# Patient Record
Sex: Female | Born: 1940 | Race: Black or African American | Hispanic: No | Smoking: Never smoker
Health system: Southern US, Community
[De-identification: ages and names within clinical notes are randomized; demographics above are authoritative.]

## PROBLEM LIST (undated history)

## (undated) DIAGNOSIS — I509 Heart failure, unspecified: Secondary | ICD-10-CM

## (undated) DIAGNOSIS — E785 Hyperlipidemia, unspecified: Secondary | ICD-10-CM

## (undated) DIAGNOSIS — C801 Malignant (primary) neoplasm, unspecified: Secondary | ICD-10-CM

## (undated) DIAGNOSIS — E1351 Other specified diabetes mellitus with diabetic peripheral angiopathy without gangrene: Secondary | ICD-10-CM

## (undated) DIAGNOSIS — F259 Schizoaffective disorder, unspecified: Secondary | ICD-10-CM

## (undated) DIAGNOSIS — I639 Cerebral infarction, unspecified: Secondary | ICD-10-CM

## (undated) DIAGNOSIS — I10 Essential (primary) hypertension: Secondary | ICD-10-CM

## (undated) DIAGNOSIS — E119 Type 2 diabetes mellitus without complications: Secondary | ICD-10-CM

## (undated) HISTORY — DX: Cerebral infarction, unspecified: I63.9

## (undated) HISTORY — DX: Schizoaffective disorder, unspecified: F25.9

## (undated) HISTORY — PX: CHOLECYSTECTOMY: SHX55

## (undated) HISTORY — DX: Other specified diabetes mellitus with diabetic peripheral angiopathy without gangrene: E13.51

## (undated) HISTORY — DX: Essential (primary) hypertension: I10

## (undated) HISTORY — DX: Hyperlipidemia, unspecified: E78.5

---
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
## 2017-08-20 ENCOUNTER — Other Ambulatory Visit: Payer: Self-pay | Admitting: Internal Medicine

## 2017-08-20 DIAGNOSIS — N6320 Unspecified lump in the left breast, unspecified quadrant: Secondary | ICD-10-CM

## 2017-08-30 ENCOUNTER — Other Ambulatory Visit: Payer: Self-pay | Admitting: Internal Medicine

## 2017-08-30 ENCOUNTER — Other Ambulatory Visit: Payer: Self-pay | Admitting: Nurse Practitioner

## 2017-08-30 DIAGNOSIS — N6320 Unspecified lump in the left breast, unspecified quadrant: Secondary | ICD-10-CM

## 2017-08-31 ENCOUNTER — Other Ambulatory Visit: Payer: Self-pay | Admitting: Internal Medicine

## 2017-08-31 ENCOUNTER — Ambulatory Visit
Admission: RE | Admit: 2017-08-31 | Discharge: 2017-08-31 | Disposition: A | Discharge status: Home / Self Care | Payer: Medicare Other | Source: Ambulatory Visit | Attending: Internal Medicine | Admitting: Internal Medicine

## 2017-08-31 DIAGNOSIS — N6320 Unspecified lump in the left breast, unspecified quadrant: Secondary | ICD-10-CM

## 2017-09-05 ENCOUNTER — Ambulatory Visit
Admission: RE | Admit: 2017-09-05 | Discharge: 2017-09-05 | Disposition: A | Discharge status: Home / Self Care | Payer: Medicare Other | Source: Ambulatory Visit | Attending: Internal Medicine | Admitting: Internal Medicine

## 2017-09-05 DIAGNOSIS — N6320 Unspecified lump in the left breast, unspecified quadrant: Secondary | ICD-10-CM

## 2017-09-07 ENCOUNTER — Encounter: Payer: Self-pay | Admitting: *Deleted

## 2017-09-07 ENCOUNTER — Telehealth: Payer: Self-pay | Admitting: Oncology

## 2017-09-07 DIAGNOSIS — Z170 Estrogen receptor positive status [ER+]: Principal | ICD-10-CM

## 2017-09-07 DIAGNOSIS — C50212 Malignant neoplasm of upper-inner quadrant of left female breast: Secondary | ICD-10-CM | POA: Insufficient documentation

## 2017-09-07 NOTE — Telephone Encounter (Signed)
Spoke with patient's daughter [name] to confirm afternoon [facility] appointment for 8/7, packet will be mailed and emailed

## 2017-09-11 NOTE — Progress Notes (Signed)
[place]  Telephone:([phone]) [phone] Fax:(336) [phone]     ID: Carolyn Parrish DOB: 02/26/1940  MR#: [id number]  LYY#:[id number]  Patient Care Team: Garwin Brothers, MD as PCP - General (Internal Medicine) Erroll Luna, MD as Consulting Physician (General Surgery) Kaytlin Burklow, Virgie Dad, MD as Consulting Physician (Oncology) Kyung Rudd, MD as Consulting Physician (Radiation Oncology) OTHER MD:  CHIEF COMPLAINT: Estrogen receptor positive breast cancer  CURRENT TREATMENT: Awaiting definitive surgery  HISTORY OF CURRENT ILLNESS: Carolyn Parrish was found to have a palpable mass in the left breast. She underwent bilateral diagnostic mammography with tomography and left breast ultrasonography at [facility] on 08/31/2017 showing: breast density category C. There was a suspicious mass in the left breast at the 9 o'clock retroareolar location involving the medial aspect of the nipple, measuring 2.8 x 1.6 x 1.0 cm. Sonographic evaluation of the left axilla demonstrates no suspicious lymphadenopathy. Limited mammographic evaluation of the right breast is unremarkable.  Accordingly on 09/05/2017 she proceeded to biopsy of the left breast area in question. The pathology from this procedure showed ([id number]): Invasive ductal carcinoma grade 2. Perineural invasion is identified. Prognostic indicators significant for: estrogen receptor, 100% positive and progesterone receptor, 80% positive, both with strong staining intensity. Proliferation marker Ki67 at 15%. HER2 not amplified with ratios HER2/CEP17 signals 1.70 and average HER2 copies per cell 4.75.   The patient's subsequent history is as detailed below.  INTERVAL HISTORY: Carolyn Parrish was evaluated in the multidisciplinary breast cancer clinic on 09/11/2017 accompanied by her daughter Carolyn Parrish. Her case was also presented at the multidisciplinary breast cancer conference on the same day. At that time a preliminary plan was  proposed: Mastectomy, with no sentinel lymph node sampling, and antiestrogens.  Adjuvant radiation was felt likely not feasible given the patient's physical constraints.   REVIEW OF SYSTEMS: The patient's daughter notes that Carolyn Parrish has diabetes and takes metformin.  The patient is wheelchair-bound.  The patient denies unusual headaches, visual changes, nausea, vomiting, stiff neck, dizziness, or gait imbalance. There has been no cough, phlegm production, or pleurisy, no chest pain or pressure, and no change in bowel or bladder habits. The patient denies fever, rash, bleeding, unexplained fatigue or unexplained weight loss. A detailed review of systems was otherwise entirely negative.   PAST MEDICAL HISTORY: Past Medical History:  Diagnosis Date  . Heart failure ([place])   . Hyperlipidemia   . Hypertension   . Schizoaffective disorder ([place])   . Stroke [facility])   Polyosteoarthrtis and generalized muscle weakness.  Diabetes- takes metformin  PAST SURGICAL HISTORY: No past surgical history on file.  Cholecystectomy 2016  FAMILY HISTORY History reviewed. No pertinent family history. The patient's father died of old age at 79. The patient's mother also died of old age at 24. The patient has 5 brothers and 6 sisters. The patient denies a family history of breast or ovarian cancer.  GYNECOLOGIC HISTORY:  No LMP recorded. Age at first live birth: 77 years old She is [place] P4 (the last was still born).  Her LMP was age [age]. She never took HRT or oral contraception.    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 The patient is widowed. The patient's daughter, Carolyn Parrish lives in [place], [place] and works as a Freight forwarder. The patient's son, Carolyn Parrish lives in [place], [place] and works as a Administrator. The patient's daughter, Carolyn Parrish lives in [place] and works as a Customer service manager. The  patient as 3 grandchildren. She is a Psychologist, forensic.      ADVANCED  DIRECTIVES: The patient's daughter, Carolyn Parrish is her HCPOA, and she can be reached at [phone]   HEALTH MAINTENANCE: Social History   Tobacco Use  . Smoking status: Never Smoker  Substance Use Topics  . Alcohol use: Not Currently  . Drug use: Never     Colonoscopy:   PAP:   Bone density:    Allergies  Allergen Reactions  . Atorvastatin     Current Outpatient Medications  Medication Sig Dispense Refill  . acetaminophen (TYLENOL) 325 MG tablet Take 650 mg by mouth every 4 (four) hours as needed (Arthritis pain). Give 2 tablets by mouth every 4 hours as needed for arthritis pain.    Marland Kitchen alum & mag hydroxide-simeth (MYLANTA) 200-200-20 MG/5ML suspension Take 30 mLs by mouth every 4 (four) hours as needed for indigestion or heartburn.    Marland Kitchen ascorbic acid (VITAMIN C) 500 MG tablet Take 500 mg by mouth 2 (two) times daily.    Marland Kitchen aspirin 81 MG chewable tablet Chew 81 mg by mouth daily.    . divalproex (DEPAKOTE ER) 250 MG 24 hr tablet Take 250 mg by mouth daily.    Marland Kitchen docusate sodium (COLACE) 100 MG capsule Take 100 mg by mouth 2 (two) times daily.    . furosemide (LASIX) 20 MG tablet Take 20 mg by mouth. Give 0.5 tablet by mouth one time a day.    . lisinopril (PRINIVIL,ZESTRIL) 2.5 MG tablet Take 2.5 mg by mouth daily.    . magnesium hydroxide (MILK OF MAGNESIA) 400 MG/5ML suspension Take 30 mLs by mouth daily as needed for mild constipation.    . metFORMIN (GLUCOPHAGE) 500 MG tablet Take 500 mg by mouth daily.    . polyethylene glycol (MIRALAX / GLYCOLAX) packet Take 17 g by mouth daily.    . vitamin B-12 (CYANOCOBALAMIN) 500 MCG tablet Take 500 mcg by mouth daily.    . Vitamin D, Ergocalciferol, (DRISDOL) 50000 units CAPS capsule Take 50,000 Units by mouth every 7 (seven) days.     No current facility-administered medications for this visit.     OBJECTIVE: Elderly African-American woman examined in a wheelchair  Vitals:   09/12/17 1244  BP: 102/71  Pulse: (!) 108  Resp: 18   Temp: 98.9 F (37.2 C)     There is no height or weight on file to calculate BMI.   Wt Readings from Last 3 Encounters:  No data found for Wt      ECOG FS:2 - Symptomatic, <50% confined to bed  Ocular: Sclerae unicteric, arcus senilis Lymphatic: No cervical or supraclavicular adenopathy Lungs no rales or rhonchi Heart regular rate and rhythm Abd soft, nontender, positive bowel sounds MSK no focal spinal tenderness, no joint edema Neuro: non-focal, well-oriented, appropriate affect Breasts: The right breast is unremarkable.  The left breast is status post recent biopsy.  I do not palpate a well-defined mass.  There are no skin or nipple changes of concern.  Both axillae are benign.   LAB RESULTS:  CMP     Component Value Date/Time   NA 142 09/12/2017 1132   K 4.3 09/12/2017 1132   CL 102 09/12/2017 1132   CO2 27 09/12/2017 1132   GLUCOSE 138 (H) 09/12/2017 1132   BUN 14 09/12/2017 1132   CREATININE 0.90 09/12/2017 1132   CALCIUM 10.1 09/12/2017 1132   PROT 8.4 (H) 09/12/2017 1132   ALBUMIN 3.7 09/12/2017 1132  AST 17 09/12/2017 1132   ALT 26 09/12/2017 1132   ALKPHOS 83 09/12/2017 1132   BILITOT 0.5 09/12/2017 1132   GFRNONAA >60 09/12/2017 1132   GFRAA >60 09/12/2017 1132    No results found for: TOTALPROTELP, ALBUMINELP, A1GS, A2GS, BETS, BETA2SER, GAMS, MSPIKE, SPEI  No results found for: KPAFRELGTCHN, LAMBDASER, KAPLAMBRATIO  Lab Results  Component Value Date   WBC 14.4 (H) 09/12/2017   NEUTROABS 9.2 (H) 09/12/2017   HGB 15.9 09/12/2017   HCT 50.1 (H) 09/12/2017   MCV 97.3 09/12/2017   PLT 326 09/12/2017    _0 @  No results found for: LABCA2  No components found for: YFVCBS496  No results for input(s): INR in the last 168 hours.  No results found for: LABCA2  No results found for: PRF163  No results found for: WGY659  No results found for: DJT701  No results found for: CA2729  No components found for: HGQUANT  No results  found for: CEA1 / No results found for: CEA1   No results found for: AFPTUMOR  No results found for: CHROMOGRNA  No results found for: PSA1  Appointment on 09/12/2017  Component Date Value Ref Range Status  . WBC Count 09/12/2017 14.4* 3.9 - 10.3 K/uL Final  . RBC 09/12/2017 5.15  3.70 - 5.45 MIL/uL Final  . Hemoglobin 09/12/2017 15.9  11.6 - 15.9 g/dL Final  . HCT 09/12/2017 50.1* 34.8 - 46.6 % Final  . MCV 09/12/2017 97.3  79.5 - 101.0 fL Final  . MCH 09/12/2017 30.9  25.1 - 34.0 pg Final  . MCHC 09/12/2017 31.7  31.5 - 36.0 g/dL Final  . RDW 09/12/2017 16.2* 11.2 - 14.5 % Final  . Platelet Count 09/12/2017 326  145 - 400 K/uL Final  . Neutrophils Relative % 09/12/2017 64  % Final  . Neutro Abs 09/12/2017 9.2* 1.5 - 6.5 K/uL Final  . Lymphocytes Relative 09/12/2017 26  % Final  . Lymphs Abs 09/12/2017 3.8* 0.9 - 3.3 K/uL Final  . Monocytes Relative 09/12/2017 9  % Final  . Monocytes Absolute 09/12/2017 1.3* 0.1 - 0.9 K/uL Final  . Eosinophils Relative 09/12/2017 1  % Final  . Eosinophils Absolute 09/12/2017 0.1  0.0 - 0.5 K/uL Final  . Basophils Relative 09/12/2017 0  % Final  . Basophils Absolute 09/12/2017 0.0  0.0 - 0.1 K/uL Final   Performed at [facility], [place] [address], [place], [place] [address]  . Sodium 09/12/2017 142  135 - 145 mmol/L Final  . Potassium 09/12/2017 4.3  3.5 - 5.1 mmol/L Final  . Chloride 09/12/2017 102  98 - 111 mmol/L Final  . CO2 09/12/2017 27  22 - 32 mmol/L Final  . Glucose, Bld 09/12/2017 138* 70 - 99 mg/dL Final  . BUN 09/12/2017 14  8 - 23 mg/dL Final  . Creatinine 09/12/2017 0.90  0.44 - 1.00 mg/dL Final  . Calcium 09/12/2017 10.1  8.9 - 10.3 mg/dL Final  . Total Protein 09/12/2017 8.4* 6.5 - 8.1 g/dL Final  . Albumin 09/12/2017 3.7  3.5 - 5.0 g/dL Final  . AST 09/12/2017 17  15 - 41 U/L Final  . ALT 09/12/2017 26  0 - 44 U/L Final  . Alkaline Phosphatase 09/12/2017 83  38 - 126 U/L Final  . Total Bilirubin  09/12/2017 0.5  0.3 - 1.2 mg/dL Final  . GFR, Est Non Af Am 09/12/2017 >60  >60 mL/min Final  . GFR, Est AFR Am 09/12/2017 >60  >  60 mL/min Final   Comment: (NOTE) The eGFR has been calculated using the CKD EPI equation. This calculation has not been validated in all clinical situations. eGFR's persistently <60 mL/min signify possible Chronic Kidney Disease.   Georgiann Hahn gap 09/12/2017 13  5 - 15 Final   Performed at [facility], [place] [address], [place], [place] [address]    (this displays the last labs from the last 3 days)  No results found for: TOTALPROTELP, ALBUMINELP, A1GS, A2GS, BETS, BETA2SER, GAMS, MSPIKE, SPEI (this displays SPEP labs)  No results found for: KPAFRELGTCHN, LAMBDASER, KAPLAMBRATIO (kappa/lambda light chains)  No results found for: HGBA, HGBA2QUANT, HGBFQUANT, HGBSQUAN (Hemoglobinopathy evaluation)   No results found for: LDH  No results found for: IRON, TIBC, IRONPCTSAT (Iron and TIBC)  No results found for: FERRITIN  Urinalysis No results found for: COLORURINE, APPEARANCEUR, LABSPEC, PHURINE, GLUCOSEU, HGBUR, BILIRUBINUR, KETONESUR, PROTEINUR, UROBILINOGEN, NITRITE, LEUKOCYTESUR   STUDIES: US Breast Ltd Uni Left Inc Axilla  Result Date: 08/31/2017 CLINICAL DATA:  77 year old female with a palpable left breast abnormality. Of note the patient has markedly limited mobility status post stroke and is wheelchair bound. The best possible images were obtained. EXAM: DIGITAL DIAGNOSTIC BILATERAL MAMMOGRAM WITH CAD AND TOMO ULTRASOUND LEFT BREAST COMPARISON:  Previous exam(s). ACR Breast Density Category c: The breast tissue is heterogeneously dense, which may obscure small masses. FINDINGS: Limited bilateral MLO images were obtained. CC images could not be performed due to the patient's physical limitations and body habitus. A spiculated hyperdense mass is identified in the superficial subareolar left breast. It localizes medially on  tomosynthesis and demonstrates nipple in skin involvement. Limited evaluation of the right breast is unremarkable. Mammographic images were processed with CAD. On physical exam, I palpate a hard, immobile 2-3 cm mass in the medial periareolar region. The lump is visible within the skin and appears to involve the nipple. Targeted ultrasound is performed, showing an irregular hypoechoic mass with associated vascularity at the 9 o'clock retroareolar region. It measures approximately 2.8 x 1.6 x 1.0 cm. This mass abuts the overlying skin surface and involves the medial aspect of the nipple. Evaluation of the left axilla demonstrates no suspicious lymphadenopathy. IMPRESSION: 1. Suspicious left breast mass involving the superficial periareolar tissues and medial aspect of the left nipple. Recommendation is for ultrasound-guided biopsy. 2. No suspicious left axillary lymphadenopathy. 3. Limited mammographic evaluation of the right breast is grossly unremarkable. RECOMMENDATION: Ultrasound-guided biopsy for a suspicious periareolar left breast mass. I have discussed the findings and recommendations with the patient. Results were also provided in writing at the conclusion of the visit. If applicable, a reminder letter will be sent to the patient regarding the next appointment. BI-RADS CATEGORY  4: Suspicious. Electronically Signed   By: Kristopher Oppenheim M.D.   On: 08/31/2017 11:49   Mm Diag Breast Tomo Bilateral  Result Date: 08/31/2017 CLINICAL DATA:  77 year old female with a palpable left breast abnormality. Of note the patient has markedly limited mobility status post stroke and is wheelchair bound. The best possible images were obtained. EXAM: DIGITAL DIAGNOSTIC BILATERAL MAMMOGRAM WITH CAD AND TOMO ULTRASOUND LEFT BREAST COMPARISON:  Previous exam(s). ACR Breast Density Category c: The breast tissue is heterogeneously dense, which may obscure small masses. FINDINGS: Limited bilateral MLO images were obtained. CC  images could not be performed due to the patient's physical limitations and body habitus. A spiculated hyperdense mass is identified in the superficial subareolar left breast. It localizes medially on tomosynthesis and demonstrates  nipple in skin involvement. Limited evaluation of the right breast is unremarkable. Mammographic images were processed with CAD. On physical exam, I palpate a hard, immobile 2-3 cm mass in the medial periareolar region. The lump is visible within the skin and appears to involve the nipple. Targeted ultrasound is performed, showing an irregular hypoechoic mass with associated vascularity at the 9 o'clock retroareolar region. It measures approximately 2.8 x 1.6 x 1.0 cm. This mass abuts the overlying skin surface and involves the medial aspect of the nipple. Evaluation of the left axilla demonstrates no suspicious lymphadenopathy. IMPRESSION: 1. Suspicious left breast mass involving the superficial periareolar tissues and medial aspect of the left nipple. Recommendation is for ultrasound-guided biopsy. 2. No suspicious left axillary lymphadenopathy. 3. Limited mammographic evaluation of the right breast is grossly unremarkable. RECOMMENDATION: Ultrasound-guided biopsy for a suspicious periareolar left breast mass. I have discussed the findings and recommendations with the patient. Results were also provided in writing at the conclusion of the visit. If applicable, a reminder letter will be sent to the patient regarding the next appointment. BI-RADS CATEGORY  4: Suspicious. Electronically Signed   By: Kristopher Oppenheim M.D.   On: 08/31/2017 11:49   [place] Lt Breast Bx W Loc Dev 1st Lesion Img Bx Spec US Guide  Addendum Date: 09/06/2017   ADDENDUM REPORT: 09/06/2017 12:19 ADDENDUM: Pathology revealed GRADE II INVASIVE DUCTAL CARCINOMA, PERINEURAL INVASION IS IDENTIFIED of LEFT breast, retroareolar 9 o'clock axis. This was found to be concordant by Dr. Franki Cabot. Pathology results were  discussed with the patient's daughter and Power of Doyce Para, by telephone. Kennith Gain reported the patient did well after the biopsy with tenderness at the site. Post biopsy instructions and care were reviewed and questions were answered. Kennith Gain was encouraged to call [facility] for any additional concerns. The patient was referred to [facility] on September 12, 2017. Pathology results reported by Roselind Messier, RN on 09/06/2017. Electronically Signed   By: Franki Cabot M.D.   On: 09/06/2017 12:19   Result Date: 09/06/2017 CLINICAL DATA:  Patient with a retroareolar LEFT breast mass presents today for ultrasound-guided core biopsy. EXAM: ULTRASOUND GUIDED LEFT BREAST CORE NEEDLE BIOPSY COMPARISON:  Previous exam(s). PROCEDURE: I met with the patient and we discussed the procedure of ultrasound-guided biopsy, including benefits and alternatives. We discussed the high likelihood of a successful procedure. We discussed the risks of the procedure including infection, bleeding, tissue injury, clip migration, and inadequate sampling. Informed written consent was given. The usual time-out protocol was performed immediately prior to the procedure. Lesion quadrant: Retroareolar 9 o'clock axis. Using sterile technique and 1% Lidocaine as local anesthetic, under direct ultrasound visualization, a 14 gauge spring-loaded device was used to perform biopsy of the retroareolar LEFT breast mass, 9 o'clock axis,using a inferior approach. At the conclusion of the procedure, a ribbon shaped tissue marker clip was deployed into the biopsy cavity. Postprocedure ultrasound shows the clip well-positioned within the retroareolar mass. IMPRESSION: Ultrasound-guided biopsy of the retroareolar LEFT breast mass. No apparent complications. Electronically Signed: By: Franki Cabot M.D. On: 09/05/2017 13:15    ELIGIBLE FOR  AVAILABLE RESEARCH PROTOCOL: no  ASSESSMENT: 77 y.o. [place] skilled nursing facility resident status post left breast upper outer quadrant biopsy 09/05/2017 for a clinical T2N0, stage Ib invasive ductal carcinoma, grade 2, estrogen and progesterone receptor positive, HER-2 not amplified, with an MIB-1 of 15%.  PLAN: We spent  more than 50% of today's hour-long appointment in counseling and coordination of care regarding the biology of the patient's diagnosis and the specifics of her situation. We first reviewed the fact that cancer is not one disease but more than 100 different diseases and that it is important to keep them separate-- otherwise when friends and relatives discuss their own cancer experiences with Rayme confusion can result. Similarly we explained that if breast cancer spreads to the bone or liver, the patient would not have bone cancer or liver cancer, but breast cancer in the bone and breast cancer in the liver: one cancer in three places-- not 3 different cancers which otherwise would have to be treated in 3 different ways.  We discussed the difference between local and systemic therapy. In terms of loco-regional treatment, lumpectomy plus radiation is equivalent to mastectomy as far as survival is concerned.  However, in the lowest his case, given particularly the difficulty with screening mammography, simple mastectomy is recommended.  Next we went over the options for systemic therapy which are anti-estrogens, anti-HER-2 immunotherapy, and chemotherapy. Nuha does not meet criteria for anti-HER-2 immunotherapy. She is a good candidate for anti-estrogens.  The question of chemotherapy is more complicated. Chemotherapy is most effective in rapidly growing, aggressive tumors. It is much less effective in not high-grade, slow growing cancers, like Taesha 's. For that reason and in view of the patient's age and comorbidities we are going to forego Oncotype testing and proceed from  surgery to adjuvant antiestrogens.  The patient will return to see me once she recovers from surgery and we will plan to start anastrozole at that time.  Marlina Cataldi, Virgie Dad, MD  09/12/17 4:09 PM Medical Oncology and Hematology [facility] [address] [place], [place] [address] Tel. [phone]    Fax. [phone]  Alice Rieger, am acting as scribe for Chauncey Cruel MD.  I, Lurline Del MD, have reviewed the above documentation for accuracy and completeness, and I agree with the above.

## 2017-09-12 ENCOUNTER — Encounter: Payer: Self-pay | Admitting: Radiation Oncology

## 2017-09-12 ENCOUNTER — Ambulatory Visit: Payer: Medicare Other | Admitting: Physical Therapy

## 2017-09-12 ENCOUNTER — Ambulatory Visit
Admission: RE | Admit: 2017-09-12 | Discharge: 2017-09-12 | Disposition: A | Discharge status: Home / Self Care | Payer: Medicare Other | Source: Ambulatory Visit | Attending: Radiation Oncology | Admitting: Radiation Oncology

## 2017-09-12 ENCOUNTER — Inpatient Hospital Stay: Payer: Medicare Other | Attending: Oncology | Admitting: Oncology

## 2017-09-12 ENCOUNTER — Inpatient Hospital Stay: Payer: Medicare Other

## 2017-09-12 ENCOUNTER — Ambulatory Visit: Payer: Self-pay | Admitting: Surgery

## 2017-09-12 ENCOUNTER — Encounter: Payer: Self-pay | Admitting: Oncology

## 2017-09-12 VITALS — BP 102/71 | HR 108 | Temp 98.9°F | Resp 18 | Ht 70.0 in

## 2017-09-12 DIAGNOSIS — Z8673 Personal history of transient ischemic attack (TIA), and cerebral infarction without residual deficits: Secondary | ICD-10-CM | POA: Diagnosis not present

## 2017-09-12 DIAGNOSIS — C50212 Malignant neoplasm of upper-inner quadrant of left female breast: Secondary | ICD-10-CM

## 2017-09-12 DIAGNOSIS — Z452 Encounter for adjustment and management of vascular access device: Secondary | ICD-10-CM | POA: Insufficient documentation

## 2017-09-12 DIAGNOSIS — C50112 Malignant neoplasm of central portion of left female breast: Secondary | ICD-10-CM | POA: Insufficient documentation

## 2017-09-12 DIAGNOSIS — F259 Schizoaffective disorder, unspecified: Secondary | ICD-10-CM | POA: Diagnosis not present

## 2017-09-12 DIAGNOSIS — I10 Essential (primary) hypertension: Secondary | ICD-10-CM

## 2017-09-12 DIAGNOSIS — I6359 Cerebral infarction due to unspecified occlusion or stenosis of other cerebral artery: Secondary | ICD-10-CM | POA: Diagnosis not present

## 2017-09-12 DIAGNOSIS — E1151 Type 2 diabetes mellitus with diabetic peripheral angiopathy without gangrene: Secondary | ICD-10-CM | POA: Insufficient documentation

## 2017-09-12 DIAGNOSIS — I110 Hypertensive heart disease with heart failure: Secondary | ICD-10-CM | POA: Diagnosis not present

## 2017-09-12 DIAGNOSIS — Z170 Estrogen receptor positive status [ER+]: Secondary | ICD-10-CM | POA: Diagnosis not present

## 2017-09-12 DIAGNOSIS — Z79899 Other long term (current) drug therapy: Secondary | ICD-10-CM | POA: Insufficient documentation

## 2017-09-12 DIAGNOSIS — E119 Type 2 diabetes mellitus without complications: Secondary | ICD-10-CM | POA: Diagnosis not present

## 2017-09-12 DIAGNOSIS — Z993 Dependence on wheelchair: Secondary | ICD-10-CM | POA: Diagnosis not present

## 2017-09-12 DIAGNOSIS — F258 Other schizoaffective disorders: Secondary | ICD-10-CM | POA: Diagnosis not present

## 2017-09-12 DIAGNOSIS — Z7982 Long term (current) use of aspirin: Secondary | ICD-10-CM | POA: Insufficient documentation

## 2017-09-12 DIAGNOSIS — Z7189 Other specified counseling: Secondary | ICD-10-CM

## 2017-09-12 DIAGNOSIS — I639 Cerebral infarction, unspecified: Secondary | ICD-10-CM | POA: Insufficient documentation

## 2017-09-12 LAB — CBC WITH DIFFERENTIAL (CANCER CENTER ONLY)
BASOS PCT: 0 %
Basophils Absolute: 0 10*3/uL (ref 0.0–0.1)
EOS PCT: 1 %
Eosinophils Absolute: 0.1 10*3/uL (ref 0.0–0.5)
HCT: 50.1 % — ABNORMAL HIGH (ref 34.8–46.6)
HEMOGLOBIN: 15.9 g/dL (ref 11.6–15.9)
Lymphocytes Relative: 26 %
Lymphs Abs: 3.8 10*3/uL — ABNORMAL HIGH (ref 0.9–3.3)
MCH: 30.9 pg (ref 25.1–34.0)
MCHC: 31.7 g/dL (ref 31.5–36.0)
MCV: 97.3 fL (ref 79.5–101.0)
MONOS PCT: 9 %
Monocytes Absolute: 1.3 10*3/uL — ABNORMAL HIGH (ref 0.1–0.9)
NEUTROS PCT: 64 %
Neutro Abs: 9.2 10*3/uL — ABNORMAL HIGH (ref 1.5–6.5)
PLATELETS: 326 10*3/uL (ref 145–400)
RBC: 5.15 MIL/uL (ref 3.70–5.45)
RDW: 16.2 % — ABNORMAL HIGH (ref 11.2–14.5)
WBC: 14.4 10*3/uL — AB (ref 3.9–10.3)

## 2017-09-12 LAB — CMP (CANCER CENTER ONLY)
ALBUMIN: 3.7 g/dL (ref 3.5–5.0)
ALT: 26 U/L (ref 0–44)
AST: 17 U/L (ref 15–41)
Alkaline Phosphatase: 83 U/L (ref 38–126)
Anion gap: 13 (ref 5–15)
BILIRUBIN TOTAL: 0.5 mg/dL (ref 0.3–1.2)
BUN: 14 mg/dL (ref 8–23)
CALCIUM: 10.1 mg/dL (ref 8.9–10.3)
CO2: 27 mmol/L (ref 22–32)
CREATININE: 0.9 mg/dL (ref 0.44–1.00)
Chloride: 102 mmol/L (ref 98–111)
GFR, Est AFR Am: >60 mL/min (ref >60)
Glucose, Bld: 138 mg/dL — ABNORMAL HIGH (ref 70–99)
Potassium: 4.3 mmol/L (ref 3.5–5.1)
SODIUM: 142 mmol/L (ref 135–145)
TOTAL PROTEIN: 8.4 g/dL — AB (ref 6.5–8.1)

## 2017-09-12 NOTE — H&P (Signed)
elois Parrish Carolyn Documented: 09/12/2017 7:37 AM Location: [facility] Patient #: [id number] DOB: [birth year] Undefined / Language: [name] / Race: Black or African American Female  History of Present Illness Carolyn Moores A. Shaden Higley MD; 09/12/2017 4:44 PM) Patient words: Pt here at the request ofthe [facility] for left breast mass core bx to be IDC ER / PR = her 2 neu negative    CLINICAL DATA: 77 year old female with a palpable left breast abnormality. Of note the patient has markedly limited mobility status post stroke and is wheelchair bound. The best possible images were obtained.  EXAM: DIGITAL DIAGNOSTIC BILATERAL MAMMOGRAM WITH CAD AND TOMO  ULTRASOUND LEFT BREAST  COMPARISON: Previous exam(s).  ACR Breast Density Category c: The breast tissue is heterogeneously dense, which may obscure small masses.  FINDINGS: Limited bilateral MLO images were obtained. CC images could not be performed due to the patient's physical limitations and body habitus. A spiculated hyperdense mass is identified in the superficial subareolar left breast. It localizes medially on tomosynthesis and demonstrates nipple in skin involvement.  Limited evaluation of the right breast is unremarkable.  Mammographic images were processed with CAD.  On physical exam, I palpate a hard, immobile 2-3 cm mass in the medial periareolar region. The lump is visible within the skin and appears to involve the nipple.  Targeted ultrasound is performed, showing an irregular hypoechoic mass with associated vascularity at the 9 o'clock retroareolar region. It measures approximately 2.8 x 1.6 x 1.0 cm. This mass abuts the overlying skin surface and involves the medial aspect of the nipple.  Evaluation of the left axilla demonstrates no suspicious lymphadenopathy.  IMPRESSION: 1. Suspicious left breast mass involving the superficial periareolar tissues and medial aspect of the left nipple. Recommendation is  for ultrasound-guided biopsy. 2. No suspicious left axillary lymphadenopathy. 3. Limited mammographic evaluation of the right breast is grossly unremarkable.  RECOMMENDATION: Ultrasound-guided biopsy for a suspicious periareolar left breast mass.  I have discussed the findings and recommendations with the patient. Results were also provided in writing at the conclusion of the visit. If applicable, a reminder letter will be sent to the patient regarding the next appointment.  BI-RADS CATEGORY 4: Suspicious.   Electronically Signed By: Kristopher Oppenheim M.Parrish. 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 HER2 FISH was performed by a technologist and cell imaging and analysis on the [employer]. First Analysis: RATIO OF HER2/CEP17 SIGNALS 1.70 AVERAGE HER2 COPY NUMBER PER CELL 4.75 An additional 20 cells was analyzed by FISH in a separate region with invasive cancer. Additional Analysis: RATIO OF HER2/CEP17 SIGNALS 1.89 AVERAGE HER2 COPY NUMBER PER CELL 4.93 The ratio of HER-2 CEP 17 is within the range < 2.0 of HER-2: CEP17 in normal breast tissue and a copy number of HER2 signals per cell is >=4.0 and < 6.0. Arch Pathol Lab Med 1:1,2018 COMMENT: It is uncertain whether patients with >=4.0 and < 6.0 average HER2 signals/cell and HER2/CEP17 ratio < 2.0 benefit from HER2 targeted therapy in the absence of protein overexpression (IHC 3+). If the specimen test result is close to the ISH ratio threshold for positive, there is a high likelihood that repeat testing will result in different results by chance alone. Therefore, when [facility] results are not 3+ positive, it is recommended  that the sample be considered HER2 negative without additional testing on the same  specimen. Thressa Sheller MD Pathologist, Electronic Signature ( Signed 09/11/2017) By immunohistochemistry, the tumor cells are equivocal for Her2 (2+). Her2 FISH has been ordered. 1 of 3 FINAL for Carolyn Parrish, Carolyn Parrish ([id number]) ADDITIONAL INFORMATION:(continued) Vicente Males MD Pathologist, Electronic Signature ( Signed 09/11/2017) PROGNOSTIC INDICATORS Results: IMMUNOHISTOCHEMICAL AND MORPHOMETRIC ANALYSIS PERFORMED MANUALLY Estrogen Receptor: 100%, POSITIVE, STRONG STAINING INTENSITY Progesterone Receptor: 80%, POSITIVE, STRONG STAINING INTENSITY Proliferation Marker Ki67: 15% REFERENCE RANGE ESTROGEN RECEPTOR NEGATIVE 0% POSITIVE =>1% REFERENCE RANGE PROGESTERONE RECEPTOR NEGATIVE 0% POSITIVE =>1% All controls stained appropriately Thressa Sheller MD Pathologist, Electronic Signature ( Signed 09/10/2017) FINAL DIAGNOSIS Diagnosis Breast, left, needle core biopsy, 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 - SEE COMMENT. Microscopic Comment The carcinoma appears grade II. A breast prognostic profile will be performed and the results reported separately. The results were called to the [facility] on 09/06/17. (JBK:kh 09/06/17) Enid Cutter MD Pathologist, Electronic Signature (Case signed 09/06/2017) Specimen Gross and Clinical Information Specimen Comment In formalin 1:10, extracted less than 1 minute, left breast mass, likely involving overlying skin/nipple 2 of 3 FINAL for Carolyn Parrish, Carolyn Parrish ([id number].  The patient is a 77 year old female.   Past Surgical History Tawni Pummel, RN; 09/12/2017 7:37 AM) Breast Biopsy Left.  Diagnostic Studies History Tawni Pummel, RN; 09/12/2017 7:37 AM) Mammogram within last year Pap Smear >5 years ago  Medication History Tawni Pummel, RN; 09/12/2017 7:38 AM) Medications Reconciled  Social History Tawni Pummel, RN; 09/12/2017 7:37 AM) Caffeine use Tea. No drug  use  Family History Tawni Pummel, RN; 09/12/2017 7:37 AM) Family history unknown First Degree Relatives  Pregnancy / Birth History Tawni Pummel, RN; 09/12/2017 7:37 AM) [name]  Other Problems Tawni Pummel, RN; 09/12/2017 7:37 AM) Cholelithiasis High blood pressure     Review of Systems (Carolyn Parrish A. Mayce Noyes MD; 09/12/2017 4:40 PM) General Present- Appetite Loss. Not Present- Chills, Fatigue, Fever, Night Sweats, Weight Gain and Weight Loss. Skin Not Present- Change in Wart/Mole, Dryness, Hives, Jaundice, New Lesions, Non-Healing Wounds, Rash and Ulcer. HEENT Not Present- Earache, Hearing Loss, Hoarseness, Nose Bleed, Oral Ulcers, Ringing in the Ears, Seasonal Allergies, Sinus Pain, Sore Throat, Visual Disturbances, Wears glasses/contact lenses and Yellow Eyes. Respiratory Present- Chronic Cough. Not Present- Bloody sputum, Difficulty Breathing, Snoring and Wheezing. Breast Not Present- Breast Mass, Breast Pain, Nipple Discharge and Skin Changes. Gastrointestinal Present- Chronic diarrhea. Not Present- Abdominal Pain, Bloating, Bloody Stool, Change in Bowel Habits, Constipation, Difficulty Swallowing, Excessive gas, Gets full quickly at meals, Hemorrhoids, Indigestion, Nausea, Rectal Pain and Vomiting. Musculoskeletal Present- Muscle Weakness. Not Present- Back Pain, Joint Pain, Joint Stiffness, Muscle Pain and Swelling of Extremities. Neurological Present- Decreased Memory and Trouble walking. Not Present- Fainting, Headaches, Numbness, Seizures, Tingling, Tremor and Weakness. Psychiatric Present- Bipolar. Not Present- Anxiety, Change in Sleep Pattern, Depression, Fearful and Frequent crying. Endocrine Present- Hot flashes. Not Present- Cold Intolerance, Excessive Hunger, Hair Changes, Heat Intolerance and New Diabetes. Hematology Not Present- Blood Thinners, Easy Bruising, Excessive bleeding, Gland problems, HIV and Persistent Infections. All other systems negative   Physical  Exam (Charmon Thorson A. Coalton Arch MD; 09/12/2017 4:42 PM)  General Mental Status-Alert. General Appearance-Consistent with stated age. Hydration-Well hydrated. Voice-Normal. Note: wheel chair bound  Head and Neck Head-normocephalic, atraumatic with no lesions or palpable masses. Trachea-midline. Thyroid Gland Characteristics - normal size and consistency.  Eye Eyeball - Bilateral-Extraocular movements intact. Sclera/Conjunctiva - Bilateral-No scleral  icterus.  Chest and Lung Exam Chest and lung exam reveals -quiet, even and easy respiratory effort with no use of accessory muscles and on auscultation, normal breath sounds, no adventitious sounds and normal vocal resonance. Inspection Chest Wall - Normal. Back - normal.  Breast Note: large left central breast mass 3 cm no fixed right breast normal  Cardiovascular Cardiovascular examination reveals -normal heart sounds, regular rate and rhythm with no murmurs and normal pedal pulses bilaterally.  Musculoskeletal Normal Exam - Left-Upper Extremity Strength Normal and Lower Extremity Strength Normal. Normal Exam - Right-Upper Extremity Strength Normal and Lower Extremity Strength Normal.  Lymphatic Head & Neck  General Head & Neck Lymphatics: Bilateral - Description - Normal. Axillary  General Axillary Region: Bilateral - Description - Normal. Tenderness - Non Tender.    Assessment & Plan (Sheyanne Munley A. Krist Rosenboom MD; 09/12/2017 4:43 PM)  BREAST CANCER, RIGHT (C50.911) Impression: pt not a good radiation candidate recommed left mastectomy simple   Discussed treatment options for breast cancer to include breast conservation vs mastectomy with reconstruction. Pt has decided on mastectomy. Risk include bleeding, infection, flap necrosis, pain, numbness, recurrence, hematoma, other surgery needs. Pt understands and agrees to proceed.  Current Plans You are being scheduled for surgery- Our schedulers will call  you.  You should hear from our office's scheduling department within 5 working days about the location, date, and time of surgery. We try to make accommodations for patient's preferences in scheduling surgery, but sometimes the OR schedule or the surgeon's schedule prevents [place] from making those accommodations.  If you have not heard from our office [phone]) in 5 working days, call the office and ask for your surgeon's nurse.  If you have other questions about your diagnosis, plan, or surgery, call the office and ask for your surgeon's nurse.  Pt Education - [facility] Breast Cancer Information Given - [facility] "Breast Journey" Package Pt Education - [facility] Mastectomy HCI

## 2017-09-12 NOTE — Progress Notes (Signed)
Radiation Oncology         [phone] ________________________________  Name: Carolyn Parrish        MRN: [id number]  Date of Service: 09/12/2017 DOB: Apr 18, 1940  XJ:[id number], Marlene Lard, MD  Erroll Luna, MD     REFERRING PHYSICIAN: Erroll Luna, MD   DIAGNOSIS: The encounter diagnosis was Malignant neoplasm of upper-inner quadrant of left breast in female, estrogen receptor positive ([place]).   HISTORY OF PRESENT ILLNESS: Carolyn Parrish is a 77 y.o. female seen in the multidisciplinary breast clinic for a new diagnosis of left breast cancer. The patient was noted to have a palpable mass in the left breast and has a history of prior CVA and has been in an SNF since 1996 as a result of CVA history. She had a mass on diagnostic ultrasound that measured 2.8 x 1. 6 x 1 cm at the 9:00 position along the upper inner quadrant. She did not have any axillary adenopathy. 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 She comes today to discuss treatment options for her cancer.    PREVIOUS RADIATION THERAPY: No   PAST MEDICAL HISTORY:  Past Medical History:  Diagnosis Date  . Heart failure ([place])   . Hyperlipidemia   . Hypertension   . Schizoaffective disorder ([place])   . Stroke [facility])        PAST SURGICAL HISTORY:History reviewed. No pertinent surgical history.   FAMILY HISTORY: History reviewed. No pertinent family history.   SOCIAL HISTORY:  reports that she has never smoked. She does not have any smokeless tobacco history on file. She reports that she drank alcohol. She reports that she does not use drugs. The patient is widowed and her daughter is her HCPOA and accompanies her today.    ALLERGIES: Atorvastatin   MEDICATIONS:  Current Outpatient Medications  Medication Sig Dispense Refill  . acetaminophen (TYLENOL) 325 MG tablet Take 650 mg by mouth every 4 (four)  hours as needed (Arthritis pain). Give 2 tablets by mouth every 4 hours as needed for arthritis pain.    Marland Kitchen alum & mag hydroxide-simeth (MYLANTA) 200-200-20 MG/5ML suspension Take 30 mLs by mouth every 4 (four) hours as needed for indigestion or heartburn.    Marland Kitchen ascorbic acid (VITAMIN C) 500 MG tablet Take 500 mg by mouth 2 (two) times daily.    Marland Kitchen aspirin 81 MG chewable tablet Chew 81 mg by mouth daily.    . divalproex (DEPAKOTE ER) 250 MG 24 hr tablet Take 250 mg by mouth daily.    Marland Kitchen docusate sodium (COLACE) 100 MG capsule Take 100 mg by mouth 2 (two) times daily.    . furosemide (LASIX) 20 MG tablet Take 20 mg by mouth. Give 0.5 tablet by mouth one time a day.    . lisinopril (PRINIVIL,ZESTRIL) 2.5 MG tablet Take 2.5 mg by mouth daily.    . magnesium hydroxide (MILK OF MAGNESIA) 400 MG/5ML suspension Take 30 mLs by mouth daily as needed for mild constipation.    . metFORMIN (GLUCOPHAGE) 500 MG tablet Take 500 mg by mouth daily.    . polyethylene glycol (MIRALAX / GLYCOLAX) packet Take 17 g by mouth daily.    . vitamin B-12 (CYANOCOBALAMIN) 500 MCG tablet Take 500 mcg by mouth daily.    . Vitamin D, Ergocalciferol, (DRISDOL) 50000 units CAPS capsule Take 50,000 Units by mouth every 7 (seven) days.  No current facility-administered medications for this encounter.      REVIEW OF SYSTEMS: On review of systems, the patient reports that she is doing well overall. No complaints are verbalized.     PHYSICAL EXAM:  Wt Readings from Last 3 Encounters:  No data found for Wt   Temp Readings from Last 3 Encounters:  09/12/17 98.9 F (37.2 C) (Oral)   BP Readings from Last 3 Encounters:  09/12/17 102/71   Pulse Readings from Last 3 Encounters:  09/12/17 (!) 108     In general this is a well appearing African American female in no acute distress. She is alert and oriented x4 and appropriate throughout the examination. HEENT reveals that the patient is normocephalic, atraumatic. EOMs are  intact. Skin is intact without any evidence of gross lesions. Cardiopulmonary assessment is negative for acute distress and she exhibits normal effort.    ECOG = 1  0 - Asymptomatic (Fully active, able to carry on all predisease activities without restriction)  1 - Symptomatic but completely ambulatory (Restricted in physically strenuous activity but ambulatory and able to carry out work of a light or sedentary nature. For example, light housework, office work)  2 - Symptomatic, <50% in bed during the day (Ambulatory and capable of all self care but unable to carry out any work activities. Up and about more than 50% of waking hours)  3 - Symptomatic, >50% in bed, but not bedbound (Capable of only limited self-care, confined to bed or chair 50% or more of waking hours)  4 - Bedbound (Completely disabled. Cannot carry on any self-care. Totally confined to bed or chair)  5 - Death   Eustace Pen MM, Creech RH, Tormey DC, et al. [phone]). "Toxicity and response criteria of the [facility]". [place] Oncol. 5 (6): 649-55    LABORATORY DATA:  Lab Results  Component Value Date   WBC 14.4 (H) 09/12/2017   HGB 15.9 09/12/2017   HCT 50.1 (H) 09/12/2017   MCV 97.3 09/12/2017   PLT 326 09/12/2017   Lab Results  Component Value Date   NA 142 09/12/2017   K 4.3 09/12/2017   CL 102 09/12/2017   CO2 27 09/12/2017   Lab Results  Component Value Date   ALT 26 09/12/2017   AST 17 09/12/2017   ALKPHOS 83 09/12/2017   BILITOT 0.5 09/12/2017      RADIOGRAPHY: US Breast Ltd Uni Left Inc Axilla  Result Date: 08/31/2017 CLINICAL DATA:  77 year old female with a palpable left breast abnormality. Of note the patient has markedly limited mobility status post stroke and is wheelchair bound. The best possible images were obtained. EXAM: DIGITAL DIAGNOSTIC BILATERAL MAMMOGRAM WITH CAD AND TOMO ULTRASOUND LEFT BREAST COMPARISON:  Previous exam(s). ACR Breast Density Category c: The  breast tissue is heterogeneously dense, which may obscure small masses. FINDINGS: Limited bilateral MLO images were obtained. CC images could not be performed due to the patient's physical limitations and body habitus. A spiculated hyperdense mass is identified in the superficial subareolar left breast. It localizes medially on tomosynthesis and demonstrates nipple in skin involvement. Limited evaluation of the right breast is unremarkable. Mammographic images were processed with CAD. On physical exam, I palpate a hard, immobile 2-3 cm mass in the medial periareolar region. The lump is visible within the skin and appears to involve the nipple. Targeted ultrasound is performed, showing an irregular hypoechoic mass with associated vascularity at the 9 o'clock retroareolar region. It measures approximately 2.8  x 1.6 x 1.0 cm. This mass abuts the overlying skin surface and involves the medial aspect of the nipple. Evaluation of the left axilla demonstrates no suspicious lymphadenopathy. IMPRESSION: 1. Suspicious left breast mass involving the superficial periareolar tissues and medial aspect of the left nipple. Recommendation is for ultrasound-guided biopsy. 2. No suspicious left axillary lymphadenopathy. 3. Limited mammographic evaluation of the right breast is grossly unremarkable. RECOMMENDATION: Ultrasound-guided biopsy for a suspicious periareolar left breast mass. I have discussed the findings and recommendations with the patient. Results were also provided in writing at the conclusion of the visit. If applicable, a reminder letter will be sent to the patient regarding the next appointment. BI-RADS CATEGORY  4: Suspicious. Electronically Signed   By: Kristopher Oppenheim M.D.   On: 08/31/2017 11:49   Mm Diag Breast Tomo Bilateral  Result Date: 08/31/2017 CLINICAL DATA:  77 year old female with a palpable left breast abnormality. Of note the patient has markedly limited mobility status post stroke and is wheelchair  bound. The best possible images were obtained. EXAM: DIGITAL DIAGNOSTIC BILATERAL MAMMOGRAM WITH CAD AND TOMO ULTRASOUND LEFT BREAST COMPARISON:  Previous exam(s). ACR Breast Density Category c: The breast tissue is heterogeneously dense, which may obscure small masses. FINDINGS: Limited bilateral MLO images were obtained. CC images could not be performed due to the patient's physical limitations and body habitus. A spiculated hyperdense mass is identified in the superficial subareolar left breast. It localizes medially on tomosynthesis and demonstrates nipple in skin involvement. Limited evaluation of the right breast is unremarkable. Mammographic images were processed with CAD. On physical exam, I palpate a hard, immobile 2-3 cm mass in the medial periareolar region. The lump is visible within the skin and appears to involve the nipple. Targeted ultrasound is performed, showing an irregular hypoechoic mass with associated vascularity at the 9 o'clock retroareolar region. It measures approximately 2.8 x 1.6 x 1.0 cm. This mass abuts the overlying skin surface and involves the medial aspect of the nipple. Evaluation of the left axilla demonstrates no suspicious lymphadenopathy. IMPRESSION: 1. Suspicious left breast mass involving the superficial periareolar tissues and medial aspect of the left nipple. Recommendation is for ultrasound-guided biopsy. 2. No suspicious left axillary lymphadenopathy. 3. Limited mammographic evaluation of the right breast is grossly unremarkable. RECOMMENDATION: Ultrasound-guided biopsy for a suspicious periareolar left breast mass. I have discussed the findings and recommendations with the patient. Results were also provided in writing at the conclusion of the visit. If applicable, a reminder letter will be sent to the patient regarding the next appointment. BI-RADS CATEGORY  4: Suspicious. Electronically Signed   By: Kristopher Oppenheim M.D.   On: 08/31/2017 11:49   [place] Lt Breast Bx W Loc  Dev 1st Lesion Img Bx Spec US Guide  Addendum Date: 09/06/2017   ADDENDUM REPORT: 09/06/2017 12:19 ADDENDUM: Pathology revealed GRADE II INVASIVE DUCTAL CARCINOMA, PERINEURAL INVASION IS IDENTIFIED of LEFT breast, retroareolar 9 o'clock axis. This was found to be concordant by Dr. Franki Cabot. Pathology results were discussed with the patient's daughter and Power of Doyce Para, by telephone. Kennith Gain reported the patient did well after the biopsy with tenderness at the site. Post biopsy instructions and care were reviewed and questions were answered. Kennith Gain was encouraged to call [facility] for any additional concerns. The patient was referred to [facility] on September 12, 2017. Pathology results reported by Roselind Messier, RN on 09/06/2017. Electronically Signed  By: Franki Cabot M.D.   On: 09/06/2017 12:19   Result Date: 09/06/2017 CLINICAL DATA:  Patient with a retroareolar LEFT breast mass presents today for ultrasound-guided core biopsy. EXAM: ULTRASOUND GUIDED LEFT BREAST CORE NEEDLE BIOPSY COMPARISON:  Previous exam(s). PROCEDURE: I met with the patient and we discussed the procedure of ultrasound-guided biopsy, including benefits and alternatives. We discussed the high likelihood of a successful procedure. We discussed the risks of the procedure including infection, bleeding, tissue injury, clip migration, and inadequate sampling. Informed written consent was given. The usual time-out protocol was performed immediately prior to the procedure. Lesion quadrant: Retroareolar 9 o'clock axis. Using sterile technique and 1% Lidocaine as local anesthetic, under direct ultrasound visualization, a 14 gauge spring-loaded device was used to perform biopsy of the retroareolar LEFT breast mass, 9 o'clock axis,using a inferior approach. At the conclusion of the procedure, a ribbon shaped tissue marker  clip was deployed into the biopsy cavity. Postprocedure ultrasound shows the clip well-positioned within the retroareolar mass. IMPRESSION: Ultrasound-guided biopsy of the retroareolar LEFT breast mass. No apparent complications. Electronically Signed: By: Franki Cabot M.D. On: 09/05/2017 13:15       IMPRESSION/PLAN: 1. 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 Dr. Lisbeth Renshaw discusses the pathology findings and reviews the nature of invasive left breast disease. The consensus from the breast conference includes mastectomy followed by antiestrogen therapy. We would not anticipate a role for radiotherapy knowing what we understand about her cancer today. However Dr. Lisbeth Renshaw discussed the risks, benefits, short, and long term effects of radiotherapy for scenarios when postmastectomy radiotherapy is needed. We will see her back as needed following review of her final pathology.  The above documentation reflects my direct findings during this shared patient visit. Please see the separate note by Dr. Lisbeth Renshaw on this date for the remainder of the patient's plan of care.    Carola Rhine, PAC

## 2017-09-12 NOTE — Progress Notes (Signed)
Nutrition Assessment  Reason for Assessment:  Pt seen in Breast Clinic  ASSESSMENT:   77 year old female with new diagnosis of breast cancer.  Past medical history reviewed.    Patient a resident of [facility]. Daughter present and reports patient appetite is good and eats well.  Center sends patient chopped meats  Medications:  reviewed  Labs: reviewed  Anthropometrics:   Height: 70 inches Weight: unable to measure, in wheelchair. Daughter thinks weight has been stable BMI:    NUTRITION DIAGNOSIS: Food and nutrition related knowledge deficit related to new diagnosis of breast cancer as evidenced by no prior need for nutrition related information.  INTERVENTION:   Discussed and provided packet of information regarding nutritional tips for breast cancer patients.   Questions answered.  Teachback method used.  Contact information provided and patient knows to contact me with questions/concerns.    MONITORING, EVALUATION, and GOAL: Pt will consume a healthy plant based diet to maintain lean body mass throughout treatment.   Harry Shuck B. Zenia Resides, [place], [place] Registered Dietitian [phone] (pager)

## 2017-09-13 ENCOUNTER — Encounter: Payer: Self-pay | Admitting: General Practice

## 2017-09-13 NOTE — Progress Notes (Signed)
[place] Psychosocial Distress Screening Spiritual Care  Met with Carolyn Parrish and daughter Carolyn Parrish, her primary caregiver outside of pt's residential care, in [facility] to introduce [place] team/resources, reviewing distress screen per protocol.  The patient scored a 8 on the Psychosocial Distress Thermometer which indicates severe distress. Also assessed for distress and other psychosocial needs.   ONCBCN DISTRESS SCREENING 09/13/2017  Screening Type Initial Screening  Distress experienced in past week (1-10) 8  Emotional problem type Depression;Nervousness/Anxiety;Adjusting to illness  Information Concerns Type Lack of info about treatment  Referral to support programs Yes    Carolyn Parrish responded to direct questions, but engaged in little conversation.   Per dtr, Carolyn Parrish has three children, but Carolyn Parrish is the only local one. Carolyn Parrish visits her mom regularly for support, has a 43yo son, works (full time?), and is taking a real estate class. Normalized caregiver stress and the challenging need for self-care, noting that many [place] support programs are open to caregivers, too.  Both pt and dtr appear less distressed after meeting [facility] team and resolving some of the unknown with a plan.  Follow up needed: No. Carolyn Parrish plans to contact Support Team for f/u support as needed/desired, but please also page if immediate needs arise or circumstances change. Thank you.   [place], [place], [facility] Pager [phone] Voicemail [phone]

## 2017-09-18 ENCOUNTER — Telehealth: Payer: Self-pay | Admitting: *Deleted

## 2017-09-18 NOTE — Telephone Encounter (Signed)
  Oncology Nurse Navigator Documentation  Navigator Location: [facility] (09/18/17 1500)   )Navigator Encounter Type: Telephone;[facility]C Follow-up (09/18/17 1500) Telephone: Outgoing Call;Clinic/[facility]C Follow-up (09/18/17 1500)                                                  Time Spent with Patient: 15 (09/18/17 1500)

## 2017-09-25 ENCOUNTER — Non-Acute Institutional Stay: Payer: Medicare Other | Admitting: Hospice and Palliative Medicine

## 2017-09-26 ENCOUNTER — Non-Acute Institutional Stay: Payer: Medicare Other | Admitting: Hospice and Palliative Medicine

## 2017-09-26 DIAGNOSIS — Z515 Encounter for palliative care: Secondary | ICD-10-CM

## 2017-09-27 NOTE — Progress Notes (Signed)
Visit attempted. Case discussed with facility NP. Patient recently diagnosed with breast cancer and is pending surgery. NP would like PC to follow in anticipation of future needs. I tried speaking with patient today and she asked me to leave. Will try again another day.

## 2017-10-02 ENCOUNTER — Non-Acute Institutional Stay: Payer: Medicare Other | Admitting: Hospice and Palliative Medicine

## 2017-10-03 NOTE — Progress Notes (Signed)
I again tried speaking with patient but she asked that I leave. Facility NP says that patient is involved in decision making but it is unclear to me from my limited interaction with patient how willing she is to participate in establishing medical goals. Will try again.

## 2017-10-23 NOTE — Pre-Procedure Instructions (Addendum)
Carolyn Parrish  10/23/2017      [facility], [address]. [place] [address] Phone: [phone] Fax: [phone]    Your procedure is scheduled on October 30, 2017.  Report to [facility] Admitting at 700 AM.  Call this number if you have problems the morning of surgery:  [phone]   Remember:  Do not eat or drink after midnight.  You may drink clear liquids until 0600 AM the morning of surgery-3 hours prior to procedure start time.  Clear liquids allowed are: Water, Juice (non-citric and without pulp), Carbonated beverages, Clear Tea, Black Coffee only, Plain Jell-O only, Gatorade and Plain Popsicles only- NO MILK PRODUCTS    Take these medicines the morning of surgery with A SIP OF WATER  Tylenol-if needed depakote (divalproex)  Follow your surgeon's instructions on when to hold/resume aspirin.  If no instructions were given call the office to determine how they would like to you take aspirin   7 days prior to surgery STOP taking any Aleve, Naproxen, Ibuprofen, Motrin, Advil, Goody's, BC's, all herbal medications, fish oil, and all vitamins  WHAT DO I DO ABOUT MY DIABETES MEDICATION?  Marland Kitchen Do not take oral diabetes medicines (pills) the morning of surgery-metformin (glucophage)  Reviewed and Endorsed by [facility] Patient Education Committee, August 2015   How to Manage Your Diabetes Before and After Surgery  Why is it important to control my blood sugar before and after surgery? . Improving blood sugar levels before and after surgery helps healing and can limit problems. . A way of improving blood sugar control is eating a healthy diet by: o  Eating less sugar and carbohydrates o  Increasing activity/exercise o  Talking with your doctor about reaching your blood sugar goals . High blood sugars (greater than 180 mg/dL) can raise your risk of infections and slow your recovery, so you will  need to focus on controlling your diabetes during the weeks before surgery. . Make sure that the doctor who takes care of your diabetes knows about your planned surgery including the date and location.  How do I manage my blood sugar before surgery? . Check your blood sugar at least 4 times a day, starting 2 days before surgery, to make sure that the level is not too high or low. o Check your blood sugar the morning of your surgery when you wake up and every 2 hours until you get to the Short Stay unit. . If your blood sugar is less than 70 mg/dL, you will need to treat for low blood sugar: o Do not take insulin. o Treat a low blood sugar (less than 70 mg/dL) with  cup of clear juice (cranberry or apple), 4 glucose tablets, OR glucose gel. Recheck blood sugar in 15 minutes after treatment (to make sure it is greater than 70 mg/dL). If your blood sugar is not greater than 70 mg/dL on recheck, call [phone] o  for further instructions. . Report your blood sugar to the short stay nurse when you get to Short Stay.  . If you are admitted to the hospital after surgery: o Your blood sugar will be checked by the staff and you will probably be given insulin after surgery (instead of oral diabetes medicines) to make sure you have good blood sugar levels. o The goal for blood sugar control after surgery is 80-180 mg/dL.  [place]- Preparing For Surgery  Before  surgery, you can play an important role. Because skin is not sterile, your skin needs to be as free of germs as possible. You can reduce the number of germs on your skin by washing with CHG (chlorahexidine gluconate) Soap before surgery.  CHG is an antiseptic cleaner which kills germs and bonds with the skin to continue killing germs even after washing.    Oral Hygiene is also important to reduce your risk of infection.  Remember - BRUSH YOUR TEETH THE MORNING OF SURGERY WITH YOUR REGULAR TOOTHPASTE  Please do not use if you have an allergy  to CHG or antibacterial soaps. If your skin becomes reddened/irritated stop using the CHG.  Do not shave (including legs and underarms) for at least 48 hours prior to first CHG shower. It is OK to shave your face.  Please follow these instructions carefully.   1. Shower the NIGHT BEFORE SURGERY and the MORNING OF SURGERY with CHG.   2. If you chose to wash your hair, wash your hair first as usual with your normal shampoo.  3. After you shampoo, rinse your hair and body thoroughly to remove the shampoo.  4. Use CHG as you would any other liquid soap. You can apply CHG directly to the skin and wash gently with a scrungie or a clean washcloth.   5. Apply the CHG Soap to your body ONLY FROM THE NECK DOWN.  Do not use on open wounds or open sores. Avoid contact with your eyes, ears, mouth and genitals (private parts). Wash Face and genitals (private parts)  with your normal soap.  6. Wash thoroughly, paying special attention to the area where your surgery will be performed.  7. Thoroughly rinse your body with warm water from the neck down.  8. DO NOT shower/wash with your normal soap after using and rinsing off the CHG Soap.  9. Pat yourself dry with a CLEAN TOWEL.  10. Wear CLEAN PAJAMAS to bed the night before surgery, wear comfortable clothes the morning of surgery  11. Place CLEAN SHEETS on your bed the night of your first shower and DO NOT SLEEP WITH PETS.  Day of Surgery:  Do not apply any deodorants/lotions.  Please wear clean clothes to the hospital/surgery center.   Remember to brush your teeth WITH YOUR REGULAR TOOTHPASTE.              Do not wear jewelry, make-up or nail polish.  Do not wear lotions, powders, or perfumes, or deodorant.  Do not shave 48 hours prior to surgery.    Do not bring valuables to the hospital.  [facility] is not responsible for any belongings or valuables.  Contacts, dentures or bridgework may not be worn into surgery.  Leave your suitcase in the  car.  After surgery it may be brought to your room.  For patients admitted to the hospital, discharge time will be determined by your treatment team.  Patients discharged the day of surgery will not be allowed to drive home.   Please read over the following fact sheets that you were given.

## 2017-10-23 NOTE — Progress Notes (Addendum)
PCP: Garwin Brothers, MD  Cardiologist: Dr. Lilly Cove  EKG: requested from Dr. Thurman Coyer office 10/2017  Stress test: daughter is unsure -requested from Dr. Thurman Coyer office  [place] is unsure -requested from Dr. Thurman Coyer office  Cardiac Cath: denies  Chest x-ray: pt denies past year, no recent respiratory infections  Patient is on aspirin 81 mg and has history of a stroke, the patient's daughter is calling Dr. Josetta Huddle office this afternoon to determine if the patient needs to hold medication prior to surgery and will inform the nursing facility.  Chart given to anesthesia to review pending requested cardiac studies and aspirin orders.  Daughter is power of attorney-Advance directive in system

## 2017-10-24 ENCOUNTER — Encounter (HOSPITAL_COMMUNITY)
Admission: RE | Admit: 2017-10-24 | Discharge: 2017-10-24 | Disposition: A | Discharge status: Home / Self Care | Payer: Medicare Other | Source: Ambulatory Visit | Attending: Surgery | Admitting: Surgery

## 2017-10-24 ENCOUNTER — Encounter (HOSPITAL_COMMUNITY): Payer: Self-pay

## 2017-10-24 DIAGNOSIS — Z79899 Other long term (current) drug therapy: Secondary | ICD-10-CM | POA: Insufficient documentation

## 2017-10-24 DIAGNOSIS — I10 Essential (primary) hypertension: Secondary | ICD-10-CM | POA: Insufficient documentation

## 2017-10-24 DIAGNOSIS — Z8673 Personal history of transient ischemic attack (TIA), and cerebral infarction without residual deficits: Secondary | ICD-10-CM | POA: Diagnosis not present

## 2017-10-24 DIAGNOSIS — I509 Heart failure, unspecified: Secondary | ICD-10-CM | POA: Diagnosis not present

## 2017-10-24 DIAGNOSIS — E119 Type 2 diabetes mellitus without complications: Secondary | ICD-10-CM | POA: Diagnosis not present

## 2017-10-24 DIAGNOSIS — Z01812 Encounter for preprocedural laboratory examination: Secondary | ICD-10-CM | POA: Diagnosis present

## 2017-10-24 DIAGNOSIS — Z7984 Long term (current) use of oral hypoglycemic drugs: Secondary | ICD-10-CM | POA: Diagnosis not present

## 2017-10-24 HISTORY — DX: Type 2 diabetes mellitus without complications: E11.9

## 2017-10-24 HISTORY — DX: Heart failure, unspecified: I50.9

## 2017-10-24 LAB — COMPREHENSIVE METABOLIC PANEL
ALK PHOS: 78 U/L (ref 38–126)
ALT: 28 U/L (ref 0–44)
AST: 25 U/L (ref 15–41)
Albumin: 3.5 g/dL (ref 3.5–5.0)
Anion gap: 10 (ref 5–15)
BILIRUBIN TOTAL: 0.5 mg/dL (ref 0.3–1.2)
BUN: 10 mg/dL (ref 8–23)
CHLORIDE: 104 mmol/L (ref 98–111)
CO2: 27 mmol/L (ref 22–32)
CREATININE: 0.94 mg/dL (ref 0.44–1.00)
Calcium: 9.7 mg/dL (ref 8.9–10.3)
GFR calc Af Amer: >60 mL/min (ref >60)
GFR, EST NON AFRICAN AMERICAN: 58 mL/min — AB (ref >60)
Glucose, Bld: 88 mg/dL (ref 70–99)
Potassium: 5 mmol/L (ref 3.5–5.1)
Sodium: 141 mmol/L (ref 135–145)
Total Protein: 7.5 g/dL (ref 6.5–8.1)

## 2017-10-24 LAB — CBC WITH DIFFERENTIAL/PLATELET
ABS IMMATURE GRANULOCYTES: 0.1 10*3/uL (ref 0.0–0.1)
Basophils Absolute: 0.1 10*3/uL (ref 0.0–0.1)
Basophils Relative: 1 %
Eosinophils Absolute: 0.2 10*3/uL (ref 0.0–0.7)
Eosinophils Relative: 2 %
HEMATOCRIT: 47 % — AB (ref 36.0–46.0)
HEMOGLOBIN: 14.4 g/dL (ref 12.0–15.0)
IMMATURE GRANULOCYTES: 1 %
LYMPHS ABS: 3.5 10*3/uL (ref 0.7–4.0)
LYMPHS PCT: 34 %
MCH: 30.3 pg (ref 26.0–34.0)
MCHC: 30.6 g/dL (ref 30.0–36.0)
MCV: 98.9 fL (ref 78.0–100.0)
Monocytes Absolute: 1.1 10*3/uL — ABNORMAL HIGH (ref 0.1–1.0)
Monocytes Relative: 11 %
NEUTROS ABS: 5.6 10*3/uL (ref 1.7–7.7)
NEUTROS PCT: 53 %
PLATELETS: 283 10*3/uL (ref 150–400)
RBC: 4.75 MIL/uL (ref 3.87–5.11)
RDW: 15.4 % (ref 11.5–15.5)
WBC: 10.5 10*3/uL (ref 4.0–10.5)

## 2017-10-24 LAB — GLUCOSE, CAPILLARY: Glucose-Capillary: 78 mg/dL (ref 70–99)

## 2017-10-25 LAB — HEMOGLOBIN A1C
Hgb A1c MFr Bld: 6.3 % — ABNORMAL HIGH (ref 4.8–5.6)
MEAN PLASMA GLUCOSE: 134 mg/dL

## 2017-10-25 NOTE — Progress Notes (Signed)
Anesthesia Chart Review:  Case:  [id number] Date/Time:  10/30/17 0845   Procedure:  LEFT SIMPLE MASTECTOMY (Left Breast)   Anesthesia type:  General   Pre-op diagnosis:  LEFT BREAST CANCER   Location:  [facility] OR ROOM 02 / [facility] OR   Surgeon:  Erroll Luna, MD      DISCUSSION: Patient is a 77 year old female scheduled for the above procedure.   History includes never smoker, left breast cancer, Schizoaffective disorder, CHF, HTN, HLD, CVA '96, DM2.   She was seen by cardiologist Dr. Wynonia Lawman on 09/25/17 for a preoperative evaluation. He notes that patient was not able to provided details into her CHF diagnosis--and did not recall ever seeing a cardiologist. Following EKG and echo, he wrote on 09/28/17, "She has an echocardiogram today that shows an ejection fraction [sic] this is technically difficult. Apical views are not obtainable however it appears that her ejection fraction is between 50 and 55% with some mild LVH. The valves appeared grossly normal. My opinion is that she may proceed with the breast surgery from a cardiac people [sic] without further cardiovascular workup. She does not have decompensated systolic congestive heart failure and just need to pay attention to volume status during surgery."   Patient [facility]. According to SNF records, Ashlye Oviedo (daughter) is POA [phone]) and will be at hospital on the day of surgery.   Based on currently available information, I would anticipate that she can proceed as planned if no acute changes.   VS: BP 134/71   Pulse 81   Temp 37.2 C (Oral)   Resp 16   Ht 5\' 10"  (1.778 m)   Wt 93 kg   SpO2 100%   BMI 29.41 kg/m    PROVIDERS: Garwin Brothers, MD is PCP Ezzard Standing, MD is cardiologist Magrinat, Sarajane Jews, MD is HEM-ONC   LABS: Labs reviewed: Acceptable for surgery. (all labs ordered are listed, but only abnormal results are displayed)  Labs Reviewed  CBC WITH DIFFERENTIAL/PLATELET -  Abnormal; Notable for the following components:      Result Value   HCT 47.0 (*)    Monocytes Absolute 1.1 (*)    All other components within normal limits  COMPREHENSIVE METABOLIC PANEL - Abnormal; Notable for the following components:   GFR calc non Af Amer 58 (*)    All other components within normal limits  HEMOGLOBIN A1C - Abnormal; Notable for the following components:   Hgb A1c MFr Bld 6.3 (*)    All other components within normal limits  GLUCOSE, CAPILLARY    EKG: 09/25/17 (Dr. Wynonia Lawman): SR. RSR pattern in V1. Poor r wave progression (V4-6). Isolated Q wave in III.    CV: Echo 09/28/17 (Dr. Wynonia Lawman): Conclusions: 1. Technically difficult with no apical views. 2.  Left ventricle cavity is normal in size.  Mild to moderate concentric hypertrophy of the left ventricle.  Normal global wall motion segment seen, but study inadequate for evaluation of all segments.  Estimated EF 55%. 3.  Trace tricuspid regurgitation. 4.  Trace pulmonic regurgitation.   Past Medical History:  Diagnosis Date  . CHF (congestive heart failure) ([place])   . Diabetes mellitus without complication ([place])   . Heart failure ([place])   . Hyperlipidemia   . Hypertension   . Schizoaffective disorder ([place])   . Stroke [facility])     Past Surgical History:  Procedure Laterality Date  . CHOLECYSTECTOMY     2016  MEDICATIONS: . acetaminophen (TYLENOL) 325 MG tablet  . alum & mag hydroxide-simeth (MYLANTA) 200-200-20 MG/5ML suspension  . ascorbic acid (VITAMIN C) 500 MG tablet  . aspirin 81 MG chewable tablet  . camphor-menthol (SARNA) lotion  . divalproex (DEPAKOTE ER) 250 MG 24 hr tablet  . docusate sodium (COLACE) 100 MG capsule  . lisinopril (PRINIVIL,ZESTRIL) 2.5 MG tablet  . magnesium hydroxide (MILK OF MAGNESIA) 400 MG/5ML suspension  . metFORMIN (GLUCOPHAGE) 500 MG tablet  . polyethylene glycol (MIRALAX / GLYCOLAX) packet  . Probiotic Product (PROBIOTIC DAILY PO)  . vitamin B-12 (CYANOCOBALAMIN) 500  MCG tablet  . Vitamin D, Ergocalciferol, (DRISDOL) 50000 units CAPS capsule   No current facility-administered medications for this encounter.    Patient's daughter to contact Dr. Josetta Huddle office regarding perioperative ASA instructions. I also notified [facility] triage nurse to follow-up with daughter or SNF if patient will need to hold.   George Hugh [facility] Short Stay Center/Anesthesiology Phone [phone] 10/25/2017 10:57 AM

## 2017-10-29 MED ORDER — CEFAZOLIN SODIUM-DEXTROSE 2-4 GM/100ML-% IV SOLN
2.0000 g | INTRAVENOUS | Status: AC
  Administered 2017-10-30: 2 g via INTRAVENOUS
  Filled 2017-10-29: qty 100

## 2017-10-29 NOTE — Anesthesia Preprocedure Evaluation (Addendum)
Anesthesia Evaluation  Patient identified by MRN, date of birth, ID band Patient awake    Reviewed: NPO status , Patient's Chart, lab work & pertinent test results  Airway Mallampati: III  TM Distance: >3 FB Neck ROM: Full    Dental  (+) Dental Advisory Given   Pulmonary neg pulmonary ROS,    breath sounds clear to auscultation       Cardiovascular hypertension, Pt. on medications +CHF   Rhythm:Regular Rate:Normal  EF 50-55% Valves okay   Neuro/Psych CVA, Residual Symptoms    GI/Hepatic negative GI ROS, Neg liver ROS,   Endo/Other  diabetes, Type 2, Oral Hypoglycemic Agents  Renal/GU negative Renal ROS     Musculoskeletal   Abdominal   Peds  Hematology negative hematology ROS (+)   Anesthesia Other Findings   Reproductive/Obstetrics                            Lab Results  Component Value Date   WBC 10.5 10/24/2017   HGB 14.4 10/24/2017   HCT 47.0 (H) 10/24/2017   MCV 98.9 10/24/2017   PLT 283 10/24/2017   Lab Results  Component Value Date   CREATININE 0.94 10/24/2017   BUN 10 10/24/2017   NA 141 10/24/2017   K 5.0 10/24/2017   CL 104 10/24/2017   CO2 27 10/24/2017    Anesthesia Physical Anesthesia Plan  ASA: III  Anesthesia Plan: General   Post-op Pain Management: GA combined w/ Regional for post-op pain   Induction: Intravenous  PONV Risk Score and Plan: 3 and Dexamethasone, Ondansetron and Treatment may vary due to age or medical condition  Airway Management Planned: LMA  Additional Equipment:   Intra-op Plan:   Post-operative Plan: Extubation in OR  Informed Consent: I have reviewed the patients History and Physical, chart, labs and discussed the procedure including the risks, benefits and alternatives for the proposed anesthesia with the patient or authorized representative who has indicated his/her understanding and acceptance.   Dental advisory  given  Plan Discussed with: CRNA  Anesthesia Plan Comments:        Anesthesia Quick Evaluation

## 2017-10-30 ENCOUNTER — Encounter (HOSPITAL_COMMUNITY)
Admission: RE | Disposition: A | Discharge status: Home / Self Care | Payer: Self-pay | Source: Ambulatory Visit | Attending: Surgery

## 2017-10-30 ENCOUNTER — Ambulatory Visit (HOSPITAL_COMMUNITY): Payer: Medicare Other | Admitting: Anesthesiology

## 2017-10-30 ENCOUNTER — Other Ambulatory Visit: Payer: Self-pay

## 2017-10-30 ENCOUNTER — Ambulatory Visit (HOSPITAL_COMMUNITY): Payer: Medicare Other | Admitting: Vascular Surgery

## 2017-10-30 ENCOUNTER — Encounter (HOSPITAL_COMMUNITY): Payer: Self-pay | Admitting: Orthopedic Surgery

## 2017-10-30 ENCOUNTER — Observation Stay (HOSPITAL_COMMUNITY)
Admission: RE | Admit: 2017-10-30 | Discharge: 2017-10-31 | Disposition: A | Discharge status: Home / Self Care | Payer: Medicare Other | Source: Ambulatory Visit | Attending: Surgery | Admitting: Surgery

## 2017-10-30 DIAGNOSIS — I509 Heart failure, unspecified: Secondary | ICD-10-CM | POA: Insufficient documentation

## 2017-10-30 DIAGNOSIS — Z7984 Long term (current) use of oral hypoglycemic drugs: Secondary | ICD-10-CM | POA: Diagnosis not present

## 2017-10-30 DIAGNOSIS — Z888 Allergy status to other drugs, medicaments and biological substances status: Secondary | ICD-10-CM | POA: Insufficient documentation

## 2017-10-30 DIAGNOSIS — I110 Hypertensive heart disease with heart failure: Secondary | ICD-10-CM | POA: Insufficient documentation

## 2017-10-30 DIAGNOSIS — C50911 Malignant neoplasm of unspecified site of right female breast: Secondary | ICD-10-CM | POA: Diagnosis present

## 2017-10-30 DIAGNOSIS — C773 Secondary and unspecified malignant neoplasm of axilla and upper limb lymph nodes: Secondary | ICD-10-CM | POA: Diagnosis not present

## 2017-10-30 DIAGNOSIS — E119 Type 2 diabetes mellitus without complications: Secondary | ICD-10-CM | POA: Diagnosis not present

## 2017-10-30 DIAGNOSIS — Z170 Estrogen receptor positive status [ER+]: Secondary | ICD-10-CM | POA: Insufficient documentation

## 2017-10-30 DIAGNOSIS — Z79899 Other long term (current) drug therapy: Secondary | ICD-10-CM | POA: Insufficient documentation

## 2017-10-30 DIAGNOSIS — Z7982 Long term (current) use of aspirin: Secondary | ICD-10-CM | POA: Insufficient documentation

## 2017-10-30 DIAGNOSIS — I6930 Unspecified sequelae of cerebral infarction: Secondary | ICD-10-CM | POA: Insufficient documentation

## 2017-10-30 DIAGNOSIS — C50912 Malignant neoplasm of unspecified site of left female breast: Secondary | ICD-10-CM | POA: Diagnosis present

## 2017-10-30 DIAGNOSIS — Z993 Dependence on wheelchair: Secondary | ICD-10-CM | POA: Insufficient documentation

## 2017-10-30 HISTORY — DX: Malignant (primary) neoplasm, unspecified: C80.1

## 2017-10-30 HISTORY — PX: SIMPLE MASTECTOMY WITH AXILLARY SENTINEL NODE BIOPSY: SHX6098

## 2017-10-30 HISTORY — PX: MASTECTOMY COMPLETE / SIMPLE: SUR845

## 2017-10-30 LAB — GLUCOSE, CAPILLARY
GLUCOSE-CAPILLARY: 115 mg/dL — AB (ref 70–99)
Glucose-Capillary: 105 mg/dL — ABNORMAL HIGH (ref 70–99)

## 2017-10-30 SURGERY — SIMPLE MASTECTOMY
Anesthesia: General | Site: Breast | Laterality: Left

## 2017-10-30 MED ORDER — LIDOCAINE 2% (20 MG/ML) 5 ML SYRINGE
INTRAMUSCULAR | Status: AC
  Filled 2017-10-30: qty 5

## 2017-10-30 MED ORDER — FENTANYL CITRATE (PF) 250 MCG/5ML IJ SOLN
INTRAMUSCULAR | Status: AC
  Filled 2017-10-30: qty 5

## 2017-10-30 MED ORDER — GABAPENTIN 300 MG PO CAPS
300.0000 mg | ORAL_CAPSULE | Freq: Two times a day (BID) | ORAL | Status: DC
  Administered 2017-10-30 – 2017-10-31 (×3): 300 mg via ORAL
  Filled 2017-10-30 (×3): qty 1

## 2017-10-30 MED ORDER — CYANOCOBALAMIN 500 MCG PO TABS
500.0000 ug | ORAL_TABLET | Freq: Every day | ORAL | Status: DC
  Administered 2017-10-30 – 2017-10-31 (×2): 500 ug via ORAL
  Filled 2017-10-30 (×2): qty 1

## 2017-10-30 MED ORDER — DEXAMETHASONE SODIUM PHOSPHATE 4 MG/ML IJ SOLN
INTRAMUSCULAR | Status: DC | PRN
  Administered 2017-10-30: 10 mg via INTRAVENOUS

## 2017-10-30 MED ORDER — LACTATED RINGERS IV SOLN
INTRAVENOUS | Status: DC
  Administered 2017-10-30 (×2): via INTRAVENOUS

## 2017-10-30 MED ORDER — HYDRALAZINE HCL 20 MG/ML IJ SOLN: 10.0000 mg | INTRAMUSCULAR | Status: DC | PRN

## 2017-10-30 MED ORDER — DEXTROSE-NACL 5-0.9 % IV SOLN
INTRAVENOUS | Status: DC
  Administered 2017-10-30 – 2017-10-31 (×2): via INTRAVENOUS

## 2017-10-30 MED ORDER — LACTATED RINGERS IV SOLN
INTRAVENOUS | Status: DC | PRN
  Administered 2017-10-30: 09:00:00 via INTRAVENOUS

## 2017-10-30 MED ORDER — HEMOSTATIC AGENTS (NO CHARGE) OPTIME
TOPICAL | Status: DC | PRN
  Administered 2017-10-30: 5

## 2017-10-30 MED ORDER — EPHEDRINE SULFATE 50 MG/ML IJ SOLN
INTRAMUSCULAR | Status: DC | PRN
  Administered 2017-10-30: 10 mg via INTRAVENOUS

## 2017-10-30 MED ORDER — POLYETHYLENE GLYCOL 3350 17 G PO PACK
17.0000 g | PACK | Freq: Every day | ORAL | Status: DC
  Administered 2017-10-30 – 2017-10-31 (×2): 17 g via ORAL
  Filled 2017-10-30 (×2): qty 1

## 2017-10-30 MED ORDER — SUCCINYLCHOLINE CHLORIDE 200 MG/10ML IV SOSY
PREFILLED_SYRINGE | INTRAVENOUS | Status: AC
  Filled 2017-10-30: qty 10

## 2017-10-30 MED ORDER — ONDANSETRON HCL 4 MG/2ML IJ SOLN
INTRAMUSCULAR | Status: DC | PRN
  Administered 2017-10-30: 4 mg via INTRAVENOUS

## 2017-10-30 MED ORDER — CEFAZOLIN SODIUM-DEXTROSE 2-4 GM/100ML-% IV SOLN
2.0000 g | Freq: Three times a day (TID) | INTRAVENOUS | Status: AC
  Administered 2017-10-30: 2 g via INTRAVENOUS
  Filled 2017-10-30: qty 100

## 2017-10-30 MED ORDER — GABAPENTIN 300 MG PO CAPS
300.0000 mg | ORAL_CAPSULE | ORAL | Status: AC
  Administered 2017-10-30: 300 mg via ORAL
  Filled 2017-10-30: qty 1

## 2017-10-30 MED ORDER — HYDROMORPHONE HCL 1 MG/ML IJ SOLN: 1.0000 mg | INTRAMUSCULAR | Status: DC | PRN

## 2017-10-30 MED ORDER — ONDANSETRON 4 MG PO TBDP: 4.0000 mg | ORAL_TABLET | Freq: Four times a day (QID) | ORAL | Status: DC | PRN

## 2017-10-30 MED ORDER — ACETAMINOPHEN 500 MG PO TABS
1000.0000 mg | ORAL_TABLET | ORAL | Status: AC
  Administered 2017-10-30: 1000 mg via ORAL
  Filled 2017-10-30: qty 2

## 2017-10-30 MED ORDER — CHLORHEXIDINE GLUCONATE CLOTH 2 % EX PADS: 6.0000 | MEDICATED_PAD | Freq: Once | CUTANEOUS | Status: DC

## 2017-10-30 MED ORDER — PROBIOTIC DAILY PO CAPS: ORAL_CAPSULE | Freq: Every day | ORAL | Status: DC

## 2017-10-30 MED ORDER — DIVALPROEX SODIUM ER 250 MG PO TB24
250.0000 mg | ORAL_TABLET | Freq: Every day | ORAL | Status: DC
  Administered 2017-10-30 – 2017-10-31 (×2): 250 mg via ORAL
  Filled 2017-10-30 (×2): qty 1

## 2017-10-30 MED ORDER — FENTANYL CITRATE (PF) 100 MCG/2ML IJ SOLN: 25.0000 ug | INTRAMUSCULAR | Status: DC | PRN

## 2017-10-30 MED ORDER — FENTANYL CITRATE (PF) 100 MCG/2ML IJ SOLN
50.0000 ug | Freq: Once | INTRAMUSCULAR | Status: AC
  Administered 2017-10-30: 50 ug via INTRAVENOUS

## 2017-10-30 MED ORDER — SODIUM CHLORIDE 0.9 % IV SOLN
INTRAVENOUS | Status: DC | PRN
  Administered 2017-10-30: 25 ug/min via INTRAVENOUS

## 2017-10-30 MED ORDER — ONDANSETRON HCL 4 MG/2ML IJ SOLN: 4.0000 mg | Freq: Four times a day (QID) | INTRAMUSCULAR | Status: DC | PRN

## 2017-10-30 MED ORDER — CELECOXIB 200 MG PO CAPS
200.0000 mg | ORAL_CAPSULE | ORAL | Status: AC
  Administered 2017-10-30: 200 mg via ORAL
  Filled 2017-10-30: qty 1

## 2017-10-30 MED ORDER — MIDAZOLAM HCL 2 MG/2ML IJ SOLN
INTRAMUSCULAR | Status: AC
  Filled 2017-10-30: qty 2

## 2017-10-30 MED ORDER — METHOCARBAMOL 500 MG PO TABS
500.0000 mg | ORAL_TABLET | Freq: Four times a day (QID) | ORAL | Status: DC | PRN
  Filled 2017-10-30: qty 1

## 2017-10-30 MED ORDER — ROCURONIUM BROMIDE 50 MG/5ML IV SOSY
PREFILLED_SYRINGE | INTRAVENOUS | Status: AC
  Filled 2017-10-30: qty 5

## 2017-10-30 MED ORDER — LIDOCAINE HCL (CARDIAC) PF 100 MG/5ML IV SOSY
PREFILLED_SYRINGE | INTRAVENOUS | Status: DC | PRN
  Administered 2017-10-30: 30 mg via INTRAVENOUS

## 2017-10-30 MED ORDER — DOCUSATE SODIUM 100 MG PO CAPS
100.0000 mg | ORAL_CAPSULE | Freq: Two times a day (BID) | ORAL | Status: DC
  Administered 2017-10-30 – 2017-10-31 (×3): 100 mg via ORAL
  Filled 2017-10-30 (×3): qty 1

## 2017-10-30 MED ORDER — VITAMIN D (ERGOCALCIFEROL) 1.25 MG (50000 UNIT) PO CAPS: 50000.0000 [IU] | ORAL_CAPSULE | ORAL | Status: DC

## 2017-10-30 MED ORDER — ENOXAPARIN SODIUM 40 MG/0.4ML ~~LOC~~ SOLN
40.0000 mg | SUBCUTANEOUS | Status: DC
  Administered 2017-10-31: 40 mg via SUBCUTANEOUS
  Filled 2017-10-30: qty 0.4

## 2017-10-30 MED ORDER — DEXAMETHASONE SODIUM PHOSPHATE 10 MG/ML IJ SOLN
INTRAMUSCULAR | Status: AC
  Filled 2017-10-30: qty 1

## 2017-10-30 MED ORDER — CLONIDINE HCL (ANALGESIA) 100 MCG/ML EP SOLN
EPIDURAL | Status: DC | PRN
  Administered 2017-10-30: 50 ug

## 2017-10-30 MED ORDER — PHENYLEPHRINE 40 MCG/ML (10ML) SYRINGE FOR IV PUSH (FOR BLOOD PRESSURE SUPPORT)
PREFILLED_SYRINGE | INTRAVENOUS | Status: AC
  Filled 2017-10-30: qty 10

## 2017-10-30 MED ORDER — PROPOFOL 10 MG/ML IV BOLUS
INTRAVENOUS | Status: DC | PRN
  Administered 2017-10-30: 150 mg via INTRAVENOUS

## 2017-10-30 MED ORDER — METFORMIN HCL 500 MG PO TABS
500.0000 mg | ORAL_TABLET | Freq: Every day | ORAL | Status: DC
  Administered 2017-10-30: 500 mg via ORAL
  Filled 2017-10-30: qty 1

## 2017-10-30 MED ORDER — OXYCODONE HCL 5 MG PO TABS: 5.0000 mg | ORAL_TABLET | ORAL | Status: DC | PRN

## 2017-10-30 MED ORDER — MAGNESIUM HYDROXIDE 400 MG/5ML PO SUSP: 30.0000 mL | Freq: Every day | ORAL | Status: DC | PRN

## 2017-10-30 MED ORDER — RISAQUAD PO CAPS
1.0000 | ORAL_CAPSULE | Freq: Every day | ORAL | Status: DC
  Administered 2017-10-30 – 2017-10-31 (×2): 1 via ORAL
  Filled 2017-10-30 (×2): qty 1

## 2017-10-30 MED ORDER — ONDANSETRON HCL 4 MG/2ML IJ SOLN
INTRAMUSCULAR | Status: AC
  Filled 2017-10-30: qty 2

## 2017-10-30 MED ORDER — LISINOPRIL 5 MG PO TABS
2.5000 mg | ORAL_TABLET | Freq: Every day | ORAL | Status: DC
  Administered 2017-10-30 – 2017-10-31 (×2): 2.5 mg via ORAL
  Filled 2017-10-30 (×2): qty 1

## 2017-10-30 MED ORDER — 0.9 % SODIUM CHLORIDE (POUR BTL) OPTIME
TOPICAL | Status: DC | PRN
  Administered 2017-10-30 (×2): 1000 mL

## 2017-10-30 MED ORDER — CELECOXIB 200 MG PO CAPS
200.0000 mg | ORAL_CAPSULE | Freq: Two times a day (BID) | ORAL | Status: DC
  Administered 2017-10-30 – 2017-10-31 (×3): 200 mg via ORAL
  Filled 2017-10-30 (×3): qty 1

## 2017-10-30 MED ORDER — ONDANSETRON HCL 4 MG/2ML IJ SOLN: 4.0000 mg | Freq: Once | INTRAMUSCULAR | Status: DC | PRN

## 2017-10-30 MED ORDER — FENTANYL CITRATE (PF) 100 MCG/2ML IJ SOLN
INTRAMUSCULAR | Status: DC | PRN
  Administered 2017-10-30: 50 ug via INTRAVENOUS

## 2017-10-30 MED ORDER — PROPOFOL 10 MG/ML IV BOLUS
INTRAVENOUS | Status: AC
  Filled 2017-10-30: qty 20

## 2017-10-30 MED ORDER — FENTANYL CITRATE (PF) 100 MCG/2ML IJ SOLN
INTRAMUSCULAR | Status: AC
  Administered 2017-10-30: 50 ug via INTRAVENOUS
  Filled 2017-10-30: qty 2

## 2017-10-30 MED ORDER — BUPIVACAINE-EPINEPHRINE (PF) 0.5% -1:200000 IJ SOLN
INTRAMUSCULAR | Status: DC | PRN
  Administered 2017-10-30: 30 mL

## 2017-10-30 MED ORDER — PHENYLEPHRINE HCL 10 MG/ML IJ SOLN
INTRAMUSCULAR | Status: DC | PRN
  Administered 2017-10-30: 100 ug via INTRAVENOUS

## 2017-10-30 SURGICAL SUPPLY — 49 items
APPLIER CLIP 9.375 MED OPEN (MISCELLANEOUS) ×3
BINDER BREAST 3XL (GAUZE/BANDAGES/DRESSINGS) ×3 IMPLANT
BINDER BREAST LRG (GAUZE/BANDAGES/DRESSINGS) IMPLANT
BINDER BREAST XLRG (GAUZE/BANDAGES/DRESSINGS) IMPLANT
BIOPATCH RED 1 DISK 7.0 (GAUZE/BANDAGES/DRESSINGS) ×2 IMPLANT
BIOPATCH RED 1IN DISK 7.0MM (GAUZE/BANDAGES/DRESSINGS) ×1
CANISTER SUCT 3000ML PPV (MISCELLANEOUS) ×3 IMPLANT
CHLORAPREP W/TINT 26ML (MISCELLANEOUS) ×3 IMPLANT
CLIP APPLIE 9.375 MED OPEN (MISCELLANEOUS) ×1 IMPLANT
COVER SURGICAL LIGHT HANDLE (MISCELLANEOUS) ×3 IMPLANT
DERMABOND ADHESIVE PROPEN (GAUZE/BANDAGES/DRESSINGS) ×2
DERMABOND ADVANCED (GAUZE/BANDAGES/DRESSINGS) ×2
DERMABOND ADVANCED .7 DNX12 (GAUZE/BANDAGES/DRESSINGS) ×1 IMPLANT
DERMABOND ADVANCED .7 DNX6 (GAUZE/BANDAGES/DRESSINGS) ×1 IMPLANT
DRAIN CHANNEL 19F RND (DRAIN) ×6 IMPLANT
DRAPE LAPAROSCOPIC ABDOMINAL (DRAPES) ×3 IMPLANT
DRAPE UTILITY XL STRL (DRAPES) ×3 IMPLANT
DRSG TEGADERM 4X4.75 (GAUZE/BANDAGES/DRESSINGS) ×3 IMPLANT
ELECT BLADE 4.0 EZ CLEAN MEGAD (MISCELLANEOUS) ×3
ELECT CAUTERY BLADE 6.4 (BLADE) IMPLANT
ELECT REM PT RETURN 9FT ADLT (ELECTROSURGICAL) ×3
ELECTRODE BLDE 4.0 EZ CLN MEGD (MISCELLANEOUS) ×1 IMPLANT
ELECTRODE REM PT RTRN 9FT ADLT (ELECTROSURGICAL) ×1 IMPLANT
EVACUATOR SILICONE 100CC (DRAIN) ×6 IMPLANT
GAUZE SPONGE 4X4 12PLY STRL (GAUZE/BANDAGES/DRESSINGS) ×3 IMPLANT
GLOVE BIO SURGEON STRL SZ8 (GLOVE) ×3 IMPLANT
GLOVE BIOGEL PI IND STRL 8 (GLOVE) ×1 IMPLANT
GLOVE BIOGEL PI INDICATOR 8 (GLOVE) ×2
GOWN STRL REUS W/ TWL LRG LVL3 (GOWN DISPOSABLE) ×2 IMPLANT
GOWN STRL REUS W/ TWL XL LVL3 (GOWN DISPOSABLE) ×1 IMPLANT
GOWN STRL REUS W/TWL LRG LVL3 (GOWN DISPOSABLE) ×4
GOWN STRL REUS W/TWL XL LVL3 (GOWN DISPOSABLE) ×2
HEMOSTAT ARISTA ABSORB 3G PWDR (MISCELLANEOUS) ×15 IMPLANT
KIT BASIN OR (CUSTOM PROCEDURE TRAY) ×3 IMPLANT
KIT TURNOVER KIT B (KITS) ×3 IMPLANT
NS IRRIG 1000ML POUR BTL (IV SOLUTION) ×3 IMPLANT
PACK GENERAL/GYN (CUSTOM PROCEDURE TRAY) ×3 IMPLANT
PAD ABD 8X10 STRL (GAUZE/BANDAGES/DRESSINGS) ×3 IMPLANT
PAD ARMBOARD 7.5X6 YLW CONV (MISCELLANEOUS) ×3 IMPLANT
PENCIL SMOKE EVACUATOR (MISCELLANEOUS) ×3 IMPLANT
SLEEVE SUCTION 125 (MISCELLANEOUS) ×3 IMPLANT
SLEEVE SURGEON STRL (DRAPES) ×3 IMPLANT
SPECIMEN JAR X LARGE (MISCELLANEOUS) ×3 IMPLANT
SUT ETHILON 3 0 FSL (SUTURE) ×6 IMPLANT
SUT MNCRL AB 4-0 PS2 18 (SUTURE) ×6 IMPLANT
SUT VIC AB 3-0 SH 18 (SUTURE) ×6 IMPLANT
TOWEL OR 17X24 6PK STRL BLUE (TOWEL DISPOSABLE) ×3 IMPLANT
TOWEL OR 17X26 10 PK STRL BLUE (TOWEL DISPOSABLE) ×3 IMPLANT
YANKAUER SUCT BULB TIP NO VENT (SUCTIONS) ×3 IMPLANT

## 2017-10-30 NOTE — Anesthesia Procedure Notes (Signed)
Anesthesia Regional Block: Pectoralis block   Pre-Anesthetic Checklist: ,, timeout performed, Correct Patient, Correct Site, Correct Laterality, Correct Procedure, Correct Position, site marked, Risks and benefits discussed,  Surgical consent,  Pre-op evaluation,  At surgeon's request and post-op pain management  Laterality: Left  Prep: chloraprep       Needles:  Injection technique: Single-shot  Needle Type: Echogenic Needle     Needle Length: 9cm  Needle Gauge: 21     Additional Needles:   Procedures:,,,, ultrasound used (permanent image in chart),,,,  Narrative:  Start time: 10/30/2017 8:07 AM End time: 10/30/2017 8:14 AM Injection made incrementally with aspirations every 5 mL.  Performed by: Personally  Anesthesiologist: Suzette Battiest, MD

## 2017-10-30 NOTE — Anesthesia Procedure Notes (Signed)
Procedure Name: LMA Insertion Date/Time: 10/30/2017 9:22 AM Performed by: Eligha Bridegroom, CRNA Pre-anesthesia Checklist: Patient identified, Emergency Drugs available, Suction available, Patient being monitored and Timeout performed Oxygen Delivery Method: Circle system utilized Preoxygenation: Pre-oxygenation with 100% oxygen Induction Type: IV induction LMA: LMA with gastric port inserted LMA Size: 4.0 Number of attempts: 1 Placement Confirmation: positive ETCO2 and breath sounds checked- equal and bilateral Tube secured with: Tape

## 2017-10-30 NOTE — Anesthesia Postprocedure Evaluation (Signed)
Anesthesia Post Note  Patient: Carolyn Parrish  Procedure(s) Performed: LEFT SIMPLE MASTECTOMY (Left Breast)     Patient location during evaluation: PACU Anesthesia Type: General Level of consciousness: awake and alert Pain management: pain level controlled Vital Signs Assessment: post-procedure vital signs reviewed and stable Respiratory status: spontaneous breathing, nonlabored ventilation, respiratory function stable and patient connected to nasal cannula oxygen Cardiovascular status: blood pressure returned to baseline and stable Postop Assessment: no apparent nausea or vomiting Anesthetic complications: no    Last Vitals:  Vitals:   10/30/17 1145 10/30/17 1208  BP: 110/62 113/60  Pulse: 76 76  Resp: 13 18  Temp: (!) 36.3 C 36.6 C  SpO2: 100% 91%    Last Pain:  Vitals:   10/30/17 1412  TempSrc:   PainSc: 0-No pain                 Tiajuana Amass

## 2017-10-30 NOTE — Op Note (Signed)
Preoperative diagnosis: Stage II left breast cancer  Postoperative diagnosis: Same  Procedure: Left simple mastectomy  Surgeon: Erroll Luna, MD  Anesthesia: General with pectoral block  EBL: 100 cc  Specimen: Left breast tissue with level 1 left axillary contents  Drains: two 19 round drains  IV fluids: Per anesthesia record  Indications for procedure: The patient 77 year old female with significant debilitation secondary to stroke who presents with left breast cancer.  This was a locally advanced tumor involving her nipple and we did not feel she would be a good radiation of breast conservation candidate.  She is wheelchair-bound.  Recommend a left simple mastectomy for local control and potentially antiestrogen therapy postoperatively depending on final pathology.  Risk, benefits and other options discussed with the family and patient.  She agreed to proceed.The surgical and non surgical options have been discussed with the patient.  Risks of surgery include bleeding,  Infection,  Flap necrosis,  Tissue loss,  Chronic pain, death, Numbness,  And the need for additional procedures.  Reconstruction options also have been discussed with the patient as well.  The patient agrees to proceed.     Description of procedure: The patient was met in the holding area.  The left side was marked as the correct side.  She underwent a pectoral block by anesthesia.  She was taken back to the operative room.  She was placed supine upon the operative table.  After induction of general anesthesia, left breast was prepped and draped in sterile fashion and timeout was done.  She received preoperative antibiotics.  Curvilinear incisions were made above and below the nipple areolar complex.  The superior skin flap was taken to the clavicle and the inferior inferior skin flap was taken to the inferior mammary fold.  The breast was then dissected off the chest wall in a medial to lateral fashion until we were  lateral to the pectoralis minor where the lateral attachments were amputated.  A small amount of left x-ray contents were removed with the majority of the level 2 contents were left in situ.  Irrigation was used.  She was having a significant amount of oozing secondary to aspirin use.  Arista was placed for hemostasis as well as cautery.  We had good hemostasis.  Through 2 separate inferior flap stab incisions 19 round drains were placed and secured with 2-0 nylon.  The wound was closed with 3-0 Vicryl and 4-0 Monocryl.  All final counts were found to be correct.  The skin flaps are viable and there is no evidence of ongoing hematoma or bleeding.  Breast binder placed.  The patient was then awoke extubated taken to recovery in satisfactory condition.

## 2017-10-30 NOTE — H&P (Signed)
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 Of note the patient has markedly limited mobility status post stroke and is wheelchair bound. The best possible images were obtained.  EXAM: DIGITAL DIAGNOSTIC BILATERAL MAMMOGRAM WITH CAD AND TOMO  ULTRASOUND LEFT BREAST  COMPARISON: Previous exam(s).  ACR Breast Density Category c: The breast tissue is heterogeneously dense, which may obscure small masses.  FINDINGS: Limited bilateral MLO images were obtained. CC images could not be performed due to the patient's physical limitations and body habitus. A spiculated hyperdense mass is identified in the superficial subareolar left breast. It localizes medially on tomosynthesis and demonstrates nipple in skin involvement.  Limited evaluation of the right breast is unremarkable.  Mammographic images were processed with CAD.  On physical exam, I palpate a hard, immobile 2-3 cm mass in the medial periareolar region. The lump is visible within the skin and appears to involve the nipple.  Targeted ultrasound is performed, showing an irregular hypoechoic mass with associated vascularity at the 9 o'clock retroareolar region. It measures approximately 2.8 x 1.6 x 1.0 cm. This mass abuts the overlying skin surface and involves the medial aspect of the nipple.  Evaluation of the left axilla demonstrates no suspicious lymphadenopathy.  IMPRESSION: 1. Suspicious left breast mass involving the superficial periareolar tissues and medial aspect of the left nipple. Recommendation is for ultrasound-guided biopsy. 2. No suspicious left  axillary lymphadenopathy. 3. Limited mammographic evaluation of the right breast is grossly unremarkable.  RECOMMENDATION: Ultrasound-guided biopsy for a suspicious periareolar left breast mass.  I have discussed the findings and recommendations with the patient. Results were also provided in writing at the conclusion of the visit. If applicable, a reminder letter will be sent to the patient regarding the next appointment.  BI-RADS CATEGORY 4: Suspicious.   Electronically Signed By: Kristopher Oppenheim M.Parrish. 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 HER2 FISH was performed by a technologist and cell imaging and analysis on the [employer]. First Analysis: RATIO OF HER2/CEP17 SIGNALS 1.70 AVERAGE HER2 COPY NUMBER PER CELL 4.75 An additional 20 cells was analyzed by FISH in a separate region with invasive cancer. Additional Analysis: RATIO OF HER2/CEP17 SIGNALS 1.89 AVERAGE HER2 COPY NUMBER PER CELL 4.93 The ratio of HER-2 CEP 17 is within the range < 2.0 of HER-2: CEP17 in normal breast tissue and a copy number of HER2 signals per cell is >=4.0 and < 6.0. Arch Pathol Lab Med 1:1,2018 COMMENT: It is uncertain whether patients with >=4.0 and < 6.0 average HER2 signals/cell and HER2/CEP17 ratio < 2.0 benefit from HER2 targeted therapy in the absence of protein overexpression (IHC 3+). If the specimen test result is close to the ISH ratio threshold for positive, there is a high likelihood that repeat testing will result in different results by chance alone. Therefore, when [facility] results are not 3+ positive, it is recommended that the sample be considered HER2 negative without additional testing on  the same specimen. Thressa Sheller MD Pathologist,  Electronic Signature ( Signed 09/11/2017) By immunohistochemistry, the tumor cells are equivocal for Her2 (2+). Her2 FISH has been ordered. 1 of 3 FINAL for Carolyn Parrish, Carolyn Parrish ([id number]) ADDITIONAL INFORMATION:(continued) Vicente Males MD Pathologist, Electronic Signature ( Signed 09/11/2017) PROGNOSTIC INDICATORS Results: IMMUNOHISTOCHEMICAL AND MORPHOMETRIC ANALYSIS PERFORMED MANUALLY Estrogen Receptor: 100%, POSITIVE, STRONG STAINING INTENSITY Progesterone Receptor: 80%, POSITIVE, STRONG STAINING INTENSITY Proliferation Marker Ki67: 15% REFERENCE RANGE ESTROGEN RECEPTOR NEGATIVE 0% POSITIVE =>1% REFERENCE RANGE PROGESTERONE RECEPTOR NEGATIVE 0% POSITIVE =>1% All controls stained appropriately Thressa Sheller MD Pathologist, Electronic Signature ( Signed 09/10/2017) FINAL DIAGNOSIS Diagnosis Breast, left, needle core biopsy, 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 - SEE COMMENT. Microscopic Comment The carcinoma appears grade II. A breast prognostic profile will be performed and the results reported separately. The results were called to the [facility] on 09/06/17. (JBK:kh 09/06/17) Enid Cutter MD Pathologist, Electronic Signature (Case signed 09/06/2017) Specimen Gross and Clinical Information Specimen Comment In formalin 1:10, extracted less than 1 minute, left breast mass, likely involving overlying skin/nipple 2 of 3 FINAL for Carolyn Parrish, Carolyn Parrish ([id number].  The patient is a 77 year old female.   Past Surgical History Tawni Pummel, RN; Breast Biopsy Left.  Diagnostic Studies History Tawni Pummel,  Mammogram within last year Pap Smear >5 years ago  Medication History Tawni Pummel, RN; Medications Reconciled  Social History [employer] [place], R Caffeine use Tea. No drug use  Family History Tawni Pummel, RN; Family history unknown First Degree Relatives  Pregnancy /  Birth History Tawni Pummel, RN;  Gravida 5  Other Problems (Tawni Pummel, RN Cholelithiasi High blood pressure     Review of Systems ([name] MD; ) General Present- Appetite Loss. Not Present- Chills, Fatigue, Fever, Night Sweats, Weight Gain and Weight Loss. Skin Not Present- Change in Wart/Mole, Dryness, Hives, Jaundice, New Lesions, Non-Healing Wounds, Rash and Ulcer. HEENT Not Present- Earache, Hearing Loss, Hoarseness, Nose Bleed, Oral Ulcers, Ringing in the Ears, Seasonal Allergies, Sinus Pain, Sore Throat, Visual Disturbances, Wears glasses/contact lenses and Yellow Eyes. Respiratory Present- Chronic Cough. Not Present- Bloody sputum, Difficulty Breathing, Snoring and Wheezing. Breast Not Present- Breast Mass, Breast Pain, Nipple Discharge and Skin Changes. Gastrointestinal Present- Chronic diarrhea. Not Present- Abdominal Pain, Bloating, Bloody Stool, Change in Bowel Habits, Constipation, Difficulty Swallowing, Excessive gas, Gets full quickly at meals, Hemorrhoids, Indigestion, Nausea, Rectal Pain and Vomiting. Musculoskeletal Present- Muscle Weakness. Not Present- Back Pain, Joint Pain, Joint Stiffness, Muscle Pain and Swelling of Extremities. Neurological Present- Decreased Memory and Trouble walking. Not Present- Fainting, Headaches, Numbness, Seizures, Tingling, Tremor and Weakness. Psychiatric Present- Bipolar. Not Present- Anxiety, Change in Sleep Pattern, Depression, Fearful and Frequent crying. Endocrine Present- Hot flashes. Not Present- Cold Intolerance, Excessive Hunger, Hair Changes, Heat Intolerance and New Diabetes. Hematology Not Present- Blood Thinners, Easy Bruising, Excessive bleeding, Gland problems, HIV and Persistent Infections. All other systems negative   Physical Exam (Taylor Spilde A. Layken Beg MD;   General Mental Status-Alert. General Appearance-Consistent with stated age. Hydration-Well hydrated. Voice-Normal. Note: wheel  chair bound  Head and Neck Head-normocephalic, atraumatic with no lesions or palpable masses. Trachea-midline. Thyroid Gland Characteristics - normal size and consistency.  Eye Eyeball - Bilateral-Extraocular movements intact. Sclera/Conjunctiva - Bilateral-No scleral icterus.  Chest and Lung Exam Chest and lung exam reveals -quiet, even and easy respiratory effort with no use of accessory muscles and on auscultation, normal breath sounds, no adventitious sounds and normal vocal  resonance. Inspection Chest Wall - Normal. Back - normal.  Breast Note: large left central breast mass 3 cm no fixed right breast normal  Cardiovascular Cardiovascular examination reveals -normal heart sounds, regular rate and rhythm with no murmurs and normal pedal pulses bilaterally.  Musculoskeletal Normal Exam - Left-Upper Extremity Strength Normal and Lower Extremity Strength Normal. Normal Exam - Right-Upper Extremity Strength Normal and Lower Extremity Strength Normal.  Lymphatic Head & Neck  General Head & Neck Lymphatics: Bilateral - Description - Normal. Axillary  General Axillary Region: Bilateral - Description - Normal. Tenderness - Non Tender.    Assessment & Plan (Kelcie Currie A. Co  BREAST CANCER, left  (C50.911) Impression: pt not a good radiation candidate recommed left mastectomy simple   Discussed treatment options for breast cancer to include breast conservation vs mastectomy with reconstruction. Pt has decided on mastectomy. Risk include bleeding, infection, flap necrosis, pain, numbness, recurrence, hematoma, other surgery needs. Pt understands and agrees to proceed.  Current Plans You are being scheduled for surgery- Our schedulers will call you.  You should hear from our office's scheduling department within 5 working days about the location, date, and time of surgery. We try to make accommodations for patient's preferences in scheduling  surgery, but sometimes the OR schedule or the surgeon's schedule prevents [place] from making those accommodations.  If you have not heard from our office [phone]) in 5 working days, call the office and ask for your surgeon's nurse.  If you have other questions about your diagnosis, plan, or surgery, call the office and ask for your surgeon's nurse.  Pt Education - [facility] Breast Cancer Information Given - [employer] "Breast Journey" Package Pt Education - [facility] Mastectomy HCI

## 2017-10-30 NOTE — Progress Notes (Signed)
CRNA at bedside. Pt BP 72/60. 137mcg Neo given by CRNA.

## 2017-10-30 NOTE — Transfer of Care (Signed)
Immediate Anesthesia Transfer of Care Note  Patient: Carolyn Parrish  Procedure(s) Performed: LEFT SIMPLE MASTECTOMY (Left Breast)  Patient Location: PACU  Anesthesia Type:General  Level of Consciousness: awake and alert   Airway & Oxygen Therapy: Patient Spontanous Breathing and Patient connected to nasal cannula oxygen  Post-op Assessment: Report given to RN and Post -op Vital signs reviewed and stable  Post vital signs: Reviewed and stable  Last Vitals:  Vitals Value Taken Time  BP 72/60 10/30/2017 10:53 AM  Temp    Pulse 72 10/30/2017 11:00 AM  Resp 11 10/30/2017 11:00 AM  SpO2 100 % 10/30/2017 11:00 AM  Vitals shown include unvalidated device data.  Last Pain:  Vitals:   10/30/17 0739  PainSc: 0-No pain         Complications: No apparent anesthesia complications

## 2017-10-30 NOTE — Interval H&P Note (Signed)
History and Physical Interval Note:  10/30/2017 9:03 AM  Carolyn Parrish  has presented today for surgery, with the diagnosis of LEFT BREAST CANCER  The various methods of treatment have been discussed with the patient and family. After consideration of risks, benefits and other options for treatment, the patient has consented to  Procedure(s): LEFT SIMPLE MASTECTOMY (Left) as a surgical intervention .  The patient's history has been reviewed, patient examined, no change in status, stable for surgery.  I have reviewed the patient's chart and labs.  Questions were answered to the patient's satisfaction.     [place]

## 2017-10-31 ENCOUNTER — Ambulatory Visit: Payer: Medicare Other | Admitting: Cardiology

## 2017-10-31 ENCOUNTER — Encounter (HOSPITAL_COMMUNITY): Payer: Self-pay | Admitting: Surgery

## 2017-10-31 ENCOUNTER — Other Ambulatory Visit: Payer: Self-pay

## 2017-10-31 DIAGNOSIS — C50911 Malignant neoplasm of unspecified site of right female breast: Secondary | ICD-10-CM | POA: Diagnosis not present

## 2017-10-31 LAB — BASIC METABOLIC PANEL
Anion gap: 6 (ref 5–15)
BUN: 10 mg/dL (ref 8–23)
CALCIUM: 6.6 mg/dL — AB (ref 8.9–10.3)
CO2: 22 mmol/L (ref 22–32)
CREATININE: 1.09 mg/dL — AB (ref 0.44–1.00)
Chloride: 111 mmol/L (ref 98–111)
GFR calc non Af Amer: 48 mL/min — ABNORMAL LOW (ref >60)
GFR, EST AFRICAN AMERICAN: 56 mL/min — AB (ref >60)
Glucose, Bld: 810 mg/dL (ref 70–99)
Potassium: 4.2 mmol/L (ref 3.5–5.1)
Sodium: 139 mmol/L (ref 135–145)

## 2017-10-31 LAB — CBC
HEMATOCRIT: 33.7 % — AB (ref 36.0–46.0)
Hemoglobin: 10.1 g/dL — ABNORMAL LOW (ref 12.0–15.0)
MCH: 31.2 pg (ref 26.0–34.0)
MCHC: 30 g/dL (ref 30.0–36.0)
MCV: 104 fL — ABNORMAL HIGH (ref 78.0–100.0)
Platelets: 224 10*3/uL (ref 150–400)
RBC: 3.24 MIL/uL — ABNORMAL LOW (ref 3.87–5.11)
RDW: 15.6 % — ABNORMAL HIGH (ref 11.5–15.5)
WBC: 19.1 10*3/uL — ABNORMAL HIGH (ref 4.0–10.5)

## 2017-10-31 LAB — GLUCOSE, CAPILLARY: Glucose-Capillary: 177 mg/dL — ABNORMAL HIGH (ref 70–99)

## 2017-10-31 MED ORDER — OXYCODONE HCL 5 MG PO TABS: 5.0000 mg | ORAL_TABLET | ORAL | 0 refills | Status: DC | PRN

## 2017-10-31 NOTE — Discharge Instructions (Signed)
[facility] [place] °[phone] ° °MASTECTOMY: POST OP INSTRUCTIONS ° °Always review your discharge instruction sheet given to you by the facility where your surgery was performed. °IF YOU HAVE DISABILITY OR FAMILY LEAVE FORMS, YOU MUST BRING THEM TO THE OFFICE FOR PROCESSING.   °DO NOT GIVE THEM TO YOUR DOCTOR. °A prescription for pain medication may be given to you upon discharge.  Take your pain medication as prescribed, if needed.  If narcotic pain medicine is not needed, then you may take acetaminophen (Tylenol) or ibuprofen (Advil) as needed. °1. Take your usually prescribed medications unless otherwise directed. °2. If you need a refill on your pain medication, please contact your pharmacy.  They will contact our office to request authorization.  Prescriptions will not be filled after 5pm or on week-ends. °3. You should follow a light diet the first few days after arrival home, such as soup and crackers, etc.  Resume your normal diet the day after surgery. °4. Most patients will experience some swelling and bruising on the chest and underarm.  Ice packs will help.  Swelling and bruising can take several days to resolve.  °5. It is common to experience some constipation if taking pain medication after surgery.  Increasing fluid intake and taking a stool softener (such as Colace) will usually help or prevent this problem from occurring.  A mild laxative (Milk of Magnesia or Miralax) should be taken according to package instructions if there are no bowel movements after 48 hours. °6. Unless discharge instructions indicate otherwise, leave your bandage dry and in place until your next appointment in 3-5 days.  You may take a limited sponge bath.  No tube baths or showers until the drains are removed.  You may have steri-strips (small skin tapes) in place directly over the incision.  These strips should be left on the skin for 7-10 days.  If your surgeon used skin glue on the incision, you may  shower in 24 hours.  The glue will flake off over the next 2-3 weeks.  Any sutures or staples will be removed at the office during your follow-up visit. °7. DRAINS:  If you have drains in place, it is important to keep a list of the amount of drainage produced each day in your drains.  Before leaving the hospital, you should be instructed on drain care.  Call our office if you have any questions about your drains. °8. ACTIVITIES:  You may resume regular (light) daily activities beginning the next day--such as daily self-care, walking, climbing stairs--gradually increasing activities as tolerated.  You may have sexual intercourse when it is comfortable.  Refrain from any heavy lifting or straining until approved by your doctor. °a. You may drive when you are no longer taking prescription pain medication, you can comfortably wear a seatbelt, and you can safely maneuver your car and apply brakes. °b. RETURN TO WORK:  __________________________________________________________ °9. You should see your doctor in the office for a follow-up appointment approximately 3-5 days after your surgery.  Your doctor’s nurse will typically make your follow-up appointment when she calls you with your pathology report.  Expect your pathology report 2-3 business days after your surgery.  You may call to check if you do not hear from us after three days.   °10. OTHER INSTRUCTIONS: ______________________________________________________________________________________________ ____________________________________________________________________________________________ °WHEN TO CALL YOUR DOCTOR: °1. Fever over 101.0 °2. Nausea and/or vomiting °3. Extreme swelling or bruising °4. Continued bleeding from incision. °5. Increased pain, redness, or drainage from the incision. °  The clinic staff is available to answer your questions during regular business hours.  Please dont hesitate to call and ask to speak to one of the nurses for clinical  concerns.  If you have a medical emergency, go to the nearest emergency room or call 911.  A surgeon from [facility] is always on call at the hospital. [address] [place], [place], [place]  [address] ? [address], [place], [place]   [address] [phone] ? [phone] ? FAX [phone] Web site: [place] Surgical drains are used to remove extra fluid that normally builds up in a surgical wound after surgery. A surgical drain helps to heal a surgical wound. Different kinds of surgical drains include:  Active drains. These drains use suction to pull drainage away from the surgical wound. Drainage flows through a tube to a container outside of the body. It is important to keep the bulb or the drainage container flat (compressed) at all times, except while you empty it. Flattening the bulb or container creates suction. The two most common types of active drains are bulb drains and Hemovac drains.  Passive drains. These drains allow fluid to drain naturally, by gravity. Drainage flows through a tube to a bandage (dressing) or a container outside of the body. Passive drains do not need to be emptied. The most common type of passive drain is the Penrose drain.  A drain is placed during surgery. Immediately after surgery, drainage is usually bright red and a little thicker than water. The drainage may gradually turn yellow or pink and become thinner. It is likely that your health care provider will remove the drain when the drainage stops or when the amount decreases to 1-2 Tbsp (15-30 mL) during a 24-hour period. How to care for your surgical drain  Keep the skin around the drain dry and covered with a dressing at all times.  Check your drain area every day for signs of infection. Check for: ? More redness, swelling, or pain. ? Pus or a bad smell. ? Cloudy drainage. Follow instructions from your health care provider about how to take care of your  drain and how to change your dressing. Change your dressing at least one time every day. Change it more often if needed to keep the dressing dry. Make sure you: 1. Gather your supplies, including: ? Tape. ? Germ-free cleaning solution (sterile saline). ? Split gauze drain sponge: 4 x 4 inches (10 x 10 cm). ? Gauze square: 4 x 4 inches (10 x 10 cm). 2. Wash your hands with soap and water before you change your dressing. If soap and water are not available, use hand sanitizer. 3. Remove the old dressing. Avoid using scissors to do that. 4. Use sterile saline to clean your skin around the drain. 5. Place the tube through the slit in a drain sponge. Place the drain sponge so that it covers your wound. 6. Place the gauze square or another drain sponge on top of the drain sponge that is on the wound. Make sure the tube is between those layers. 7. Tape the dressing to your skin. 8. If you have an active bulb or Hemovac drain, tape the drainage tube to your skin 1-2 inches (2.5-5 cm) below the place where the tube enters your body. Taping keeps the tube from pulling on any stitches (sutures) that you have. 9. Wash your hands with soap and water. 10. Write down the color of your drainage  and how often you change your dressing. ° °How to empty your active bulb or Hemovac drain °1. Make sure that you have a measuring cup that you can empty your drainage into. °2. Wash your hands with soap and water. If soap and water are not available, use hand sanitizer. °3. Gently move your fingers down the tube while squeezing very lightly. This is called stripping the tube. This clears any drainage, clots, or tissue from the tube. °? Do not pull on the tube. °? You may need to strip the tube several times every day to keep the tube clear. °4. Open the bulb cap or the drain plug. Do not touch the inside of the cap or the bottom of the plug. °5. Empty all of the drainage into the measuring cup. °6. Compress the bulb or the  container and replace the cap or the plug. To compress the bulb or the container, squeeze it firmly in the middle while you close the cap or plug the container. °7. Write down the amount of drainage that you have in each 24-hour period. If you have less than 2 Tbsp (30 mL) of drainage during 24 hours, contact your health care provider. °8. Flush the drainage down the toilet. °9. Wash your hands with soap and water. °Contact a health care provider if: °· You have more redness, swelling, or pain around your drain area. °· The amount of drainage that you have is increasing instead of decreasing. °· You have pus or a bad smell coming from your drain area. °· You have a fever. °· You have drainage that is cloudy. °· There is a sudden stop or a sudden decrease in the amount of drainage that you have. °· Your tube falls out. °· Your active drain does not stay compressed after you empty it. °This information is not intended to replace advice given to you by your health care provider. Make sure you discuss any questions you have with your health care provider. °Document Released: 01/21/2000 Document Revised: 07/01/2015 Document Reviewed: 08/12/2014 °Elsevier Interactive Patient Education © 2018 [employer] ° °

## 2017-10-31 NOTE — Discharge Summary (Signed)
Physician Discharge Summary  Patient ID: Carolyn Parrish MRN: [id number] DOB/AGE: [birth year] 77 y.o.  Admit date: 10/30/2017 Discharge date: 10/31/2017  Admission Diagnoses:LEFT BREAST CANCER  Discharge Diagnoses:  Active Problems:   Breast cancer, stage 2, left [facility])   Discharged Condition: good  Hospital Course: Pt did well post op.  She had 1 erroneous glucose level that was rechecked and was 177.  She tolerated her diet and had good pain control.     Treatments: surgery: left simple mastectomy   Discharge Exam: Blood pressure (!) 77/59, pulse 79, temperature 98.6 F (37 C), temperature source Oral, resp. rate 18, height 5\' 10"  (1.778 m), weight 93 kg, SpO2 95 %. General appearance: alert and cooperative Resp: clear to auscultation bilaterally Cardio: regular rate and rhythm, S1, S2 normal, no murmur, click, rub or gallop Neurologic: Gait: wheel chair bound due to previous CVA Incision/Wound:FLAPS VIABLE  DRAINS SEROUS NO SIGNS OF HEMATOMA   Disposition:       Signed: Nykira Reddix A Samanvitha [place] 10/31/2017, 7:35 AM

## 2017-10-31 NOTE — Discharge Summary (Signed)
Physician Discharge Summary  Patient ID: Carolyn Parrish MRN: [id number] DOB/AGE: Oct 13, 1940 77 y.o.  Admit date: 10/30/2017 Discharge date: 10/31/2017  Admission Diagnoses:see below Discharge Diagnoses:  Active Problems:   Breast cancer, stage 2, left [facility])   Discharged Condition: good  Hospital Course: Pt did well after left mastectomy  She tolerated her diet and had good pain control.  Wounds were clean dry and inatct  Consults: None    Treatments: surgery: left mastectomy   Discharge Exam: Blood pressure 121/63, pulse 83, temperature 99.5 F (37.5 C), temperature source Oral, resp. rate 16, height 5\' 10"  (1.778 m), weight 93 kg, SpO2 100 %. Incision/Wound:CDI flaps viable   Disposition: Discharge disposition: 01-Home or Self Care       Discharge Instructions    Diet - low sodium heart healthy   Complete by:  As directed    Increase activity slowly   Complete by:  As directed      Allergies as of 10/31/2017      Reactions   Atorvastatin    UNSPECIFIED REACTION       Medication List    TAKE these medications   acetaminophen 325 MG tablet Commonly known as:  TYLENOL Take 650 mg by mouth every 4 (four) hours as needed (Arthritis pain). Give 2 tablets by mouth every 4 hours as needed for arthritis pain.   ascorbic acid 500 MG tablet Commonly known as:  VITAMIN C Take 500 mg by mouth 2 (two) times daily.   aspirin 81 MG chewable tablet Chew 81 mg by mouth daily.   camphor-menthol lotion Commonly known as:  SARNA Apply 1 application topically 2 (two) times daily as needed for itching.   divalproex 250 MG 24 hr tablet Commonly known as:  DEPAKOTE ER Take 250 mg by mouth daily.   docusate sodium 100 MG capsule Commonly known as:  COLACE Take 100 mg by mouth 2 (two) times daily.   lisinopril 2.5 MG tablet Commonly known as:  PRINIVIL,ZESTRIL Take 2.5 mg by mouth daily.   magnesium hydroxide 400 MG/5ML suspension Commonly known as:  MILK OF  MAGNESIA Take 30 mLs by mouth daily as needed for mild constipation.   metFORMIN 500 MG tablet Commonly known as:  GLUCOPHAGE Take 500 mg by mouth daily.   MYLANTA 200-200-20 MG/5ML suspension Generic drug:  alum & mag hydroxide-simeth Take 30 mLs by mouth every 4 (four) hours as needed for indigestion or heartburn.   oxyCODONE 5 MG immediate release tablet Commonly known as:  Oxy IR/ROXICODONE Take 1 tablet (5 mg total) by mouth every 4 (four) hours as needed for moderate pain.   polyethylene glycol packet Commonly known as:  MIRALAX / GLYCOLAX Take 17 g by mouth daily.   PROBIOTIC DAILY PO Take 1 capsule by mouth daily.   vitamin B-12 500 MCG tablet Commonly known as:  CYANOCOBALAMIN Take 500 mcg by mouth daily.   Vitamin D (Ergocalciferol) 50000 units Caps capsule Commonly known as:  DRISDOL Take 50,000 Units by mouth every 7 (seven) days. Fridays      Follow-up Information    Tamu Golz, Marcello Moores, MD. Schedule an appointment as soon as possible for a visit in 10 day(s).   Specialty:  General Surgery Contact information: [address] [place] [address] [phone]           Signed: Turner Daniels 10/31/2017, 10:23 AM

## 2017-10-31 NOTE — Clinical Social Work Placement (Signed)
   CLINICAL SOCIAL WORK PLACEMENT  NOTE [facility] RN to call report to [phone]  Date:  10/31/2017  Patient Details  Name: Carolyn Parrish MRN: [id number] Date of Birth: 20-Nov-1940  Clinical Social Work is seeking post-discharge placement for this patient at the [place] level of care (*CSW will initial, date and re-position this form in  chart as items are completed):  Yes   Patient/family provided with [facility] Work Department's list of facilities offering this level of care within the geographic area requested by the patient (or if unable, by the patient's family).  Yes   Patient/family informed of their freedom to choose among providers that offer the needed level of care, that participate in Medicare, Medicaid or managed care program needed by the patient, have an available bed and are willing to accept the patient.  Yes   Patient/family informed of 's ownership interest in [facility] and [facility], as well as of the fact that they are under no obligation to receive care at these facilities.  PASRR submitted to EDS on       PASRR number received on       Existing PASRR number confirmed on       FL2 transmitted to all facilities in geographic area requested by pt/family on 10/31/17     FL2 transmitted to all facilities within larger geographic area on       Patient informed that his/her managed care company has contracts with or will negotiate with certain facilities, including the following:        Yes   Patient/family informed of bed offers received.  Patient chooses bed at [facility]     Physician recommends and patient chooses bed at      Patient to be transferred to [facility] on 10/31/17.  Patient to be transferred to facility by [facility]     Patient family notified on 10/31/17 of transfer.  Name of family member notified:  pt daughter [place]     PHYSICIAN        Additional Comment:    _______________________________________________ Alexander Mt, [place] 10/31/2017, 1:33 PM

## 2017-10-31 NOTE — Social Work (Signed)
Clinical Social Worker facilitated patient discharge including contacting patient family and facility to confirm patient discharge plans.  Clinical information faxed to facility and family agreeable with plan.  CSW arranged ambulance transport via [facility] to [facility]. Pick up scheduled for 3pm  RN to call [phone] with report  prior to discharge.  Clinical Social Worker will sign off for now as social work intervention is no longer needed. Please consult [place] again if new need arises.  Alexander Mt, [place] Social Worker

## 2017-10-31 NOTE — Progress Notes (Signed)
Call from lab stating patient blood glucose 810. MD Cornett made aware. Ordered to recheck blood glucose fingerstick. Could be error. Reading was 177.

## 2017-10-31 NOTE — Social Work (Signed)
CSW confirmed pt is LTC resident at [facility], spoke with pt daughter [name] and the pt and informed them of discharge orders. Pt daughter states she has spoken with [facility] regarding transportation back to facility today.   CSW aware that discharge summary does not contain discharge medications, these are needed prior to pt returning to SNF. CSW has called [facility] and left message for attending MD with triage RN.   Alexander Mt, [place] [facility] [phone]

## 2017-11-13 ENCOUNTER — Non-Acute Institutional Stay: Payer: Medicare Other | Admitting: Primary Care

## 2017-11-13 DIAGNOSIS — Z515 Encounter for palliative care: Secondary | ICD-10-CM

## 2017-11-13 NOTE — Progress Notes (Signed)
Patient was asleep most of morning following mastectomy 10 days ago. Attempted a visit with her. She awoke and began crying and asked me to return later. Will follow up with visit  to assess for palliative goals. Cyndia Skeeters AGPCNP-BC

## 2017-11-14 ENCOUNTER — Encounter (HOSPITAL_COMMUNITY): Payer: Self-pay | Admitting: Emergency Medicine

## 2017-11-14 ENCOUNTER — Emergency Department (HOSPITAL_COMMUNITY): Payer: Medicare Other

## 2017-11-14 ENCOUNTER — Inpatient Hospital Stay (HOSPITAL_COMMUNITY): Payer: Medicare Other

## 2017-11-14 ENCOUNTER — Inpatient Hospital Stay (HOSPITAL_COMMUNITY)
Admission: EM | Admit: 2017-11-14 | Discharge: 2017-11-23 | DRG: 871 | Disposition: A | Discharge status: Skilled Nursing Facility | Payer: Medicare Other | Attending: Internal Medicine | Admitting: Internal Medicine

## 2017-11-14 DIAGNOSIS — R6521 Severe sepsis with septic shock: Secondary | ICD-10-CM | POA: Diagnosis present

## 2017-11-14 DIAGNOSIS — N179 Acute kidney failure, unspecified: Secondary | ICD-10-CM | POA: Diagnosis present

## 2017-11-14 DIAGNOSIS — E785 Hyperlipidemia, unspecified: Secondary | ICD-10-CM | POA: Diagnosis present

## 2017-11-14 DIAGNOSIS — G92 Toxic encephalopathy: Secondary | ICD-10-CM | POA: Diagnosis present

## 2017-11-14 DIAGNOSIS — I69398 Other sequelae of cerebral infarction: Secondary | ICD-10-CM | POA: Diagnosis not present

## 2017-11-14 DIAGNOSIS — R4586 Emotional lability: Secondary | ICD-10-CM | POA: Diagnosis present

## 2017-11-14 DIAGNOSIS — Z8673 Personal history of transient ischemic attack (TIA), and cerebral infarction without residual deficits: Secondary | ICD-10-CM | POA: Diagnosis not present

## 2017-11-14 DIAGNOSIS — Z853 Personal history of malignant neoplasm of breast: Secondary | ICD-10-CM

## 2017-11-14 DIAGNOSIS — I110 Hypertensive heart disease with heart failure: Secondary | ICD-10-CM | POA: Diagnosis present

## 2017-11-14 DIAGNOSIS — Z978 Presence of other specified devices: Secondary | ICD-10-CM

## 2017-11-14 DIAGNOSIS — C50912 Malignant neoplasm of unspecified site of left female breast: Secondary | ICD-10-CM | POA: Diagnosis present

## 2017-11-14 DIAGNOSIS — E872 Acidosis, unspecified: Secondary | ICD-10-CM

## 2017-11-14 DIAGNOSIS — E860 Dehydration: Secondary | ICD-10-CM

## 2017-11-14 DIAGNOSIS — Z79899 Other long term (current) drug therapy: Secondary | ICD-10-CM

## 2017-11-14 DIAGNOSIS — Z87440 Personal history of urinary (tract) infections: Secondary | ICD-10-CM | POA: Diagnosis not present

## 2017-11-14 DIAGNOSIS — E2740 Unspecified adrenocortical insufficiency: Secondary | ICD-10-CM | POA: Diagnosis present

## 2017-11-14 DIAGNOSIS — A419 Sepsis, unspecified organism: Principal | ICD-10-CM | POA: Diagnosis present

## 2017-11-14 DIAGNOSIS — F259 Schizoaffective disorder, unspecified: Secondary | ICD-10-CM | POA: Diagnosis present

## 2017-11-14 DIAGNOSIS — Z7984 Long term (current) use of oral hypoglycemic drugs: Secondary | ICD-10-CM

## 2017-11-14 DIAGNOSIS — Z9012 Acquired absence of left breast and nipple: Secondary | ICD-10-CM | POA: Diagnosis not present

## 2017-11-14 DIAGNOSIS — E119 Type 2 diabetes mellitus without complications: Secondary | ICD-10-CM | POA: Diagnosis present

## 2017-11-14 DIAGNOSIS — I10 Essential (primary) hypertension: Secondary | ICD-10-CM

## 2017-11-14 DIAGNOSIS — R4189 Other symptoms and signs involving cognitive functions and awareness: Secondary | ICD-10-CM

## 2017-11-14 DIAGNOSIS — Z888 Allergy status to other drugs, medicaments and biological substances status: Secondary | ICD-10-CM

## 2017-11-14 DIAGNOSIS — Z7982 Long term (current) use of aspirin: Secondary | ICD-10-CM | POA: Diagnosis not present

## 2017-11-14 DIAGNOSIS — F319 Bipolar disorder, unspecified: Secondary | ICD-10-CM | POA: Diagnosis present

## 2017-11-14 DIAGNOSIS — Z452 Encounter for adjustment and management of vascular access device: Secondary | ICD-10-CM | POA: Diagnosis not present

## 2017-11-14 DIAGNOSIS — F250 Schizoaffective disorder, bipolar type: Secondary | ICD-10-CM | POA: Diagnosis not present

## 2017-11-14 DIAGNOSIS — Z170 Estrogen receptor positive status [ER+]: Secondary | ICD-10-CM

## 2017-11-14 DIAGNOSIS — I959 Hypotension, unspecified: Secondary | ICD-10-CM

## 2017-11-14 DIAGNOSIS — R6520 Severe sepsis without septic shock: Secondary | ICD-10-CM

## 2017-11-14 DIAGNOSIS — I509 Heart failure, unspecified: Secondary | ICD-10-CM | POA: Diagnosis present

## 2017-11-14 DIAGNOSIS — I69319 Unspecified symptoms and signs involving cognitive functions following cerebral infarction: Secondary | ICD-10-CM | POA: Diagnosis not present

## 2017-11-14 DIAGNOSIS — N390 Urinary tract infection, site not specified: Secondary | ICD-10-CM | POA: Diagnosis present

## 2017-11-14 LAB — COMPREHENSIVE METABOLIC PANEL
ALBUMIN: 2.8 g/dL — AB (ref 3.5–5.0)
ALK PHOS: 75 U/L (ref 38–126)
ALT: 29 U/L (ref 0–44)
ANION GAP: 17 — AB (ref 5–15)
AST: 31 U/L (ref 15–41)
BUN: 29 mg/dL — AB (ref 8–23)
CALCIUM: 8.3 mg/dL — AB (ref 8.9–10.3)
CO2: 18 mmol/L — AB (ref 22–32)
CREATININE: 3.28 mg/dL — AB (ref 0.44–1.00)
Chloride: 101 mmol/L (ref 98–111)
GFR calc Af Amer: 15 mL/min — ABNORMAL LOW (ref >60)
GFR calc non Af Amer: 13 mL/min — ABNORMAL LOW (ref >60)
GLUCOSE: 127 mg/dL — AB (ref 70–99)
Potassium: 4.7 mmol/L (ref 3.5–5.1)
SODIUM: 136 mmol/L (ref 135–145)
Total Bilirubin: 0.8 mg/dL (ref 0.3–1.2)
Total Protein: 6.6 g/dL (ref 6.5–8.1)

## 2017-11-14 LAB — CBC WITH DIFFERENTIAL/PLATELET
ABS IMMATURE GRANULOCYTES: 0.58 10*3/uL — AB (ref 0.00–0.07)
Basophils Absolute: 0.1 10*3/uL (ref 0.0–0.1)
Basophils Relative: 0 %
Eosinophils Absolute: 0 10*3/uL (ref 0.0–0.5)
Eosinophils Relative: 0 %
HCT: 44.6 % (ref 36.0–46.0)
HEMOGLOBIN: 13.1 g/dL (ref 12.0–15.0)
IMMATURE GRANULOCYTES: 2 %
LYMPHS ABS: 0.6 10*3/uL — AB (ref 0.7–4.0)
Lymphocytes Relative: 2 %
MCH: 29.2 pg (ref 26.0–34.0)
MCHC: 29.4 g/dL — ABNORMAL LOW (ref 30.0–36.0)
MCV: 99.6 fL (ref 80.0–100.0)
MONOS PCT: 3 %
Monocytes Absolute: 1.1 10*3/uL — ABNORMAL HIGH (ref 0.1–1.0)
NEUTROS PCT: 93 %
Neutro Abs: 32.5 10*3/uL — ABNORMAL HIGH (ref 1.7–7.7)
Platelets: 372 10*3/uL (ref 150–400)
RBC: 4.48 MIL/uL (ref 3.87–5.11)
RDW: 16.9 % — ABNORMAL HIGH (ref 11.5–15.5)
WBC: 35 10*3/uL — ABNORMAL HIGH (ref 4.0–10.5)
nRBC: 0 % (ref 0.0–0.2)

## 2017-11-14 LAB — URINALYSIS, ROUTINE W REFLEX MICROSCOPIC
BILIRUBIN URINE: NEGATIVE
GLUCOSE, UA: NEGATIVE mg/dL
KETONES UR: NEGATIVE mg/dL
NITRITE: NEGATIVE
Protein, ur: 100 mg/dL — AB
Specific Gravity, Urine: 1.023 (ref 1.005–1.030)
pH: 5 (ref 5.0–8.0)

## 2017-11-14 LAB — I-STAT CG4 LACTIC ACID, ED
Lactic Acid, Venous: 2.94 mmol/L (ref 0.5–1.9)
Lactic Acid, Venous: 9.03 mmol/L (ref 0.5–1.9)
Lactic Acid, Venous: 1.68 mmol/L (ref 0.5–1.9)

## 2017-11-14 LAB — PROCALCITONIN: PROCALCITONIN: 1.76 ng/mL

## 2017-11-14 LAB — MRSA PCR SCREENING: MRSA BY PCR: NEGATIVE

## 2017-11-14 LAB — GLUCOSE, CAPILLARY: Glucose-Capillary: 101 mg/dL — ABNORMAL HIGH (ref 70–99)

## 2017-11-14 LAB — CORTISOL: Cortisol, Plasma: 16.4 ug/dL

## 2017-11-14 MED ORDER — DOCUSATE SODIUM 100 MG PO CAPS
100.0000 mg | ORAL_CAPSULE | Freq: Two times a day (BID) | ORAL | Status: DC
  Administered 2017-11-14 – 2017-11-23 (×13): 100 mg via ORAL
  Filled 2017-11-14 (×13): qty 1

## 2017-11-14 MED ORDER — FAMOTIDINE IN NACL 20-0.9 MG/50ML-% IV SOLN
20.0000 mg | INTRAVENOUS | Status: DC
  Administered 2017-11-14: 20 mg via INTRAVENOUS
  Filled 2017-11-14: qty 50

## 2017-11-14 MED ORDER — ACETAMINOPHEN 325 MG PO TABS
650.0000 mg | ORAL_TABLET | Freq: Once | ORAL | Status: AC
  Administered 2017-11-14: 650 mg via ORAL
  Filled 2017-11-14: qty 2

## 2017-11-14 MED ORDER — NOREPINEPHRINE 4 MG/250ML-% IV SOLN
0.0000 ug/min | INTRAVENOUS | Status: DC
  Administered 2017-11-14: 18 ug/min via INTRAVENOUS
  Administered 2017-11-14: 2 ug/min via INTRAVENOUS
  Administered 2017-11-15 (×2): 8 ug/min via INTRAVENOUS
  Administered 2017-11-16: 3 ug/min via INTRAVENOUS
  Filled 2017-11-14 (×6): qty 250

## 2017-11-14 MED ORDER — PIPERACILLIN-TAZOBACTAM IN DEX 2-0.25 GM/50ML IV SOLN
2.2500 g | Freq: Four times a day (QID) | INTRAVENOUS | Status: DC
  Administered 2017-11-14 – 2017-11-15 (×3): 2.25 g via INTRAVENOUS
  Filled 2017-11-14 (×5): qty 50

## 2017-11-14 MED ORDER — ONDANSETRON HCL 4 MG PO TABS: 4.0000 mg | ORAL_TABLET | Freq: Four times a day (QID) | ORAL | Status: DC | PRN

## 2017-11-14 MED ORDER — SODIUM CHLORIDE 0.9 % IV BOLUS (SEPSIS)
1000.0000 mL | Freq: Once | INTRAVENOUS | Status: AC
  Administered 2017-11-14: 1000 mL via INTRAVENOUS

## 2017-11-14 MED ORDER — VANCOMYCIN HCL IN DEXTROSE 1-5 GM/200ML-% IV SOLN: 1000.0000 mg | Freq: Once | INTRAVENOUS | Status: DC

## 2017-11-14 MED ORDER — ACETAMINOPHEN 325 MG PO TABS: 650.0000 mg | ORAL_TABLET | Freq: Four times a day (QID) | ORAL | Status: DC | PRN

## 2017-11-14 MED ORDER — SODIUM CHLORIDE 0.9 % IV SOLN
2.0000 g | Freq: Once | INTRAVENOUS | Status: AC
  Administered 2017-11-14: 2 g via INTRAVENOUS
  Filled 2017-11-14: qty 2

## 2017-11-14 MED ORDER — SODIUM CHLORIDE 0.9% FLUSH
10.0000 mL | INTRAVENOUS | Status: DC | PRN
  Administered 2017-11-17: 10 mL
  Filled 2017-11-14: qty 40

## 2017-11-14 MED ORDER — ACETAMINOPHEN 650 MG RE SUPP: 650.0000 mg | Freq: Four times a day (QID) | RECTAL | Status: DC | PRN

## 2017-11-14 MED ORDER — ONDANSETRON HCL 4 MG/2ML IJ SOLN
4.0000 mg | Freq: Once | INTRAMUSCULAR | Status: AC
  Administered 2017-11-14: 4 mg via INTRAVENOUS
  Filled 2017-11-14: qty 2

## 2017-11-14 MED ORDER — SODIUM CHLORIDE 0.9 % IV SOLN: 1.0000 g | INTRAVENOUS | Status: DC

## 2017-11-14 MED ORDER — SODIUM CHLORIDE 0.9 % IV SOLN
INTRAVENOUS | Status: DC
  Administered 2017-11-14: 150 mL/h via INTRAVENOUS
  Administered 2017-11-14 – 2017-11-15 (×7): via INTRAVENOUS
  Administered 2017-11-16: 1000 mL via INTRAVENOUS
  Administered 2017-11-16 (×2): via INTRAVENOUS

## 2017-11-14 MED ORDER — POLYETHYLENE GLYCOL 3350 17 G PO PACK: 17.0000 g | PACK | Freq: Every day | ORAL | Status: DC | PRN

## 2017-11-14 MED ORDER — METRONIDAZOLE IN NACL 5-0.79 MG/ML-% IV SOLN
500.0000 mg | Freq: Three times a day (TID) | INTRAVENOUS | Status: DC
  Administered 2017-11-14: 500 mg via INTRAVENOUS
  Filled 2017-11-14: qty 100

## 2017-11-14 MED ORDER — ONDANSETRON HCL 4 MG/2ML IJ SOLN: 4.0000 mg | Freq: Four times a day (QID) | INTRAMUSCULAR | Status: DC | PRN

## 2017-11-14 MED ORDER — ENOXAPARIN SODIUM 30 MG/0.3ML ~~LOC~~ SOLN
30.0000 mg | SUBCUTANEOUS | Status: DC
  Administered 2017-11-14: 30 mg via SUBCUTANEOUS
  Filled 2017-11-14: qty 0.3

## 2017-11-14 MED ORDER — DIVALPROEX SODIUM ER 250 MG PO TB24
250.0000 mg | ORAL_TABLET | Freq: Every day | ORAL | Status: DC
  Administered 2017-11-14 – 2017-11-23 (×10): 250 mg via ORAL
  Filled 2017-11-14 (×11): qty 1

## 2017-11-14 MED ORDER — ASPIRIN 81 MG PO CHEW
81.0000 mg | CHEWABLE_TABLET | Freq: Every day | ORAL | Status: DC
  Administered 2017-11-15 – 2017-11-23 (×9): 81 mg via ORAL
  Filled 2017-11-14 (×9): qty 1

## 2017-11-14 MED ORDER — SODIUM CHLORIDE 0.9% FLUSH
10.0000 mL | Freq: Two times a day (BID) | INTRAVENOUS | Status: DC
  Administered 2017-11-14 – 2017-11-20 (×9): 10 mL
  Administered 2017-11-21: 3 mL

## 2017-11-14 MED ORDER — CHLORHEXIDINE GLUCONATE CLOTH 2 % EX PADS
6.0000 | MEDICATED_PAD | Freq: Every day | CUTANEOUS | Status: DC
  Administered 2017-11-15 – 2017-11-23 (×9): 6 via TOPICAL

## 2017-11-14 MED ORDER — SODIUM CHLORIDE 0.9 % IV BOLUS
1000.0000 mL | Freq: Once | INTRAVENOUS | Status: AC
  Administered 2017-11-14: 1000 mL via INTRAVENOUS

## 2017-11-14 MED ORDER — VANCOMYCIN HCL 10 G IV SOLR
2000.0000 mg | Freq: Once | INTRAVENOUS | Status: AC
  Administered 2017-11-14: 2000 mg via INTRAVENOUS
  Filled 2017-11-14: qty 2000

## 2017-11-14 MED ORDER — SODIUM CHLORIDE 0.9% FLUSH
3.0000 mL | Freq: Two times a day (BID) | INTRAVENOUS | Status: DC
  Administered 2017-11-14: 10 mL via INTRAVENOUS
  Administered 2017-11-15 – 2017-11-21 (×13): 3 mL via INTRAVENOUS

## 2017-11-14 MED ORDER — VANCOMYCIN HCL IN DEXTROSE 1-5 GM/200ML-% IV SOLN: 1000.0000 mg | INTRAVENOUS | Status: DC

## 2017-11-14 NOTE — ED Triage Notes (Signed)
Pt here from [facility] , pt had a left mastectomy 2 days ago , presents today with fever tachy and hypotension

## 2017-11-14 NOTE — ED Notes (Signed)
Macario Carls, POA and daughter at bedside and updated, [phone] contact info for updates

## 2017-11-14 NOTE — Procedures (Signed)
Central Venous Catheter Insertion Procedure Note Carolyn Parrish [id number] [birth year]  Procedure: Insertion of Central Venous Catheter Indications: Assessment of intravascular volume, Drug and/or fluid administration and Frequent blood sampling  Procedure Details Consent: Risks of procedure as well as the alternatives and risks of each were explained to the (patient/caregiver).  Consent for procedure obtained. Time Out: Verified patient identification, verified procedure, site/side was marked, verified correct patient position, special equipment/implants available, medications/allergies/relevent history reviewed, required imaging and test results available.  Performed Real time [place] was used to ID and cannulate vessel  Maximum sterile technique was used including antiseptics, cap, gloves, gown, hand hygiene, mask and sheet. Skin prep: Chlorhexidine; local anesthetic administered A antimicrobial bonded/coated triple lumen catheter was placed in the right internal jugular vein using the Seldinger technique.  Evaluation Blood flow good Complications: No apparent complications Patient did tolerate procedure well. Chest X-ray ordered to verify placement.  CXR: pending.  Note: by [place] evaluation the left IJ vessel was very small in caliber   Clementeen Graham 11/14/2017, 2:11 PM  Erick Colace ACNP-BC [place] Pager # [phone] OR # [phone] if no answer

## 2017-11-14 NOTE — Consult Note (Signed)
[facility] Surgery Consult/Admission Note  Carolyn Parrish 1940-03-05  [id number].    Requesting MD: Dr. 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 Brantley Stage on 09/24 with 2 drains placed who presented from SNF with report of fever, tachycardia and hypotension. Pt does not have teeth and is difficult to understand. History provided by daughter. She states her mother gets UTI's roughly every 6-8 weeks. She can tell cause the pt gets agitated. Daughter states pt was a little agitated on Monday. The pt denies any pain but gets tearful with most questions. Daughter states that is not her norm.   ED Course: Labs: WBC 35 left shift, Lactic acid 9.03, creatinine 3.28 (1.09 on 10/31/17), BUN 29, GFR 15. Tmax 102.7  ROS:  Review of Systems  Unable to perform ROS: Other  Difficult to obtain complete ROS due to dementia and lack of speech clarity.    History reviewed. No pertinent family history.  Past Medical History:  Diagnosis Date  . Cancer (HCC)    BREAST CANCER  . CHF (congestive heart failure) ([place])   . Diabetes mellitus without complication ([place])   . Heart failure ([place])   . Hyperlipidemia   . Hypertension   . Schizoaffective disorder ([place])   . Stroke [facility])     Past Surgical History:  Procedure Laterality Date  . CHOLECYSTECTOMY     2016  . MASTECTOMY COMPLETE / SIMPLE Left 10/30/2017  . SIMPLE MASTECTOMY WITH AXILLARY SENTINEL NODE BIOPSY Left 10/30/2017   Procedure: LEFT SIMPLE MASTECTOMY;  Surgeon: Erroll Luna, MD;  Location: [facility];  Service: General;  Laterality: Left;    Social History:  reports that she has never smoked. She has never used smokeless tobacco. She reports that she drank alcohol. She reports that she does not use drugs.  Allergies:  Allergies  Allergen  Reactions  . Atorvastatin     UNSPECIFIED REACTION      (Not in a hospital admission)  Blood pressure (!) 77/41, pulse (!) 105, temperature (!) 102.7 F (39.3 C), temperature source Rectal, resp. rate (!) 21, height 5' 10" (1.778 m), weight 93 kg, SpO2 100 %.  Physical Exam  Constitutional: She appears well-developed and well-nourished. She appears ill. No distress.  HENT:  Head: Normocephalic and atraumatic.  Nose: Nose normal.  Mouth/Throat: Oropharynx is clear and moist and mucous membranes are normal.  No upper or lower teeth  Eyes: Conjunctivae are normal. Right eye exhibits no discharge. Left eye exhibits no discharge. No scleral icterus.  Pupils are equal and round  Neck: Normal range of motion. Neck supple.  Cardiovascular: Regular rhythm, normal heart sounds and intact distal pulses. Tachycardia present.  No murmur heard. Pulses:      Radial pulses are 2+ on the right side, and 2+ on the left side.  Pulmonary/Chest: Effort normal and breath sounds normal. No respiratory distress. She has no wheezes. She has no rhonchi. She has no rales.  L breast: mastectomy incision appears well healing and is without drainage, surrounding erythema or signs of infection. L anterior axilla has a small mobile, firm mass noted without surrounding erythema, fluctance or induration. Drains x 2 with clear serous drainage noted in bulbs.   Abdominal: Soft. Normal appearance and bowel sounds are normal. She exhibits no distension. There is no hepatosplenomegaly.  There is no tenderness. There is no rigidity and no guarding.  Pt did not wince with palpation of abdomen but she did become emotional when I palpated  Musculoskeletal: Normal range of motion. She exhibits no edema, tenderness or deformity.  Lymphadenopathy:    She has no cervical adenopathy.  Neurological: She is alert.  Skin: Skin is warm and dry. No rash noted. She is not diaphoretic.  Psychiatric: She is agitated.  Tearful at times    Nursing note and vitals reviewed.   Results for orders placed or performed during the hospital encounter of 11/14/17 (from the past 48 hour(s))  Comprehensive metabolic panel     Status: Abnormal   Collection Time: 11/14/17  8:04 AM  Result Value Ref Range   Sodium 136 135 - 145 mmol/L   Potassium 4.7 3.5 - 5.1 mmol/L   Chloride 101 98 - 111 mmol/L   CO2 18 (L) 22 - 32 mmol/L   Glucose, Bld 127 (H) 70 - 99 mg/dL   BUN 29 (H) 8 - 23 mg/dL   Creatinine, Ser 3.28 (H) 0.44 - 1.00 mg/dL   Calcium 8.3 (L) 8.9 - 10.3 mg/dL   Total Protein 6.6 6.5 - 8.1 g/dL   Albumin 2.8 (L) 3.5 - 5.0 g/dL   AST 31 15 - 41 U/L   ALT 29 0 - 44 U/L   Alkaline Phosphatase 75 38 - 126 U/L   Total Bilirubin 0.8 0.3 - 1.2 mg/dL   GFR calc non Af Amer 13 (L) >60 mL/min   GFR calc Af Amer 15 (L) >60 mL/min    Comment: (NOTE) The eGFR has been calculated using the CKD EPI equation. This calculation has not been validated in all clinical situations. eGFR's persistently <60 mL/min signify possible Chronic Kidney Disease.    Anion gap 17 (H) 5 - 15    Comment: Performed at [facility], [place] [address], [place], [place] [address]  CBC WITH DIFFERENTIAL     Status: Abnormal   Collection Time: 11/14/17  8:04 AM  Result Value Ref Range   WBC 35.0 (H) 4.0 - 10.5 K/uL   RBC 4.48 3.87 - 5.11 MIL/uL   Hemoglobin 13.1 12.0 - 15.0 g/dL   HCT 44.6 36.0 - 46.0 %   MCV 99.6 80.0 - 100.0 fL   MCH 29.2 26.0 - 34.0 pg   MCHC 29.4 (L) 30.0 - 36.0 g/dL   RDW 16.9 (H) 11.5 - 15.5 %   Platelets 372 150 - 400 K/uL   nRBC 0.0 0.0 - 0.2 %   Neutrophils Relative % 93 %   Neutro Abs 32.5 (H) 1.7 - 7.7 K/uL   Lymphocytes Relative 2 %   Lymphs Abs 0.6 (L) 0.7 - 4.0 K/uL   Monocytes Relative 3 %   Monocytes Absolute 1.1 (H) 0.1 - 1.0 K/uL   Eosinophils Relative 0 %   Eosinophils Absolute 0.0 0.0 - 0.5 K/uL   Basophils Relative 0 %   Basophils Absolute 0.1 0.0 - 0.1 K/uL   Immature Granulocytes 2 %   Abs Immature  Granulocytes 0.58 (H) 0.00 - 0.07 K/uL   Burr Cells PRESENT    Polychromasia PRESENT     Comment: Performed at [facility], [place] [address], [place], [place] [address]  I-Stat CG4 Lactic Acid, ED  (not at  [facility])     Status: Abnormal   Collection Time: 11/14/17  8:39 AM  Result Value Ref Range   Lactic Acid, Venous  2.94 (HH) 0.5 - 1.9 mmol/L   Comment NOTIFIED PHYSICIAN   I-Stat CG4 Lactic Acid, ED  (not at  [facility])     Status: Abnormal   Collection Time: 11/14/17 10:12 AM  Result Value Ref Range   Lactic Acid, Venous 9.03 (HH) 0.5 - 1.9 mmol/L   Comment NOTIFIED PHYSICIAN   I-Stat CG4 Lactic Acid, ED     Status: None   Collection Time: 11/14/17 10:43 AM  Result Value Ref Range   Lactic Acid, Venous 1.68 0.5 - 1.9 mmol/L   Dg Chest Port 1 View  Result Date: 11/14/2017 CLINICAL DATA:  Sepsis, fever. EXAM: PORTABLE CHEST 1 VIEW COMPARISON:  None. FINDINGS: The heart size and mediastinal contours are within normal limits. Both lungs are clear. The visualized skeletal structures are unremarkable. IMPRESSION: No active disease. Electronically Signed   By: Marijo Conception, M.D.   On: 11/14/2017 08:47      Assessment/Plan Active Problems:   * No active hospital problems. *  Sepsis S/P L mastectomy, 10/30/2017, Dr. Brantley Stage - do not suspect her sepsis is related to her recent mastectomy - recommend urinalysis and culture - follow up with Dr. Brantley Stage for removal of drains at appropriate time   Kalman Drape, [facility] Surgery 11/14/2017, 11:10 AM Pager: [phone] Consults: [phone] Mon-Fri 7:00 am-4:30 pm Sat-Sun 7:00 am-11:30 am

## 2017-11-14 NOTE — ED Notes (Signed)
Attending at bedside.

## 2017-11-14 NOTE — ED Provider Notes (Signed)
[facility] EMERGENCY DEPARTMENT Provider Note   CSN: [id number] Arrival date & time: 11/14/17  0731     History   Chief Complaint Chief Complaint  Patient presents with  . Blood Infection    HPI Carolyn Parrish is a 77 y.o. female.  HPI Is from nursing home with report of fever, tachycardia and hypotension.  Reportedly, fever started yesterday.  Today was 102.  Patient was given a subcutaneous fluid bolus at the nursing home and transferred to the emergency department.  Patient is status post left mastectomy done [phone] by Dr. Brantley Stage.  On physical exam, patient has 2 drains in place.  Dr. 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  It is very difficult to understand verbally.  At times her speech is intelligible and other times she is speaking rapidly with verbal speech that appears to be baseline.  She is very alert and has good eye contact.  She does not appear confused.  She is not endorsing any focal pain.  She does tell me that "they are not taking care of her at the nursing home". Past Medical History:  Diagnosis Date  . Cancer (HCC)    BREAST CANCER  . CHF (congestive heart failure) ([place])   . Diabetes mellitus without complication ([place])   . Heart failure ([place])   . Hyperlipidemia   . Hypertension   . Schizoaffective disorder ([place])   . Stroke [facility])     Patient Active Problem List   Diagnosis Date Noted  . Sepsis ([place]) 11/14/2017  . Breast cancer, stage 2, left ([place]) 10/30/2017  . Goals of care, counseling/discussion 09/12/2017  . CVA (cerebrovascular accident) ([place]) 09/12/2017  . Diabetes mellitus type 2, controlled, without complications ([place]) 87/86/7672  . Schizoaffective disorder ([place]) 09/12/2017  . Malignant neoplasm of upper-inner quadrant of left breast in female, estrogen receptor positive  ([place]) 09/07/2017    Past Surgical History:  Procedure Laterality Date  . CHOLECYSTECTOMY     2016  . MASTECTOMY COMPLETE / SIMPLE Left 10/30/2017  . SIMPLE MASTECTOMY WITH AXILLARY SENTINEL NODE BIOPSY Left 10/30/2017   Procedure: LEFT SIMPLE MASTECTOMY;  Surgeon: Erroll Luna, MD;  Location: [facility];  Service: General;  Laterality: Left;     OB History   None      Home Medications    Prior to Admission medications   Medication Sig Start Date End Date Taking? Authorizing Provider  acetaminophen (TYLENOL) 325 MG tablet Take 325-650 mg by mouth every 4 (four) hours as needed (628m for arthritis pain, 3245mfor fever >101).    Yes [provider]  alum & mag hydroxide-simeth (MYLANTA) 200-200-20 MG/5ML suspension Take 30 mLs by mouth every 4 (four) hours as needed for indigestion or heartburn.   Yes [provider]  aspirin 81 MG chewable tablet Chew 81 mg by mouth daily.   Yes [provider]  camphor-menthol (STimoteo Acelotion Apply 1 application topically 2 (two) times daily as needed for itching.   Yes [provider]  cefTRIAXone (ROCEPHIN) 1 g injection Inject 1 g into the muscle once.   Yes [provider]  divalproex (DEPAKOTE ER) 250 MG 24 hr tablet Take 250 mg by mouth daily.   Yes [provider]  docusate sodium (COLACE) 100 MG capsule Take 100 mg by mouth 2 (two) times daily.   Yes [provider]  doxycycline (VIBRAMYCIN) 100 MG capsule Take 100 mg by mouth 2 (two) times daily. x8 days.   Yes [provider]  lisinopril (PRINIVIL,ZESTRIL) 2.5 MG tablet Take 2.5 mg by mouth daily.   Yes [provider]  magnesium hydroxide (MILK OF MAGNESIA) 400 MG/5ML suspension Take 30 mLs by mouth daily as needed for mild constipation.   Yes [provider]  metFORMIN (GLUCOPHAGE) 500 MG tablet Take 500 mg by mouth daily.   Yes [provider]  oxyCODONE (OXY IR/ROXICODONE) 5 MG immediate  release tablet Take 1 tablet (5 mg total) by mouth every 4 (four) hours as needed for moderate pain. 10/31/17  Yes Rayburn, Claiborne Billings A, PA-C  polyethylene glycol (MIRALAX / GLYCOLAX) packet Take 17 g by mouth daily.   Yes [provider]  vitamin B-12 (CYANOCOBALAMIN) 500 MCG tablet Take 500 mcg by mouth daily.   Yes [provider]  Probiotic Product (PROBIOTIC DAILY PO) Take 1 capsule by mouth daily. x7 days    [provider]  sodium chloride 0.9 % infusion Inject 60 mLs into the vein 3 (three) times daily.    [provider]    Family History History reviewed. No pertinent family history.  Social History Social History   Tobacco Use  . Smoking status: Never Smoker  . Smokeless tobacco: Never Used  Substance Use Topics  . Alcohol use: Not Currently  . Drug use: Never     Allergies   Atorvastatin   Review of Systems Review of Systems Level 5 caveat, cannot obtain review of systems due to patient combination of dementia/psychiatric condition and lack of speech clarity.  Physical Exam Updated Vital Signs BP (!) 84/40   Pulse 90   Temp (!) 102.7 F (39.3 C) (Rectal)   Resp 18   Ht 5' 10" (1.778 m)   Wt 93 kg   SpO2 100%   BMI 29.42 kg/m   Physical Exam  Constitutional:  Patient is alert and interactive.  She does not show any evidence of somnolence or apparent confusion.  She is very difficult to understand due to a combination of her speech patterns and possibly old stroke.  At times however she is very easily understood.  No respiratory distress.  Color good.  Skin warm.  HENT:  Cephalic atraumatic.  Mucous membranes are pink and moist.  Airway is widely patent.  Eyes: EOM are normal.  Neck: Neck supple.  Cardiovascular:  Tachycardia.  No gross rub murmur gallop.  Pulmonary/Chest:  No respiratory distress.  Lungs are grossly clear bilaterally.  Airflow to the bases.  No obvious rales.  Patient has healing mastectomy site on the  left.  2 drains still tunneling subcutaneously.  Is some diffuse erythema around the healing wound.  It is not dehisced.  Some erythema extends into the axilla.  The arm is not edematous.  The hand is warm and dry.  Abdominal:  Abdomen is soft and nondistended.  No pain with palpation.  Musculoskeletal:  No significant peripheral edema.  The calves are soft and nontender.  Skin condition of lower extremities is good.  Neurological:  Patient is alert and very talkative.  At times her speech is very clear and content.  At other times she has a very brisk and tangential dialogue.  (This reportedly is baseline).  She follows commands appropriately.  Patient will lift each leg independently off of the bed and hold.  He is using both upper extremities symmetrically.  Skin: Skin is warm and  dry.  Psychiatric:  Patient's mood and affect is very interactive, at times slightly withdrawn, expression of mood is very labile.     ED Treatments / Results  Labs (all labs ordered are listed, but only abnormal results are displayed) Labs Reviewed  COMPREHENSIVE METABOLIC PANEL - Abnormal; Notable for the following components:      Result Value   CO2 18 (*)    Glucose, Bld 127 (*)    BUN 29 (*)    Creatinine, Ser 3.28 (*)    Calcium 8.3 (*)    Albumin 2.8 (*)    GFR calc non Af Amer 13 (*)    GFR calc Af Amer 15 (*)    Anion gap 17 (*)    All other components within normal limits  CBC WITH DIFFERENTIAL/PLATELET - Abnormal; Notable for the following components:   WBC 35.0 (*)    MCHC 29.4 (*)    RDW 16.9 (*)    Neutro Abs 32.5 (*)    Lymphs Abs 0.6 (*)    Monocytes Absolute 1.1 (*)    Abs Immature Granulocytes 0.58 (*)    All other components within normal limits  I-STAT CG4 LACTIC ACID, ED - Abnormal; Notable for the following components:   Lactic Acid, Venous 2.94 (*)    All other components within normal limits  I-STAT CG4 LACTIC ACID, ED - Abnormal; Notable for the following components:    Lactic Acid, Venous 9.03 (*)    All other components within normal limits  CULTURE, BLOOD (ROUTINE X 2)  CULTURE, BLOOD (ROUTINE X 2)  URINE CULTURE  URINALYSIS, ROUTINE W REFLEX MICROSCOPIC  CBC  CREATININE, SERUM  I-STAT CG4 LACTIC ACID, ED    EKG EKG Interpretation  Date/Time:  Wednesday November 14 2017 07:45:45 EDT Ventricular Rate:  117 PR Interval:    QRS Duration: 104 QT Interval:  323 QTC Calculation: 451 R Axis:   19 Text Interpretation:  Sinus tachycardia RSR' in V1 or V2, right VCD or RVH Borderline T abnormalities, diffuse leads agree. no STEMI no old comparison Confirmed by Charlesetta Shanks [phone]) on 11/14/2017 8:43:42 AM   Radiology Dg Chest Port 1 View  Result Date: 11/14/2017 CLINICAL DATA:  Sepsis, fever. EXAM: PORTABLE CHEST 1 VIEW COMPARISON:  None. FINDINGS: The heart size and mediastinal contours are within normal limits. Both lungs are clear. The visualized skeletal structures are unremarkable. IMPRESSION: No active disease. Electronically Signed   By: Marijo Conception, M.D.   On: 11/14/2017 08:47    Procedures Procedures (including critical care time) CRITICAL CARE Performed by: Charlesetta Shanks   Total critical care time: 45 minutes  Critical care time was exclusive of separately billable procedures and treating other patients.  Critical care was necessary to treat or prevent imminent or life-threatening deterioration.  Critical care was time spent personally by me on the following activities: development of treatment plan with patient and/or surrogate as well as nursing, discussions with consultants, evaluation of patient's response to treatment, examination of patient, obtaining history from patient or surrogate, ordering and performing treatments and interventions, ordering and review of laboratory studies, ordering and review of radiographic studies, pulse oximetry and re-evaluation of patient's condition. Medications Ordered in ED Medications    metroNIDAZOLE (FLAGYL) IVPB 500 mg (0 mg Intravenous Stopped 11/14/17 1026)  ceFEPIme (MAXIPIME) 1 g in sodium chloride 0.9 % 100 mL IVPB (has no administration in time range)  vancomycin (VANCOCIN) IVPB 1000 mg/200 mL premix (has no administration in time range)  sodium chloride 0.9 % bolus 1,000 mL (1,000 mLs Intravenous New Bag/Given 11/14/17 1056)  enoxaparin (LOVENOX) injection 30 mg (has no administration in time range)  sodium chloride flush (NS) 0.9 % injection 3 mL (has no administration in time range)  acetaminophen (TYLENOL) tablet 650 mg (has no administration in time range)    Or  acetaminophen (TYLENOL) suppository 650 mg (has no administration in time range)  polyethylene glycol (MIRALAX / GLYCOLAX) packet 17 g (has no administration in time range)  ondansetron (ZOFRAN) tablet 4 mg (has no administration in time range)    Or  ondansetron (ZOFRAN) injection 4 mg (has no administration in time range)  aspirin chewable tablet 81 mg (has no administration in time range)  divalproex (DEPAKOTE ER) 24 hr tablet 250 mg (has no administration in time range)  docusate sodium (COLACE) capsule 100 mg (has no administration in time range)  sodium chloride 0.9 % bolus 1,000 mL (0 mLs Intravenous Stopped 11/14/17 0859)    And  sodium chloride 0.9 % bolus 1,000 mL (0 mLs Intravenous Stopped 11/14/17 1015)    And  sodium chloride 0.9 % bolus 1,000 mL (1,000 mLs Intravenous New Bag/Given 11/14/17 0950)  ceFEPIme (MAXIPIME) 2 g in sodium chloride 0.9 % 100 mL IVPB (0 g Intravenous Stopped 11/14/17 0916)  vancomycin (VANCOCIN) 2,000 mg in sodium chloride 0.9 % 500 mL IVPB (0 mg Intravenous Stopped 11/14/17 1054)  acetaminophen (TYLENOL) tablet 650 mg (650 mg Oral Given 11/14/17 0942)  ondansetron (ZOFRAN) injection 4 mg (4 mg Intravenous Given 11/14/17 0924)     Initial Impression / Assessment and Plan / ED Course  I have reviewed the triage vital signs and the nursing notes.  Pertinent labs &  imaging results that were available during my care of the patient were reviewed by me and considered in my medical decision making (see chart for details).  Clinical Course as of Nov 14 1132  Wed Nov 14, 2017  0924 Consult for unassigned medical admission ordered.  Patient's blood pressures have improved with fluid resuscitation.   [MP]  [phone] Consult: Medicine has returned page for admission.  Will be seen in the emergency department for admission.  I will also add consult for general surgery.  Consult has been ordered.   [MP]  1004 Consult: General surgery PA-C Janett Billow has returned call and patient will be seen in consultation.   [MP]  5701 Repeat lactic is normal.  The lactic of 9 appears to be lab error/spurious.  Clinically, patient is not exhibiting signs of hypoperfusion.  Her mental status remains alert, no respiratory distress.  She appears to have dehydration/AKI with significantly elevated creatinine from baseline.  We will continue hydration.   [MP]    Clinical Course User Index [MP] Charlesetta Shanks, MD   Patient presents with tachycardia, fever and hypotension.  She does have drains in the left chest wall for prior mastectomy.  Presumed source of infection is postoperative.  His clinical appearance is actually good.  She is very alert and interactive.  No respiratory distress.  He has been responding to fluid resuscitation.  Pressures remain soft.  I suspect this is also AKI with dehydration.  Additional liter fluids given.  Lactic acid has normalized.  Plan for admission to medical service and consultation to surgery has been called. Final Clinical Impressions(s) / ED Diagnoses   Final diagnoses:  Sepsis, due to unspecified organism, unspecified whether acute organ dysfunction present ([place])  AKI (acute kidney injury) ([place])  ED Discharge Orders    None       Charlesetta Shanks, MD 11/14/17 1140

## 2017-11-14 NOTE — Consult Note (Signed)
PULMONARY / CRITICAL CARE MEDICINE   NAME:  TALEEYAH BORA, MRN:  [id number], DOB:  [birth year], LOS: 0 ADMISSION DATE:  11/14/2017, CONSULTATION DATE:  10/9 REFERRING MD:  , CHIEF COMPLAINT: Fever and hypotension  BRIEF HISTORY:    77 year old with difficulties in communicating at baseline who was found to be febrile at her nursing home last night.  She was given a dose of IM Rocephin and transferred to our emergency room with fever tachycardia and hypotension this morning.  Hypotension has persisted despite 4 L of crystalloid HISTORY OF PRESENT ILLNESS       History of obtained from the daughter who tells me that she is not sure that her mother would report abdominal pain cough dyspnea or dysuria even if it was present.  She is not aware that her mother has any skin breakdown.  All she knows of the current history is that she had a temperature of 104 degrees last night and was given 1 dose of intramuscular antibiotics.  The patient has had multiple episodes of UTIs in the past.  She is status post mastectomy on 9/24 and still has drains in place.  She denies any discomfort at the surgical site and in fact is not aware that she had any surgery. SIGNIFICANT PAST MEDICAL HISTORY   Bipolar disorder, CVA, hypertension, type 2 diabetes Past surgical history remarkable for cholecystectomy and mastectomy SIGNIFICANT EVENTS:   STUDIES:   Chest x-ray to my eye shows no infiltrate CULTURES:    ANTIBIOTICS:  She was dosed with cefepime Flagyl and vancomycin in the emergency room  LINES/TUBES:   At this time she has peripheral IVs only CONSULTANTS:  General surgery SUBJECTIVE:  As above  CONSTITUTIONAL: BP (!) 95/35   Pulse 91   Temp 99.3 F (37.4 C) (Rectal)   Resp 19   Ht 5\' 10"  (1.778 m)   Wt 93 kg   SpO2 100%   BMI 29.42 kg/m   No intake/output data recorded.        PHYSICAL EXAM: General: Elderly female in no obvious distress. Neuro: Very emotionally labile with garrulous  but not entirely appropriate responses to questions.  Pupils appear to eat be equal face is symmetric, she does move all fours and has a gross tremor with all movement. HEENT: No JVD Cardiovascular: S1 and S2 are regular without murmur rub or gallop there is no dependent edema Lungs: 2 drains in the left chest with slightly turbid appearing drainage in the JPs.  I do not appreciate any local tenderness. Abdomen: The abdomen is soft without any organomegaly masses or tenderness Musculoskeletal: No edema Skin:    RESOLVED PROBLEM LIST   ASSESSMENT AND PLAN       Type II diabetic status post mastectomy on 9/24 presenting with fever extreme leukocytosis and hypotension.  Unfortunately she is already received a dose of IM Rocephin complicating our work-up for potential sepsis.  UA is not obviously positive and the chest x-ray does not suggest an infiltrate.  I am going to continue to support her with antibiotics to empirically cover her urinary sources as well as surgical wound infection.  Surgery has been consulted.  She is already received 4 units of crystalloid and is still hypotensive is potentially going to require pressors therefore I will admit her to the intensive care unit.  I have discussed with daughter whether not this is appropriate considering her underlying mental status and for the moment she will least wishes [place] to proceed with aggressive  treatment.    SUMMARY OF TODAY'S PLAN:    Best Practice / Goals of Care / Disposition.     LABS  Glucose No results for input(s): GLUCAP in the last 168 hours.  BMET Recent Labs  Lab 11/14/17 0804  NA 136  K 4.7  CL 101  CO2 18*  BUN 29*  CREATININE 3.28*  GLUCOSE 127*    Liver Enzymes Recent Labs  Lab 11/14/17 0804  AST 31  ALT 29  ALKPHOS 75  BILITOT 0.8  ALBUMIN 2.8*    Electrolytes Recent Labs  Lab 11/14/17 0804  CALCIUM 8.3*    CBC Recent Labs  Lab 11/14/17 0804  WBC 35.0*  HGB 13.1  HCT 44.6  PLT 372     ABG No results for input(s): PHART, PCO2ART, PO2ART in the last 168 hours.  Coag's No results for input(s): APTT, INR in the last 168 hours.  Sepsis Markers Recent Labs  Lab 11/14/17 0839 11/14/17 1012 11/14/17 1043  LATICACIDVEN 2.94* 9.03* 1.68    Cardiac Enzymes No results for input(s): TROPONINI, PROBNP in the last 168 hours.  PAST MEDICAL HISTORY :   She  has a past medical history of Cancer ([place]), CHF (congestive heart failure) ([place]), Diabetes mellitus without complication ([place]), Heart failure ([place]), Hyperlipidemia, Hypertension, Schizoaffective disorder ([place]), and Stroke ([place]).  PAST SURGICAL HISTORY:  She  has a past surgical history that includes Cholecystectomy; Mastectomy complete / simple (Left, 10/30/2017); and Simple mastectomy with axillary sentinel node biopsy (Left, 10/30/2017).  Allergies  Allergen Reactions  . Atorvastatin     UNSPECIFIED REACTION     No current facility-administered medications on file prior to encounter.    Current Outpatient Medications on File Prior to Encounter  Medication Sig  . acetaminophen (TYLENOL) 325 MG tablet Take 325-650 mg by mouth every 4 (four) hours as needed (650mg  for arthritis pain, 325mg  for fever >101).   Marland Kitchen alum & mag hydroxide-simeth (MYLANTA) 200-200-20 MG/5ML suspension Take 30 mLs by mouth every 4 (four) hours as needed for indigestion or heartburn.  Marland Kitchen aspirin 81 MG chewable tablet Chew 81 mg by mouth daily.  . camphor-menthol (SARNA) lotion Apply 1 application topically 2 (two) times daily as needed for itching.  . cefTRIAXone (ROCEPHIN) 1 g injection Inject 1 g into the muscle once.  . divalproex (DEPAKOTE ER) 250 MG 24 hr tablet Take 250 mg by mouth daily.  Marland Kitchen docusate sodium (COLACE) 100 MG capsule Take 100 mg by mouth 2 (two) times daily.  Marland Kitchen doxycycline (VIBRAMYCIN) 100 MG capsule Take 100 mg by mouth 2 (two) times daily. x8 days.  Marland Kitchen lisinopril (PRINIVIL,ZESTRIL) 2.5 MG tablet Take 2.5 mg by mouth daily.   . magnesium hydroxide (MILK OF MAGNESIA) 400 MG/5ML suspension Take 30 mLs by mouth daily as needed for mild constipation.  . metFORMIN (GLUCOPHAGE) 500 MG tablet Take 500 mg by mouth daily.  Marland Kitchen oxyCODONE (OXY IR/ROXICODONE) 5 MG immediate release tablet Take 1 tablet (5 mg total) by mouth every 4 (four) hours as needed for moderate pain.  . polyethylene glycol (MIRALAX / GLYCOLAX) packet Take 17 g by mouth daily.  . vitamin B-12 (CYANOCOBALAMIN) 500 MCG tablet Take 500 mcg by mouth daily.  . Probiotic Product (PROBIOTIC DAILY PO) Take 1 capsule by mouth daily. x7 days  . sodium chloride 0.9 % infusion Inject 60 mLs into the vein 3 (three) times daily.    FAMILY HISTORY:   Her family history is not on file.  SOCIAL HISTORY:  She  reports that she has never smoked. She has never used smokeless tobacco. She reports that she drank alcohol. She reports that she does not use drugs.  REVIEW OF SYSTEMS:    A 10 system review of systems was performed.  Significant positives include the fact that the patient requires assistance to transfer from bed to wheelchair and once in a wheelchair is able to move about propelling herself with her feet.  Fine motor movement is reportedly extremely poor.  Baseline mental status is such that the daughter is not sure that she would report dyspnea abdominal pain or dysuria if you know she had it.  She has had a prior CVA in 1996.  She also suffers from bipolar disorder for which she has been on Depakote for the past 40 years.  She is not known to have any history of known lung disease despite her neurological deficits she reportedly is never suffered from aspiration pneumonia in the past.  No known history of cardiovascular illness, she was told that she had CHF in the past however recent echocardiogram apparently was only remarkable for LVH.  She has had multiple urinary tract infections in the past but her daughter is not aware that she has any history of kidney stones or  structural anomaly of the urinary tract.  She has had diabetes controlled with metformin.  Greater than 32 minutes has been spent in the care of this hypotensive patient today  Lars Masson, MD

## 2017-11-14 NOTE — Progress Notes (Addendum)
Pharmacy Antibiotic Note  Carolyn Parrish is a 77 y.o. female admitted on 11/14/2017 with sepsis.  Pharmacy has been consulted for vancomycin and cefepime dosing. Pt with fever and tmax of 102.7. WBC is significantly elevated and Scr is also well above baseline. Lactic acid is >2. Pt has a mastectomy recently on 9/24.   Plan: Vancomycin 2gm IV x 1 then 1gm IV Q48H Cefepime 2gm IV x 1 then 1gm IV Q24H F/u renal fxn, C&S, clinical status and trough at SS  Addendum: Changing cefepime + flagyl to zosyn   Height: 5\' 10"  (177.8 cm) Weight: 205 lb 0.4 oz (93 kg) IBW/kg (Calculated) : 68.5  Temp (24hrs), Avg:102.7 F (39.3 C), Min:102.7 F (39.3 C), Max:102.7 F (39.3 C)  No results for input(s): WBC, CREATININE, LATICACIDVEN, VANCOTROUGH, VANCOPEAK, VANCORANDOM, GENTTROUGH, GENTPEAK, GENTRANDOM, TOBRATROUGH, TOBRAPEAK, TOBRARND, AMIKACINPEAK, AMIKACINTROU, AMIKACIN in the last 168 hours.  Estimated Creatinine Clearance: 55.1 mL/min (A) (by C-G formula based on SCr of 1.09 mg/dL (H)).    Allergies  Allergen Reactions  . Atorvastatin     UNSPECIFIED REACTION     Antimicrobials this admission: Vanc 10/9>> Cefepime 10/9>> Flagyl 10/9>>  Dose adjustments this admission: N/A  Microbiology results: Pending  Thank you for allowing pharmacy to be a part of this patient's care.  Brea Coleson, Rande Lawman 11/14/2017 8:10 AM

## 2017-11-14 NOTE — H&P (Signed)
Date: 11/14/2017               Patient Name:  Carolyn Parrish MRN: [id number]  DOB: Jul 07, 1940 Age / Sex: 77 y.o., female   PCP: Garwin Brothers, MD         Medical Service: Internal Medicine Teaching Service         Attending Physician: Dr. Annia Belt, MD    First Contact: Dr. Annie Paras Pager: [phone]  Second Contact: Dr. Heber [place] Pager: [phone]       After Hours (After 5p/  First Contact Pager: [phone]  weekends / holidays): Second Contact Pager: [phone]   Chief Complaint: Fever, tachycardia and hypotension.  History of Present Illness: Carolyn Parrish is a 77 y.o. female with PMHx significant for diabetes, she is affective disorder, hypertension, CVA and recent breast cancer s/p left mastectomy on 10/30/17 due to invasive ER PR positive ductal carcinoma, 2 drains are still in place with pink color clear serosanguineous discharge, was sent to ED from her [place] care with fever, tachycardia and hypotension.  Patient was found to have fever yesterday, they started her on Rocephin, this morning she remained febrile with a fever of 102, became tachycardic and hypotensive so they transferred her to [facility] emergency department.  Patient has  advanced cognitive decline, cannot communicate well, history was obtained from her daughter who is also her health power of attorney. She denies pain anywhere.  And could not even recall about recent surgery. But do cry when touched on her surgical site.  According to daughter they went to see surgeon on Monday, according to them her surgical wound was healing well but there like the drain in because of significant amount of drainage, plan was to reevaluate this coming Friday. Yesterday facility called her that her mother is having a fever of 104 and they gave her an IM antibiotic, per facility chart review it was Rocephin.  They called her again this morning that she continued to have high-grade fever and developed new nausea  and vomiting and hypotension requiring them to transfer her to ED for further management. Patient lived at [facility] since 1996 after having a stroke.  She had an history of labile mood whith shizoaffective disorder.  According to daughter her mother is prone to get UTIs, almost every 6 to 8 weeks, she never complained of any urinary symptoms, her only symptom is that she become more agitated.  She noticed some increased in her agitation since Monday. Per chart review her facility did BMP and CBC which shows leukocytosis and mild AKI,  UA was not done.  ED course.  In ED she was febrile to 102.7, tachycardic, tachypneic and hypotensive, with labs positive for peripheral predominant leukocytosis at 35, AKI with creatinine of 3.28 and UA showing few leukocytes, few bacteria, urine dipstick positive for hemoglobin and protein.  UA was obtained after she got 1 dose of Rocephin yesterday and 1 dose of each cefepime, vancomycin and Flagyl in the ED. Her blood pressure remained in 70s after 4L of NS boluses. PCCM was consulted for pressor support.   Meds:  Current Meds  Medication Sig  . acetaminophen (TYLENOL) 325 MG tablet Take 325-650 mg by mouth every 4 (four) hours as needed (650mg  for arthritis pain, 325mg  for fever >101).   Marland Kitchen alum & mag hydroxide-simeth (MYLANTA) 200-200-20 MG/5ML suspension Take 30 mLs by mouth every 4 (four) hours as needed for indigestion or heartburn.  Marland Kitchen aspirin 81 MG  chewable tablet Chew 81 mg by mouth daily.  . camphor-menthol (SARNA) lotion Apply 1 application topically 2 (two) times daily as needed for itching.  . cefTRIAXone (ROCEPHIN) 1 g injection Inject 1 g into the muscle once.  . divalproex (DEPAKOTE ER) 250 MG 24 hr tablet Take 250 mg by mouth daily.  Marland Kitchen docusate sodium (COLACE) 100 MG capsule Take 100 mg by mouth 2 (two) times daily.  Marland Kitchen doxycycline (VIBRAMYCIN) 100 MG capsule Take 100 mg by mouth 2 (two) times daily. x8 days.  Marland Kitchen lisinopril (PRINIVIL,ZESTRIL) 2.5 MG tablet  Take 2.5 mg by mouth daily.  . magnesium hydroxide (MILK OF MAGNESIA) 400 MG/5ML suspension Take 30 mLs by mouth daily as needed for mild constipation.  . metFORMIN (GLUCOPHAGE) 500 MG tablet Take 500 mg by mouth daily.  Marland Kitchen oxyCODONE (OXY IR/ROXICODONE) 5 MG immediate release tablet Take 1 tablet (5 mg total) by mouth every 4 (four) hours as needed for moderate pain.  . polyethylene glycol (MIRALAX / GLYCOLAX) packet Take 17 g by mouth daily.  . vitamin B-12 (CYANOCOBALAMIN) 500 MCG tablet Take 500 mcg by mouth daily.     Allergies: Allergies as of 11/14/2017 - Review Complete 11/14/2017  Allergen Reaction Noted  . Atorvastatin  09/12/2017   Past Medical History:  Diagnosis Date  . Cancer (HCC)    BREAST CANCER  . CHF (congestive heart failure) ([place])   . Diabetes mellitus without complication ([place])   . Heart failure ([place])   . Hyperlipidemia   . Hypertension   . Schizoaffective disorder ([place])   . Stroke [facility])     Family History: No significant family history.  Social History: Denies any smoking, alcohol or illicit drug use.  Review of Systems: A complete ROS was negative except as per HPI.   Physical Exam: Blood pressure (!) 71/38, pulse 96, temperature 99.3 F (37.4 C), temperature source Rectal, resp. rate (!) 21, height 5\' 10"  (1.778 m), weight 93 kg, SpO2 100 %. Vitals:   11/14/17 1245 11/14/17 1300 11/14/17 1315 11/14/17 1330  BP: (!) 95/35 (!) 76/36 (!) 71/34 (!) 82/39  Pulse:      Resp: 19 (!) 22 17 19   Temp:      TempSrc:      SpO2:      Weight:      Height:       General: Vital signs reviewed.  Patient is well-developed, alert, minimally verbal elderly lady, in no acute distress and cooperative with exam.  Head: Normocephalic and atraumatic. Eyes: EOMI, conjunctivae normal, no scleral icterus.  Neck: Supple, trachea midline, normal ROM, no JVD, masses, thyromegaly, or carotid bruit present.  Cardiovascular: RRR, S1 normal, S2 normal, no murmurs, gallops, or  rubs. Pulmonary/Chest: Clear to auscultation bilaterally, no wheezes, rales, or rhonchi.  Healing left-sided mastectomy scar with to drain in place, there was mild erythema surrounding the drain site and left axillary site of her scar with a mobile lymph node.  No wound dehiscence. Abdominal: Soft, non-tender, non-distended, BS +, no masses, organomegaly, or guarding present.  Extremities: Cold extremities, no lower extremity edema bilaterally,  pulses symmetric and intact bilaterally. No cyanosis or clubbing. Neurological: A&O x1, Strength is normal and symmetric bilaterally, cranial nerve II-XII are grossly intact, no focal motor deficit, sensory intact to light touch bilaterally.  Skin: Warm, dry and intact. No rashes or erythema.  EKG: personally reviewed my interpretation is sinus tachycardia with diffuse nonspecific T wave inversion.  No prior EKG to compare.  CXR: personally  reviewed my interpretation is without any infiltrate or acute abnormality.  Assessment & Plan by Problem:  Shikha D Servello is a 77 y.o. female with PMHx significant for diabetes, she is affective disorder, hypertension, CVA and recent breast cancer s/p left mastectomy on 10/30/17 due to invasive ER PR positive ductal carcinoma, 2 drains are still in place with pink color clear serosanguineous discharge, was sent to ED from her [place] care with fever, tachycardia and hypotension.  Sepsis.  With her persistent hypotension despite good fluid resuscitation she meets criteria for septic shock requiring pressor support. PCCM was consulted and they agreed to take her to ICU. She was started on broad-spectrum antibiotics due to unknown source. Most likely she is having another UTI.  UA does show few bacteria and leukocyte but no nitrites, it was obtained after antibiotics.  No previous UA in system. Recent surgical site does not appear grossly infected, surgery was consulted in ED. There was no other obvious skin  lesions. No cough or upper respiratory symptoms and chest x-ray was clear. -Patient will remain in ICU until she needs pressors. -We will resume care once stable.  AKI.  Certainly net 0.53 with BUN of 29, baseline 0.9.  Most likely secondary to sepsis and decreased perfusion. -Rehydrate and monitor.  Diabetes.  She has well-controlled diabetes with A1c of 6.3 done in September 2019 prior to her mastectomy. She is on Metformin at home. -Diabetes management per ICU protocol at this time.  Hypertension.  She was on a very low dose of lisinopril at 2.5 mg daily at home.  Currently hypotensive. We will hold her antihypertensive.  Shizoaffective disorder.  On Depakote for many years. -Continue home dose of Depakote.  CODE STATUS.  Full DVT prophylaxis.  Lovenox  Dispo: Admit patient to Inpatient with expected length of stay greater than 2 midnights.  SignedLorella Nimrod, MD 11/14/2017, 12:10 PM  Pager: [phone]

## 2017-11-14 NOTE — ED Notes (Signed)
Pfeiffer, MD aware of repeat lactic level and requests to have a redraw, informed of BP in the 80s sys, verbal order to admin a fourth liter of NS, will continue to monitor

## 2017-11-14 NOTE — ED Notes (Signed)
Informed consent completed for central line placement

## 2017-11-15 ENCOUNTER — Telehealth: Payer: Self-pay | Admitting: General Practice

## 2017-11-15 DIAGNOSIS — A419 Sepsis, unspecified organism: Principal | ICD-10-CM

## 2017-11-15 DIAGNOSIS — Z7984 Long term (current) use of oral hypoglycemic drugs: Secondary | ICD-10-CM

## 2017-11-15 DIAGNOSIS — N179 Acute kidney failure, unspecified: Secondary | ICD-10-CM

## 2017-11-15 DIAGNOSIS — E119 Type 2 diabetes mellitus without complications: Secondary | ICD-10-CM

## 2017-11-15 DIAGNOSIS — R6521 Severe sepsis with septic shock: Secondary | ICD-10-CM

## 2017-11-15 LAB — CBC WITH DIFFERENTIAL/PLATELET
ABS IMMATURE GRANULOCYTES: 0.15 10*3/uL — AB (ref 0.00–0.07)
BASOS PCT: 0 %
Basophils Absolute: 0 10*3/uL (ref 0.0–0.1)
Eosinophils Absolute: 0.2 10*3/uL (ref 0.0–0.5)
Eosinophils Relative: 1 %
HCT: 34.2 % — ABNORMAL LOW (ref 36.0–46.0)
Hemoglobin: 10.6 g/dL — ABNORMAL LOW (ref 12.0–15.0)
Immature Granulocytes: 1 %
Lymphocytes Relative: 5 %
Lymphs Abs: 1 10*3/uL (ref 0.7–4.0)
MCH: 30.5 pg (ref 26.0–34.0)
MCHC: 31 g/dL (ref 30.0–36.0)
MCV: 98.6 fL (ref 80.0–100.0)
MONO ABS: 1 10*3/uL (ref 0.1–1.0)
Monocytes Relative: 5 %
NEUTROS ABS: 18.7 10*3/uL — AB (ref 1.7–7.7)
Neutrophils Relative %: 88 %
PLATELETS: 237 10*3/uL (ref 150–400)
RBC: 3.47 MIL/uL — AB (ref 3.87–5.11)
RDW: 17 % — ABNORMAL HIGH (ref 11.5–15.5)
WBC: 21.1 10*3/uL — AB (ref 4.0–10.5)
nRBC: 0 % (ref 0.0–0.2)

## 2017-11-15 LAB — BASIC METABOLIC PANEL
ANION GAP: 5 (ref 5–15)
BUN: 17 mg/dL (ref 8–23)
CALCIUM: 6.8 mg/dL — AB (ref 8.9–10.3)
CO2: 21 mmol/L — ABNORMAL LOW (ref 22–32)
Chloride: 113 mmol/L — ABNORMAL HIGH (ref 98–111)
Creatinine, Ser: 1.14 mg/dL — ABNORMAL HIGH (ref 0.44–1.00)
GFR, EST AFRICAN AMERICAN: 53 mL/min — AB (ref >60)
GFR, EST NON AFRICAN AMERICAN: 46 mL/min — AB (ref >60)
Glucose, Bld: 156 mg/dL — ABNORMAL HIGH (ref 70–99)
POTASSIUM: 4.1 mmol/L (ref 3.5–5.1)
SODIUM: 139 mmol/L (ref 135–145)

## 2017-11-15 LAB — GLUCOSE, CAPILLARY
GLUCOSE-CAPILLARY: 87 mg/dL (ref 70–99)
Glucose-Capillary: 140 mg/dL — ABNORMAL HIGH (ref 70–99)
Glucose-Capillary: 144 mg/dL — ABNORMAL HIGH (ref 70–99)

## 2017-11-15 LAB — URINE CULTURE: CULTURE: NO GROWTH

## 2017-11-15 LAB — PROCALCITONIN: PROCALCITONIN: 0.78 ng/mL

## 2017-11-15 MED ORDER — ENOXAPARIN SODIUM 40 MG/0.4ML ~~LOC~~ SOLN
40.0000 mg | SUBCUTANEOUS | Status: DC
  Administered 2017-11-15 – 2017-11-22 (×8): 40 mg via SUBCUTANEOUS
  Filled 2017-11-15 (×8): qty 0.4

## 2017-11-15 MED ORDER — INSULIN ASPART 100 UNIT/ML ~~LOC~~ SOLN
0.0000 [IU] | Freq: Three times a day (TID) | SUBCUTANEOUS | Status: DC
  Administered 2017-11-15 – 2017-11-16 (×2): 1 [IU] via SUBCUTANEOUS
  Administered 2017-11-18 – 2017-11-20 (×2): 2 [IU] via SUBCUTANEOUS
  Administered 2017-11-21 – 2017-11-22 (×3): 1 [IU] via SUBCUTANEOUS

## 2017-11-15 MED ORDER — VANCOMYCIN HCL IN DEXTROSE 750-5 MG/150ML-% IV SOLN
750.0000 mg | Freq: Two times a day (BID) | INTRAVENOUS | Status: DC
  Administered 2017-11-15 – 2017-11-16 (×3): 750 mg via INTRAVENOUS
  Filled 2017-11-15 (×4): qty 150

## 2017-11-15 MED ORDER — SODIUM CHLORIDE 0.9 % IV SOLN
2.0000 g | Freq: Once | INTRAVENOUS | Status: AC
  Administered 2017-11-15: 2 g via INTRAVENOUS
  Filled 2017-11-15: qty 20

## 2017-11-15 MED ORDER — PIPERACILLIN-TAZOBACTAM 3.375 G IVPB
3.3750 g | Freq: Three times a day (TID) | INTRAVENOUS | Status: DC
  Administered 2017-11-15 – 2017-11-16 (×4): 3.375 g via INTRAVENOUS
  Filled 2017-11-15 (×5): qty 50

## 2017-11-15 MED ORDER — FAMOTIDINE 20 MG PO TABS
20.0000 mg | ORAL_TABLET | Freq: Two times a day (BID) | ORAL | Status: DC
  Administered 2017-11-15 – 2017-11-23 (×17): 20 mg via ORAL
  Filled 2017-11-15 (×17): qty 1

## 2017-11-15 MED ORDER — HYDROCORTISONE NA SUCCINATE PF 100 MG IJ SOLR
50.0000 mg | Freq: Four times a day (QID) | INTRAMUSCULAR | Status: DC
  Administered 2017-11-15 – 2017-11-16 (×6): 50 mg via INTRAVENOUS
  Filled 2017-11-15 (×5): qty 2

## 2017-11-15 NOTE — Progress Notes (Signed)
Pharmacy Antibiotic Note  Carolyn Parrish is a 77 y.o. female admitted on 11/14/2017 with sepsis.  Pharmacy has been consulted for vancomycin and cefepime dosing. Of note patient had a mastectomy recently on 9/24. On admission her tmax was 102.7, but she is afebrile today. WBC is still elevated, but is trending down. Lactic acid has also trended down today from is >2 to 1.68. Patient's Scr has improved from 3.28 on admission to 1.14 today (nCrCl ~48 mL/min).  Plan: Increase vancomycin to 750 mg IV Q12H Increase zosyn to 3.375 g IV Q8H F/u renal fxn, C&S, LOT,  clinical status and vancomycin trough at SS  Height: 5\' 10"  (177.8 cm) Weight: 205 lb 0.4 oz (93 kg) IBW/kg (Calculated) : 68.5  Temp (24hrs), Avg:98.3 F (36.8 C), Min:97.5 F (36.4 C), Max:99.3 F (37.4 C)  Recent Labs  Lab 11/14/17 0804 11/14/17 0839 11/14/17 1012 11/14/17 1043 11/15/17 0351  WBC 35.0*  --   --   --  21.1*  CREATININE 3.28*  --   --   --  1.14*  LATICACIDVEN  --  2.94* 9.03* 1.68  --     Estimated Creatinine Clearance: 52.7 mL/min (A) (by C-G formula based on SCr of 1.14 mg/dL (H)).    Allergies  Allergen Reactions  . Atorvastatin     UNSPECIFIED REACTION     Antimicrobials this admission: Vanc 10/9>> Zosyn 10/9 >> Cefepime 10/9 x 1 Flagyl 10/9 x 1  Dose adjustments this admission: 10/10: Vancomycin 1000 mg IV Q48H > 750 mg IV Q12H  10/10: Zosyn 2.25 g IV Q6H > 3.375 g IV Q8H  Microbiology results: 10/9 MRSA PCR negative 10/9 Bcx: no growth to date 10/9: Ucx: no growth (final)  Thank you for allowing pharmacy to be a part of this patient's care.  Leron Croak, PharmD PGY1 Pharmacy Resident Phone: [phone]  Please check AMION for all [place] phone numbers 11/15/2017 11:05 AM

## 2017-11-15 NOTE — Telephone Encounter (Signed)
[place] CSW Progress Note  Call from daughter, Kennith Gain.  Wants help transitioning patient from current SNF placement to another SNF.  CSW Wynetta Emery advised and asked to call daughter at [phone].  Edwyna Shell, LCSW Clinical Social Worker Phone:  [phone]

## 2017-11-15 NOTE — Progress Notes (Signed)
PULMONARY / CRITICAL CARE MEDICINE   NAME:  Carolyn Parrish, MRN:  [id number], DOB:  12-Jun-1940, LOS: 1 ADMISSION DATE:  11/14/2017, CONSULTATION DATE:  10/9 REFERRING MD:  , CHIEF COMPLAINT: Fever and hypotension  BRIEF HISTORY:    77 year old with difficulties in communicating at baseline who was found to be febrile at her nursing home last night.  She was given a dose of IM Rocephin and transferred to our emergency room with fever tachycardia and hypotension this morning.  Hypotension has persisted despite 4 L of crystalloid  HISTORY OF PRESENT ILLNESS       History of obtained from the daughter who tells me that she is not sure that her mother would report abdominal pain cough dyspnea or dysuria even if it was present.  She is not aware that her mother has any skin breakdown.  All she knows of the current history is that she had a temperature of 104 degrees last night and was given 1 dose of intramuscular antibiotics.  The patient has had multiple episodes of UTIs in the past.  She is status post mastectomy on 9/24 and still has drains in place.  She denies any discomfort at the surgical site and in fact is not aware that she had any surgery.  SIGNIFICANT PAST MEDICAL HISTORY   Bipolar disorder, CVA, hypertension, type 2 diabetes Past surgical history remarkable for cholecystectomy and mastectomy  SIGNIFICANT EVENTS:   STUDIES:   Chest x-ray to my eye shows no infiltrate  CULTURES:  Blood 10/9 >> Urine 10/9 >>   ANTIBIOTICS:  Ceftriaxone IM x1 on 10/8 Cefepime 10/9 >> 10/10 Flagyl 10/9 >> 10/10 Vancomycin 10/9 >>  Zosyn 10/10 >>  LINES/TUBES:  Left IJ CVC 10/9 >>  Left chest JP drains since surgery 9/24 >>   CONSULTANTS:  General surgery  SUBJECTIVE / interval events:  Norepinephrine has been weaned from 20 to 7 since admission More awake with a more appropriate affect today  CONSTITUTIONAL: BP (!) 106/56   Pulse 74   Temp (!) 97.5 F (36.4 C) (Oral)   Resp 13    Ht '5\' 10"'$  (1.778 m)   Wt 93 kg   SpO2 99%   BMI 29.42 kg/m   I/O last 3 completed shifts: In: 7368.1 [P.O.:240; I.V.:6831.3; IV Piggyback:296.7] Out: 2285 [Urine:1650; Drains:635]        PHYSICAL EXAM: General: Elderly woman, eating breakfast, no distress Neuro: Awake, interacts, answers questions appropriately tended to self and place moves her extremities with good strength HEENT: No stridor, no elevated CVP, oropharynx clear Cardiovascular: Regular, no murmur, no significant lower extremity edema Lungs: Clear bilaterally.   Chest: Her JP drains have serous fluid, no significant sediment or evidence of purulence Abdomen: Soft, nontender, positive bowel sounds Musculoskeletal: No deformity Skin: No apparent rash  RESOLVED PROBLEM LIST    ASSESSMENT AND PLAN    Shock, presumed septic shock.  Potential sources include urinary tract infection, consider also wound post left simple mastectomy although no local evidence for infection. -Appreciate general surgery evaluation, there is suspicion for wound infection is low.  Drains in place -Volume resuscitation -Wean norepinephrine as able, goal MAP > 65 -Lactic acid clearing, 9.03 >> 1.68 -Continue current broad antibiotics with vancomycin, Zosyn -Follow culture data -Consider culturing JP drain fluid although does not have the appearance of infection  Leukocytosis -Follow CBC  Acute renal failure, improved -Follow BMP, urine output with volume resuscitation  Lactic acidosis, improved -Follow BMP, lactate for clearance  Acute toxic  metabolic encephalopathy  Relative adrenal insufficiency based on random cortisol 16.4 during shock state -Add empiric hydrocortisone, follow mental status as this would potentially put her at risk for delirium  Type 2 diabetes -Home metformin on hold -Initiate sliding scale insulin per protocol  History hypertension -Home lisinopril on hold  Bipolar disorder -On home Depakote  Stage  II left breast cancer, Grade 2 ductal cell (ER, PR positive, HER-2 negative) Left mastectomy 10/30/2017  -Drain management as per surgery recommendations   SUMMARY OF TODAY'S PLAN:    Best Practice / Goals of Care / Disposition.     LABS  Glucose Recent Labs  Lab 11/14/17 1536 11/15/17 0803  GLUCAP 101* 87    BMET Recent Labs  Lab 11/14/17 0804 11/15/17 0351  NA 136 139  K 4.7 4.1  CL 101 113*  CO2 18* 21*  BUN 29* 17  CREATININE 3.28* 1.14*  GLUCOSE 127* 156*    Liver Enzymes Recent Labs  Lab 11/14/17 0804  AST 31  ALT 29  ALKPHOS 75  BILITOT 0.8  ALBUMIN 2.8*    Electrolytes Recent Labs  Lab 11/14/17 0804 11/15/17 0351  CALCIUM 8.3* 6.8*    CBC Recent Labs  Lab 11/14/17 0804 11/15/17 0351  WBC 35.0* 21.1*  HGB 13.1 10.6*  HCT 44.6 34.2*  PLT 372 237    ABG No results for input(s): PHART, PCO2ART, PO2ART in the last 168 hours.  Coag's No results for input(s): APTT, INR in the last 168 hours.  Sepsis Markers Recent Labs  Lab 11/14/17 0839 11/14/17 1012 11/14/17 1043 11/14/17 1459 11/15/17 0351  LATICACIDVEN 2.94* 9.03* 1.68  --   --   PROCALCITON  --   --   --  1.76 0.78    Cardiac Enzymes No results for input(s): TROPONINI, PROBNP in the last 168 hours.   Independent CC time 33 minutes  Baltazar Apo, MD, PhD 11/15/2017, 10:38 AM [facility] [phone] or if no answer [phone]

## 2017-11-16 LAB — CBC
HEMATOCRIT: 35.2 % — AB (ref 36.0–46.0)
HEMOGLOBIN: 10.4 g/dL — AB (ref 12.0–15.0)
MCH: 29.2 pg (ref 26.0–34.0)
MCHC: 29.5 g/dL — ABNORMAL LOW (ref 30.0–36.0)
MCV: 98.9 fL (ref 80.0–100.0)
NRBC: 0 % (ref 0.0–0.2)
Platelets: 277 10*3/uL (ref 150–400)
RBC: 3.56 MIL/uL — AB (ref 3.87–5.11)
RDW: 16.9 % — ABNORMAL HIGH (ref 11.5–15.5)
WBC: 11.1 10*3/uL — ABNORMAL HIGH (ref 4.0–10.5)

## 2017-11-16 LAB — BASIC METABOLIC PANEL
ANION GAP: 5 (ref 5–15)
BUN: 7 mg/dL — ABNORMAL LOW (ref 8–23)
CHLORIDE: 113 mmol/L — AB (ref 98–111)
CO2: 24 mmol/L (ref 22–32)
Calcium: 7.5 mg/dL — ABNORMAL LOW (ref 8.9–10.3)
Creatinine, Ser: 0.7 mg/dL (ref 0.44–1.00)
GFR calc Af Amer: >60 mL/min (ref >60)
GFR calc non Af Amer: >60 mL/min (ref >60)
Glucose, Bld: 147 mg/dL — ABNORMAL HIGH (ref 70–99)
POTASSIUM: 3.6 mmol/L (ref 3.5–5.1)
Sodium: 142 mmol/L (ref 135–145)

## 2017-11-16 LAB — PHOSPHORUS: Phosphorus: 1.1 mg/dL — ABNORMAL LOW (ref 2.5–4.6)

## 2017-11-16 LAB — GLUCOSE, CAPILLARY
GLUCOSE-CAPILLARY: 156 mg/dL — AB (ref 70–99)
GLUCOSE-CAPILLARY: 111 mg/dL — AB (ref 70–99)
GLUCOSE-CAPILLARY: 158 mg/dL — AB (ref 70–99)
Glucose-Capillary: 137 mg/dL — ABNORMAL HIGH (ref 70–99)
Glucose-Capillary: 186 mg/dL — ABNORMAL HIGH (ref 70–99)

## 2017-11-16 LAB — PROTIME-INR
INR: 1.17
PROTHROMBIN TIME: 14.8 s (ref 11.4–15.2)

## 2017-11-16 LAB — LACTIC ACID, PLASMA: LACTIC ACID, VENOUS: 1.3 mmol/L (ref 0.5–1.9)

## 2017-11-16 LAB — PROCALCITONIN: Procalcitonin: 0.44 ng/mL

## 2017-11-16 LAB — MAGNESIUM: Magnesium: 1.7 mg/dL (ref 1.7–2.4)

## 2017-11-16 MED ORDER — SODIUM CHLORIDE 0.9 % IV SOLN
1.0000 g | INTRAVENOUS | Status: DC
  Administered 2017-11-16 – 2017-11-18 (×3): 1 g via INTRAVENOUS
  Filled 2017-11-16 (×3): qty 10

## 2017-11-16 MED ORDER — HYDROCORTISONE NA SUCCINATE PF 100 MG IJ SOLR
50.0000 mg | Freq: Two times a day (BID) | INTRAMUSCULAR | Status: DC
  Administered 2017-11-17 (×2): 50 mg via INTRAVENOUS
  Filled 2017-11-16 (×2): qty 2

## 2017-11-16 MED ORDER — POTASSIUM PHOSPHATES 15 MMOLE/5ML IV SOLN
30.0000 mmol | Freq: Once | INTRAVENOUS | Status: AC
  Administered 2017-11-16: 30 mmol via INTRAVENOUS
  Filled 2017-11-16: qty 10

## 2017-11-16 MED ORDER — MAGNESIUM SULFATE 2 GM/50ML IV SOLN
2.0000 g | Freq: Once | INTRAVENOUS | Status: AC
  Administered 2017-11-16: 2 g via INTRAVENOUS
  Filled 2017-11-16: qty 50

## 2017-11-16 NOTE — Progress Notes (Signed)
PULMONARY / CRITICAL CARE MEDICINE   NAME:  Carolyn Parrish, MRN:  [id number], DOB:  05-Sep-1940, LOS: 2 ADMISSION DATE:  11/14/2017, CONSULTATION DATE:  10/9 REFERRING MD:  , CHIEF COMPLAINT: Fever and hypotension  BRIEF HISTORY:    77 year old with difficulties in communicating at baseline who was found to be febrile at her nursing home last night.  She was given a dose of IM Rocephin and transferred to our emergency room with fever tachycardia and hypotension this morning.  Hypotension has persisted despite 4 L of crystalloid requiring pressors.  Septic shock from UTI vs less likely 9/24 mastectomy wound.   SIGNIFICANT PAST MEDICAL HISTORY   Bipolar disorder, CVA, hypertension, type 2 diabetes, HF, cancer, HLD, frequent UTIs Past surgical history remarkable for cholecystectomy and mastectomy  SIGNIFICANT EVENTS:  10/9 Admitted   STUDIES:    CULTURES:  11/14/17 MRSA PCR >> neg 11/14/17 BC x 2 >>  10/9 UC >> negative  ANTIBIOTICS:  PTA IM ceftriaxone  10/9 cefepime >> ER 10/9 flagyl  >> ER 10/9 vanc >> 10/11 10/10 zosyn >> 10/11 10/11 ceftriaxone >>  LINES/TUBES:  PTA-  2 left JP drains at mastectomy wound  10/9 R IJ CVC >>  CONSULTANTS:  General surgery  SUBJECTIVE:  Levophed off at 0915 Patient eating, without complaints, denies pain   CONSTITUTIONAL: BP (!) 111/53   Pulse 62   Temp (!) 97.4 F (36.3 C) (Oral)   Resp 14   Ht 5\' 10"  (1.778 m)   Wt 93 kg   SpO2 100%   BMI 29.42 kg/m   I/O last 3 completed shifts: In: 9792.5 [P.O.:240; I.V.:8882.2; IV Piggyback:670.3] Out: 7619 [Urine:3800; Drains:195]  PHYSICAL EXAM: General:  Frail elderly female sitting upright in bed in NAD HEENT: MM pink/moist, pupils 3/reactive, speech is slowed and hard to understand at times Neuro: Awake, states her DOB, name, place, f/c in all extremities, generalized weak with slight tremor with movement CV: RRR, no murmur PULM: even/non-labored, lungs bilaterally clear, slight  diminished in bases GI: obese, soft, +BS Extremities: warm/dry, mild edema to LUE, no BLE edema Skin: no rashes, left mastectomy- dressing cdi, 2 JP with mostly serous drainage  RESOLVED PROBLEM LIST   ASSESSMENT AND PLAN   Septic shock - resolving  - suspected urinary etiology given that she was treated with abx prior to admit, although UC neg.  CXR neg.  Improving PCT 1.76 ->0.78 ->0.44 P:  Continue to wean levophed off for MAP > 65- currently off Continue stress dose steroids for now D/c IVF given increasing risk for further hyperchloremic acidosis and patient is eating Trend UOP Will hold off sending fluid from JP drain for culture given she is 3 days in to abx therapy and no obvious purulent drainage Follow BCs Given overall clinical improvement, will deescalate from zosyn to ceftriaxone and monitor clinically No indication to continue vanc, will d/c  AKI- resolving P:  Trending renal panel and UOP ([place])  DMT2 P:  SSI  Holding metformin  Breast CA s/p R mastectomy 9/24 with JP drains x 2 P:  Surgery following  [place] disorder P:  Continue depakote  Hx HTN Holding lisinopril Continue ASA   SUMMARY OF TODAY'S PLAN:  D/c vasopressors this am If remains stable, tx out of ICU this afternoon  Best Practice / Goals of Care / Disposition.      Diet: heart healthy/ carb modi Pain/Anxiety/Delirium protocol (if indicated): n/a VAP protocol (if indicated): n/a DVT prophylaxis: Lovenox  GI prophylaxis: pepcid  Hyperglycemia protocol: SSI sensitive, may need to adjust now shes eating if consistently > 180 Mobility: PT ordered Code Status: full  Family Communication: no family at bedside.   LABS  Glucose Recent Labs  Lab 11/14/17 1536 11/15/17 0803 11/15/17 1143 11/15/17 1613 11/16/17 0013 11/16/17 0837  GLUCAP 101* 87 140* 144* 186* 137*    BMET Recent Labs  Lab 11/14/17 0804 11/15/17 0351 11/16/17 0500  NA 136 139 142  K 4.7 4.1 3.6  CL 101  113* 113*  CO2 18* 21* 24  BUN 29* 17 7*  CREATININE 3.28* 1.14* 0.70  GLUCOSE 127* 156* 147*    Liver Enzymes Recent Labs  Lab 11/14/17 0804  AST 31  ALT 29  ALKPHOS 75  BILITOT 0.8  ALBUMIN 2.8*    Electrolytes Recent Labs  Lab 11/14/17 0804 11/15/17 0351 11/16/17 0500  CALCIUM 8.3* 6.8* 7.5*  MG  --   --  1.7    CBC Recent Labs  Lab 11/14/17 0804 11/15/17 0351  WBC 35.0* 21.1*  HGB 13.1 10.6*  HCT 44.6 34.2*  PLT 372 237    ABG No results for input(s): PHART, PCO2ART, PO2ART in the last 168 hours.  Coag's Recent Labs  Lab 11/16/17 0500  INR 1.17    Sepsis Markers Recent Labs  Lab 11/14/17 1012 11/14/17 1043 11/14/17 1459 11/15/17 0351 11/16/17 0500  LATICACIDVEN 9.03* 1.68  --   --  1.3  PROCALCITON  --   --  1.76 0.78 0.44    Cardiac Enzymes No results for input(s): TROPONINI, PROBNP in the last 168 hours.  CCT Bieber, AGACNP-BC [facility] Pgr: [phone] or if no answer [phone] 11/16/2017, 9:56 AM

## 2017-11-16 NOTE — Progress Notes (Signed)
Surgical site intact Keep drain will remove as outpatient

## 2017-11-17 DIAGNOSIS — F259 Schizoaffective disorder, unspecified: Secondary | ICD-10-CM

## 2017-11-17 DIAGNOSIS — I69398 Other sequelae of cerebral infarction: Secondary | ICD-10-CM

## 2017-11-17 LAB — GLUCOSE, CAPILLARY
GLUCOSE-CAPILLARY: 111 mg/dL — AB (ref 70–99)
GLUCOSE-CAPILLARY: 128 mg/dL — AB (ref 70–99)
Glucose-Capillary: 106 mg/dL — ABNORMAL HIGH (ref 70–99)
Glucose-Capillary: 106 mg/dL — ABNORMAL HIGH (ref 70–99)

## 2017-11-17 LAB — RENAL FUNCTION PANEL
ANION GAP: 6 (ref 5–15)
Albumin: 1.9 g/dL — ABNORMAL LOW (ref 3.5–5.0)
BUN: 9 mg/dL (ref 8–23)
CHLORIDE: 110 mmol/L (ref 98–111)
CO2: 24 mmol/L (ref 22–32)
CREATININE: 0.76 mg/dL (ref 0.44–1.00)
Calcium: 7.9 mg/dL — ABNORMAL LOW (ref 8.9–10.3)
Glucose, Bld: 134 mg/dL — ABNORMAL HIGH (ref 70–99)
POTASSIUM: 3.9 mmol/L (ref 3.5–5.1)
Phosphorus: 2.2 mg/dL — ABNORMAL LOW (ref 2.5–4.6)
Sodium: 140 mmol/L (ref 135–145)

## 2017-11-17 LAB — MAGNESIUM: Magnesium: 2 mg/dL (ref 1.7–2.4)

## 2017-11-17 NOTE — Progress Notes (Addendum)
   Subjective: No overnight events. Ms. Mennella reports that she is sleeping and eating well. She denies any pain.   Objective:  Vital signs in last 24 hours: Vitals:   11/16/17 2000 11/16/17 2002 11/16/17 2042 11/17/17 0552  BP: (!) 96/53  (!) 101/44 (!) 103/53  Pulse: 64  65 66  Resp: 14  18 17   Temp:  97.8 F (36.6 C) 98.2 F (36.8 C) 97.7 F (36.5 C)  TempSrc:  Oral Oral Oral  SpO2: 98%  100% 98%  Weight:      Height:       Physical Exam Constitutional: Laying comfortably in bed. Cardiovascular: Normal rate and regular rhythm. No murmurs, rubs, or gallops. Pulmonary/Chest: Effort normal. Clear to auscultation bilaterally. No wheezes, rales, or rhonchi. Left chest JP drains with cloudy yellow fluid.  Abdominal: Bowel sounds present. Soft, non-distended, non-tender. Ext: 1+ pitting BLE edema. Skin: Warm and dry. No rashes or wounds.  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 She was admitted on 10/9 for suspected septic shock with fever to 102.7, leukocytosis to 35, lactate of 2.94, and hypotension despite 4L IVF resuscitation. She required ICU care for pressor support for hypotension. Her blood and urine cultures have been negative although the cultures were drawn after she received a dose of ceftriaxone at her SNF. Source of infection is thought to be UTI or breast surgical site infection. UTI is more likely as she has a history of frequent UTIs and there is no sign of surgical site infection on physical exam. She was weaned off pressors 10/11 and transferred to the internal medicine service from the ICU on 10/12. Her antibiotic regimen was narrowed from Vanc and Zosyn to Ceftriaxone on 10/11. She is gradually weaning off stress dose steroids. Her leukocytosis and lactic acidosis have resolved.  Sepsis - Most  likely 2/2 UTI. Blood cx and urine cx have been negative, but were drawn after abx administration. - Currently on day 4 of abx with ceftriaxone. Plan to do 5 days of IV before transitioning to PO. - Bps have been in the 78G-956O systolic off of pressors - She is eating and drinking well, so IVF have been stopped - Weaning down stress dose steroids. Cortisol level was 16.4 on day of admission. Possibly relative adrenal insufficiency in the setting of sepsis. She has no history of adrenal insufficiency.  - She has lower extremity pitting edema on exam, which is most likely a result of the IV fluids and steroids. Will continue to monitor. Plan - Continue ceftriaxone (day 4 of abx) - Hydrocortisone 50mg  BID today. Wean to 50mg  daily tomorrow. Plan to stop steroids on 10/14.  Breast cancer s/p R mastectomy 9/24 - JP drains x2 with cloudy yellow  - Surgery following  AKI - AKI has resolved from a Cr of 3.28 and BUN of 29 on admission to Cr of 0.76 and BUN of 9.  Diabetes - A1c 6.3 9/19. Home regimen consists of metformin. Plan - SSI  Hypertension - Continue to hold home lisinopril for soft blood pressures   Shizoaffective disorder - Continue home dose of Depakote  Dispo: Anticipated discharge in approximately 3 days.  Ellianne Gowen, Andree Elk, MD 11/17/2017, 7:07 AM Pager: [phone]

## 2017-11-17 NOTE — Progress Notes (Signed)
Internal Medicine Attending:   I saw and examined the patient. I reviewed the resident's note and I agree with the resident's findings and plan as documented in the resident's note.  As noted this is a 77 yo female originally admitted for Septic shock to the ICU.  She has a history of bipolar disorder, hypertension, diabetes and recent left mastectomy for breast cancer.   Suspected source was urine, urine culture has no growth however apparently she had received some antibiotics prior to admission.  Tenderness trending down.  Blood pressure is improving and she is off service.  There was concern for relative adrenal insufficiency her cortisol was 16.  We are currently weaning off stress dose steroids. On my exam she was oriented to person and place but not time, she appeared to have some mild cognitive slowing but answers were appropriate.  Heart was regular rate and rhythm lungs clear, abdomen soft nontender, she does have 1+ pedal edema bilaterally.  She has JP drains in place from recent surgery.  Assessment and plan Septic shock secondary to suspected urinary source -De-escalated now to ceftriaxone overall appears to be doing well -Continue to follow blood pressures wean off steroids. -Monitor volume status  AKI -Improving

## 2017-11-17 NOTE — Evaluation (Signed)
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 She was admitted on 10/9 for suspected septic.  Source of infection is suspected UTI.    Clinical Impression  Patient presents with significant deficits in mobility due to previous stroke and generalized weakness, hemiplegia and decreased balance.  Feel patient would benefit from continued PT to improve mobility and decrease caregiver burden.  Feel patient could make strides in mobility.  Recommend PT follow while in hospital and continue in SNF at discharge.      Follow Up Recommendations SNF    Equipment Recommendations  None recommended by PT    Recommendations for Other Services       Precautions / Restrictions Precautions Precautions: Fall      Mobility  Bed Mobility Overal bed mobility: Needs Assistance Bed Mobility: Rolling;Sidelying to Sit;Sit to Sidelying Rolling: Mod assist Sidelying to sit: Mod assist     Sit to sidelying: Min assist General bed mobility comments: able to initiate roll to right, required assistance to fully rotate to side; sit to supine - assistance to lift legs to bed and lower shoulders to bed in controlled manner  Transfers                    Ambulation/Gait                Stairs            Wheelchair Mobility    Modified Rankin (Stroke Patients Only) Modified Rankin (Stroke Patients Only) Pre-Morbid Rankin Score: Severe disability Modified Rankin: Severe disability     Balance Overall balance assessment: Needs assistance Sitting-balance support: No upper extremity supported;Feet supported Sitting balance-Leahy Scale: Poor Sitting balance - Comments: patient requires varying  assist for EOB - min assist to supervision; verbal cues to maintain upright and midline.                                     Pertinent Vitals/Pain Pain Assessment: No/denies pain    Home Living Family/patient expects to be discharged to:: Skilled nursing facility                      Prior Function Level of Independence: Needs assistance   Gait / Transfers Assistance Needed: patient is poor historian; per patient she required +2 assistance bed to wheelchair; non-ambulatory           Hand Dominance        Extremity/Trunk Assessment   Upper Extremity Assessment Upper Extremity Assessment: Defer to OT evaluation    Lower Extremity Assessment Lower Extremity Assessment: RLE deficits/detail;LLE deficits/detail RLE Deficits / Details: ankle dorsiflexion - not full ROM; able to lift foot against gravity; knee extension, 2/5; hip flexion 2/5 LLE Deficits / Details: ankle dorsiflexion, knee extension and hip flexion at least 3/5     Cervical / Trunk Assessment Cervical / Trunk Assessment: Kyphotic  Communication   Communication: Expressive difficulties  Cognition Arousal/Alertness: Awake/alert Behavior During Therapy: Agitated;Flat affect Overall Cognitive Status: No family/caregiver present to determine baseline cognitive functioning  General Comments: able to follow basic commands; oriented to location and month      General Comments      Exercises General Exercises - Lower Extremity Ankle Circles/Pumps: AROM;Both;5 reps;Seated Long Arc Quad: AROM;Both;5 reps;Seated Hip Flexion/Marching: AROM;5 reps;Both;Seated   Assessment/Plan    PT Assessment Patient needs continued PT services  PT Problem List Decreased strength;Decreased range of motion;Decreased activity tolerance;Decreased balance;Decreased cognition;Decreased mobility;Obesity       PT Treatment Interventions Functional mobility  training;Neuromuscular re-education;Balance training;Therapeutic exercise;Therapeutic activities;Patient/family education    PT Goals (Current goals can be found in the Care Plan section)  Acute Rehab PT Goals Patient Stated Goal: none stated PT Goal Formulation: With patient Time For Goal Achievement: 12/01/17 Potential to Achieve Goals: Fair    Frequency Min 2X/week   Barriers to discharge        Co-evaluation               AM-PAC PT "6 Clicks" Daily Activity  Outcome Measure Difficulty turning over in bed (including adjusting bedclothes, sheets and blankets)?: A Lot Difficulty moving from lying on back to sitting on the side of the bed? : Unable Difficulty sitting down on and standing up from a chair with arms (e.g., wheelchair, bedside commode, etc,.)?: Unable Help needed moving to and from a bed to chair (including a wheelchair)?: Total Help needed walking in hospital room?: Total Help needed climbing 3-5 steps with a railing? : Total 6 Click Score: 7    End of Session   Activity Tolerance: Patient tolerated treatment well;Patient limited by fatigue Patient left: in bed;with call bell/phone within reach;with nursing/sitter in room Nurse Communication: Mobility status PT Visit Diagnosis: Other abnormalities of gait and mobility (R26.89);Muscle weakness (generalized) (M62.81);Hemiplegia and hemiparesis Hemiplegia - Right/Left: Right Hemiplegia - caused by: Other cerebrovascular disease    Time: 0383-3383 PT Time Calculation (min) (ACUTE ONLY): 32 min   Charges:   PT Evaluation $PT Eval Moderate Complexity: 1 Mod PT Treatments $Therapeutic Activity: 8-22 mins        11/17/2017 Kendrick [place], PT Acute Rehabilitation Services Pager:  [phone] Office:  [phone]    Shanna Cisco 11/17/2017, 3:32 PM

## 2017-11-18 LAB — CBC
HCT: 33.8 % — ABNORMAL LOW (ref 36.0–46.0)
Hemoglobin: 10.3 g/dL — ABNORMAL LOW (ref 12.0–15.0)
MCH: 29.4 pg (ref 26.0–34.0)
MCHC: 30.5 g/dL (ref 30.0–36.0)
MCV: 96.6 fL (ref 80.0–100.0)
PLATELETS: 252 10*3/uL (ref 150–400)
RBC: 3.5 MIL/uL — ABNORMAL LOW (ref 3.87–5.11)
RDW: 16.9 % — AB (ref 11.5–15.5)
WBC: 10.5 10*3/uL (ref 4.0–10.5)
nRBC: 0.2 % (ref 0.0–0.2)

## 2017-11-18 LAB — BASIC METABOLIC PANEL
Anion gap: 8 (ref 5–15)
BUN: 14 mg/dL (ref 8–23)
CO2: 22 mmol/L (ref 22–32)
Calcium: 7.8 mg/dL — ABNORMAL LOW (ref 8.9–10.3)
Chloride: 110 mmol/L (ref 98–111)
Creatinine, Ser: 0.92 mg/dL (ref 0.44–1.00)
GFR calc Af Amer: >60 mL/min (ref >60)
GFR calc non Af Amer: 59 mL/min — ABNORMAL LOW (ref >60)
GLUCOSE: 123 mg/dL — AB (ref 70–99)
Potassium: 4.2 mmol/L (ref 3.5–5.1)
SODIUM: 140 mmol/L (ref 135–145)

## 2017-11-18 LAB — GLUCOSE, CAPILLARY
GLUCOSE-CAPILLARY: 133 mg/dL — AB (ref 70–99)
Glucose-Capillary: 101 mg/dL — ABNORMAL HIGH (ref 70–99)
Glucose-Capillary: 107 mg/dL — ABNORMAL HIGH (ref 70–99)
Glucose-Capillary: 189 mg/dL — ABNORMAL HIGH (ref 70–99)

## 2017-11-18 MED ORDER — HYDROCORTISONE NA SUCCINATE PF 100 MG IJ SOLR
50.0000 mg | Freq: Every day | INTRAMUSCULAR | Status: AC
  Administered 2017-11-18: 50 mg via INTRAVENOUS
  Filled 2017-11-18: qty 2

## 2017-11-18 NOTE — Progress Notes (Signed)
   Subjective: No overnight events. Carolyn Parrish is sitting up and eating breakfast this morning. She denies any pain. She has no new symptoms or concerns.   Objective:  Vital signs in last 24 hours: Vitals:   11/17/17 0552 11/17/17 1519 11/17/17 2032 11/18/17 0550  BP: (!) 103/53 128/60 (!) 118/57 109/60  Pulse: 66 71 72 65  Resp: 17 18 18 18   Temp: 97.7 F (36.5 C) 98.1 F (36.7 C) 97.8 F (36.6 C) 98.1 F (36.7 C)  TempSrc: Oral     SpO2: 98% 100% 100% 98%  Weight:      Height:       Physical Exam Constitutional: Sitting up comfortably in bed. Cardiovascular:Normal rateand regular rhythm. No murmurs, rubs, or gallops. Pulmonary/Chest:Effort normal. Clear to auscultation bilaterally. No wheezes, rales, or rhonchi.  Ext: 1+ pitting BLE edema. Skin: Warm and dry. No rashes or wounds.  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 She was admitted on 10/9 for suspected septic shock with fever to 102.7, leukocytosis to 35, lactate of 2.94, and hypotension despite 4L IVF resuscitation. She required ICU care for pressor support for hypotension. Her blood and urine cultures have been negative although the cultures were drawn after she received a dose of ceftriaxone at her SNF. Source of infection is thought to be UTI or breast surgical site infection. UTI is more likely as she has a history of frequent UTIs and there is no sign of surgical site infection on physical exam. She was weaned off pressors 10/11 and transferred to the internal medicine service from the ICU  on 10/12. Her antibiotic regimen was narrowed from Vanc and Zosyn to Ceftriaxone on 10/11. She is gradually weaning off stress dose steroids. Her leukocytosis and lactic acidosis have resolved.  Sepsis - Most likely 2/2 UTI. Blood cx and urine cx have been negative, but were drawn after abx administration. - Currently on day 5 of IV abx with ceftriaxone. Plan to transition to Keflex PO tomorrow for a 5-day course. - Bps have been in the 16X-096E systolic off of pressors - Weaning down stress dose steroids. - She is up 9L since admission and has 1+ lower extremity pitting edema. No respiratory distress or crackles on exam. Does not require diuresis at this point.   - She currently lives in a SNF, but her daughter would like her to be transferred to a different SNF. Social work has been consulted. The patient will likely be ready for discharge to SNF tomorrow.  Plan - Continue ceftriaxone (day 5 of IV abx) - Hydrocortisone 50mg  today. Stop steroids tomorrow.  Hypertension - Continue to hold home lisinopril for soft blood pressures   Dispo: Anticipated discharge in approximately 1-2 day(s).   Quron Ruddy, Andree Elk, MD 11/18/2017, 6:45 AM Pager: [phone]

## 2017-11-19 DIAGNOSIS — I69319 Unspecified symptoms and signs involving cognitive functions following cerebral infarction: Secondary | ICD-10-CM

## 2017-11-19 DIAGNOSIS — F319 Bipolar disorder, unspecified: Secondary | ICD-10-CM

## 2017-11-19 LAB — GLUCOSE, CAPILLARY
GLUCOSE-CAPILLARY: 73 mg/dL (ref 70–99)
Glucose-Capillary: 79 mg/dL (ref 70–99)
Glucose-Capillary: 73 mg/dL (ref 70–99)
Glucose-Capillary: 89 mg/dL (ref 70–99)

## 2017-11-19 MED ORDER — CEPHALEXIN 500 MG PO CAPS
500.0000 mg | ORAL_CAPSULE | Freq: Two times a day (BID) | ORAL | Status: DC
  Administered 2017-11-19 – 2017-11-23 (×9): 500 mg via ORAL
  Filled 2017-11-19 (×9): qty 1

## 2017-11-19 NOTE — Care Management Important Message (Signed)
Important Message  Patient Details  Name: Carolyn Parrish MRN: [id number] Date of Birth: 1940/12/21   Medicare Important Message Given:  Yes    Delorse Lek 11/19/2017, 2:43 PM

## 2017-11-19 NOTE — Progress Notes (Signed)
   Subjective: No overnight events. Ms. Topp feels well and denies any pain. She has no new symptoms or concerns today.   Objective:  Vital signs in last 24 hours: Vitals:   11/18/17 1656 11/18/17 1727 11/18/17 2158 11/19/17 0501  BP: (!) 114/57 (!) 121/54 120/68 132/71  Pulse: 70 67 68 61  Resp: 18  18 18   Temp: 98.3 F (36.8 C)  98.2 F (36.8 C) 98 F (36.7 C)  TempSrc: Oral  Oral Oral  SpO2: 100% 96% 98% 99%  Weight:      Height:       Physical Exam Constitutional:Laying comfortably in bed. Cardiovascular:Normal rateand regular rhythm. No murmurs, rubs, or gallops. Pulmonary/Chest:Effort normal. Clear to auscultation bilaterally. No wheezes, rales, or rhonchi.Left chest JP drains x2, one with serosanguinous fluid, one with clear yellow fluid. Ext:1+ pitting BLE edema.  Skin: Warm and dry. No rashes or wounds.  Assessment/Plan:  Active Problems:   Encounter for central line placement   Sepsis ([place])   Septic shock ([place])   AKI (acute kidney injury) ([place])   Diabetes mellitus treated with oral medication ([place])   Lactic acidosis   Bipolar disorder ([place])   History of completed stroke  Ms. 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 She required ICU care for pressor support for hypotension. Her blood and urine cultures have been negative although the cultures were drawn after she received a dose of ceftriaxone at her SNF. Source of infection is thought to be UTI or breast surgical site infection. UTI is more likely as she has a history of frequent UTIs and there is no sign of surgical site infection on physical exam. She was weaned off pressors10/11 and transferred to the  internal medicine service from the ICU on 10/12. Her antibiotic regimen was narrowed from Vanc and Zosyn to Ceftriaxone on 10/11, and received 5 days of IV abx in total. She is gradually weaning off stress dose steroids. Her leukocytosis and lactic acidosis have resolved.  Sepsis - Most likely 2/2 UTI. Blood cx and urine cx have been negative, but were drawn after abx administration. - Start oral antibiotic therapy with Keflex - Bps have been in the 412I-786V systolic - Weaned off of stress dose steroids - She currently lives in a SNF, but her daughter would like her to be transferred to a different SNF. Social work has been consulted. The patient is medically appropriate for discharge to SNF pending placement. Plan - Start Keflex 500mg  BID (day 1/5)  Breast cancer s/p R mastectomy 9/24 - JP drains x2 with serosanguinous and clear yellow fluid - Discussed case with Dr. Brantley Stage of surgery today. He plans to see her this afternoon if she has not discharged to SNF. He will arrange outpatient f/u for drain removal.  Plan - Surgery consulted, appreciate recs  Hypertension - Continue to hold home lisinopril for soft blood pressures  Dispo: Anticipated discharge pending SNF placement.  Tylin Stradley, Andree Elk, MD 11/19/2017, 6:31 AM Pager: [phone]

## 2017-11-19 NOTE — Clinical Social Work Note (Signed)
Clinical Social Work Assessment  Patient Details  Name: Carolyn Parrish MRN: [id number] Date of Birth: 10/27/1940  Date of referral:  11/16/17               Reason for consult:  Facility Placement                Permission sought to share information with:  Facility Sport and exercise psychologist, Family Supports Permission granted to share information::  No  Name::     Animal nutritionist::  SNFs  Relationship::  Daughter  Contact Information:  [phone]  Housing/Transportation Living arrangements for the past 2 months:  [place] of Information:  Adult Children Patient Interpreter Needed:  None Criminal Activity/Legal Involvement Pertinent to Current Situation/Hospitalization:  No - Comment as needed Significant Relationships:  Adult Children Lives with:  Facility Resident Do you feel safe going back to the place where you live?  No Need for family participation in patient care:  Yes (Comment)  Care giving concerns:  CSW received consult for possible SNF placement at time of discharge. CSW spoke with patient's daughter. Patient has resided at [facility] long term care but patient's daughter is interested in sending her to a different facility. CSW to continue to follow and assist with discharge planning needs.   Social Worker assessment / plan:  CSW spoke with patient's daughter concerning discharge plan.  Employment status:  Retired Nurse, adult PT Recommendations:  Not assessed at this time Information / Referral to community resources:  [place]  Patient/Family's Response to care:  Patient's daughter reports that she does not like the care at [facility] and she has been searching for a different SNF. She provided CSW with her top choices, however, none of them have long term care beds available. CSW provided the list of available facilities and patient's daughter states that she would like to go tour  them.   Patient/Family's Understanding of and Emotional Response to Diagnosis, Current Treatment, and Prognosis:  Patient/family is realistic regarding therapy needs and expressed being hopeful for a different SNF placement. Patient's daughter expressed understanding of CSW role and discharge process as well as medical condition. No questions/concerns about plan or treatment.    Emotional Assessment Appearance:  Appears stated age Attitude/Demeanor/Rapport:  Lethargic Affect (typically observed):  Quiet Orientation:  Oriented to Self, Oriented to Place, Oriented to  Time, Oriented to Situation Alcohol / Substance use:  Not Applicable Psych involvement (Current and /or in the community):  No (Comment)  Discharge Needs  Concerns to be addressed:  Care Coordination Readmission within the last 30 days:  Yes Current discharge risk:  None Barriers to Discharge:  Continued Medical Work up   [employer], LCSW 11/19/2017, 1:44 PM

## 2017-11-19 NOTE — Progress Notes (Signed)
Medicine attending: I examined this patient today and I concur with the evaluation and management plan as recorded by resident physician Dr Vilma Prader. 77 year old woman known to me from initial encounter in the emergency department on the day of admission.  She was clinically septic with hypotension, leukocytosis, and lactic acidosis.  Blood pressure failed to respond to initial aggressive fluid resuscitation.  She was subsequently managed in the intensive care unit with appropriate pressor support until otherwise stable.  Cultures have returned negative but based on history of recurrent urinary tract infections, urine is the presumed source of her sepsis. She is doing well at this time.  Back to her baseline.  Cognitive and neurologic deficits from previous stroke but interactive, cooperative, dysarthric speech. Recent mastectomy.  Surgical drain still in place.  Surgery to evaluate for drain removal. We will transition to oral antibiotics today.  Begin discharge planning.  She will need to be placed in a skilled nursing facility.

## 2017-11-19 NOTE — Progress Notes (Signed)
Pt less verbal   Flaps viable  Drains serous no signs of infection    Keep drains   Follow up next week

## 2017-11-19 NOTE — NC FL2 (Signed)
[place] LEVEL OF CARE SCREENING TOOL     IDENTIFICATION  Patient Name: Carolyn Parrish Birthdate: Feb 01, 1941 Sex: female Admission Date (Current Location): 11/14/2017  [facility] and [place] Number:  Herbalist and Address:  [facility] [place] [address] [place], [place] [address]      Provider Number: [id number]  Attending Physician Name and Address:  Annia Belt, MD  Relative Name and Phone Number:  Kennith Gain, daughter, [phone]    Current Level of Care: Hospital Recommended Level of Care: [place] Prior Approval Number:    Date Approved/Denied:   PASRR Number: [id number]  Discharge Plan: SNF    Current Diagnoses: Patient Active Problem List   Diagnosis Date Noted  . Sepsis ([place]) 11/14/2017  . Septic shock ([place]) 11/14/2017  . AKI (acute kidney injury) ([place])   . Diabetes mellitus treated with oral medication ([place])   . Lactic acidosis   . Bipolar disorder ([place])   . History of completed stroke   . Breast cancer, stage 2, left ([place]) 10/30/2017  . Encounter for central line placement 09/12/2017  . CVA (cerebrovascular accident) ([place]) 09/12/2017  . Diabetes mellitus type 2, controlled, without complications ([place]) 07/30/7626  . Schizoaffective disorder ([place]) 09/12/2017  . Malignant neoplasm of upper-inner quadrant of left breast in female, estrogen receptor positive ([place]) 09/07/2017    Orientation RESPIRATION BLADDER Height & Weight     Self, Time, Situation, Place  Normal Incontinent, External catheter Weight: 93 kg Height:  5\' 10"  (177.8 cm)  BEHAVIORAL SYMPTOMS/MOOD NEUROLOGICAL BOWEL NUTRITION STATUS  (NA)   Incontinent Diet(Please see DC Summary)  AMBULATORY STATUS COMMUNICATION OF NEEDS Skin   Extensive Assist Verbally Surgical wounds(Closed incision on breast)                       Personal Care Assistance Level of Assistance  Bathing, Feeding, Dressing Bathing Assistance:  Maximum assistance Feeding assistance: Limited assistance Dressing Assistance: Limited assistance     Functional Limitations Info  Sight, Hearing, Speech Sight Info: Adequate Hearing Info: Adequate Speech Info: Adequate    SPECIAL CARE FACTORS FREQUENCY                       Contractures Contractures Info: Not present    Additional Factors Info  Code Status, Allergies, Insulin Sliding Scale Code Status Info: Full Allergies Info: Allergies:  Atorvastatin   Insulin Sliding Scale Info: 3x daily with meals       Current Medications (11/19/2017):  This is the current hospital active medication list Current Facility-Administered Medications  Medication Dose Route Frequency Provider Last Rate Last Dose  . acetaminophen (TYLENOL) tablet 650 mg  650 mg Oral Q6H PRN Lorella Nimrod, MD       Or  . acetaminophen (TYLENOL) suppository 650 mg  650 mg Rectal Q6H PRN Lorella Nimrod, MD      . aspirin chewable tablet 81 mg  81 mg Oral Daily Lorella Nimrod, MD   81 mg at 11/19/17 0938  . cephALEXin (KEFLEX) capsule 500 mg  500 mg Oral Q12H Dorrell, Andree Elk, MD   500 mg at 11/19/17 1228  . Chlorhexidine Gluconate Cloth 2 % PADS 6 each  6 each Topical Daily Sampson Goon, MD   6 each at 11/18/17 1008  . divalproex (DEPAKOTE ER) 24 hr tablet 250 mg  250 mg Oral Daily Lorella Nimrod, MD   250 mg at 11/19/17 0937  .  docusate sodium (COLACE) capsule 100 mg  100 mg Oral BID Lorella Nimrod, MD   100 mg at 11/19/17 0937  . enoxaparin (LOVENOX) injection 40 mg  40 mg Subcutaneous Q24H Britt Boozer, RPH   40 mg at 11/18/17 2245  . famotidine (PEPCID) tablet 20 mg  20 mg Oral BID Collene Gobble, MD   20 mg at 11/19/17 1041  . insulin aspart (novoLOG) injection 0-9 Units  0-9 Units Subcutaneous TID WC Jennelle Human B, NP   2 Units at 11/18/17 1758  . polyethylene glycol (MIRALAX / GLYCOLAX) packet 17 g  17 g Oral Daily PRN Lorella Nimrod, MD      . sodium chloride flush (NS) 0.9 % injection  10-40 mL  10-40 mL Intracatheter Q12H Sampson Goon, MD   10 mL at 11/19/17 0940  . sodium chloride flush (NS) 0.9 % injection 10-40 mL  10-40 mL Intracatheter PRN Sampson Goon, MD   10 mL at 11/17/17 0442  . sodium chloride flush (NS) 0.9 % injection 3 mL  3 mL Intravenous Q12H Lorella Nimrod, MD   3 mL at 11/19/17 9450     Discharge Medications: Please see discharge summary for a list of discharge medications.  Relevant Imaging Results:  Relevant Lab Results:   Additional Information SSN: [id number] [place],

## 2017-11-19 NOTE — Discharge Summary (Signed)
Name: Carolyn Parrish MRN: [id number] DOB: 1940-12-29 77 y.o. PCP: Garwin Brothers, MD  Date of Admission: 11/14/2017  7:31 AM Date of Discharge: 11/23/17 Attending Physician: Aldine Contes  Discharge Diagnosis: 1. Sepsis 2. AKI 3. Breast cancer s/p L mastectomy 4. HTN  Discharge Medications: Allergies as of 11/23/2017      Reactions   Atorvastatin    UNSPECIFIED REACTION       Medication List    STOP taking these medications   doxycycline 100 MG capsule Commonly known as:  VIBRAMYCIN   lisinopril 2.5 MG tablet Commonly known as:  PRINIVIL,ZESTRIL   ROCEPHIN 1 g injection Generic drug:  cefTRIAXone   sodium chloride 0.9 % infusion     TAKE these medications   acetaminophen 325 MG tablet Commonly known as:  TYLENOL Take 325-650 mg by mouth every 4 (four) hours as needed (650mg  for arthritis pain, 325mg  for fever >101).   aspirin 81 MG chewable tablet Chew 81 mg by mouth daily.   camphor-menthol lotion Commonly known as:  SARNA Apply 1 application topically 2 (two) times daily as needed for itching.   cephALEXin 500 MG capsule Commonly known as:  KEFLEX Take 1 capsule (500 mg total) by mouth once for 1 dose.   divalproex 250 MG 24 hr tablet Commonly known as:  DEPAKOTE ER Take 250 mg by mouth daily.   docusate sodium 100 MG capsule Commonly known as:  COLACE Take 100 mg by mouth 2 (two) times daily.   magnesium hydroxide 400 MG/5ML suspension Commonly known as:  MILK OF MAGNESIA Take 30 mLs by mouth daily as needed for mild constipation.   metFORMIN 500 MG tablet Commonly known as:  GLUCOPHAGE Take 500 mg by mouth daily.   MYLANTA 200-200-20 MG/5ML suspension Generic drug:  alum & mag hydroxide-simeth Take 30 mLs by mouth every 4 (four) hours as needed for indigestion or heartburn.   oxyCODONE 5 MG immediate release tablet Commonly known as:  Oxy IR/ROXICODONE Take 1 tablet (5 mg total) by mouth every 4 (four) hours as needed for moderate  pain.   polyethylene glycol packet Commonly known as:  MIRALAX / GLYCOLAX Take 17 g by mouth daily.   PROBIOTIC DAILY PO Take 1 capsule by mouth daily. x7 days   vitamin B-12 500 MCG tablet Commonly known as:  CYANOCOBALAMIN Take 500 mcg by mouth daily.       Disposition and follow-up:   CarolynCarolyn Parrish was discharged from [facility] in Good condition.  At the hospital follow up visit please address:  1.  Urosepsis: Completed 10 days of antibiotics. Had severe hypotension I and required pressors in the ICU. Stable at discharge.  Hypertension: She had some hypotension requiring pressor support in the ICU, her home lisinopril was held during admission, she remained normotensive. Please assess if she needs to restart this.   2.  Labs / imaging needed at time of follow-up: None  3.  Pending labs/ test needing follow-up: None  Follow-up Appointments: Contact information for after-discharge care    Destination    [facility] .   Service:  Skilled Nursing Contact information: [address] [place] Hospital Course by problem list: 1. 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 She was admitted on 10/9 for suspected septic shock with fever to 102.7, leukocytosis to 35, lactate of 2.94, and hypotension despite 4L IVF resuscitation. She required ICU care for pressor support for hypotension. Her blood and urine cultures have been negative although the cultures were drawn after she received a dose of ceftriaxone at her SNF. Source of infection is thought to be UTI or breast surgical site infection. UTI is more likely as she has a history of frequent UTIs and there is no sign of surgical site infection on physical exam. She was  weaned off pressors 10/11 and transferred to the internal medicine service from the ICU on 10/12. Her blood pressures remained stable off of the pressors. She was gradually weaned off stress dose steroids. Her leukocytosis and lactic acidosis have resolved. Her antibiotic regimen was narrowed from Vanc and Zosyn to Ceftriaxone on 10/11. After 5 days of IV antibiotics, she was transitioned to PO Keflex for another 5 days (to end 10/18)  2. AKI: Cr on admission was 3.28. AKI likely prerenal in the setting of sepsis. AKI resolved with fluid resuscitation. Last documented Cr 0.92.  3. Breast cancer s/p L mastectomy on 9/24: No signs of infection at surgical site. JP drains x2 with serosanguinous fluid. Patient followed by her surgeon Dr. Brantley Stage while hospitalized. He advised outpatient follow-up for drain removal. His office will arrange f/u.  4. HTN: Home regimen includes lisinopril 2.5mg  daily. Held lisinopril during hospitalization for soft pressures. Upon discharge BP 122/57. Held on discharge.   Discharge Vitals:   BP (!) 119/52 (BP Location: Right Arm)   Pulse 67   Temp 98.8 F (37.1 C) (Oral)   Resp 17   Ht 5\' 10"  (1.778 m)   Wt 93 kg   SpO2 98%   BMI 29.42 kg/m   Pertinent Labs, Studies, and Procedures:  CBC Latest Ref Rng & Units 11/18/2017 11/16/2017 11/15/2017  WBC 4.0 - 10.5 K/uL 10.5 11.1(H) 21.1(H)  Hemoglobin 12.0 - 15.0 g/dL 10.3(L) 10.4(L) 10.6(L)  Hematocrit 36.0 - 46.0 % 33.8(L) 35.2(L) 34.2(L)  Platelets 150 - 400 K/uL 252 277 237   CMP Latest Ref Rng & Units 11/18/2017 11/17/2017 11/16/2017  Glucose 70 - 99 mg/dL 123(H) 134(H) 147(H)  BUN 8 - 23 mg/dL 14 9 7(L)  Creatinine 0.44 - 1.00 mg/dL 0.92 0.76 0.70  Sodium 135 - 145 mmol/L 140 140 142  Potassium 3.5 - 5.1 mmol/L 4.2 3.9 3.6  Chloride 98 - 111 mmol/L 110 110 113(H)  CO2 22 - 32 mmol/L 22 24 24   Calcium 8.9 - 10.3 mg/dL 7.8(L) 7.9(L) 7.5(L)  Total Protein 6.5 - 8.1 g/dL - - -  Total Bilirubin 0.3 - 1.2  mg/dL - - -  Alkaline Phos 38 - 126 U/L - - -  AST 15 - 41 U/L - - -  ALT 0 - 44 U/L - - -     CXR 10/9 1. RIGHT jugular central venous catheter tip projects over the expected location of the LOWER SVC. No complicating features. 2. Suboptimal inspiration which accounts for mild LEFT basilar atelectasis. No acute cardiopulmonary disease otherwise.  Discharge Instructions: Discharge Instructions    Call MD for:  difficulty breathing, headache or visual disturbances   Complete by:  As directed    Call MD for:  extreme fatigue   Complete by:  As directed    Call MD for:  hives   Complete by:  As directed    Call MD for:  persistant dizziness or light-headedness   Complete by:  As directed    Call MD for:  persistant nausea and vomiting   Complete by:  As directed    Call MD for:  redness, tenderness, or signs of infection (pain, swelling, redness, odor or green/yellow discharge around incision site)   Complete by:  As directed    Call MD for:  severe uncontrolled pain   Complete by:  As directed    Call MD for:  temperature >100.4   Complete by:  As directed    Diet - low sodium heart healthy   Complete by:  As directed    Discharge instructions   Complete by:  As directed    Ashtynn D Barnet [place],   It has been a pleasure working with you and we are glad you're feeling better. You were hospitalized for urinary tract infection.   For your urinary tract infection,  You completed 4.5 days of antibiotics for this, you just need to have one more dose of keflex 500 mg in the afternoon   Follow up with your primary care provider in 1-2 weeks  If your symptoms worsen or you develop new symptoms, please seek medical help whether it is your primary care provider or emergency department.  If you have any questions about this hospitalization please call [phone].   Increase activity slowly   Complete by:  As directed       Signed: Asencion Noble, MD 11/23/2017, 1:56 PM     Pager: [phone]

## 2017-11-20 ENCOUNTER — Other Ambulatory Visit: Payer: Self-pay

## 2017-11-20 DIAGNOSIS — E860 Dehydration: Secondary | ICD-10-CM

## 2017-11-20 LAB — CULTURE, BLOOD (ROUTINE X 2)
CULTURE: NO GROWTH
Culture: NO GROWTH
Special Requests: ADEQUATE
Special Requests: ADEQUATE

## 2017-11-20 LAB — GLUCOSE, CAPILLARY
GLUCOSE-CAPILLARY: 171 mg/dL — AB (ref 70–99)
GLUCOSE-CAPILLARY: 92 mg/dL (ref 70–99)
GLUCOSE-CAPILLARY: 111 mg/dL — AB (ref 70–99)
Glucose-Capillary: 74 mg/dL (ref 70–99)

## 2017-11-20 MED ORDER — ENSURE ENLIVE PO LIQD
237.0000 mL | Freq: Two times a day (BID) | ORAL | Status: DC
  Administered 2017-11-21 – 2017-11-23 (×5): 237 mL via ORAL

## 2017-11-20 NOTE — Progress Notes (Signed)
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 She was admitted on 10/9 for suspected septic.  Source of infection is suspected UTI.      PT Comments    Pt performed transfer to edge of bed with max cues for encouragement.  Pt is slow and guarded with max cues for mobility.  Pt presents with bowel incontinence and required multiple bouts of rolling to R and L to achieve pericare.    Follow Up Recommendations  SNF     Equipment Recommendations  None recommended by PT    Recommendations for Other Services       Precautions / Restrictions Precautions Precautions: Fall Restrictions Weight Bearing Restrictions: No    Mobility  Bed Mobility Overal bed mobility: Needs Assistance Bed Mobility: Rolling;Supine to Sit;Sit to Supine Rolling: Mod assist;+2 for physical assistance Sidelying to sit: Mod assist;+2 for physical assistance   Sit to supine: Max assist;+2 for physical assistance   General bed mobility comments: Pt able to come to sitting with use of LUE to pull on PTA for rail support.  Pt required increased assistance to return to bed.  Pt is slow and guarded and required use of bed pad to advance to edge of bed.  Pt able to sit edge of bed x6 min and carryover on conversation with L UE support holding to railing.  Pt began to fatigue and noticeable shaking in L arm observed as she fatigued.    Transfers Overall transfer level: (deffered at this time.  )                  Ambulation/Gait                 Stairs             Wheelchair Mobility    Modified Rankin (Stroke Patients Only)       Balance Overall balance assessment: Needs assistance   Sitting  balance-Leahy Scale: Fair Sitting balance - Comments: with LUE support holding to railing and feet flat on floor, she is POOR without feet flat on floor and LUE support.                                      Cognition Arousal/Alertness: Awake/alert Behavior During Therapy: Agitated;Flat affect Overall Cognitive Status: No family/caregiver present to determine baseline cognitive functioning                                 General Comments: able to follow basic commands; oriented to location and month      Exercises      General Comments        Pertinent Vitals/Pain Pain Assessment: No/denies pain    Home Living                      Prior Function            PT Goals (current goals can now be found in the care plan section) Acute Rehab PT Goals Patient Stated Goal: none stated Potential to Achieve Goals: Fair Progress towards  PT goals: Progressing toward goals    Frequency    Min 2X/week      PT Plan Current plan remains appropriate    Co-evaluation              AM-PAC PT "6 Clicks" Daily Activity  Outcome Measure  Difficulty turning over in bed (including adjusting bedclothes, sheets and blankets)?: A Lot Difficulty moving from lying on back to sitting on the side of the bed? : Unable Difficulty sitting down on and standing up from a chair with arms (e.g., wheelchair, bedside commode, etc,.)?: Unable Help needed moving to and from a bed to chair (including a wheelchair)?: Total Help needed walking in hospital room?: Total Help needed climbing 3-5 steps with a railing? 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

## 2017-11-20 NOTE — Progress Notes (Signed)
Medicine attending: I examined this patient today and I concur with the evaluation and management plan as recorded by resident physician Dr Vilma Prader.  We appreciate general surgery follow-up.  Drains will be left in place at this time with plan follow-up in their office in 1 week. She remains afebrile day 6 total antibiotics for culture negative, presumed urosepsis.  She was transitioned to oral Keflex with plan to continue until October 18. Renal function back to normal.  Blood sugars well controlled.  Hemoglobin stable. Discharge planning in progress.  Waiting for a skilled nursing bed to become available.

## 2017-11-20 NOTE — Progress Notes (Signed)
   Subjective: No overnight events. She denies pain. Carolyn Parrish only concern today is that she would like grape juice for breakfast. She seemed to thoroughly enjoy her juice once it was brought to her.  Objective:  Vital signs in last 24 hours: Vitals:   11/19/17 0501 11/19/17 1332 11/19/17 2114 11/20/17 0624  BP: 132/71 132/70 114/76 129/69  Pulse: 61 63 69 72  Resp: 18 14    Temp: 98 F (36.7 C) 98.1 F (36.7 C) 98.3 F (36.8 C) 98.2 F (36.8 C)  TempSrc: Oral Oral Oral Oral  SpO2: 99% 99% 98% 99%  Weight:      Height:       Physical Exam Constitutional:Laying comfortably in bed. Cardiovascular:Normal rateand regular rhythm. No murmurs, rubs, or gallops. Pulmonary/Chest:Left chest JP drains x2, one with serosanguinous fluid, one with clear yellow fluid. Ext:1+ pitting BLE edema.  Skin: Warm and dry. No rashes or wounds.  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 She required ICU care for pressor support for hypotension. Her blood and urine cultures have been negative although the cultures were drawn after she received a dose of ceftriaxone at her SNF. Source of infection is thought to be UTI or breast surgical site infection. UTI is more likely as she has a history of frequent UTIs and there is no sign of surgical site infection on physical exam. She was weaned off pressors10/11 and transferred  to the internal medicine service from the ICU on 10/12. She was also weaned off stress dose steroids. Her antibiotic regimen was narrowed from Vanc and Zosyn to Ceftriaxone on 10/11, and received 5 days of IV abx in total. Transitioned to PO Keflex on 10/14. Her leukocytosis and lactic acidosis have resolved.  Sepsis - Most likely 2/2 UTI. Blood cx and urine cx have been negative, but were drawn after abx administration. Currently on Keflex. - She currently lives in a SNF, but her daughter would like her to be transferred to a different SNF. Social work has been consulted. The patient is medically appropriate for discharge to SNF pending placement. Plan - Continue Keflex 500mg  BID (day 2/5, to end 10/18)  Breast cancer s/p R mastectomy 9/24 - JP drains x2 with serosanguinous and clear yellow fluid - Dr. Brantley Stage of surgery evaluated the patient yesterday. He has no concern for infection and recommends outpatient f/u for drain removal. F/u will be arranged by his office. Plan - Outpatient f/u with Dr. Brantley Stage  Hypertension - Patient currently normotensive Plan - Continue to hold home lisinopril  Dispo: Anticipated discharge pending SNF placement.  Dorrell, Andree Elk, MD 11/20/2017, 6:26 AM Pager: [phone]

## 2017-11-20 NOTE — Progress Notes (Signed)
CSW spoke to patient's daughter, Kennith Gain, on the phone, and gave SNF bed offers. Daughter chose [facility] and CSW confirmed the facility will have a long term bed for patient to transition into after some rehab.   [facility] to start patient's [facility] authorization today. Updated MD. CSW to follow and support with discharge planning.  Estanislado Emms, [place]

## 2017-11-21 DIAGNOSIS — N390 Urinary tract infection, site not specified: Secondary | ICD-10-CM

## 2017-11-21 LAB — GLUCOSE, CAPILLARY
GLUCOSE-CAPILLARY: 130 mg/dL — AB (ref 70–99)
Glucose-Capillary: 97 mg/dL (ref 70–99)
Glucose-Capillary: 128 mg/dL — ABNORMAL HIGH (ref 70–99)
Glucose-Capillary: 115 mg/dL — ABNORMAL HIGH (ref 70–99)

## 2017-11-21 NOTE — Progress Notes (Signed)
Initial Nutrition Assessment  DOCUMENTATION CODES:   Not applicable  INTERVENTION:  Continue Ensure Enlive BID Magic Cup TID: to provide 290kcal, 9g protein per serving  NUTRITION DIAGNOSIS:   Predicted suboptimal nutrient intake related to acute illness(sepsis d/t UTI; s/p left mastectomy x3 weeks) as evidenced by estimated needs(limited food acceptance insufficent to meet increased needs).    GOAL:   Patient will meet greater than or equal to 90% of their needs   MONITOR:   PO intake, Supplement acceptance  REASON FOR ASSESSMENT:   Malnutrition Screening Tool    ASSESSMENT:  77yo SNF resident w/ recent left mastectomy on 9/24 w/ sepsis secondary to UTI. Pt w/ limited motor skills after suffering stroke in 1996 and chronic behavioral disturbances d/t bipolar disorder. PMH: T2DM, CVA, Schizoaffective disorder, stage2 Breast Ca, CHF, HLD, HTN   Pt awake and mostly starred at ceiling, family was not at beside at visit. Pt had limited responses to questions and difficult to understand d/t speech deficits from stroke. Pt reported having applesauce and orange juice for breakfast and stated she was having grilled chx and diet coke for lunch. Pt became frustrated when asked about favorite foods and items served at [facility]. Pt stated she did not like food and began asking for diet coke repeatedly. RD let pt know that the lunch tray would be delivered soon.   Medications: Depakote, Colace, Pepcid, novoLog 0-9 (w/meals) Labs: Ca corrects 9.5 for low albumin 10/24/17 HgbA1c: 6.3     NUTRITION - FOCUSED PHYSICAL EXAM:  Unable to complete d/t patients altered mental status and generalized BUE BLE +1 mild pitting edema    Most Recent Value  Orbital Region  Unable to assess [Generalized edema may mask depletion]  Upper Arm Region  Unable to assess  Thoracic and Lumbar Region  Unable to assess  Buccal Region  Unable to assess  [place] Region  Unable to assess  Clavicle Bone Region  Unable  to assess  Clavicle and Acromion Bone Region  Unable to assess  Scapular Bone Region  Unable to assess  Dorsal Hand  Unable to assess  Patellar Region  Unable to assess  Anterior Thigh Region  Unable to assess  Posterior Calf Region  Unable to assess  Edema (RD Assessment)  Mild [BUE/BLE non-ptting edema]  Hair  Reviewed  Eyes  Reviewed  Mouth  Unable to assess  Skin  Reviewed  Nails  Unable to assess       Diet Order:   Diet Order            Diet heart healthy/carb modified Room service appropriate? Yes; Fluid consistency: Thin  Diet effective now              EDUCATION NEEDS:   No education needs have been identified at this time  Skin:  Skin Assessment: Reviewed RN Assessment(incision; left breast)  Last BM:  10/14; Type 6, Brown, Med  Height:   Ht Readings from Last 1 Encounters:  11/14/17 5\' 10"  (1.778 m)    Weight:   Wt Readings from Last 1 Encounters:  11/14/17 93 kg    Ideal Body Weight:  68.2 kg  BMI:  Body mass index is 29.42 kg/m.  Estimated Nutritional Needs:   Kcal:  9417-4081  Protein:  110-121g  Fluid:  2L   Lajuan Lines, RD, LDN  After Hours/Weekend Pager: [phone]

## 2017-11-21 NOTE — Progress Notes (Addendum)
  Date: 11/21/2017  Patient name: Carolyn Parrish  Medical record number: [id number]  Date of birth: July 16, 1940   I saw and examined the patient.  I reviewed the resident's note and agree with resident's finding and plan as documented in the resident's note.  Patient states that she feels well today with no new complaints.  Patient was initially admitted with sepsis likely secondary to UTI.  She initially required pressors and in ICU stay secondary to her hypotension.  She is much improved now.  We will complete a course of antibiotics for her UTI.  No further work-up at this time.  Patient is stable for discharge to SNF once bed is available.  Aldine Contes, MD 11/21/2017, 2:15 PM

## 2017-11-21 NOTE — Progress Notes (Signed)
   Subjective: [name] reports that she is doing well today, no acute events overnight.  She reports that she has slept denies any pain at the moment.  Denies any fevers or chills overnight.  No new concerns today.  We discussed the plan for today's treatment.  Objective:  Vital signs in last 24 hours: Vitals:   11/20/17 0624 11/20/17 1305 11/20/17 2204 11/21/17 0613  BP: 129/69 (!) 128/59 120/61 120/67  Pulse: 72 66 66 72  Resp:  14 18 18   Temp: 98.2 F (36.8 C) 98.3 F (36.8 C) 98.3 F (36.8 C) 98.1 F (36.7 C)  TempSrc: Oral Oral Oral Oral  SpO2: 99% 100% 98% 97%  Weight:      Height:        General: Resting comfortably, no acute distress Cardiac: RRR, normal S1, S2, no murmurs, rubs or gallops  Pulmonary: Lungs CTA bilaterally, no wheezing, rhonchi or rales  Extremity: Bilateral lower extremity 1+ pitting edema  Psychiatry: Normal mood and affect     Assessment/Plan:  Active Problems:   Encounter for central line placement   Sepsis ([place])   Septic shock (HCC)   AKI (acute kidney injury) ([place])   Diabetes mellitus treated with oral medication ([place])   Lactic acidosis   Bipolar disorder ([place])   History of completed stroke   Dehydration This is a 77 year old female with history of bipolar disorder, hypertension, diabetes, 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  Admitted 10/9 for suspected septic shock with a fever to 102.7, leukocytosis of 35, lactate of 2.94, and hypotension after 4 L of IV fluids.  She was sent to the ICU for pressor support for hypotension.  Blood cultures and urine cultures have all been negative however she did receive a dose of antibiotics in the SNF.  Suspected source of a UTI with urosepsis.  Transferred back to the medicine service on 1011.  She is currently p.o. Keflex, transition from a 5-day course of IV antibiotics.  Sepsis likely secondary to UTI: Completed a 5-day course of IV antibiotics and is  currently on Keflex 500 mg twice daily.  She has remained afebrile overnight and has had no fevers or chills.  Her last WBC was normal.  She is currently medically stable and can transfer to SNF to complete her Keflex there. -Continue Keflex 500 mg twice daily(day 3/5, last day 10/18)  Breast cancer status post mastectomy on 9/24 - Currently has JP drains in place with serosanguineous and clear yellow fluid.  Been evaluated by surgery and they do not suspect infection from this.  Recommend outpatient follow-up for drain removal. -Outpatient follow-up with Dr. Brantley Stage  Hypertension: Currently remains normotensive.  Continue home lisinopril.  Can discharge on this.  Dispo: Anticipated discharge is pending SNF placement.   Asencion Noble, MD 11/21/2017, 6:38 AM Pager: [phone]

## 2017-11-22 LAB — GLUCOSE, CAPILLARY
GLUCOSE-CAPILLARY: 104 mg/dL — AB (ref 70–99)
Glucose-Capillary: 118 mg/dL — ABNORMAL HIGH (ref 70–99)
Glucose-Capillary: 130 mg/dL — ABNORMAL HIGH (ref 70–99)
Glucose-Capillary: 194 mg/dL — ABNORMAL HIGH (ref 70–99)

## 2017-11-22 NOTE — Progress Notes (Signed)
Internal Medicine Attending:   I saw and examined the patient. I reviewed the resident's note and I agree with the resident's findings and plan as documented in the resident's note.  Patient feels well today with no new complaints.  Patient was initially admitted to the hospital with sepsis likely secondary to UTI.  She will complete her 10-day course of antibiotics tomorrow.  She is awaiting placement in SNF.  No further work-up at this time.  Patient is stable for discharge to SNF once bed is available.

## 2017-11-22 NOTE — Progress Notes (Signed)
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 She was admitted on 10/9 for suspected septic.  Source of infection is suspected UTI.      PT Comments    Pt performed supine LE exercises, followed by bed mobility and transfer OOB to standing.  Pt able to stand in [place] stedy but presents with excessive hip flexion and required mod +2 to extend hips into standing.  Plan next session for continued functional mobility.  Informed nursing of bed change after urinary incontinence.  Continue to recommend return back to SNF based on functional limitations.    Follow Up Recommendations  SNF     Equipment Recommendations  None recommended by PT    Recommendations for Other Services       Precautions / Restrictions Precautions Precautions: Fall Restrictions Weight Bearing Restrictions: No    Mobility  Bed Mobility Overal bed mobility: Needs Assistance Bed Mobility: Rolling;Supine to Sit;Sit to Supine Rolling: Mod assist;+2 for physical assistance Sidelying to sit: Mod assist;+2 for physical assistance     Sit to sidelying: Mod assist;+2 for physical assistance General bed mobility comments: Pt remains to require assistance to move LEs to edge of bed and to elevate trunk into sitting.  Mild unsteadiness.  Pt demonstrates with LOB posterior when not using UE support.    Transfers Overall transfer level: Needs assistance Equipment used: Ambulation equipment used(sara stedy.  ) Transfers: Sit to/from Stand(from elevated surface.  ) Sit to Stand: Mod assist;+2 physical assistance         General transfer comment: Cues for hand placement to and from seated surface on sara stedy cross bar.  Pt in  elevated position able to pull her self into standing to clear bottom from bed unassisted but required mod +2 to extend B hips to place sara stedy plates.  Pt performed additional transfer to stand from [place] stedy to return back to bed.    Ambulation/Gait Ambulation/Gait assistance: (NT)               Stairs             Wheelchair Mobility    Modified Rankin (Stroke Patients Only)       Balance Overall balance assessment: Needs assistance Sitting-balance support: No upper extremity supported;Feet supported Sitting balance-Leahy Scale: Poor Sitting balance - Comments: with UE support she is FAIR.                                      Cognition Arousal/Alertness: Awake/alert Behavior During Therapy: WFL for tasks assessed/performed(intermittent crying during session but not because she is sad.  ) Overall Cognitive Status: No family/caregiver present to determine baseline cognitive functioning                                 General Comments: able to follow basic commands; oriented to location and month, remember PT tech from last tx but not her name.        Exercises General Exercises - Lower Extremity Ankle Circles/Pumps: AROM;Both;10 reps;Supine Quad Sets: AROM;Both;10 reps;Supine Heel Slides: AAROM;Both;Supine;10 reps Hip ABduction/ADduction: AAROM;Both;10 reps;Supine  General Comments        Pertinent Vitals/Pain Pain Assessment: No/denies pain    Home Living                      Prior Function            PT Goals (current goals can now be found in the care plan section) Acute Rehab PT Goals Patient Stated Goal: none stated Potential to Achieve Goals: Fair Progress towards PT goals: Progressing toward goals    Frequency    Min 2X/week      PT Plan Current plan remains appropriate    Co-evaluation              AM-PAC PT "6 Clicks" Daily Activity  Outcome Measure  Difficulty turning over  in bed (including adjusting bedclothes, sheets and blankets)?: A Lot Difficulty moving from lying on back to sitting on the side of the bed? : Unable Difficulty sitting down on and standing up from a chair with arms (e.g., wheelchair, bedside commode, etc,.)?: Unable Help needed moving to and from a bed to chair (including a wheelchair)?: Total Help needed walking in hospital room?: Total Help needed climbing 3-5 steps with a railing? 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

## 2017-11-22 NOTE — Progress Notes (Signed)
   Subjective: Carolyn Parrish was doing well, no acute events overnight.  She denies any new complaints, denies any pain.  She reports that she slept well.  We discussed the plan for today and she is in agreement.  Objective:  Vital signs in last 24 hours: Vitals:   11/21/17 1424 11/21/17 2200 11/21/17 2215 11/22/17 0605  BP: (!) 143/63 (!) 130/59  (!) 120/53  Pulse: 74 65  78  Resp: 18 16  17   Temp: 97.9 F (36.6 C) 99.3 F (37.4 C)  99 F (37.2 C)  TempSrc:  Oral  Oral  SpO2: 98% (!) 87% 99% 96%  Weight:      Height:        General: Resting comfortably, no acute distress Cardiac: RRR, normal S1, S2, no murmurs, rubs or gallops  Pulmonary: Lungs CTA bilaterally, no wheezing, rhonchi or rales  Abdomen: Soft, non-tender, +bowel sounds, no guarding or masses noted    Assessment/Plan:  Active Problems:   Encounter for central line placement   Sepsis (HCC)   Septic shock (HCC)   AKI (acute kidney injury) ([place])   Diabetes mellitus treated with oral medication ([place])   Lactic acidosis   Bipolar disorder ([place])   History of completed stroke   Dehydration  This is a 77 year old female with history of bipolar disorder, hypertension, diabetes, 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  Admitted 10/9 for suspected septic shock with a fever to 102.7, leukocytosis of 35, lactate of 2.94, and hypotension after 4 L of IV fluids.  She was sent to the ICU for pressor support for hypotension.  Blood cultures and urine cultures have all been negative however she did receive a dose of antibiotics in the SNF.  Suspected source of a UTI with urosepsis. Transferred back to the medicine service on 10/11.  She is currently on p.o. Keflex, transitioned from a 5-day course of IV antibiotics.  Sepsis likely secondary to UTI: Completed a 5-day course of IV antibiotics, she is Keflex 500 mg BID day 4 of 5.  No fevers overnight, no acute events overnight.  She remains  stable for discharge to SNF. -Continue Keflex 500 mg twice daily(day 4/5, last day 10/18)  Breast cancer status mastectomy on 9/24: -JP drains in place.  -Outpatient follow-up with Dr. Brantley Stage for drain removal.  Hypertension: Patient is on home lisinopril and remains normotensive.  Dispo: Anticipated discharge is pending SNF placement.  Asencion Noble, MD 11/22/2017, 6:42 AM Pager: [phone]

## 2017-11-22 NOTE — Progress Notes (Signed)
Carolyn Parrish  [phone] Fax:(336) [phone]     ID: TANIESHA GLANZ DOB: Nov 19, 1940  MR#: [id number]  JYN#:[id number]  Patient Care Team: Garwin Brothers, MD as PCP - General (Internal Medicine) Erroll Luna, MD as Consulting Physician (General Surgery) Magrinat, Virgie Dad, MD as Consulting Physician (Oncology) Kyung Rudd, MD as Consulting Physician (Radiation Oncology) OTHER MD:  CHIEF COMPLAINT: Estrogen receptor positive breast cancer  CURRENT TREATMENT: Awaiting definitive surgery  HISTORY OF CURRENT ILLNESS: Carolyn Parrish was found to have a palpable mass in the left breast. She underwent bilateral diagnostic mammography with tomography and left breast ultrasonography at [facility] on 08/31/2017 showing: breast density category C. There was a suspicious mass in the left breast at the 9 o'clock retroareolar location involving the medial aspect of the nipple, measuring 2.8 x 1.6 x 1.0 cm. Sonographic evaluation of the left axilla demonstrates no suspicious lymphadenopathy. Limited mammographic evaluation of the right breast is unremarkable.  Accordingly on 09/05/2017 she proceeded to biopsy of the left breast area in question. The pathology from this procedure showed ([id number]): Invasive ductal carcinoma grade 2. Perineural invasion is identified. Prognostic indicators significant for: estrogen receptor, 100% positive and progesterone receptor, 80% positive, both with strong staining intensity. Proliferation marker Ki67 at 15%. HER2 not amplified with ratios HER2/CEP17 signals 1.70 and average HER2 copies per cell 4.75.   The patient's subsequent history is as detailed below.  INTERVAL HISTORY: [name] was scheduled today for follow up and treatment of her estrogen receptor positive breast cancer.  However she has had a rough course postop, with sepsis, and is currently in an SNF.  She underwent left mastectomy on 10/30/2017 with pathology showing  ([id number]): invasive ductal carcinoma grade II spanning 2.8 cm. Ductal carcinoma in situ, intermediate grade. Metastatic carcinoma in 1 out of 2 intramammary lymph nodes with extracapsular extension. The resection margins are negative for carcinoma.    REVIEW OF SYSTEMS:    PAST MEDICAL HISTORY: Past Medical History:  Diagnosis Date  . Cancer (HCC)    BREAST CANCER  . CHF (congestive heart failure) ([place])   . Diabetes mellitus without complication ([place])   . Heart failure ([place])   . Hyperlipidemia   . Hypertension   . Schizoaffective disorder ([place])   . Stroke [facility])   Polyosteoarthrtis and generalized muscle weakness.  Diabetes- takes metformin  PAST SURGICAL HISTORY: Past Surgical History:  Procedure Laterality Date  . CHOLECYSTECTOMY     2016  . MASTECTOMY COMPLETE / SIMPLE Left 10/30/2017  . SIMPLE MASTECTOMY WITH AXILLARY SENTINEL NODE BIOPSY Left 10/30/2017   Procedure: LEFT SIMPLE MASTECTOMY;  Surgeon: Erroll Luna, MD;  Location: [facility];  Service: General;  Laterality: Left;    Cholecystectomy 2016  FAMILY HISTORY No family history on file. The patient's father died of old age at 71. The patient's mother also died of old age at 82. The patient has 5 brothers and 6 sisters. The patient denies a family history of breast or ovarian cancer.  GYNECOLOGIC HISTORY:  No LMP recorded. Patient is postmenopausal. Age at first live birth: 77 years old She is [place] P4 (the last was still born).  Her LMP was age [age]. She never took HRT or oral contraception.    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 She has lived in a nursing home since having a stroke in 1996. The patient is widowed. The patient's daughter, [name] lives in [place], [place] and works as a Freight forwarder. The patient's son, [name] lives  in [place], [place] and works as a Administrator. The patient's daughter, [name] lives in [place] and works as a Customer service manager. The patient as 3 grandchildren. She is a Psychologist, forensic.       ADVANCED DIRECTIVES: The patient's daughter, [name] is her HCPOA, and she can be reached at [phone]   HEALTH MAINTENANCE: Social History   Tobacco Use  . Smoking status: Never Smoker  . Smokeless tobacco: Never Used  Substance Use Topics  . Alcohol use: Not Currently  . Drug use: Never     Colonoscopy:   PAP:   Bone density:    Allergies  Allergen Reactions  . Atorvastatin     UNSPECIFIED REACTION     No current facility-administered medications for this visit.    No current outpatient medications on file.   Facility-Administered Medications Ordered in Other Visits  Medication Dose Route Frequency Provider Last Rate Last Dose  . acetaminophen (TYLENOL) tablet 650 mg  650 mg Oral Q6H PRN Lorella Nimrod, MD       Or  . acetaminophen (TYLENOL) suppository 650 mg  650 mg Rectal Q6H PRN Lorella Nimrod, MD      . aspirin chewable tablet 81 mg  81 mg Oral Daily Lorella Nimrod, MD   81 mg at 11/22/17 0833  . cephALEXin (KEFLEX) capsule 500 mg  500 mg Oral Q12H Dorrell, Andree Elk, MD   500 mg at 11/22/17 0938  . Chlorhexidine Gluconate Cloth 2 % PADS 6 each  6 each Topical Daily Sampson Goon, MD   6 each at 11/22/17 [phone]  . divalproex (DEPAKOTE ER) 24 hr tablet 250 mg  250 mg Oral Daily Lorella Nimrod, MD   250 mg at 11/22/17 0832  . docusate sodium (COLACE) capsule 100 mg  100 mg Oral BID Lorella Nimrod, MD   100 mg at 11/22/17 0833  . enoxaparin (LOVENOX) injection 40 mg  40 mg Subcutaneous Q24H Britt Boozer, [place]   40 mg at 11/21/17 2213  . famotidine (PEPCID) tablet 20 mg  20 mg Oral BID Collene Gobble, MD   20 mg at 11/22/17 9371  . feeding supplement (ENSURE ENLIVE) (ENSURE ENLIVE) liquid 237 mL  237 mL Oral BID BM Annia Belt, MD   237 mL at 11/22/17 0833  . insulin aspart (novoLOG) injection 0-9 Units  0-9 Units Subcutaneous TID WC Jennelle Human B, NP   1 Units at 11/22/17 1316  . polyethylene glycol (MIRALAX / GLYCOLAX) packet 17 g  17 g Oral  Daily PRN Lorella Nimrod, MD      . sodium chloride flush (NS) 0.9 % injection 10-40 mL  10-40 mL Intracatheter Q12H Sampson Goon, MD   3 mL at 11/21/17 1039  . sodium chloride flush (NS) 0.9 % injection 10-40 mL  10-40 mL Intracatheter PRN Sampson Goon, MD   10 mL at 11/17/17 0442  . sodium chloride flush (NS) 0.9 % injection 3 mL  3 mL Intravenous Q12H Lorella Nimrod, MD   3 mL at 11/21/17 1039    OBJECTIVE: Elderly African-American woman examined in a wheelchair  There were no vitals filed for this visit.   There is no height or weight on file to calculate BMI.   Wt Readings from Last 3 Encounters:  11/14/17 205 lb 0.4 oz (93 kg)  10/30/17 205 lb (93 kg)  10/24/17 205 lb (93 kg)      ECOG FS:2 - Symptomatic, <50% confined to bed  LAB RESULTS:  CMP  Component Value Date/Time   NA 140 11/18/2017 0448   K 4.2 11/18/2017 0448   CL 110 11/18/2017 0448   CO2 22 11/18/2017 0448   GLUCOSE 123 (H) 11/18/2017 0448   BUN 14 11/18/2017 0448   CREATININE 0.92 11/18/2017 0448   CREATININE 0.90 09/12/2017 1132   CALCIUM 7.8 (L) 11/18/2017 0448   PROT 6.6 11/14/2017 0804   ALBUMIN 1.9 (L) 11/17/2017 0435   AST 31 11/14/2017 0804   AST 17 09/12/2017 1132   ALT 29 11/14/2017 0804   ALT 26 09/12/2017 1132   ALKPHOS 75 11/14/2017 0804   BILITOT 0.8 11/14/2017 0804   BILITOT 0.5 09/12/2017 1132   GFRNONAA 59 (L) 11/18/2017 0448   GFRNONAA >60 09/12/2017 1132   GFRAA >60 11/18/2017 0448   GFRAA >60 09/12/2017 1132    No results found for: TOTALPROTELP, ALBUMINELP, A1GS, A2GS, BETS, BETA2SER, GAMS, MSPIKE, SPEI  No results found for: KPAFRELGTCHN, LAMBDASER, KAPLAMBRATIO  Lab Results  Component Value Date   WBC 10.5 11/18/2017   NEUTROABS 18.7 (H) 11/15/2017   HGB 10.3 (L) 11/18/2017   HCT 33.8 (L) 11/18/2017   MCV 96.6 11/18/2017   PLT 252 11/18/2017    _0 @  No results found for: LABCA2  No components found for: TJQZES923  Recent Labs  Lab  11/16/17 0500  INR 1.17    No results found for: LABCA2  No results found for: RAQ762  No results found for: UQJ335  No results found for: KTG256  No results found for: CA2729  No components found for: HGQUANT  No results found for: CEA1 / No results found for: CEA1   No results found for: AFPTUMOR  No results found for: CHROMOGRNA  No results found for: PSA1  No results displayed because visit has over 200 results.      (this displays the last labs from the last 3 days)  No results found for: TOTALPROTELP, ALBUMINELP, A1GS, A2GS, BETS, BETA2SER, GAMS, MSPIKE, SPEI (this displays SPEP labs)  No results found for: KPAFRELGTCHN, LAMBDASER, KAPLAMBRATIO (kappa/lambda light chains)  No results found for: HGBA, HGBA2QUANT, HGBFQUANT, HGBSQUAN (Hemoglobinopathy evaluation)   No results found for: LDH  No results found for: IRON, TIBC, IRONPCTSAT (Iron and TIBC)  No results found for: FERRITIN  Urinalysis    Component Value Date/Time   COLORURINE AMBER (A) 11/14/2017 1155   APPEARANCEUR CLOUDY (A) 11/14/2017 1155   LABSPEC 1.023 11/14/2017 1155   PHURINE 5.0 11/14/2017 1155   GLUCOSEU NEGATIVE 11/14/2017 1155   HGBUR SMALL (A) 11/14/2017 1155   BILIRUBINUR NEGATIVE 11/14/2017 1155   KETONESUR NEGATIVE 11/14/2017 1155   PROTEINUR 100 (A) 11/14/2017 1155   NITRITE NEGATIVE 11/14/2017 1155   LEUKOCYTESUR SMALL (A) 11/14/2017 1155     STUDIES: Dg Chest Port 1 View  Result Date: 11/14/2017 CLINICAL DATA:  Bedside central venous catheter placement. EXAM: PORTABLE CHEST 1 VIEW 2:26 p.m.: COMPARISON:  Portable chest x-ray earlier same day 8:18 a.m. FINDINGS: RIGHT jugular central venous catheter tip projects over the expected location of the LOWER SVC. No evidence of pneumothorax or mediastinal hematoma. Suboptimal inspiration accounts for mild LEFT basilar atelectasis. Lungs otherwise clear. IMPRESSION: 1. RIGHT jugular central venous catheter tip projects over the  expected location of the LOWER SVC. No complicating features. 2. Suboptimal inspiration which accounts for mild LEFT basilar atelectasis. No acute cardiopulmonary disease otherwise. Electronically Signed   By: Evangeline Dakin M.D.   On: 11/14/2017 14:55   Dg Chest Port 1 View  Result Date:  11/14/2017 CLINICAL DATA:  Sepsis, fever. EXAM: PORTABLE CHEST 1 VIEW COMPARISON:  None. FINDINGS: The heart size and mediastinal contours are within normal limits. Both lungs are clear. The visualized skeletal structures are unremarkable. IMPRESSION: No active disease. Electronically Signed   By: Marijo Conception, M.D.   On: 11/14/2017 08:47    ELIGIBLE FOR AVAILABLE RESEARCH PROTOCOL: no  ASSESSMENT: 77 [facility]. [facility] resident status post left breast upper outer quadrant biopsy 09/05/2017 for a clinical T2N0, stage Ib invasive ductal carcinoma, grade 2, estrogen and progesterone receptor positive, HER-2 not amplified, with an MIB-1 of 15%.  (1) status post left mastectomy 10/30/2017, for a pT4 pN1, stage III invasive ductal carcinoma, grade 2, with lymphovascular invasion and involvement of the nipple and epidermis of the areola, but with negative margins  (a) 1 of 2 non-sentinel lymph nodes removed positive straight capsular extension  PLAN: No-show on 11/23/2017; will reschedule for mid November  Given that she is at very high risk for systemic disease we are going to start her on anastrozole now.  Magrinat, Virgie Dad, MD  11/22/17 2:59 PM Medical Oncology and Hematology [facility] [address] [place], [place] [address] Tel. [phone]    Fax. [phone]  Alice Rieger, am acting as scribe for Chauncey Cruel MD.  I, Lurline Del MD, have reviewed the above documentation for accuracy and completeness, and I agree with the above.

## 2017-11-23 ENCOUNTER — Telehealth: Payer: Self-pay | Admitting: Oncology

## 2017-11-23 ENCOUNTER — Inpatient Hospital Stay: Payer: Medicare Other | Attending: Oncology | Admitting: Oncology

## 2017-11-23 LAB — GLUCOSE, CAPILLARY
Glucose-Capillary: 97 mg/dL (ref 70–99)
Glucose-Capillary: 85 mg/dL (ref 70–99)

## 2017-11-23 MED ORDER — ANASTROZOLE 1 MG PO TABS: 1.0000 mg | ORAL_TABLET | Freq: Every day | ORAL | 4 refills | Status: AC

## 2017-11-23 MED ORDER — CEPHALEXIN 500 MG PO CAPS: 500.0000 mg | ORAL_CAPSULE | Freq: Once | ORAL | 0 refills | Status: AC

## 2017-11-23 NOTE — Progress Notes (Signed)
Nutrition Follow-up  DOCUMENTATION CODES:   Not applicable  INTERVENTION:   Continue Ensure Enlive BID - each provides 350 kcal, 20 g protein Continue Magic Cup TID - each provides 290 kcal, 9 g protein  NUTRITION DIAGNOSIS:   Predicted suboptimal nutrient intake related to acute illness(sepsis d/t UTI; s/p left mastectomy x3 weeks) as evidenced by estimated needs(limited food acceptance insufficent to meet increased needs).   GOAL:   Patient will meet greater than or equal to 90% of their needs  Pt completing 50-100% of meals and snacks.  MONITOR:   PO intake, Supplement acceptance  PO intake: 50-100% per nsg Supps: well accepted  ASSESSMENT:   77yo SNF resident w/ recent left mastectomy on 9/24 w/ sepsis secondary to UTI. Pt w/ limited motor skills after suffering stroke in 1996 and chronic behavioral disturbances d/t bipolar disorder. PMH: T2DM, CVA, Schizoaffective disorder, stage2 Breast Ca, CHF, HLD, HTN  Labs reviewed - no updates since 10/13 Meds reviewed - colace, pepcid, Ensure Enlive BID  Pt resting at time of visit. AMS and unable to answer most questions. Eventually agreed to NFPE - no significant findings. Asked for grape juice and graham crackers.  Per nsg, pt did not eat breakfast today, but has been drinking 100% of Ensure.    NUTRITION - FOCUSED PHYSICAL EXAM:    Most Recent Value  Orbital Region  No depletion  Upper Arm Region  No depletion  Thoracic and Lumbar Region  No depletion  Buccal Region  No depletion  Temple Region  No depletion  Clavicle Bone Region  No depletion  Clavicle and Acromion Bone Region  No depletion  Scapular Bone Region  No depletion  Dorsal Hand  No depletion  Patellar Region  No depletion  Anterior Thigh Region  No depletion  Posterior Calf Region  No depletion  Edema (RD Assessment)  Mild  Hair  Reviewed  Eyes  Reviewed  Mouth  Reviewed  Skin  Reviewed  Nails  Reviewed       Diet Order:  50-100% intake,  per nsg Diet Order            Diet heart healthy/carb modified Room service appropriate? Yes; Fluid consistency: Thin  Diet effective now              EDUCATION NEEDS:   No education needs have been identified at this time  Skin:  Skin Assessment: Reviewed RN Assessment(incision; left breast)  Last BM:  10/17 - type 6 and type 5  Height:   Ht Readings from Last 1 Encounters:  11/14/17 5\' 10"  (1.778 m)    Weight:   Wt Readings from Last 1 Encounters:  11/14/17 93 kg    Ideal Body Weight:  68.2 kg  BMI:  Body mass index is 29.42 kg/m.  Estimated Nutritional Needs:   Kcal:  7121-9758  Protein:  110-121g  Fluid:  2L  Althea Grimmer, MS, RDN, LDN On-call pager: [phone]

## 2017-11-23 NOTE — Progress Notes (Signed)
CSW was informed by Kem Kays that  there were some back and forth with the insurance agency([employer]) regarding if the patient needed  pre approval or not for SNF. CSW was advised  patient's insurance has been resolved ann no authorization was needed. CSW was advised patient can be transported to the facility.  Thurmond Butts, [place] Social Worker [phone]

## 2017-11-23 NOTE — Telephone Encounter (Signed)
Appts scheduled letter/calendar mailed per 10/18 sch msg

## 2017-11-23 NOTE — Discharge Instructions (Signed)
Carolyn Parrish,   It has been a pleasure working with you and we are glad you're feeling better. You were hospitalized for a severe urinary tract infection, you spent some time in the intensive care unit.    For your UTI,  You needed a 5 day course of Keflex 500 mg twice a day, and have completed 4.5 days in the hospital. You need to complete the last dose later today.    Your blood pressure was low/normal during your admission and we held your blood pressure medications.  STOP taking your lisinopril for now Follow up with your primary care doctor to see if you need to restart this  Follow up with your primary care provider in 1-2 weeks  If your symptoms worsen or you develop new symptoms, please seek medical help whether it is your primary care provider or emergency department.  If you have any questions about this hospitalization please call [phone].

## 2017-11-23 NOTE — Progress Notes (Signed)
Internal Medicine Attending:   I saw and examined the patient. I reviewed the resident's note and I agree with the resident's findings and plan as documented in the resident's note.  Patient feels well today with no new complaints.  Patient was initially admitted to the hospital with sepsis likely secondary to UTI.  She initially required pressor support in the ICU but her blood pressures have since stabilized.  Patient will complete a 10-day course of antibiotics today.  No further work-up at this time.  Blood cultures have been negative to date.  Patient stable for discharge to SNF today.

## 2017-11-23 NOTE — Progress Notes (Signed)
Nsg Discharge Note  Admit Date:  11/14/2017 Discharge date: 11/23/2017   Carolyn Parrish to be D/C'd Skilled nursing facility per MD order.  AVS completed.  Copy for chart, and copy for patient signed, and dated. Patient/caregiver able to verbalize understanding.  Discharge Medication: Allergies as of 11/23/2017      Reactions   Atorvastatin    UNSPECIFIED REACTION       Medication List    STOP taking these medications   doxycycline 100 MG capsule Commonly known as:  VIBRAMYCIN   lisinopril 2.5 MG tablet Commonly known as:  PRINIVIL,ZESTRIL   ROCEPHIN 1 g injection Generic drug:  cefTRIAXone   sodium chloride 0.9 % infusion     TAKE these medications   acetaminophen 325 MG tablet Commonly known as:  TYLENOL Take 325-650 mg by mouth every 4 (four) hours as needed (650mg  for arthritis pain, 325mg  for fever >101).   anastrozole 1 MG tablet Commonly known as:  ARIMIDEX Take 1 tablet (1 mg total) by mouth daily.   aspirin 81 MG chewable tablet Chew 81 mg by mouth daily.   camphor-menthol lotion Commonly known as:  SARNA Apply 1 application topically 2 (two) times daily as needed for itching.   cephALEXin 500 MG capsule Commonly known as:  KEFLEX Take 1 capsule (500 mg total) by mouth once for 1 dose.   divalproex 250 MG 24 hr tablet Commonly known as:  DEPAKOTE ER Take 250 mg by mouth daily.   docusate sodium 100 MG capsule Commonly known as:  COLACE Take 100 mg by mouth 2 (two) times daily.   magnesium hydroxide 400 MG/5ML suspension Commonly known as:  MILK OF MAGNESIA Take 30 mLs by mouth daily as needed for mild constipation.   metFORMIN 500 MG tablet Commonly known as:  GLUCOPHAGE Take 500 mg by mouth daily.   MYLANTA 200-200-20 MG/5ML suspension Generic drug:  alum & mag hydroxide-simeth Take 30 mLs by mouth every 4 (four) hours as needed for indigestion or heartburn.   oxyCODONE 5 MG immediate release tablet Commonly known as:  Oxy  IR/ROXICODONE Take 1 tablet (5 mg total) by mouth every 4 (four) hours as needed for moderate pain.   polyethylene glycol packet Commonly known as:  MIRALAX / GLYCOLAX Take 17 g by mouth daily.   PROBIOTIC DAILY PO Take 1 capsule by mouth daily. x7 days   vitamin B-12 500 MCG tablet Commonly known as:  CYANOCOBALAMIN Take 500 mcg by mouth daily.       Discharge Assessment: Vitals:   11/23/17 0634 11/23/17 1445  BP: (!) 119/52 (!) 128/54  Pulse: 67 68  Resp: 17 20  Temp: 98.8 F (37.1 C) 98.9 F (37.2 C)  SpO2: 98% 97%   Skin clean, dry and intact without evidence of skin break down, no evidence of skin tears noted. IV catheter discontinued intact. Site without signs and symptoms of complications - no redness or edema noted at insertion site, patient denies c/o pain - only slight tenderness at site.  Dressing with slight pressure applied.  D/c Instructions-Education: Discharge instructions given to patient/family with verbalized understanding. D/c education completed with patient/family including follow up instructions, medication list, d/c activities limitations if indicated, with other d/c instructions as indicated by MD - patient able to verbalize understanding, all questions fully answered. Patient instructed to return to ED, call 911, or call MD for any changes in condition.  Patient escorted to [facility] via [facility] [place] N Paylin Hailu, RN 11/23/2017 4:31 PM

## 2017-11-23 NOTE — Progress Notes (Signed)
   Subjective: Ms. Costanza was doing well today, no acute events overnight.  She denied any new concerns or issues.  Was wanting to rest when we were there.  Discussed the plan for today and she is in agreement.  Objective:  Vital signs in last 24 hours: Vitals:   11/21/17 2215 11/22/17 0605 11/22/17 1411 11/22/17 2303  BP:  (!) 120/53 132/63 (!) 122/57  Pulse:  78 81 70  Resp:  17 18 18   Temp:  99 F (37.2 C) 98.2 F (36.8 C) 98.8 F (37.1 C)  TempSrc:  Oral Oral Oral  SpO2: 99% 96% 100% 100%  Weight:      Height:        General: Resting comfortably, no acute distress  Cardiac: RRR, normal S1, S2, no murmurs, rubs or gallops  Pulmonary: Lungs CTA bilaterally, no wheezing, rhonchi or rales  Abdomen: Soft, non-tender, +bowel sounds, no guarding or masses noted    Assessment/Plan:  Active Problems:   Encounter for central line placement   Sepsis ([place])   Septic shock (HCC)   AKI (acute kidney injury) ([place])   Diabetes mellitus treated with oral medication ([place])   Lactic acidosis   Bipolar disorder ([place])   History of completed stroke   Dehydration  This is a 77 year old female with history of bipolar disorder, hypertension, diabetes, 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 Admitted 10/9 for suspected septic shock with a fever to 102.7, leukocytosis of 35, lactate of 2.94, and hypotension after 4 L of IV fluids. She was sent to the ICU for pressor support for hypotension. Blood cultures and urine cultures have all been negative however she did receive a dose of antibiotics in the SNF. Suspected source of a UTI with urosepsis. Transferred back to the medicine service on 10/11.   Urosepsis: Initially presented with sepsis and has completed a 5-day course of IV antibiotics, today's her last day of Keflex 500 mg BID, she has received her morning dose here. No fevers over night, no acute events overnight.  No further work-up at this  time.  Remains stable for discharge to SNF. -PT recommends SNF placement -Discharge with last dose of Keflex at SNF  Breast cancer status mastectomy on 9/24: -JP drains in place.  -Outpatient follow-up with Dr. Brantley Stage for drain removal.  Hypertension: Patient is on home lisinopril and remains normotensive.  Dispo: Anticipated discharge is today to SNF  Asencion Noble, MD 11/23/2017, 6:34 AM Pager: [phone]

## 2017-11-23 NOTE — Progress Notes (Signed)
Pt will DC to: [facility] date:11/24/2017 Family notified: [name], daughter Transport by: [facility]  RN, patient, and facility notified of DC. Discharge Summary sent to facility. RN given number for report [phone], room 2016. DC packet on chart. Ambulance transport requested for patient.   CSW signing off. Thurmond Butts, [place] Social Worker [phone]

## 2017-11-26 ENCOUNTER — Non-Acute Institutional Stay (SKILLED_NURSING_FACILITY): Payer: Medicare Other | Admitting: Internal Medicine

## 2017-11-26 ENCOUNTER — Encounter: Payer: Self-pay | Admitting: Adult Health

## 2017-11-26 ENCOUNTER — Telehealth: Payer: Self-pay | Admitting: Radiation Oncology

## 2017-11-26 DIAGNOSIS — F258 Other schizoaffective disorders: Secondary | ICD-10-CM

## 2017-11-26 DIAGNOSIS — A419 Sepsis, unspecified organism: Secondary | ICD-10-CM

## 2017-11-26 DIAGNOSIS — N179 Acute kidney failure, unspecified: Secondary | ICD-10-CM | POA: Diagnosis not present

## 2017-11-26 DIAGNOSIS — Z170 Estrogen receptor positive status [ER+]: Secondary | ICD-10-CM

## 2017-11-26 DIAGNOSIS — R6521 Severe sepsis with septic shock: Secondary | ICD-10-CM

## 2017-11-26 DIAGNOSIS — C50212 Malignant neoplasm of upper-inner quadrant of left female breast: Secondary | ICD-10-CM

## 2017-11-26 NOTE — Assessment & Plan Note (Signed)
Clinically resolved.  

## 2017-11-26 NOTE — Patient Instructions (Signed)
See assessment and plan under each diagnosis in the problem list and acutely for this visit 

## 2017-11-26 NOTE — Progress Notes (Signed)
NURSING HOME LOCATION:  [facility] ROOM NUMBER:  [id number]  CODE STATUS:  Full Code  PCP:  Garwin Brothers, MD  [place] [id number]  This is a comprehensive admission note to [facility] performed on this date less than 30 days from date of admission. Included are preadmission medical/surgical history; reconciled medication list; family history; social history and comprehensive review of systems.  Corrections and additions to the records were documented. Comprehensive physical exam was also performed. Additionally a clinical summary was entered for each active diagnosis pertinent to this admission in the Problem List to enhance continuity of care.  HPI: Patient was hospitalized 10/9-10/18/2019 with sepsis & AKI.  She presented with clinical septic shock with temperature up to 102.7, leukocytosis to 35,000, lactate of 2.94, and hypotension despite 4 L of IV fluid resuscitation.  Blood and urine cultures have been negative but the patient received a dose of ceftriaxone at the SNF prior to admission.  Source of infection was thought to be UTI or breast surgical site infection.The former was more likely as she has a history of frequent UTIs and no definite cellulitis or abscess was noted at the surgical site on exam.   The patient completed 10 days of antibiotics for the urosepsis.  This was associated with severe hypotension requiring vasopressors while in the ICU.  Lisinopril was held during admission and she remained normotensive. The patient was transferred from the ICU on 10/12 and blood pressures remained stable off the pressors. She was weaned from stress dose steroids.  Leukocytosis and lactic acidosis resolved.  Antibiotic regimen was narrowed from Vancomycin & Zosyn to ceftriaxone on 10/11.  After 5 days of IV antibiotic she was transitioned to p.o. Keflex for another 5 days, ending 10/18.  Creatinine on admission was 3.28, AKI was attributed to sepsis.  Pre-discharge  creatinine was 0.92.  The patient has breast cancer and underwent mastectomy 10/30/2017.  As noted there was no sign of infection at the surgical site.  JP drains revealed serosanguineous fluid without purulence. Dr. Brantley Stage followed her while hospitalized. She is on Arimidex.  Past medical and surgical history: Includes CVA, schizoaffective disorder, dyslipidemia, uncomplicated diabetes, and history of congestive heart failure.  She underwent simple mastectomy with axillary sentinel node biopsy 10/30/2017 and complete mastectomy the same day.  She also had cholecystectomy.  Social history: never smoker; presently non drinker.  Family history: Noncontributory due to age   Review of systems: Was not reliable; all responses were "no".  Dementia was suggested as she gave the date as "October, late, Monday, 20".  She then gave the year as "21".  When asked why she had been in the hospital, her reply was "clot left leg".  Constitutional: No fever, significant weight change, fatigue  Eyes: No redness, discharge, pain, vision change ENT/mouth: No nasal congestion, purulent discharge, earache, change in hearing, sore throat  Cardiovascular: No chest pain, palpitations, paroxysmal nocturnal dyspnea, claudication, edema  Respiratory: No cough, sputum production, hemoptysis, DOE, significant snoring, apnea Gastrointestinal: No heartburn, dysphagia, abdominal pain, nausea /vomiting, rectal bleeding, melena, change in bowels Genitourinary: No dysuria, hematuria, pyuria, incontinence, nocturia Musculoskeletal: No joint stiffness, joint swelling, weakness, pain Dermatologic: No rash, pruritus, change in appearance of skin Neurologic: No dizziness, headache, syncope, seizures, numbness, tingling Psychiatric: No significant anxiety, depression, insomnia, anorexia Endocrine: No change in hair/skin/nails, excessive thirst, excessive hunger, excessive urination  Hematologic/lymphatic: No significant bruising,  lymphadenopathy, abnormal bleeding Allergy/immunology: No itchy/watery eyes, significant sneezing,  urticaria, angioedema  Physical exam:  Pertinent or positive findings: She was sitting in her room staring blankly ahead.  The radio was on but volume was very low. TV was not on. Her speech is slurred and difficult to understand.  Before she replies she will open her eyes widely.  When she gave the year as "21" she exhibited somewhat of a pseudobulbar affect with crying.  L chest wound vac present.  Abdomen is protuberant.  She has 1/2+ pitting edema.  Varus deformity of the knees is present.  There is decreased strength generally but especially left upper extremity.  She has an intermittent tremor of the right hand.  General appearance: Adequately nourished; no acute distress, increased work of breathing is present.   Lymphatic: No lymphadenopathy about the head, neck, axilla. Eyes: No conjunctival inflammation or lid edema is present. There is no scleral icterus. Ears:  External ear exam shows no significant lesions or deformities.   Nose:  External nasal examination shows no deformity or inflammation. Nasal mucosa are pink and moist without lesions, exudates Oral exam: Lips and gums are healthy appearing.There is no oropharyngeal erythema or exudate. Neck:  No thyromegaly, masses, tenderness noted.    Heart:  Normal rate and regular rhythm. S1 and S2 normal without gallop, murmur, click, rub.  Lungs: Chest clear to auscultation without wheezes, rhonchi, rales, rubs. Abdomen: Bowel sounds are normal.  Abdomen is soft and nontender with no organomegaly, hernias, masses. GU: Deferred  Extremities:  No cyanosis, clubbing. Neurologic exam:  Balance, Rhomberg, finger to nose testing could not be completed due to clinical state Skin: Warm & dry w/o tenting. No significant lesions or rash.  See clinical summary under each active problem in the Problem List with associated updated therapeutic plan

## 2017-11-26 NOTE — Telephone Encounter (Signed)
LM for pt's daughter to call me so we can discuss conference review of her mother's case.

## 2017-11-26 NOTE — Assessment & Plan Note (Signed)
See 11/26/2017 pseudobulbar affect type picture noted; Psych NP F/U @ SNF

## 2017-11-26 NOTE — Assessment & Plan Note (Addendum)
Continue Arimidex Wound Vac monitor @ SNF by Wound care Nurse; F/U with Dr Brantley Stage as indicated

## 2017-11-26 NOTE — Assessment & Plan Note (Signed)
In the context of presumed urosepsis; resolved with fluid resuscitation

## 2017-11-27 ENCOUNTER — Encounter: Payer: Self-pay | Admitting: Internal Medicine

## 2017-11-27 ENCOUNTER — Non-Acute Institutional Stay: Payer: Medicare Other | Admitting: Primary Care

## 2017-11-27 DIAGNOSIS — Z8673 Personal history of transient ischemic attack (TIA), and cerebral infarction without residual deficits: Secondary | ICD-10-CM

## 2017-11-27 DIAGNOSIS — E860 Dehydration: Secondary | ICD-10-CM

## 2017-11-27 DIAGNOSIS — Z515 Encounter for palliative care: Secondary | ICD-10-CM

## 2017-11-27 DIAGNOSIS — C50912 Malignant neoplasm of unspecified site of left female breast: Secondary | ICD-10-CM

## 2017-11-27 NOTE — Assessment & Plan Note (Signed)
Clinically resolved.  

## 2017-11-27 NOTE — Assessment & Plan Note (Signed)
Pseudobulbar type presentation 11/26/17 Psych NP monitor @ SNF

## 2017-11-27 NOTE — Assessment & Plan Note (Addendum)
Wound Care @ SNF; F/U as per Dr Brantley Stage F/U as per Oncology Palliative Care monitor @ SNF

## 2017-11-27 NOTE — Patient Instructions (Signed)
See assessment & plan under each diagnosis in the problem list & acutely for this visit.

## 2017-11-27 NOTE — Progress Notes (Signed)
PALLIATIVE CARE CONSULT VISIT   PATIENT NAME: Carolyn Parrish DOB: February 23, 1940 MRN: [id number]  PRIMARY CARE PROVIDER:   Don Broach MD REFERRING PROVIDER:  Unice Cobble MD [facility] [place], [place]  RESPONSIBLE PARTY:   Daughter Carolyn Parrish [phone]  ASSESSMENT:     Patient admitted to palliative care for goals of care. Recent left mastectomy and daughter states her mother will begin radiation treatments next week.  Currently has surgical dressing over site and jp drains. Good appetite per her report. Daughter Carolyn Parrish was present briefly during the interview.    RECOMMENDATIONS and PLAN:  1. Goals of Care: Explore with patient and  family if they desire palliative treatment and their expectation for radiation disease progression.  Meet again to discuss DNR or MOST for choices. Return 1-2 months.   I spent 25 minutes providing this consultation,  From 1100  To 1125 . More than 50% of the time in this consultation was spent coordinating communication.   HISTORY OF PRESENT ILLNESS:  Carolyn Parrish is a 77 y.o. year old female with multiple medical problems including CVA, Bipolar disorder, schizoaffective disorder, Left breast cancer, recent mastectomy with jp drains. Palliative Care was asked to help address goals of care.   CODE STATUS: Full code, full scop PPS: 30% HOSPICE ELIGIBILITY/DIAGNOSIS: TBD  PAST MEDICAL HISTORY:  Past Medical History:  Diagnosis Date  . Cancer (HCC)    BREAST CANCER  . CHF (congestive heart failure) ([place])   . Diabetes mellitus without complication ([place])   . Hyperlipidemia   . Hypertension   . Schizoaffective disorder ([place])   . Stroke [facility])     SOCIAL HX:  Social History   Tobacco Use  . Smoking status: Never Smoker  . Smokeless tobacco: Never Used  Substance Use Topics  . Alcohol use: Not Currently    ALLERGIES:  Allergies  Allergen Reactions  . Atorvastatin     UNSPECIFIED REACTION        PERTINENT MEDICATIONS:  Outpatient Encounter Medications as of 11/27/2017  Medication Sig  . acetaminophen (TYLENOL) 325 MG tablet Take 325-650 mg by mouth every 4 (four) hours as needed (650mg  for arthritis pain, 325mg  for fever >101).   Marland Kitchen alum & mag hydroxide-simeth (MYLANTA) 200-200-20 MG/5ML suspension Take 30 mLs by mouth every 4 (four) hours as needed for indigestion or heartburn.  . anastrozole (ARIMIDEX) 1 MG tablet Take 1 tablet (1 mg total) by mouth daily.  Marland Kitchen aspirin 81 MG chewable tablet Chew 81 mg by mouth daily.  . camphor-menthol (SARNA) lotion Apply 1 application topically 2 (two) times daily as needed for itching.  . divalproex (DEPAKOTE ER) 250 MG 24 hr tablet Take 250 mg by mouth daily.  Marland Kitchen docusate sodium (COLACE) 100 MG capsule Take 100 mg by mouth 2 (two) times daily.  . magnesium hydroxide (MILK OF MAGNESIA) 400 MG/5ML suspension Take 30 mLs by mouth daily as needed for mild constipation.  . metFORMIN (GLUCOPHAGE) 500 MG tablet Take 500 mg by mouth daily.  Marland Kitchen oxyCODONE (OXY IR/ROXICODONE) 5 MG immediate release tablet Take 1 tablet (5 mg total) by mouth every 4 (four) hours as needed for moderate pain.  . polyethylene glycol (MIRALAX / GLYCOLAX) packet Take 17 g by mouth daily.  . Probiotic Product (PROBIOTIC DAILY PO) Take 1 capsule by mouth daily. x7 days  . vitamin B-12 (CYANOCOBALAMIN) 500 MCG tablet Take 500 mcg by mouth daily.   No facility-administered encounter medications on file as of  11/27/2017.     PHYSICAL EXAM:  V132/68 97.9-73-18 Weight 208 General: NAD, frail appearing,  Obese, Cardiovascular: S1 s2, regular rate and rhythm, No edema,  Pulmonary: clear all fields, no cough,   Abdomen: soft, nontender, + bowel sounds Endorses good appetite but also constipation. Extremities: no edema, no joint deformities Skin: no rashes or lesions noted. Neurological: Weakness, Can answer simple sentences but does not initiate conversation. Anxious affect.  Estevan Oaks, NP

## 2017-11-27 NOTE — Progress Notes (Signed)
NURSING HOME LOCATION:  [facility] ROOM NUMBER:  [id number]  CODE STATUS:  Full Code  PCP:  Garwin Brothers, MD [place] [id number]  This is a comprehensive admission note to [facility] performed on this date less than 30 days from date of admission. Included are preadmission medical/surgical history; reconciled medication list; family history; social history and comprehensive review of systems.  Corrections and additions to the records were documented. Comprehensive physical exam was also performed. Additionally a clinical summary was entered for each active diagnosis pertinent to this admission in the Problem List to enhance continuity of care.  HPI:  The patient was hospitalized 10/9-10/18/2019 with sepsis & AKI.  She presented with clinical septic shock with temperature up to 102.7, leukocytosis to 35,000, lactate of 2.94, and hypotension despite 4L of IV fluid resuscitation.  Blood and urine cultures have been negative but the patient received a dose of ceftriaxone at the SNF prior to admission.  Source of infection was thought to be UTI or breast surgical site infection.  The former was more likely as she has a history of frequent UTIs and no definite cellulitis or abscess was noted at the surgical site on exam.   The patient completed 10 days of antibiotics for urosepsis.  This was associated with severe hypotension requiring vasopressors while in the ICU.  Lisinopril was held during admission and she remained normotensive.  The patient was transferred from the ICU on 10/12 and blood pressures remained stable off the pressors.  She was weaned from stress dose steroids. Leukocytosis and lactic acidosis resolved.  Antibiotic regimen was narrowed from Vancomycin and Zosyn to ceftriaxone on 10/11.  After 5 days of IV antibiotic she was transitioned to p.o. Keflex for another 5 days, ending 10/18.  Creatinine on admission was 3.28, AKI was attributed to sepsis.   Pre-discharge creatine was 0.92. The patient has breast cancer and underwent mastectomy 10/30/17.  As noted, there was no sign of infection at the surgical site. Surgical drain revealed serosanguineous fluid without purulence.  Dr. Brantley Stage followed her while hospitalized.  She is on Arimidex.  Past medical and surgical history:  Includes CVA, schizoaffective disorder, dyslipidemia, uncomplicated diabetes, and history of congestive heart failure.  She underwent simple mastectomy with axillary sentinel node biopsy 10/30/17 and complete mastectomy the same day.  She also had cholecystectomy.  Social history:  Never smoker; presently nondrinker.   Family history:  Noncontributory due to age.   Review of systems:  Was not reliable; all responses were "no."  Dementia was suggested as she gave the date as "October, late, Monday, 20." She then gave the year as "21." When asked why she had been in the hospital, her reply was "clot left leg."  Constitutional: No fever, significant weight change, fatigue  Eyes: No redness, discharge, pain, vision change ENT/mouth: No nasal congestion, purulent discharge, earache, change in hearing, sore throat  Cardiovascular: No chest pain, palpitations, paroxysmal nocturnal dyspnea, claudication, edema  Respiratory: No cough, sputum production, hemoptysis, DOE, significant snoring, apnea  Gastrointestinal: No heartburn, dysphagia, abdominal pain, nausea /vomiting, rectal bleeding, melena, change in bowels Genitourinary: No dysuria, hematuria, pyuria, incontinence, nocturia Musculoskeletal: No joint stiffness, joint swelling, weakness, pain Dermatologic: No rash, pruritus, change in appearance of skin Neurologic: No dizziness, headache, syncope, seizures, numbness, tingling Psychiatric: No significant anxiety, depression, insomnia, anorexia Endocrine: No change in hair/skin/nails, excessive thirst, excessive hunger, excessive urination  Hematologic/lymphatic: No  significant bruising, lymphadenopathy, abnormal bleeding Allergy/immunology:  No itchy/watery eyes, significant sneezing, urticaria, angioedema  Physical exam:  Pertinent or positive findings:  She was sitting in her room staring blankly ahead. The radio was on but volume was essentially inaudible. TV was not on. Her speech is slurred and difficult to understand. Before she replies she will open her eyes widely. When she gave the year as "21" she exhibited somewhat of a pseudobulbar affect with crying. L chest wound drain present.  Abdomen is protuberant.  She has 1/2+ pitting edema. Varus deformity of the knees is present.  There is decreased strength generally but especially left upper extremity. She has an intermittent tremor of the right hand.  General appearance: no acute distress, increased work of breathing is present.   Lymphatic: No lymphadenopathy about the head, neck, axilla. Eyes: No conjunctival inflammation or lid edema is present. There is no scleral icterus. Ears:  External ear exam shows no significant lesions or deformities.   Nose:  External nasal examination shows no deformity or inflammation. Nasal mucosa are pink and moist without lesions, exudates Oral exam: Lips and gums are healthy appearing.There is no oropharyngeal erythema or exudate. Neck:  No thyromegaly, masses, tenderness noted.    Heart:  Normal rate and regular rhythm. S1 and S2 normal without gallop, murmur, click, rub.  Lungs: Chest clear to auscultation without wheezes, rhonchi, rales, rubs. Abdomen: Bowel sounds are normal.  Abdomen is soft and nontender with no organomegaly, hernias, masses. GU: Deferred  Extremities:  No cyanosis, clubbing. Neurologic exam: Balance, Rhomberg, finger to nose testing could not be completed due to clinical state Skin: Warm & dry w/o tenting. No significant rash.  See clinical summary under each active problem in the Problem List with associated updated therapeutic  plan.

## 2017-11-27 NOTE — Assessment & Plan Note (Signed)
AKI resolved; creatinine 0.92

## 2017-11-28 NOTE — Progress Notes (Signed)
This encounter was created in error - please disregard.

## 2017-11-30 LAB — CBC AND DIFFERENTIAL
HEMATOCRIT: 39 (ref 36–46)
HEMOGLOBIN: 13.1 (ref 12.0–16.0)
NEUTROS ABS: 5
PLATELETS: 360 (ref 150–399)
WBC: 9.3

## 2017-12-03 ENCOUNTER — Telehealth: Payer: Self-pay | Admitting: Radiation Oncology

## 2017-12-03 NOTE — Telephone Encounter (Signed)
-----   Message from Kerri Perches sent at 12/03/2017 11:00 AM EDT ----- Regarding: PHONE CALL Hi Bryson Ha,   This patient's daughter, Kennith Gain is wanting you to call her.  Ver[place]a's phone number is [phone], she is wanting to set up her mom's radiation.  Thanks,  [employer]

## 2017-12-03 NOTE — Telephone Encounter (Signed)
I spoke with the patient's daughter and she and her mother and family have agreed to meeting back to review options for radiation and to proceed. Her JP drain removal was postponed due to her hospitalization, and has not come out. She's not scheduled to have them removed until 11/13. Her daughter states there is no output. I will touch base with Dr. Brantley Stage to see if she can be seen sooner. Once we know her drains are out, we can schedule follow up consult and simulation.

## 2017-12-18 ENCOUNTER — Non-Acute Institutional Stay: Payer: Medicare Other | Admitting: Primary Care

## 2017-12-18 DIAGNOSIS — Z7984 Long term (current) use of oral hypoglycemic drugs: Secondary | ICD-10-CM

## 2017-12-18 DIAGNOSIS — E119 Type 2 diabetes mellitus without complications: Secondary | ICD-10-CM

## 2017-12-18 DIAGNOSIS — Z515 Encounter for palliative care: Secondary | ICD-10-CM

## 2017-12-18 DIAGNOSIS — C50912 Malignant neoplasm of unspecified site of left female breast: Secondary | ICD-10-CM

## 2017-12-18 DIAGNOSIS — Z8673 Personal history of transient ischemic attack (TIA), and cerebral infarction without residual deficits: Secondary | ICD-10-CM

## 2017-12-18 NOTE — Progress Notes (Signed)
[facility] Telephone: [phone] Fax: [phone]  PATIENT NAME: Carolyn Parrish DOB: 1940/03/06 MRN: [id number]  PRIMARY CARE PROVIDER:   Unice Cobble MD  REFERRING PROVIDER:  Unice Cobble MD  RESPONSIBLE PARTY:     Ravan, Schlemmer Daughter   [phone]     ASSESSMENT  And RECOMMENDATIONS:   1. Comfort: States she is comfortable but that she  has not been up. Denies any needs. Staff does not report any discomfort or issues.  2. Goals of Care: Patient remains full code in anticipation of radiation therapy. Note that this would be set up as soon as drains from breast surgery are removed, scheduled for tomorrow.  Will continue to follow for goals of care clarification with family.  I spent 15  minutes providing this consultation,  from 13:15  to 13:30. More than 50% of the time in this consultation was spent coordinating communication.   HISTORY OF PRESENT ILLNESS:  Carolyn Parrish is a 77 y.o. year old female with multiple medical problems including breast cancer, CAD, DM2, schizophrenia. Palliative Care was asked to help address goals of care.   CODE STATUS: FULL CODE  PPS: 30% HOSPICE ELIGIBILITY/DIAGNOSIS: TBD  PAST MEDICAL HISTORY:  Past Medical History:  Diagnosis Date  . Cancer (HCC)    BREAST CANCER  . CHF (congestive heart failure) ([place])   . Diabetes mellitus without complication ([place])   . Hyperlipidemia   . Hypertension   . Schizoaffective disorder ([place])   . Stroke [facility])     SOCIAL HX:  Social History   Tobacco Use  . Smoking status: Never Smoker  . Smokeless tobacco: Never Used  Substance Use Topics  . Alcohol use: Not Currently    ALLERGIES:  Allergies  Allergen Reactions  . Atorvastatin     UNSPECIFIED REACTION      PERTINENT MEDICATIONS:  Outpatient Encounter Medications as of 12/18/2017  Medication Sig  . acetaminophen (TYLENOL) 325 MG tablet Take 325-650 mg by mouth every 4 (four) hours as needed (650mg   for arthritis pain, 325mg  for fever >101).   Marland Kitchen alum & mag hydroxide-simeth (MYLANTA) 200-200-20 MG/5ML suspension Take 30 mLs by mouth every 4 (four) hours as needed for indigestion or heartburn.  . anastrozole (ARIMIDEX) 1 MG tablet Take 1 tablet (1 mg total) by mouth daily.  Marland Kitchen aspirin 81 MG chewable tablet Chew 81 mg by mouth daily.  . camphor-menthol (SARNA) lotion Apply 1 application topically 2 (two) times daily as needed for itching.  . divalproex (DEPAKOTE ER) 250 MG 24 hr tablet Take 250 mg by mouth daily.  Marland Kitchen docusate sodium (COLACE) 100 MG capsule Take 100 mg by mouth 2 (two) times daily.  . magnesium hydroxide (MILK OF MAGNESIA) 400 MG/5ML suspension Take 30 mLs by mouth daily as needed for mild constipation.  . metFORMIN (GLUCOPHAGE) 500 MG tablet Take 500 mg by mouth daily.  Marland Kitchen oxyCODONE (OXY IR/ROXICODONE) 5 MG immediate release tablet Take 1 tablet (5 mg total) by mouth every 4 (four) hours as needed for moderate pain.  . polyethylene glycol (MIRALAX / GLYCOLAX) packet Take 17 g by mouth daily.  . Probiotic Product (PROBIOTIC DAILY PO) Take 1 capsule by mouth daily. x7 days  . vitamin B-12 (CYANOCOBALAMIN) 500 MCG tablet Take 500 mcg by mouth daily.   No facility-administered encounter medications on file as of 12/18/2017.     PHYSICAL EXAM:  VS 98.2-78-18 139/78,  95% RA General: NAD, frail appearing, obese Cardiovascular: regular rate and rhythm, S1S2  Pulmonary: clear all fields Abdomen: soft, nontender, + bowel sounds, denies constipation Extremities: no edema, no joint deformities Skin: no rashes, owunds on gross exam Neurological: Weakness, dysarthrias, interactive and appropriate, oriented to person and place.   Cyndia Skeeters DNP AGPCNP-BC

## 2017-12-20 ENCOUNTER — Inpatient Hospital Stay: Payer: Medicare Other | Admitting: Adult Health

## 2017-12-20 ENCOUNTER — Inpatient Hospital Stay: Payer: Medicare Other | Attending: Oncology

## 2017-12-20 ENCOUNTER — Telehealth: Payer: Self-pay

## 2017-12-20 DIAGNOSIS — Z79811 Long term (current) use of aromatase inhibitors: Secondary | ICD-10-CM | POA: Insufficient documentation

## 2017-12-20 DIAGNOSIS — Z170 Estrogen receptor positive status [ER+]: Secondary | ICD-10-CM | POA: Insufficient documentation

## 2017-12-20 DIAGNOSIS — Z8673 Personal history of transient ischemic attack (TIA), and cerebral infarction without residual deficits: Secondary | ICD-10-CM | POA: Insufficient documentation

## 2017-12-20 DIAGNOSIS — E119 Type 2 diabetes mellitus without complications: Secondary | ICD-10-CM | POA: Insufficient documentation

## 2017-12-20 DIAGNOSIS — I110 Hypertensive heart disease with heart failure: Secondary | ICD-10-CM | POA: Insufficient documentation

## 2017-12-20 DIAGNOSIS — Z9012 Acquired absence of left breast and nipple: Secondary | ICD-10-CM | POA: Insufficient documentation

## 2017-12-20 DIAGNOSIS — C50112 Malignant neoplasm of central portion of left female breast: Secondary | ICD-10-CM | POA: Insufficient documentation

## 2017-12-20 DIAGNOSIS — F259 Schizoaffective disorder, unspecified: Secondary | ICD-10-CM | POA: Insufficient documentation

## 2017-12-20 DIAGNOSIS — Z79899 Other long term (current) drug therapy: Secondary | ICD-10-CM | POA: Insufficient documentation

## 2017-12-20 NOTE — Telephone Encounter (Signed)
Spoke with patient's daughter regarding missed appt today.  Daughter stated that it was an over sight and that patient had many appt's scheduled around same time.  Per daughter, patient has not started anastrozole that she knows of.  Appt's for lab/provider need to be rescheduled before patient starts. Will send message to scheduling to call daughter for reschedule.

## 2017-12-20 NOTE — Telephone Encounter (Signed)
Patient was prescribed Anastrozole in October by Dr. Jana Hakim for her to go ahead and start per his note.  If she doesn't get in next week with [place], she should go ahead and start taking, since her radiation has been delayed.

## 2017-12-21 ENCOUNTER — Telehealth: Payer: Self-pay | Admitting: Adult Health

## 2017-12-21 ENCOUNTER — Telehealth: Payer: Self-pay

## 2017-12-21 NOTE — Telephone Encounter (Signed)
Patient's daughter called back to let us know that patient is taking anastrozole (started on  12/20/17) and that patient has appt on 12/31/17 with NP.

## 2017-12-21 NOTE — Progress Notes (Signed)
Location of Breast Cancer:Malignant neoplasm of upper-inner quadrant of left breast in female, estrogen receptor positive   Histology per Pathology Report:   FINAL DIAGNOSIS Diagnosis 09-05-17 Breast, left, needle core biopsy, 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 - SEE COMMENT. Microscopic Comment The carcinoma appears grade II. A breast prognostic profile will be performed and the results reported separately. The results were called to the [facility] on 09/06/17. (JBK:kh 09/06/17) Enid Cutter MD Pathologist, Electronic Signature  Receptor Status: ER(100 % +), PR (80 % +), Her2-neu (-), Ki-(15 %)  Did patient present with symptoms (if so, please note symptoms) or was this found on screening mammography?: The patient was noted to have a palpable mass in the left breast and has a history of prior CVA and has been in an SNF since 1996 as a result of CVA history.  Past/Anticipated interventions by surgeon, if any:  Diagnosis 10-30-17 Dr. 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 - DUCTAL CARCINOMA IN SITU, INTERMEDIATE GRADE - CARCINOMA INVOLVES THE DERMIS AND EPIDERMIS OF THE NIPPLE/AREOLA. - LYMPHOVASCULAR INVASION IS IDENTIFIED. - METASTATIC CARCINOMA IN 1 OF 2 LYMPH NODES (1/2) WITH EXTRACAPSULAR EXTENSION. - THE SURGICAL RESECTION MARGINS ARE NEGATIVE FOR CARCINOMA. - SEE ONCOLOGY TABLE BELOW. Microscopic Comment INVASIVE CARCINOMA OF THE BREAST: Resection Procedure: Simple mastectomy Specimen Laterality: Left  Receptor Status: ER(100 % +), PR (80 % +), Her2-neu (-), Ki-(15 %)   Past/Anticipated interventions by medical oncology, if any:Dr. Magrinat   Chemotherapy No  09-12-17 Given that she is at very high risk for systemic disease we are going to start her on Given that she is at very high risk for systemic disease we are going to start  her on anastrozole now.  Proceed from surgery to adjuvant antiestrogens.  Radiation referral  Lymphedema issues, if any:    None ROM to left arm Skin to left breast Follow up visit to see Dr. Marcello Moores Cornett  Pain issues, if any:  None  SAFETY ISSUES:  Prior radiation?: No  Pacemaker/ICD? :No  Possible current pregnancy?: No  Is the patient on methotrexate? :No  GYNECOLOGIC HISTORY:  No LMP recorded. Age at first live birth: 77 years old She is [place] P4 (the last was still born).  Her LMP was age 63. She never took HRT or oral contraception.   Current Complaints / other details:   There were no vitals filed for this visit. Wt Readings from Last 3 Encounters:  11/26/17 205 lb 0.5 oz (93 kg)  11/26/17 205 lb 0.5 oz (93 kg)  11/14/17 205 lb 0.4 oz (93 kg)       Georgena Spurling, RN 12/21/2017,11:32 AM

## 2017-12-21 NOTE — Telephone Encounter (Signed)
Spoke with patient's daughter [name]) in regards to starting anastrozole.  Per NP if they cannot reschedule appt for next week to go ahead and start med.  Daughter to check with nursing home to make sure they have med and call us back.  Per daughter, patient has switched nursing homes.

## 2017-12-21 NOTE — Telephone Encounter (Signed)
Called about 11/25 °

## 2017-12-27 ENCOUNTER — Ambulatory Visit
Admission: RE | Admit: 2017-12-27 | Discharge: 2017-12-27 | Disposition: A | Discharge status: Home / Self Care | Payer: Medicare Other | Source: Ambulatory Visit | Attending: Radiation Oncology | Admitting: Radiation Oncology

## 2017-12-27 ENCOUNTER — Other Ambulatory Visit: Payer: Self-pay

## 2017-12-27 ENCOUNTER — Encounter: Payer: Self-pay | Admitting: Radiation Oncology

## 2017-12-27 VITALS — BP 118/65 | HR 86 | Temp 98.5°F | Resp 24

## 2017-12-27 DIAGNOSIS — Z170 Estrogen receptor positive status [ER+]: Principal | ICD-10-CM

## 2017-12-27 DIAGNOSIS — E785 Hyperlipidemia, unspecified: Secondary | ICD-10-CM | POA: Insufficient documentation

## 2017-12-27 DIAGNOSIS — I509 Heart failure, unspecified: Secondary | ICD-10-CM | POA: Insufficient documentation

## 2017-12-27 DIAGNOSIS — Z79811 Long term (current) use of aromatase inhibitors: Secondary | ICD-10-CM | POA: Insufficient documentation

## 2017-12-27 DIAGNOSIS — C50212 Malignant neoplasm of upper-inner quadrant of left female breast: Secondary | ICD-10-CM

## 2017-12-27 DIAGNOSIS — Z8673 Personal history of transient ischemic attack (TIA), and cerebral infarction without residual deficits: Secondary | ICD-10-CM | POA: Diagnosis not present

## 2017-12-27 DIAGNOSIS — I10 Essential (primary) hypertension: Secondary | ICD-10-CM | POA: Diagnosis not present

## 2017-12-27 DIAGNOSIS — Z79899 Other long term (current) drug therapy: Secondary | ICD-10-CM | POA: Diagnosis not present

## 2017-12-27 DIAGNOSIS — Z7982 Long term (current) use of aspirin: Secondary | ICD-10-CM | POA: Insufficient documentation

## 2017-12-27 DIAGNOSIS — C50912 Malignant neoplasm of unspecified site of left female breast: Secondary | ICD-10-CM

## 2017-12-27 DIAGNOSIS — Z7984 Long term (current) use of oral hypoglycemic drugs: Secondary | ICD-10-CM | POA: Insufficient documentation

## 2017-12-27 DIAGNOSIS — E119 Type 2 diabetes mellitus without complications: Secondary | ICD-10-CM | POA: Insufficient documentation

## 2017-12-27 DIAGNOSIS — Z9012 Acquired absence of left breast and nipple: Secondary | ICD-10-CM | POA: Diagnosis not present

## 2017-12-27 DIAGNOSIS — F259 Schizoaffective disorder, unspecified: Secondary | ICD-10-CM | POA: Insufficient documentation

## 2017-12-27 NOTE — Progress Notes (Signed)
Radiation Oncology         (336) [phone] ________________________________  Name: Carolyn Parrish        MRN: [id number]  Date of Service: 12/27/2017 DOB: Jun 02, 1940  CC:Hendricks Limes, MD  Erroll Luna, MD     REFERRING PHYSICIAN: Erroll Luna, MD   DIAGNOSIS: The primary encounter diagnosis was Malignant neoplasm of upper-inner quadrant of left breast in female, estrogen receptor positive ([place]). A diagnosis of Breast cancer, stage 2, left ([place]) was also pertinent to this visit.   HISTORY OF PRESENT ILLNESS: Carolyn Parrish is a 77 y.o. female originally seen in the multidisciplinary breast clinic for a new diagnosis of left breast cancer. The patient was noted to have a palpable mass in the left breast and has a history of prior CVA and has been in an SNF since 1996 as a result of CVA history. She had a mass on diagnostic ultrasound that measured 2.8 x 1. 6 x 1 cm at the 9:00 position along the upper inner quadrant. She did not have any axillary adenopathy. 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 She had been counseled on the options of surgical intervention with mastectomy and this was felt to be the preferred route given her history, in the hopes of avoiding radiotherapy.  She did undergo her left mastectomy with sentinel node biopsy on 10/30/2017, unfortunately her tumor measured 2.8 cm, it was still an invasive ductal carcinoma but it invaded the dermis and epidermis with lymphovascular space involvement.  Of her to sampled nodes, one contained cancer with extracapsular extension.  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  She is not a candidate for systemic chemotherapy, but has begun anastrozole.  Her case was discussed in breast oncology conference, and given the T4 component of her cancer, she comes today  to discuss options of adjuvant radiotherapy.   PREVIOUS RADIATION THERAPY: No   PAST MEDICAL HISTORY:  Past Medical History:  Diagnosis Date  . Cancer (HCC)    BREAST CANCER  . CHF (congestive heart failure) ([place])   . Diabetes mellitus without complication ([place])   . Hyperlipidemia   . Hypertension   . Schizoaffective disorder ([place])   . Stroke [facility])        PAST SURGICAL HISTORY: Past Surgical History:  Procedure Laterality Date  . CHOLECYSTECTOMY     2016  . MASTECTOMY COMPLETE / SIMPLE Left 10/30/2017  . SIMPLE MASTECTOMY WITH AXILLARY SENTINEL NODE BIOPSY Left 10/30/2017   Procedure: LEFT SIMPLE MASTECTOMY;  Surgeon: Erroll Luna, MD;  Location: [place];  Service: General;  Laterality: Left;     FAMILY HISTORY: No family history on file.   SOCIAL HISTORY:  reports that she has never smoked. She has never used smokeless tobacco. She reports that she drank alcohol. She reports that she does not use drugs. The patient is widowed and her daughter is her HCPOA and accompanies her today.    ALLERGIES: Atorvastatin   MEDICATIONS:  Current Outpatient Medications  Medication Sig Dispense Refill  . acetaminophen (TYLENOL) 325 MG tablet Take 325-650 mg by mouth every 4 (four) hours as needed ('650mg'$  for arthritis pain, '325mg'$  for fever >101).     Marland Kitchen alum & mag hydroxide-simeth (MYLANTA) [phone] MG/5ML suspension Take 30 mLs by mouth every 4 (four) hours as needed for indigestion  or heartburn.    . anastrozole (ARIMIDEX) 1 MG tablet Take 1 tablet (1 mg total) by mouth daily. 90 tablet 4  . aspirin 81 MG chewable tablet Chew 81 mg by mouth daily.    . camphor-menthol (SARNA) lotion Apply 1 application topically 2 (two) times daily as needed for itching.    . divalproex (DEPAKOTE ER) 250 MG 24 hr tablet Take 250 mg by mouth daily.    Marland Kitchen docusate sodium (COLACE) 100 MG capsule Take 100 mg by mouth 2 (two) times daily.    . magnesium hydroxide (MILK OF MAGNESIA) 400 MG/5ML  suspension Take 30 mLs by mouth daily as needed for mild constipation.    . metFORMIN (GLUCOPHAGE) 500 MG tablet Take 500 mg by mouth daily.    Marland Kitchen oxyCODONE (OXY IR/ROXICODONE) 5 MG immediate release tablet Take 1 tablet (5 mg total) by mouth every 4 (four) hours as needed for moderate pain. 15 tablet 0  . polyethylene glycol (MIRALAX / GLYCOLAX) packet Take 17 g by mouth daily.    . vitamin B-12 (CYANOCOBALAMIN) 500 MCG tablet Take 500 mcg by mouth daily.    . Probiotic Product (PROBIOTIC DAILY PO) Take 1 capsule by mouth daily. x7 days     No current facility-administered medications for this encounter.      REVIEW OF SYSTEMS: On review of systems, the patient reports that she is doing well overall.  She denies any difficulty with range of motion, her skin is doing well per report, and her last JP tube was removed a little over a week ago, since that time they have not had any concerns with this.  No other complaints are verbalized.     PHYSICAL EXAM:  Wt Readings from Last 3 Encounters:  11/26/17 205 lb 0.5 oz (93 kg)  11/26/17 205 lb 0.5 oz (93 kg)  11/14/17 205 lb 0.4 oz (93 kg)   Temp Readings from Last 3 Encounters:  12/27/17 98.5 F (36.9 C) (Oral)  11/26/17 98.1 F (36.7 C) (Oral)  11/26/17 98.1 F (36.7 C) (Oral)   BP Readings from Last 3 Encounters:  12/27/17 118/65  11/26/17 118/76  11/26/17 118/76   Pulse Readings from Last 3 Encounters:  12/27/17 86  11/26/17 72  11/26/17 72     In general this is a well appearing African American female in no acute distress. She is alert and oriented x4 and appropriate throughout the examination. HEENT reveals that the patient is normocephalic, atraumatic. EOMs are intact. Skin is intact without any evidence of gross lesions. Cardiopulmonary assessment is negative for acute distress and she exhibits normal effort.  The left chest wall is assessed.  She is a well-healed mastectomy scar without evidence of erythema, separation or  chest wall/left upper extremity edema.  She does have a retained suture at the medial JP drain site.  While the suture knot could not be identified, the Prolene suture was clipped to the level of the skin as it has been bothering the patient.  We will continue to follow this and remove any additional suture if it is visible.   ECOG = 1  0 - Asymptomatic (Fully active, able to carry on all predisease activities without restriction)  1 - Symptomatic but completely ambulatory (Restricted in physically strenuous activity but ambulatory and able to carry out work of a light or sedentary nature. For example, light housework, office work)  2 - Symptomatic, <50% in bed during the day (Ambulatory and capable of all self care  but unable to carry out any work activities. Up and about more than 50% of waking hours)  3 - Symptomatic, >50% in bed, but not bedbound (Capable of only limited self-care, confined to bed or chair 50% or more of waking hours)  4 - Bedbound (Completely disabled. Cannot carry on any self-care. Totally confined to bed or chair)  5 - Death   Eustace Pen MM, Creech RH, Tormey DC, et al. [id number]). "Toxicity and response criteria of the [facility]". [place] Oncol. 5 (6): 649-55    LABORATORY DATA:  Lab Results  Component Value Date   WBC 9.3 11/30/2017   HGB 13.1 11/30/2017   HCT 39 11/30/2017   MCV 96.6 11/18/2017   PLT 360 11/30/2017   Lab Results  Component Value Date   NA 140 11/18/2017   K 4.2 11/18/2017   CL 110 11/18/2017   CO2 22 11/18/2017   Lab Results  Component Value Date   ALT 29 11/14/2017   AST 31 11/14/2017   ALKPHOS 75 11/14/2017   BILITOT 0.8 11/14/2017      RADIOGRAPHY: No results found.     IMPRESSION/PLAN: 1. 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 Dr. Lisbeth Renshaw discusses the final pathology findings.  Unfortunately the patient does have a more  aggressive cancer than what was originally thought.  Her case again had been discussed in breast oncology conference.  We wanted to meet back to discuss the options of adjuvant radiotherapy in the postmastectomy setting.  We discussed the risks, benefits, short and long-term effects of radiotherapy.  Dr. Lisbeth Renshaw discusses the delivery and logistics of treatment and would recommend a course of 6-1/2 weeks to the chest wall and regional lymph nodes of the left.  We discussed attempting deep breath-hold technique to avoid exposure of the heart.  At the end of the conversation the patient and her daughter feel informed to make a decision to move forward with treatment for local control and prevention of recurrence of her disease.  She will also continue her anastrozole, and continue to be followed by Dr. Jana Hakim in medical oncology. Written consent is obtained and placed in the chart, a copy was provided to the patient.  She will simulate today following her appointment. 2.  Research project for understanding the patient experience.  In our department we are currently looking at a better way to understand patient experience for those undergoing radiotherapy.  She was counseled on the opportunity to participate in this project, after discussing the logistics of this, the patient is interested in proceeding.  This would involve photographing her chest wall and right breast.  We will discuss this again and review consent when she is scheduled to begin treatment.  In a visit lasting 45 minutes, greater than 50% of the time was spent face to face discussing her case, and coordinating the patient's care.   The above documentation reflects my direct findings during this shared patient visit. Please see the separate note by Dr. Lisbeth Renshaw on this date for the remainder of the patient's plan of care.    Carola Rhine, PAC

## 2017-12-29 NOTE — Progress Notes (Signed)
  Radiation Oncology         [phone]) [phone] ________________________________  Name: Carolyn Parrish MRN: [id number]  Date: 12/27/2017  DOB: [birth year]  Optical Surface Tracking Plan:  Since intensity modulated radiotherapy (IMRT) and 3D conformal radiation treatment methods are predicated on accurate and precise positioning for treatment, intrafraction motion monitoring is medically necessary to ensure accurate and safe treatment delivery.  The ability to quantify intrafraction motion without excessive ionizing radiation dose can only be performed with optical surface tracking. Accordingly, surface imaging offers the opportunity to obtain 3D measurements of patient position throughout IMRT and 3D treatments without excessive radiation exposure.  I am ordering optical surface tracking for this patient's upcoming course of radiotherapy. ________________________________  Kyung Rudd, MD 12/29/2017 9:37 PM    Reference:   Ursula Alert, J, et al. Surface imaging-based analysis of intrafraction motion for breast radiotherapy patients.Journal of [place], n. 6, nov. 2014. ISSN [id number].   Available at: <http://www.jacmp.org/index.php/jacmp/article/view/4957>.

## 2017-12-29 NOTE — Progress Notes (Signed)
  Radiation Oncology         [phone]) [phone] ________________________________  Name: GERMANI GAVILANES MRN: [id number]  Date: 12/27/2017  DOB: October 25, 1940  Diagnosis DIAGNOSIS:     ICD-10-CM   1. Malignant neoplasm of upper-inner quadrant of left breast in female, estrogen receptor positive ([place]) C50.212    Z17.0      SIMULATION AND TREATMENT PLANNING NOTE  The patient presented for simulation prior to beginning her course of radiation treatment for her diagnosis of left-sided breast cancer. The patient was placed in a supine position on a breast board. A customized vac-lock bag was also constructed and this complex treatment device will be used on a daily basis during her treatment. In this fashion, a CT scan was obtained through the chest area and an isocenter was placed near the chest wall at the upper aspect of the right chest. A breath-hold technique has also been evaluated to determine if this significantly improves the spatial relationship between the target region and the heart. Based on this analysis, a breath-hold technique has not been ordered for the patient's treatment.  The patient will be planned to receive a course of radiation initially to a dose of 50.4 gray. This will consist of a 4 field technique targeting the left chest wall as well as the supraclavicular region. Therefore 2 customized medial and lateral tangent fields have been created targeting the chest wall, and also 2 additional customized fields have been designed to treat the supraclavicular region both with a left supraclavicular field and a left posterior axillary boost field. A forward planning/reduced field technique will also be evaluated to determine if this significantly improves the dose homogeneity of the overall plan. Therefore, additional customized blocks/fields may be necessary.  This initial treatment will be accomplished at 1.8 gray per fraction.   The initial plan will consist of a 3-D conformal technique.  The target volume/scar, heart and lungs have been contoured and dose volume histograms of each of these structures will be evaluated as part of the 3-D conformal treatment planning process.   It is anticipated that the patient will then receive a 10 gray boost to the surgical scar. This will be accomplished at 2 gray per fraction. The final anticipated total dose therefore will correspond to 60.4 gray.    _______________________________   Jodelle Gross, MD, PhD

## 2017-12-31 ENCOUNTER — Telehealth: Payer: Self-pay | Admitting: Adult Health

## 2017-12-31 ENCOUNTER — Inpatient Hospital Stay (HOSPITAL_BASED_OUTPATIENT_CLINIC_OR_DEPARTMENT_OTHER): Payer: Medicare Other | Admitting: Adult Health

## 2017-12-31 ENCOUNTER — Encounter: Payer: Self-pay | Admitting: Adult Health

## 2017-12-31 ENCOUNTER — Inpatient Hospital Stay: Payer: Medicare Other

## 2017-12-31 DIAGNOSIS — Z9012 Acquired absence of left breast and nipple: Secondary | ICD-10-CM | POA: Diagnosis not present

## 2017-12-31 DIAGNOSIS — C50112 Malignant neoplasm of central portion of left female breast: Secondary | ICD-10-CM | POA: Diagnosis present

## 2017-12-31 DIAGNOSIS — Z170 Estrogen receptor positive status [ER+]: Secondary | ICD-10-CM

## 2017-12-31 DIAGNOSIS — Z79811 Long term (current) use of aromatase inhibitors: Secondary | ICD-10-CM

## 2017-12-31 DIAGNOSIS — E119 Type 2 diabetes mellitus without complications: Secondary | ICD-10-CM

## 2017-12-31 DIAGNOSIS — C50212 Malignant neoplasm of upper-inner quadrant of left female breast: Secondary | ICD-10-CM | POA: Diagnosis not present

## 2017-12-31 DIAGNOSIS — F259 Schizoaffective disorder, unspecified: Secondary | ICD-10-CM | POA: Diagnosis not present

## 2017-12-31 DIAGNOSIS — I6359 Cerebral infarction due to unspecified occlusion or stenosis of other cerebral artery: Secondary | ICD-10-CM

## 2017-12-31 DIAGNOSIS — I110 Hypertensive heart disease with heart failure: Secondary | ICD-10-CM | POA: Diagnosis not present

## 2017-12-31 DIAGNOSIS — F258 Other schizoaffective disorders: Secondary | ICD-10-CM

## 2017-12-31 DIAGNOSIS — Z79899 Other long term (current) drug therapy: Secondary | ICD-10-CM | POA: Diagnosis not present

## 2017-12-31 DIAGNOSIS — Z7189 Other specified counseling: Secondary | ICD-10-CM

## 2017-12-31 DIAGNOSIS — Z8673 Personal history of transient ischemic attack (TIA), and cerebral infarction without residual deficits: Secondary | ICD-10-CM | POA: Diagnosis not present

## 2017-12-31 LAB — COMPREHENSIVE METABOLIC PANEL
ALT: 35 U/L (ref 0–44)
AST: 20 U/L (ref 15–41)
Albumin: 3.4 g/dL — ABNORMAL LOW (ref 3.5–5.0)
Alkaline Phosphatase: 83 U/L (ref 38–126)
Anion gap: 10 (ref 5–15)
BUN: 9 mg/dL (ref 8–23)
CO2: 30 mmol/L (ref 22–32)
CREATININE: 0.79 mg/dL (ref 0.44–1.00)
Calcium: 9.8 mg/dL (ref 8.9–10.3)
Chloride: 104 mmol/L (ref 98–111)
GFR calc non Af Amer: >60 mL/min (ref >60)
Glucose, Bld: 118 mg/dL — ABNORMAL HIGH (ref 70–99)
Potassium: 4.3 mmol/L (ref 3.5–5.1)
Sodium: 144 mmol/L (ref 135–145)
Total Bilirubin: 0.4 mg/dL (ref 0.3–1.2)
Total Protein: 7.5 g/dL (ref 6.5–8.1)

## 2017-12-31 LAB — CBC WITH DIFFERENTIAL/PLATELET
Abs Immature Granulocytes: 0.06 10*3/uL (ref 0.00–0.07)
BASOS ABS: 0.1 10*3/uL (ref 0.0–0.1)
Basophils Relative: 0 %
EOS PCT: 2 %
Eosinophils Absolute: 0.2 10*3/uL (ref 0.0–0.5)
HEMATOCRIT: 44.2 % (ref 36.0–46.0)
Hemoglobin: 13.5 g/dL (ref 12.0–15.0)
Immature Granulocytes: 1 %
Lymphocytes Relative: 29 %
Lymphs Abs: 3.5 10*3/uL (ref 0.7–4.0)
MCH: 29.8 pg (ref 26.0–34.0)
MCHC: 30.5 g/dL (ref 30.0–36.0)
MCV: 97.6 fL (ref 80.0–100.0)
Monocytes Absolute: 0.9 10*3/uL (ref 0.1–1.0)
Monocytes Relative: 7 %
NRBC: 0 % (ref 0.0–0.2)
Neutro Abs: 7.5 10*3/uL (ref 1.7–7.7)
Neutrophils Relative %: 61 %
Platelets: 286 10*3/uL (ref 150–400)
RBC: 4.53 MIL/uL (ref 3.87–5.11)
RDW: 15.9 % — AB (ref 11.5–15.5)
WBC: 12.1 10*3/uL — AB (ref 4.0–10.5)

## 2017-12-31 NOTE — Telephone Encounter (Signed)
Per Mendel Ryder will have to call patient back with 11/25 los orders

## 2017-12-31 NOTE — Progress Notes (Addendum)
Carolyn Parrish  [phone] [phone]     ID: Carolyn Parrish DOB: [birth year]  MR#: [id number]  OZH#:[id number]  Patient Care Team: Hendricks Limes, MD as PCP - General (Internal Medicine) Erroll Luna, MD as Consulting Physician (General Surgery) Magrinat, Virgie Dad, MD as Consulting Physician (Oncology) Kyung Rudd, MD as Consulting Physician (Radiation Oncology) Estevan Oaks, NP as Nurse Practitioner (Hospice and Palliative Medicine) OTHER MD:  CHIEF COMPLAINT: Estrogen receptor positive breast cancer  CURRENT TREATMENT: Awaiting definitive surgery  HISTORY OF CURRENT ILLNESS: Carolyn Parrish was found to have a palpable mass in the left breast. She underwent bilateral diagnostic mammography with tomography and left breast ultrasonography at [facility] on 08/31/2017 showing: breast density category C. There was a suspicious mass in the left breast at the 9 o'clock retroareolar location involving the medial aspect of the nipple, measuring 2.8 x 1.6 x 1.0 cm. Sonographic evaluation of the left axilla demonstrates no suspicious lymphadenopathy. Limited mammographic evaluation of the right breast is unremarkable.  Accordingly on 09/05/2017 she proceeded to biopsy of the left breast area in question. The pathology from this procedure showed ([id number]): Invasive ductal carcinoma grade 2. Perineural invasion is identified. Prognostic indicators significant for: estrogen receptor, 100% positive and progesterone receptor, 80% positive, both with strong staining intensity. Proliferation marker Ki67 at 15%. HER2 not amplified with ratios HER2/CEP17 signals 1.70 and average HER2 copies per cell 4.75.   The patient's subsequent history is as detailed below.  INTERVAL HISTORY: Carolyn Parrish was scheduled today for follow up and treatment of her estrogen receptor positive breast cancer.  However she has had a rough course postop, with sepsis, and is  currently in an SNF [facility]).  She is here with her daughter and has started Anastrozole with good tolerance.  She underwent left mastectomy on 10/30/2017 with pathology showing ([id number]): invasive ductal carcinoma grade II spanning 2.8 cm. Ductal carcinoma in situ, intermediate grade. Metastatic carcinoma in 1 out of 2 intramammary lymph nodes with extracapsular extension. The resection margins are negative for carcinoma.    REVIEW OF SYSTEMS:  Carolyn Parrish is doing well today.  Her daughter notes some dry skin.  Her daughter has noted no issues today with taking the Anastrozole such as joint aches and pains, or hot flashes.  A detailed ROS was conducted and was otherwise non contributory today.    PAST MEDICAL HISTORY: Past Medical History:  Diagnosis Date  . Cancer (HCC)    BREAST CANCER  . CHF (congestive heart failure) ([place])   . Diabetes mellitus without complication ([place])   . Hyperlipidemia   . Hypertension   . Schizoaffective disorder ([place])   . Stroke [facility])   Polyosteoarthrtis and generalized muscle weakness.  Diabetes- takes metformin  PAST SURGICAL HISTORY: Past Surgical History:  Procedure Laterality Date  . CHOLECYSTECTOMY     2016  . MASTECTOMY COMPLETE / SIMPLE Left 10/30/2017  . SIMPLE MASTECTOMY WITH AXILLARY SENTINEL NODE BIOPSY Left 10/30/2017   Procedure: LEFT SIMPLE MASTECTOMY;  Surgeon: Erroll Luna, MD;  Location: [place];  Service: General;  Laterality: Left;    Cholecystectomy 2016  FAMILY HISTORY History reviewed. No pertinent family history. The patient's father died of old age at 40. The patient's mother also died of old age at 21. The patient has 5 brothers and 6 sisters. The patient denies a family history of breast or ovarian cancer.  GYNECOLOGIC HISTORY:  No LMP recorded. Patient is postmenopausal. Age at first live birth: 77 years old  She is GX P4 (the last was still born).  Her LMP was age [age]. She never took HRT or oral contraception.     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 The patient is widowed. The patient's daughter, [name] lives in [place], [place] and works as a Freight forwarder. The patient's son, [name] lives in [place], [place] and works as a Administrator. The patient's daughter, [name] lives in [place] and works as a Customer service manager. The patient as 3 grandchildren. She is a Psychologist, forensic.      ADVANCED DIRECTIVES: The patient's daughter, [name] is her HCPOA, and she can be reached at [phone]   HEALTH MAINTENANCE: Social History   Tobacco Use  . Smoking status: Never Smoker  . Smokeless tobacco: Never Used  Substance Use Topics  . Alcohol use: Not Currently  . Drug use: Never     Colonoscopy:   PAP:   Bone density:    Allergies  Allergen Reactions  . Atorvastatin     UNSPECIFIED REACTION     Current Outpatient Medications  Medication Sig Dispense Refill  . acetaminophen (TYLENOL) 325 MG tablet Take 325-650 mg by mouth every 4 (four) hours as needed (645m for arthritis pain, 3240mfor fever >101).     . Marland Kitchenlum & mag hydroxide-simeth (MYLANTA) 200-200-20 MG/5ML suspension Take 30 mLs by mouth every 4 (four) hours as needed for indigestion or heartburn.    . anastrozole (ARIMIDEX) 1 MG tablet Take 1 tablet (1 mg total) by mouth daily. 90 tablet 4  . aspirin 81 MG chewable tablet Chew 81 mg by mouth daily.    . camphor-menthol (SARNA) lotion Apply 1 application topically 2 (two) times daily as needed for itching.    . divalproex (DEPAKOTE ER) 250 MG 24 hr tablet Take 250 mg by mouth daily.    . Marland Kitchenocusate sodium (COLACE) 100 MG capsule Take 100 mg by mouth 2 (two) times daily.    . magnesium hydroxide (MILK OF MAGNESIA) 400 MG/5ML suspension Take 30 mLs by mouth daily as needed for mild constipation.    . metFORMIN (GLUCOPHAGE) 500 MG tablet Take 500 mg by mouth daily.    . Marland KitchenxyCODONE (OXY IR/ROXICODONE) 5 MG immediate release tablet Take 1 tablet  (5 mg total) by mouth every 4 (four) hours as needed for moderate pain. 15 tablet 0  . polyethylene glycol (MIRALAX / GLYCOLAX) packet Take 17 g by mouth daily.    . Probiotic Product (PROBIOTIC DAILY PO) Take 1 capsule by mouth daily. x7 days    . vitamin B-12 (CYANOCOBALAMIN) 500 MCG tablet Take 500 mcg by mouth daily.     No current facility-administered medications for this visit.     OBJECTIVE: There were no vitals filed for this visit.   There is no height or weight on file to calculate BMI.   Wt Readings from Last 3 Encounters:  11/26/17 205 lb 0.5 oz (93 kg)  11/26/17 205 lb 0.5 oz (93 kg)  11/14/17 205 lb 0.4 oz (93 kg)  ECOG FS:2 - Symptomatic, <50% confined to bed GENERAL: Patient is a well appearing female in no acute distress HEENT:  Sclerae anicteric.  Oropharynx clear and moist. No ulcerations or evidence of oropharyngeal candidiasis. Neck is supple.  NODES:  No cervical, supraclavicular, or axillary lymphadenopathy palpated.  BREAST EXAM:  S/p left mastectomy, no sign of local recurrence, right breast benign LUNGS:  Clear to auscultation bilaterally.  No wheezes  or rhonchi. HEART:  Regular rate and rhythm. No murmur appreciated. ABDOMEN:  Soft, nontender.  Positive, normoactive bowel sounds. No organomegaly palpated. MSK:  No focal spinal tenderness to palpation.  EXTREMITIES:  No peripheral edema.   SKIN:  Clear with no obvious rashes or skin changes. No nail dyscrasia.    LAB RESULTS:  CMP     Component Value Date/Time   NA 144 12/31/2017 1101   NA 143 1940-06-21   K 4.3 12/31/2017 1101   CL 104 12/31/2017 1101   CO2 30 12/31/2017 1101   GLUCOSE 118 (H) 12/31/2017 1101   BUN 9 12/31/2017 1101   BUN 11 Oct 16, 1940   CREATININE 0.79 12/31/2017 1101   CREATININE 0.90 09/12/2017 1132   CALCIUM 9.8 12/31/2017 1101   PROT 7.5 12/31/2017 1101   ALBUMIN 3.4 (L) 12/31/2017 1101   AST 20 12/31/2017 1101   AST 17 09/12/2017 1132   ALT 35 12/31/2017 1101   ALT 26  09/12/2017 1132   ALKPHOS 83 12/31/2017 1101   BILITOT 0.4 12/31/2017 1101   BILITOT 0.5 09/12/2017 1132   GFRNONAA >60 12/31/2017 1101   GFRNONAA >60 09/12/2017 1132   GFRAA >60 12/31/2017 1101   GFRAA >60 09/12/2017 1132    No results found for: TOTALPROTELP, ALBUMINELP, A1GS, A2GS, BETS, BETA2SER, GAMS, MSPIKE, SPEI  No results found for: KPAFRELGTCHN, LAMBDASER, KAPLAMBRATIO  Lab Results  Component Value Date   WBC 12.1 (H) 12/31/2017   NEUTROABS 7.5 12/31/2017   HGB 13.5 12/31/2017   HCT 44.2 12/31/2017   MCV 97.6 12/31/2017   PLT 286 12/31/2017    _0 @  No results found for: LABCA2  No components found for: ZOXWRU045  No results for input(s): INR in the last 168 hours.  No results found for: LABCA2  No results found for: WUJ811  No results found for: BJY782  No results found for: NFA213  No results found for: CA2729  No components found for: HGQUANT  No results found for: CEA1 / No results found for: CEA1   No results found for: AFPTUMOR  No results found for: Regal  No results found for: PSA1  Appointment on 12/31/2017  Component Date Value Ref Range Status  . Sodium 12/31/2017 144  135 - 145 mmol/L Final  . Potassium 12/31/2017 4.3  3.5 - 5.1 mmol/L Final  . Chloride 12/31/2017 104  98 - 111 mmol/L Final  . CO2 12/31/2017 30  22 - 32 mmol/L Final  . Glucose, Bld 12/31/2017 118* 70 - 99 mg/dL Final  . BUN 12/31/2017 9  8 - 23 mg/dL Final  . Creatinine, Ser 12/31/2017 0.79  0.44 - 1.00 mg/dL Final  . Calcium 12/31/2017 9.8  8.9 - 10.3 mg/dL Final  . Total Protein 12/31/2017 7.5  6.5 - 8.1 g/dL Final  . Albumin 12/31/2017 3.4* 3.5 - 5.0 g/dL Final  . AST 12/31/2017 20  15 - 41 U/L Final  . ALT 12/31/2017 35  0 - 44 U/L Final  . Alkaline Phosphatase 12/31/2017 83  38 - 126 U/L Final  . Total Bilirubin 12/31/2017 0.4  0.3 - 1.2 mg/dL Final  . GFR calc non Af Amer 12/31/2017 >60  >60 mL/min Final  . GFR calc Af Amer 12/31/2017 >60   >60 mL/min Final   Comment: (NOTE) The eGFR has been calculated using the CKD EPI equation. This calculation has not been validated in all clinical situations. eGFR's persistently <60 mL/min signify possible Chronic Kidney Disease.   Georgiann Hahn gap 12/31/2017  10  5 - 15 Final   Performed at [facility], [place] [address] [place], [place] [address]  . WBC 12/31/2017 12.1* 4.0 - 10.5 K/uL Final  . RBC 12/31/2017 4.53  3.87 - 5.11 MIL/uL Final  . Hemoglobin 12/31/2017 13.5  12.0 - 15.0 g/dL Final  . HCT 12/31/2017 44.2  36.0 - 46.0 % Final  . MCV 12/31/2017 97.6  80.0 - 100.0 fL Final  . MCH 12/31/2017 29.8  26.0 - 34.0 pg Final  . MCHC 12/31/2017 30.5  30.0 - 36.0 g/dL Final  . RDW 12/31/2017 15.9* 11.5 - 15.5 % Final  . Platelets 12/31/2017 286  150 - 400 K/uL Final  . nRBC 12/31/2017 0.0  0.0 - 0.2 % Final  . Neutrophils Relative % 12/31/2017 61  % Final  . Neutro Abs 12/31/2017 7.5  1.7 - 7.7 K/uL Final  . Lymphocytes Relative 12/31/2017 29  % Final  . Lymphs Abs 12/31/2017 3.5  0.7 - 4.0 K/uL Final  . Monocytes Relative 12/31/2017 7  % Final  . Monocytes Absolute 12/31/2017 0.9  0.1 - 1.0 K/uL Final  . Eosinophils Relative 12/31/2017 2  % Final  . Eosinophils Absolute 12/31/2017 0.2  0.0 - 0.5 K/uL Final  . Basophils Relative 12/31/2017 0  % Final  . Basophils Absolute 12/31/2017 0.1  0.0 - 0.1 K/uL Final  . Immature Granulocytes 12/31/2017 1  % Final  . Abs Immature Granulocytes 12/31/2017 0.06  0.00 - 0.07 K/uL Final   Performed at [facility], [place] [address] [place], [place] [address]    (this displays the last labs from the last 3 days)  No results found for: TOTALPROTELP, ALBUMINELP, A1GS, A2GS, BETS, BETA2SER, GAMS, MSPIKE, SPEI (this displays SPEP labs)  No results found for: KPAFRELGTCHN, LAMBDASER, KAPLAMBRATIO (kappa/lambda light chains)  No results found for: HGBA, HGBA2QUANT, HGBFQUANT, HGBSQUAN (Hemoglobinopathy  evaluation)   No results found for: LDH  No results found for: IRON, TIBC, IRONPCTSAT (Iron and TIBC)  No results found for: FERRITIN  Urinalysis    Component Value Date/Time   COLORURINE AMBER (A) 11/14/2017 1155   APPEARANCEUR CLOUDY (A) 11/14/2017 1155   LABSPEC 1.023 11/14/2017 1155   PHURINE 5.0 11/14/2017 1155   GLUCOSEU NEGATIVE 11/14/2017 1155   HGBUR SMALL (A) 11/14/2017 1155   BILIRUBINUR NEGATIVE 11/14/2017 1155   KETONESUR NEGATIVE 11/14/2017 1155   PROTEINUR 100 (A) 11/14/2017 1155   NITRITE NEGATIVE 11/14/2017 1155   LEUKOCYTESUR SMALL (A) 11/14/2017 1155     STUDIES: No results found.  ELIGIBLE FOR AVAILABLE RESEARCH PROTOCOL: no  ASSESSMENT: 77 y.o. [facility] skilled [facility] resident status post left breast upper outer quadrant biopsy 09/05/2017 for a clinical T2N0, stage Ib invasive ductal carcinoma, grade 2, estrogen and progesterone receptor positive, HER-2 not amplified, with an MIB-1 of 15%.  (1) status post left mastectomy 10/30/2017, for a pT4 pN1, stage IIIB invasive ductal carcinoma, grade 2, with lymphovascular invasion and involvement of the nipple and epidermis of the areola, but with negative margins  (a) 1 of 2 non-sentinel lymph nodes removed positive straight capsular extension  (2) Anastrozole started on 12/12/2017  (3) Adjuvant radiation from 01/08/18 to 02/26/18  PLAN:  Tansy is doing well today.  She is tolerating the Anastrozole well.  She met with Dr. Jana Hakim today who examined her mastectomy site and reviewed her pathology results and discussed the overall plan with her.  Katniss will start radiation on 01/08/18 and continue this until  end of January.  Jamilia will return to see Dr. Jana Hakim towards the end of radiation for f/u.    Next is the issue with the dry skin.  She was recommended to use an unscented cream and one with Aloe.  Her daughter is going to get that for her.    We will see Mirela back in mid to late January  for labs and f/u.  She and her daughter know to call for any questions or concerns that may arise prior to her next appointment with [place].    Wilber Bihari, NP  01/01/18 11:40 AM Medical Oncology and Hematology [facility] [address] [place], [place] [address] Tel. [phone]    Fax. [phone]    ADDENDUM: Kalene is tolerating her anastrozole well.  The plan is to continue that for a minimum of 5 years.  She is at high risk for both local and systemic recurrence.  She will benefit from adjuvant radiation and that is being scheduled.  She will see me again in mid January.  At that point we will set her up for longer-term follow-up.  They know to call for any other issues that may develop before the next visit.  I personally saw this patient and performed a substantive portion of this encounter with the listed APP documented above.   Chauncey Cruel, MD Medical Oncology and Hematology [facility] [address] [place], [place] [address] Tel. [phone]    Fax. [phone]

## 2018-01-01 ENCOUNTER — Encounter: Payer: Self-pay | Admitting: Adult Health

## 2018-01-01 ENCOUNTER — Telehealth: Payer: Self-pay | Admitting: Oncology

## 2018-01-01 NOTE — Telephone Encounter (Signed)
Called regarding 1/14 °

## 2018-01-07 ENCOUNTER — Encounter (HOSPITAL_COMMUNITY): Payer: Self-pay | Admitting: *Deleted

## 2018-01-07 ENCOUNTER — Inpatient Hospital Stay (HOSPITAL_COMMUNITY)
Admission: EM | Admit: 2018-01-07 | Discharge: 2018-01-12 | DRG: 071 | Disposition: A | Discharge status: Skilled Nursing Facility | Payer: Medicare Other | Attending: Internal Medicine | Admitting: Internal Medicine

## 2018-01-07 DIAGNOSIS — Z79899 Other long term (current) drug therapy: Secondary | ICD-10-CM

## 2018-01-07 DIAGNOSIS — E785 Hyperlipidemia, unspecified: Secondary | ICD-10-CM | POA: Diagnosis present

## 2018-01-07 DIAGNOSIS — R402242 Coma scale, best verbal response, confused conversation, at arrival to emergency department: Secondary | ICD-10-CM | POA: Diagnosis present

## 2018-01-07 DIAGNOSIS — I110 Hypertensive heart disease with heart failure: Secondary | ICD-10-CM | POA: Diagnosis present

## 2018-01-07 DIAGNOSIS — E860 Dehydration: Secondary | ICD-10-CM | POA: Diagnosis present

## 2018-01-07 DIAGNOSIS — R402362 Coma scale, best motor response, obeys commands, at arrival to emergency department: Secondary | ICD-10-CM | POA: Diagnosis present

## 2018-01-07 DIAGNOSIS — Z9012 Acquired absence of left breast and nipple: Secondary | ICD-10-CM

## 2018-01-07 DIAGNOSIS — Z9049 Acquired absence of other specified parts of digestive tract: Secondary | ICD-10-CM

## 2018-01-07 DIAGNOSIS — Z170 Estrogen receptor positive status [ER+]: Secondary | ICD-10-CM

## 2018-01-07 DIAGNOSIS — G9340 Encephalopathy, unspecified: Secondary | ICD-10-CM | POA: Diagnosis not present

## 2018-01-07 DIAGNOSIS — Z853 Personal history of malignant neoplasm of breast: Secondary | ICD-10-CM

## 2018-01-07 DIAGNOSIS — L299 Pruritus, unspecified: Secondary | ICD-10-CM | POA: Diagnosis present

## 2018-01-07 DIAGNOSIS — E119 Type 2 diabetes mellitus without complications: Secondary | ICD-10-CM

## 2018-01-07 DIAGNOSIS — N390 Urinary tract infection, site not specified: Secondary | ICD-10-CM

## 2018-01-07 DIAGNOSIS — E870 Hyperosmolality and hypernatremia: Secondary | ICD-10-CM | POA: Diagnosis present

## 2018-01-07 DIAGNOSIS — C50912 Malignant neoplasm of unspecified site of left female breast: Secondary | ICD-10-CM | POA: Diagnosis present

## 2018-01-07 DIAGNOSIS — Z888 Allergy status to other drugs, medicaments and biological substances status: Secondary | ICD-10-CM

## 2018-01-07 DIAGNOSIS — Z7982 Long term (current) use of aspirin: Secondary | ICD-10-CM

## 2018-01-07 DIAGNOSIS — I639 Cerebral infarction, unspecified: Secondary | ICD-10-CM | POA: Diagnosis present

## 2018-01-07 DIAGNOSIS — C50212 Malignant neoplasm of upper-inner quadrant of left female breast: Secondary | ICD-10-CM

## 2018-01-07 DIAGNOSIS — I509 Heart failure, unspecified: Secondary | ICD-10-CM | POA: Diagnosis present

## 2018-01-07 DIAGNOSIS — F319 Bipolar disorder, unspecified: Secondary | ICD-10-CM | POA: Diagnosis present

## 2018-01-07 DIAGNOSIS — F259 Schizoaffective disorder, unspecified: Secondary | ICD-10-CM | POA: Diagnosis present

## 2018-01-07 DIAGNOSIS — R4182 Altered mental status, unspecified: Secondary | ICD-10-CM

## 2018-01-07 DIAGNOSIS — Z7984 Long term (current) use of oral hypoglycemic drugs: Secondary | ICD-10-CM

## 2018-01-07 DIAGNOSIS — E11649 Type 2 diabetes mellitus with hypoglycemia without coma: Secondary | ICD-10-CM | POA: Diagnosis not present

## 2018-01-07 DIAGNOSIS — E876 Hypokalemia: Secondary | ICD-10-CM | POA: Diagnosis not present

## 2018-01-07 DIAGNOSIS — Z8673 Personal history of transient ischemic attack (TIA), and cerebral infarction without residual deficits: Secondary | ICD-10-CM | POA: Diagnosis present

## 2018-01-07 DIAGNOSIS — R402142 Coma scale, eyes open, spontaneous, at arrival to emergency department: Secondary | ICD-10-CM | POA: Diagnosis present

## 2018-01-07 NOTE — ED Triage Notes (Signed)
Pt arrives via EMS from [place]. EMS report, pt has had AMS since last Tuesday. Pt has had jerking movements, scratching at her face and hallucinations bp 160/76, 98%RA, CBG 105

## 2018-01-08 ENCOUNTER — Emergency Department (HOSPITAL_COMMUNITY): Payer: Medicare Other

## 2018-01-08 ENCOUNTER — Telehealth: Payer: Self-pay | Admitting: *Deleted

## 2018-01-08 ENCOUNTER — Encounter (HOSPITAL_COMMUNITY): Payer: Self-pay

## 2018-01-08 ENCOUNTER — Ambulatory Visit: Payer: Medicare Other | Admitting: Radiation Oncology

## 2018-01-08 ENCOUNTER — Other Ambulatory Visit: Payer: Self-pay

## 2018-01-08 DIAGNOSIS — G9340 Encephalopathy, unspecified: Secondary | ICD-10-CM | POA: Diagnosis present

## 2018-01-08 DIAGNOSIS — R4182 Altered mental status, unspecified: Secondary | ICD-10-CM

## 2018-01-08 LAB — CBC WITH DIFFERENTIAL/PLATELET
Abs Immature Granulocytes: 0.05 10*3/uL (ref 0.00–0.07)
BASOS ABS: 0.1 10*3/uL (ref 0.0–0.1)
BASOS PCT: 0 %
EOS ABS: 0.2 10*3/uL (ref 0.0–0.5)
EOS PCT: 1 %
HEMATOCRIT: 45.9 % (ref 36.0–46.0)
Hemoglobin: 14 g/dL (ref 12.0–15.0)
Immature Granulocytes: 0 %
LYMPHS ABS: 4.7 10*3/uL — AB (ref 0.7–4.0)
Lymphocytes Relative: 32 %
MCH: 29 pg (ref 26.0–34.0)
MCHC: 30.5 g/dL (ref 30.0–36.0)
MCV: 95.2 fL (ref 80.0–100.0)
Monocytes Absolute: 1.3 10*3/uL — ABNORMAL HIGH (ref 0.1–1.0)
Monocytes Relative: 9 %
NEUTROS PCT: 58 %
NRBC: 0 % (ref 0.0–0.2)
Neutro Abs: 8.4 10*3/uL — ABNORMAL HIGH (ref 1.7–7.7)
PLATELETS: 302 10*3/uL (ref 150–400)
RBC: 4.82 MIL/uL (ref 3.87–5.11)
RDW: 15.7 % — AB (ref 11.5–15.5)
WBC: 14.7 10*3/uL — ABNORMAL HIGH (ref 4.0–10.5)

## 2018-01-08 LAB — URINALYSIS, ROUTINE W REFLEX MICROSCOPIC
BILIRUBIN URINE: NEGATIVE
Bacteria, UA: NONE SEEN
Glucose, UA: NEGATIVE mg/dL
HGB URINE DIPSTICK: NEGATIVE
Ketones, ur: 20 mg/dL — AB
Nitrite: NEGATIVE
PH: 5 (ref 5.0–8.0)
Protein, ur: NEGATIVE mg/dL
Specific Gravity, Urine: 1.02 (ref 1.005–1.030)

## 2018-01-08 LAB — COMPREHENSIVE METABOLIC PANEL
ALBUMIN: 3.9 g/dL (ref 3.5–5.0)
ALT: 23 U/L (ref 0–44)
ANION GAP: 15 (ref 5–15)
AST: 21 U/L (ref 15–41)
Alkaline Phosphatase: 64 U/L (ref 38–126)
BUN: 11 mg/dL (ref 8–23)
CHLORIDE: 102 mmol/L (ref 98–111)
CO2: 24 mmol/L (ref 22–32)
Calcium: 9.9 mg/dL (ref 8.9–10.3)
Creatinine, Ser: 0.99 mg/dL (ref 0.44–1.00)
GFR calc Af Amer: >60 mL/min (ref >60)
GFR, EST NON AFRICAN AMERICAN: 56 mL/min — AB (ref >60)
Glucose, Bld: 109 mg/dL — ABNORMAL HIGH (ref 70–99)
POTASSIUM: 3.7 mmol/L (ref 3.5–5.1)
Sodium: 141 mmol/L (ref 135–145)
Total Bilirubin: 0.9 mg/dL (ref 0.3–1.2)
Total Protein: 7.8 g/dL (ref 6.5–8.1)

## 2018-01-08 LAB — I-STAT CG4 LACTIC ACID, ED: Lactic Acid, Venous: 1.45 mmol/L (ref 0.5–1.9)

## 2018-01-08 LAB — RAPID URINE DRUG SCREEN, HOSP PERFORMED
Amphetamines: NOT DETECTED
Barbiturates: NOT DETECTED
Benzodiazepines: NOT DETECTED
Cocaine: NOT DETECTED
OPIATES: NOT DETECTED
Tetrahydrocannabinol: NOT DETECTED

## 2018-01-08 LAB — GLUCOSE, CAPILLARY
Glucose-Capillary: 119 mg/dL — ABNORMAL HIGH (ref 70–99)
Glucose-Capillary: 105 mg/dL — ABNORMAL HIGH (ref 70–99)
Glucose-Capillary: 102 mg/dL — ABNORMAL HIGH (ref 70–99)

## 2018-01-08 LAB — AMMONIA: Ammonia: 27 umol/L (ref 9–35)

## 2018-01-08 LAB — CBG MONITORING, ED: Glucose-Capillary: 103 mg/dL — ABNORMAL HIGH (ref 70–99)

## 2018-01-08 LAB — LIPASE, BLOOD: Lipase: 23 U/L (ref 11–51)

## 2018-01-08 MED ORDER — SODIUM CHLORIDE 0.9 % IV SOLN
1.0000 g | Freq: Once | INTRAVENOUS | Status: AC
  Administered 2018-01-08: 1 g via INTRAVENOUS
  Filled 2018-01-08: qty 10

## 2018-01-08 MED ORDER — ANASTROZOLE 1 MG PO TABS
1.0000 mg | ORAL_TABLET | Freq: Every day | ORAL | Status: DC
  Administered 2018-01-08 – 2018-01-12 (×5): 1 mg via ORAL
  Filled 2018-01-08 (×5): qty 1

## 2018-01-08 MED ORDER — SODIUM CHLORIDE 0.9 % IV SOLN
INTRAVENOUS | Status: DC
  Administered 2018-01-08: 07:00:00 via INTRAVENOUS

## 2018-01-08 MED ORDER — ONDANSETRON HCL 4 MG PO TABS: 4.0000 mg | ORAL_TABLET | Freq: Four times a day (QID) | ORAL | Status: DC | PRN

## 2018-01-08 MED ORDER — RA SALINE ENEMA 19-7 GM/118ML RE ENEM: 1.0000 | ENEMA | RECTAL | Status: DC | PRN

## 2018-01-08 MED ORDER — INSULIN ASPART 100 UNIT/ML ~~LOC~~ SOLN: 0.0000 [IU] | Freq: Every day | SUBCUTANEOUS | Status: DC

## 2018-01-08 MED ORDER — ENOXAPARIN SODIUM 40 MG/0.4ML ~~LOC~~ SOLN
40.0000 mg | SUBCUTANEOUS | Status: DC
  Administered 2018-01-08 – 2018-01-11 (×4): 40 mg via SUBCUTANEOUS
  Filled 2018-01-08 (×4): qty 0.4

## 2018-01-08 MED ORDER — POLYETHYLENE GLYCOL 3350 17 G PO PACK
17.0000 g | PACK | Freq: Every day | ORAL | Status: DC
  Administered 2018-01-08 – 2018-01-12 (×4): 17 g via ORAL
  Filled 2018-01-08 (×5): qty 1

## 2018-01-08 MED ORDER — DOCUSATE SODIUM 100 MG PO CAPS
100.0000 mg | ORAL_CAPSULE | Freq: Two times a day (BID) | ORAL | Status: DC
  Administered 2018-01-08 – 2018-01-12 (×9): 100 mg via ORAL
  Filled 2018-01-08 (×9): qty 1

## 2018-01-08 MED ORDER — SODIUM CHLORIDE 0.9 % IV SOLN
INTRAVENOUS | Status: DC
  Administered 2018-01-08 – 2018-01-09 (×2): via INTRAVENOUS

## 2018-01-08 MED ORDER — ACETAMINOPHEN 650 MG RE SUPP: 650.0000 mg | Freq: Four times a day (QID) | RECTAL | Status: DC | PRN

## 2018-01-08 MED ORDER — ACETAMINOPHEN 325 MG PO TABS: 650.0000 mg | ORAL_TABLET | Freq: Four times a day (QID) | ORAL | Status: DC | PRN

## 2018-01-08 MED ORDER — HALOPERIDOL LACTATE 5 MG/ML IJ SOLN
1.0000 mg | Freq: Once | INTRAMUSCULAR | Status: AC
  Administered 2018-01-08: 1 mg via INTRAVENOUS
  Filled 2018-01-08: qty 1

## 2018-01-08 MED ORDER — ASPIRIN EC 81 MG PO TBEC
81.0000 mg | DELAYED_RELEASE_TABLET | Freq: Every day | ORAL | Status: DC
  Administered 2018-01-08 – 2018-01-12 (×5): 81 mg via ORAL
  Filled 2018-01-08 (×5): qty 1

## 2018-01-08 MED ORDER — ASENAPINE MALEATE 5 MG SL SUBL
5.0000 mg | SUBLINGUAL_TABLET | Freq: Two times a day (BID) | SUBLINGUAL | Status: DC | PRN
  Filled 2018-01-08: qty 1

## 2018-01-08 MED ORDER — OXYCODONE HCL 5 MG PO TABS
5.0000 mg | ORAL_TABLET | ORAL | Status: DC | PRN
  Administered 2018-01-08: 5 mg via ORAL
  Filled 2018-01-08: qty 1

## 2018-01-08 MED ORDER — IOHEXOL 300 MG/ML  SOLN
100.0000 mL | Freq: Once | INTRAMUSCULAR | Status: AC | PRN
  Administered 2018-01-08: 100 mL via INTRAVENOUS

## 2018-01-08 MED ORDER — ALUM & MAG HYDROXIDE-SIMETH 200-200-20 MG/5ML PO SUSP: 30.0000 mL | ORAL | Status: DC | PRN

## 2018-01-08 MED ORDER — ONDANSETRON HCL 4 MG/2ML IJ SOLN: 4.0000 mg | Freq: Four times a day (QID) | INTRAMUSCULAR | Status: DC | PRN

## 2018-01-08 MED ORDER — INSULIN ASPART 100 UNIT/ML ~~LOC~~ SOLN: 0.0000 [IU] | Freq: Three times a day (TID) | SUBCUTANEOUS | Status: DC

## 2018-01-08 MED ORDER — DIVALPROEX SODIUM ER 250 MG PO TB24
250.0000 mg | ORAL_TABLET | Freq: Every day | ORAL | Status: DC
  Administered 2018-01-08 – 2018-01-12 (×5): 250 mg via ORAL
  Filled 2018-01-08 (×5): qty 1

## 2018-01-08 MED ORDER — BISACODYL 10 MG RE SUPP: 10.0000 mg | RECTAL | Status: DC | PRN

## 2018-01-08 NOTE — ED Notes (Signed)
Pt with increased agitation, trying to get out of bed. Repositioned and reoriented.

## 2018-01-08 NOTE — ED Notes (Signed)
Pt continues to be agitated, removing equipment, yelling. Attempted to reorient

## 2018-01-08 NOTE — Plan of Care (Signed)

## 2018-01-08 NOTE — Consult Note (Signed)
[facility] Face-to-Face Psychiatry Consult   Reason for Consult:  AMS Referring Physician:  Dr. Myna Hidalgo  Patient Identification: Carolyn Parrish MRN:  [id number] Principal Diagnosis: Altered mental status Diagnosis:  Active Problems:   Acute encephalopathy   Total Time spent with patient: 1 hour  Subjective:   Carolyn Parrish is a 77 y.o. female patient admitted with AMS.  HPI:   Per chart review, patient was admitted with altered mental status. She was noted to have a change in her behavior at her nursing home. She was experiencing intermittent jerking motions and unable to answer questions upon arrival to the hospital. She has been agitated. Her daughter reports that she has a history of bipolar disorder and takes Depakote 250 mg daily. She believes her presentation is most consistent with her behavior when she has a UTI. Her urinalysis was equivocal for infection. Urine culture was collected. She received Haldol 1 mg this morning. She received one dose of Rocephin today.   On interview, Carolyn Parrish is nonsensical with vocal outbursts throughout interview. She reports, "Carolyn Parrish ain't right."  History was obtained from her daughter at bedside.  Her daughter reports that this is her typical presentation when she has a UTI although she has been more agitated.  She reports that she noticed that Friday she was "snappy."  Her mood did not seem improved when she went to visit her on Friday.  She reports that on Monday she was told that her UA was unremarkable.  She is more disorganized today.  She has not been sleeping well since Sunday and her appetite has been poor since Monday.  She has scratches on her face which is likely due to pruritis from dry skin.  She has history of bipolar disorder although she has not been manic for several years.  She reports that she is usually hyperverbal and hypersexual in the setting of mania.  She was diagnosed with breast cancer in August and had a mastectomy in  September.  She was planning to start radiation today.  She was started on Anastrozole in November.  She has not had other medication changes.  She is normally able to have an intelligent conversation at baseline but does have slurred speech since she had a stroke in 1996.  She has been bedbound since 2016.  She recently moved to [facility] in October.  Past Psychiatric History: Bipolar disorder and schizoaffective disorder.   Risk to Self:   UTA due to AMS. She does not have a history of suicide attempts.  Risk to Others:   UTA due to AMS.   Prior Inpatient Therapy:  She was last hospitalized in the early 1990s.  Prior Outpatient Therapy:  She is followed by her outpatient provider.   Past Medical History:  Past Medical History:  Diagnosis Date  . Cancer (HCC)    BREAST CANCER  . CHF (congestive heart failure) (HCC)   . Diabetes mellitus without complication (HCC)   . Hyperlipidemia   . Hypertension   . Schizoaffective disorder (HCC)   . Stroke (HCC)     Past Surgical History:  Procedure Laterality Date  . CHOLECYSTECTOMY     20 16  . MASTECTOMY COMPLETE / SIMPLE Left 10/30/2017  . SIMPLE MASTECTOMY WITH AXILLARY SENTINEL NODE BIOPSY Left 10/30/2017   Procedure: LEFT SIMPLE MASTECTOMY;  Surgeon: Erroll Luna, MD;  Location: [place];  Service: General;  Laterality: Left;   Family History: No family history on file. Family Psychiatric  History: Son-attempted suicide.  Social History:  Social History   Substance and Sexual Activity  Alcohol Use Not Currently     Social History   Substance and Sexual Activity  Drug Use Never    Social History   Socioeconomic History  . Marital status: Widowed    Spouse name: Not on file  . Number of children: Not on file  . Years of education: Not on file  . Highest education level: Not on file  Occupational History  . Not on file  Social Needs  . Financial resource strain: Not on file  . Food insecurity:    Worry: Not on file     Inability: Not on file  . Transportation needs:    Medical: Not on file    Non-medical: Not on file  Tobacco Use  . Smoking status: Never Smoker  . Smokeless tobacco: Never Used  Substance and Sexual Activity  . Alcohol use: Not Currently  . Drug use: Never  . Sexual activity: Not on file  Lifestyle  . Physical activity:    Days per week: Not on file    Minutes per session: Not on file  . Stress: Not on file  Relationships  . Social connections:    Talks on phone: Not on file    Gets together: Not on file    Attends religious service: Not on file    Active member of club or organization: Not on file    Attends meetings of clubs or organizations: Not on file    Relationship status: Not on file  Other Topics Concern  . Not on file  Social History Narrative  . Not on file   Additional Social History: She resides at [facility]. She has an adult daughter and son. She does not use alcohol or illicit drugs.     Allergies:   Allergies  Allergen Reactions  . Atorvastatin     UNSPECIFIED REACTION     Labs:  Results for orders placed or performed during the hospital encounter of 01/07/18 (from the past 48 hour(s))  CBG monitoring, ED     Status: Abnormal   Collection Time: 01/08/18 12:04 AM  Result Value Ref Range   Glucose-Capillary 103 (H) 70 - 99 mg/dL  Urinalysis, Routine w reflex microscopic     Status: Abnormal   Collection Time: 01/08/18 12:12 AM  Result Value Ref Range   Color, Urine YELLOW YELLOW   APPearance CLEAR CLEAR   Specific Gravity, Urine 1.020 1.005 - 1.030   pH 5.0 5.0 - 8.0   Glucose, UA NEGATIVE NEGATIVE mg/dL   Hgb urine dipstick NEGATIVE NEGATIVE   Bilirubin Urine NEGATIVE NEGATIVE   Ketones, ur 20 (A) NEGATIVE mg/dL   Protein, ur NEGATIVE NEGATIVE mg/dL   Nitrite NEGATIVE NEGATIVE   Leukocytes, UA SMALL (A) NEGATIVE   RBC / HPF 6-10 0 - 5 RBC/hpf   WBC, UA 11-20 0 - 5 WBC/hpf   Bacteria, UA NONE SEEN NONE SEEN   Squamous Epithelial  / LPF 0-5 0 - 5   Mucus PRESENT     Comment: Performed at [facility], [address] [place], [place] [address]  Rapid urine drug screen (hospital performed)     Status: None   Collection Time: 01/08/18 12:12 AM  Result Value Ref Range   Opiates NONE DETECTED NONE DETECTED   Cocaine NONE DETECTED NONE DETECTED   Benzodiazepines NONE DETECTED NONE DETECTED   Amphetamines NONE DETECTED NONE DETECTED   Tetrahydrocannabinol NONE DETECTED NONE  DETECTED   Barbiturates NONE DETECTED NONE DETECTED    Comment: (NOTE) DRUG SCREEN FOR MEDICAL PURPOSES ONLY.  IF CONFIRMATION IS NEEDED FOR ANY PURPOSE, NOTIFY LAB WITHIN 5 DAYS. LOWEST DETECTABLE LIMITS FOR URINE DRUG SCREEN Drug Class                     Cutoff (ng/mL) Amphetamine and metabolites    1000 Barbiturate and metabolites    200 Benzodiazepine                 099 Tricyclics and metabolites     300 Opiates and metabolites        300 Cocaine and metabolites        300 THC                            50 Performed at [facility], [place] [address], [place], [place] [address]   CBC with Differential/Platelet     Status: Abnormal   Collection Time: 01/08/18 12:15 AM  Result Value Ref Range   WBC 14.7 (H) 4.0 - 10.5 K/uL   RBC 4.82 3.87 - 5.11 MIL/uL   Hemoglobin 14.0 12.0 - 15.0 g/dL   HCT 45.9 36.0 - 46.0 %   MCV 95.2 80.0 - 100.0 fL   MCH 29.0 26.0 - 34.0 pg   MCHC 30.5 30.0 - 36.0 g/dL   RDW 15.7 (H) 11.5 - 15.5 %   Platelets 302 150 - 400 K/uL   nRBC 0.0 0.0 - 0.2 %   Neutrophils Relative % 58 %   Neutro Abs 8.4 (H) 1.7 - 7.7 K/uL   Lymphocytes Relative 32 %   Lymphs Abs 4.7 (H) 0.7 - 4.0 K/uL   Monocytes Relative 9 %   Monocytes Absolute 1.3 (H) 0.1 - 1.0 K/uL   Eosinophils Relative 1 %   Eosinophils Absolute 0.2 0.0 - 0.5 K/uL   Basophils Relative 0 %   Basophils Absolute 0.1 0.0 - 0.1 K/uL   Immature Granulocytes 0 %   Abs Immature Granulocytes 0.05 0.00 - 0.07 K/uL    Comment: Performed at [facility], [place] [address] [place], [place] [address]  Comprehensive metabolic panel     Status: Abnormal   Collection Time: 01/08/18 12:15 AM  Result Value Ref Range   Sodium 141 135 - 145 mmol/L   Potassium 3.7 3.5 - 5.1 mmol/L   Chloride 102 98 - 111 mmol/L   CO2 24 22 - 32 mmol/L   Glucose, Bld 109 (H) 70 - 99 mg/dL   BUN 11 8 - 23 mg/dL   Creatinine, Ser 0.99 0.44 - 1.00 mg/dL   Calcium 9.9 8.9 - 10.3 mg/dL   Total Protein 7.8 6.5 - 8.1 g/dL   Albumin 3.9 3.5 - 5.0 g/dL   AST 21 15 - 41 U/L   ALT 23 0 - 44 U/L   Alkaline Phosphatase 64 38 - 126 U/L   Total Bilirubin 0.9 0.3 - 1.2 mg/dL   GFR calc non Af Amer 56 (L) >60 mL/min   GFR calc Af Amer >60 >60 mL/min   Anion gap 15 5 - 15    Comment: Performed at [facility] [address], [place], [place] [address]  Ammonia     Status: None   Collection Time: 01/08/18 12:15 AM  Result Value Ref Range   Ammonia 27 9 - 35 umol/L  Comment: Performed at [facility], [place] [address], [place], [place] [address]  Lipase, blood     Status: None   Collection Time: 01/08/18 12:15 AM  Result Value Ref Range   Lipase 23 11 - 51 U/L    Comment: Performed at [facility] [address], [place], [place] [address]  I-Stat CG4 Lactic Acid, ED     Status: None   Collection Time: 01/08/18 12:21 AM  Result Value Ref Range   Lactic Acid, Venous 1.45 0.5 - 1.9 mmol/L  Glucose, capillary     Status: Abnormal   Collection Time: 01/08/18 12:09 PM  Result Value Ref Range   Glucose-Capillary 119 (H) 70 - 99 mg/dL    Current Facility-Administered Medications  Medication Dose Route Frequency Provider Last Rate Last Dose  . 0.9 %  sodium chloride infusion   Intravenous Continuous Karmen Bongo, MD 100 mL/hr at 01/08/18 1135    . acetaminophen (TYLENOL) tablet 650 mg  650 mg Oral Q6H PRN Karmen Bongo, MD       Or  . acetaminophen (TYLENOL) suppository 650 mg  650 mg Rectal Q6H PRN Karmen Bongo, MD      . alum & mag  hydroxide-simeth (MAALOX/MYLANTA) 200-200-20 MG/5ML suspension 30 mL  30 mL Oral Q4H PRN Karmen Bongo, MD      . anastrozole (ARIMIDEX) tablet 1 mg  1 mg Oral Daily Karmen Bongo, MD   1 mg at 01/08/18 1141  . aspirin EC tablet 81 mg  81 mg Oral Daily Karmen Bongo, MD   81 mg at 01/08/18 1137  . bisacodyl (DULCOLAX) suppository 10 mg  10 mg Rectal PRN Karmen Bongo, MD      . divalproex (DEPAKOTE ER) 24 hr tablet 250 mg  250 mg Oral Daily Karmen Bongo, MD   250 mg at 01/08/18 1141  . docusate sodium (COLACE) capsule 100 mg  100 mg Oral BID Karmen Bongo, MD   100 mg at 01/08/18 1137  . enoxaparin (LOVENOX) injection 40 mg  40 mg Subcutaneous Q24H Karmen Bongo, MD      . insulin aspart (novoLOG) injection 0-15 Units  0-15 Units Subcutaneous TID WC Karmen Bongo, MD      . insulin aspart (novoLOG) injection 0-5 Units  0-5 Units Subcutaneous QHS Karmen Bongo, MD      . ondansetron [facility]) tablet 4 mg  4 mg Oral Q6H PRN Karmen Bongo, MD       Or  . ondansetron [facility]) injection 4 mg  4 mg Intravenous Q6H PRN Karmen Bongo, MD      . oxyCODONE (Oxy IR/ROXICODONE) immediate release tablet 5 mg  5 mg Oral Q4H PRN Karmen Bongo, MD      . polyethylene glycol (MIRALAX / GLYCOLAX) packet 17 g  17 g Oral Daily Karmen Bongo, MD   17 g at 01/08/18 1137    Musculoskeletal: Strength & Muscle Tone: Generalized weakness. Gait & Station: unable to stand Patient leans: N/A  Psychiatric Specialty Exam: Physical Exam  Nursing note and vitals reviewed. Constitutional: She appears well-developed and well-nourished.  HENT:  Head: Normocephalic and atraumatic.  Neck: Normal range of motion.  Respiratory: Effort normal.  Musculoskeletal: Normal range of motion.  Neurological: She is alert.  Oriented to self.  Psychiatric: Thought content normal. Her affect is labile. Her speech is tangential. She is agitated. Cognition and memory are impaired. She expresses impulsivity.     Review of Systems  Unable to perform ROS: Mental status  change    Blood pressure 139/79, pulse (!) 116, temperature 98.7 F (37.1 C), temperature source Rectal, resp. rate (!) 23, SpO2 95 %.There is no height or weight on file to calculate BMI.  General Appearance: Fairly Groomed, elderly, African American female, wearing a hospital gown with braided hair who is lying in bed. NAD.   Eye Contact:  Good  Speech:  Slurred  Volume:  Increased  Mood:  Irritable  Affect:  Congruent  Thought Process:  Disorganized and Descriptions of Associations: Tangential  Orientation:  Other:  Oriented to self.  Thought Content:  Tangential  Suicidal Thoughts:  No  Homicidal Thoughts:  No  Memory:  Immediate;   UTA due to AMS. Recent;   UTA due to AMS. Remote;   UTA due to AMS.  Judgement:  Impaired  Insight:  Lacking  Psychomotor Activity:  Increased  Concentration:  Concentration: Poor and Attention Span: Poor  Recall:  UTA due to AMS.  Fund of Knowledge:  Fair  Language:  Fair  Akathisia:  NA  Handed:  Right  AIMS (if indicated):   N/A  Assets:  Housing Social Support  ADL's:  Impaired  Cognition:  Impaired due to AMS.  Sleep:   N/A   Assessment:  Jo D Pytel is a 77 y.o. female who was admitted with AMS. She is nonsensical with vocal outbursts throughout interview. History was obtained from her daughter. She had an acute change in her mental status this past weekend. Her presentation is similar to when she has a UTI. UTI results were equivocal. Recommend Saphris for for agitation and hopefully patient will improve as medication condition improves. There is a concern by patient's daughter if Anastrozole is contributing to current presentation and discussed with primary team who will observe patient and then make further changes if needed. She does not warrant inpatient psychiatric hospitalization at this time.    Treatment Plan Summary: -Start Saphris 5 mg BID for agitation due to AMS.   -Continue Depakote 250 mg daily for mood stabilization.  -EKG reviewed and QTc 476 on 12/2. Please closely monitor when starting or increasing QTc prolonging agents.  -Psychiatry will sign off on patient at this time. Please consult psychiatry again as needed.   Disposition: Patient does not meet criteria for psychiatric inpatient admission.  Faythe Dingwall, DO 01/08/2018 12:53 PM

## 2018-01-08 NOTE — ED Notes (Signed)
Call pt daughter Anber Mckiver for any info needed and to give update [phone]

## 2018-01-08 NOTE — ED Notes (Signed)
Called staffing for sitter

## 2018-01-08 NOTE — H&P (Signed)
History and Physical    Carolyn Parrish HYQ:[id number] DOB: 26-Jun-1940 DOA: 01/07/2018  PCP: Hendricks Limes, MD Consultants:  Magrinat - oncology; Lisbeth Renshaw - rad onc; Palliative care; Cornett - surgery Patient coming from: [place]; NOK: Daughter, Carolyn Parrish, [phone]  Chief Complaint: AMS  HPI: Carolyn Parrish is a 77 y.o. 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  There was no family present and the patient was unable to provide history.  Her daughter is concerned about a UTI - this has been the cause of AMS in the past.  The only recent med change has been Arimidex.    ED Course:  Carryover, per Dr. Myna Hidalgo:  78 yof with hx of psychosis, presents from SNF with several days progressive AMS. Urine does not look infected, but reportedly had similar presentation with similar UA where she ended up septic from UTI. Treated empirically with Rocephin.   Review of Systems: Unable to perform  PMH, PSH, SH, and FH were reviewed in Epic  Past Medical History:  Diagnosis Date  . Cancer (HCC)    BREAST CANCER  . CHF (congestive heart failure) ([place])   . Diabetes mellitus without complication ([place])   . Hyperlipidemia   . Hypertension   . Schizoaffective disorder ([place])   . Stroke [facility]     Past Surgical History:  Procedure Laterality Date  . CHOLECYSTECTOMY     2016  . MASTECTOMY COMPLETE / SIMPLE Left 10/30/2017  . SIMPLE MASTECTOMY WITH AXILLARY SENTINEL NODE BIOPSY Left 10/30/2017   Procedure: LEFT SIMPLE MASTECTOMY;  Surgeon: Erroll Luna, MD;  Location: [facility];  Service: General;  Laterality: Left;    Social History   Socioeconomic History  . Marital status: Widowed    Spouse name: Not on file  . Number of children: Not on file  . Years of education: Not on file  . Highest education level: Not on file    Occupational History  . Not on file  Social Needs  . Financial resource strain: Not on file  . Food insecurity:    Worry: Not on file    Inability: Not on file  . Transportation needs:    Medical: Not on file    Non-medical: Not on file  Tobacco Use  . Smoking status: Never Smoker  . Smokeless tobacco: Never Used  Substance and Sexual Activity  . Alcohol use: Not Currently  . Drug use: Never  . Sexual activity: Not on file  Lifestyle  . Physical activity:    Days per week: Not on file    Minutes per session: Not on file  . Stress: Not on file  Relationships  . Social connections:    Talks on phone: Not on file    Gets together: Not on file    Attends religious service: Not on file    Active member of club or organization: Not on file    Attends meetings of clubs or organizations: Not on file    Relationship status: Not on file  . Intimate partner violence:    Fear of current or ex partner: Not on file    Emotionally abused: Not on file    Physically abused: Not on file    Forced sexual activity: Not on file  Other Topics Concern  . Not on file  Social History Narrative  . Not on file  Allergies  Allergen Reactions  . Atorvastatin     UNSPECIFIED REACTION     No family history on file.  Prior to Admission medications   Medication Sig Start Date End Date Taking? Authorizing Provider  acetaminophen (TYLENOL) 325 MG tablet Take 325-650 mg by mouth every 4 (four) hours as needed (650mg  for arthritis pain, 325mg  for fever >101).    Yes [provider]  alum & mag hydroxide-simeth (MYLANTA) 200-200-20 MG/5ML suspension Take 30 mLs by mouth every 4 (four) hours as needed for indigestion or heartburn.   Yes [provider]  anastrozole (ARIMIDEX) 1 MG tablet Take 1 tablet (1 mg total) by mouth daily. 11/23/17  Yes Magrinat, Virgie Dad, MD  aspirin EC 81 MG tablet Take 81 mg by mouth daily.   Yes [provider]  bisacodyl (DULCOLAX) 10 MG  suppository Place 10 mg rectally as needed for moderate constipation.   Yes [provider]  camphor-menthol Timoteo Ace) lotion Apply 1 application topically 2 (two) times daily as needed for itching.   Yes [provider]  divalproex (DEPAKOTE ER) 250 MG 24 hr tablet Take 250 mg by mouth daily.   Yes [provider]  docusate sodium (COLACE) 100 MG capsule Take 100 mg by mouth 2 (two) times daily.   Yes [provider]  metFORMIN (GLUCOPHAGE) 500 MG tablet Take 500 mg by mouth daily.   Yes [provider]  oxyCODONE (OXY IR/ROXICODONE) 5 MG immediate release tablet Take 1 tablet (5 mg total) by mouth every 4 (four) hours as needed for moderate pain. Patient taking differently: Take 5-10 mg by mouth every 4 (four) hours as needed for moderate pain.  10/31/17  Yes Rayburn, Claiborne Billings A, PA-C  polyethylene glycol (MIRALAX / GLYCOLAX) packet Take 17 g by mouth daily.   Yes [provider]  Sodium Phosphates (RA SALINE ENEMA) 19-7 GM/118ML ENEM Place 1 each rectally as needed (for constipation).   Yes [provider]    Physical Exam: Vitals:   01/08/18 1026 01/08/18 1314 01/08/18 1321 01/08/18 1657  BP: 139/79 139/79  (!) 156/66  Pulse: (!) 116 (!) 116  (!) 106  Resp:  (!) 23    Temp:  98.7 F (37.1 C)  98.1 F (36.7 C)  TempSrc:  Oral  Oral  SpO2: 95%   96%  Weight:   97.6 kg   Height:  5\' 10"  (1.778 m)       General: Patient was alert but quite agitated.  She was somewhat violent with attempts to swing at me.  With time and patience, she allowed me to perform a limited exam. Eyes:  PERRL, EOMI, normal lids, iris - closed her eyes tightly after I shined the light in them and refused to open them for up to a minute ENT:  grossly normal hearing, refused to open mouth Neck:  no LAD, masses or thyromegaly; she got very agitated with neck palpation Cardiovascular:  Tachycardia, no m/r/g. No LE edema.  Respiratory:   CTA bilaterally with no  wheezes/rales/rhonchi.  Normal respiratory effort. Abdomen:  soft, NT, ND, NABS Skin:  no rash or induration seen on limited exam Musculoskeletal:  Left-sided deficits were difficult to assess given her unwillingness to be examined, but she did have powerful attempts at hitting me Lower extremity:  No LE edema.  2+ distal pulses. Psychiatric: agitated but awake and alert.  Somewhat redirectable but did not follow instructions Neurologic: unable to assess    Radiological Exams on Admission:  Ct Head Wo Contrast  Result Date: 01/08/2018 CLINICAL DATA:  77 year old female with altered mental status. EXAM: CT HEAD WITHOUT CONTRAST TECHNIQUE: Contiguous axial images were obtained from the base of the skull through the vertex without intravenous contrast. COMPARISON:  None. FINDINGS: Brain: The ventricles and sulci appropriate size for patient's age. Mild periventricular and deep white matter chronic microvascular ischemic changes noted. There is no acute intracranial hemorrhage. No mass effect or midline shift. No extra-axial fluid collection. There is mild cerebellar atrophy. Vascular: No hyperdense vessel or unexpected calcification. Skull: Normal. Negative for fracture or focal lesion. Sinuses/Orbits: No acute finding. Other: None IMPRESSION: 1. No acute intracranial hemorrhage. 2. Mild age-related atrophy and chronic microvascular ischemic changes. Electronically Signed   By: Anner Crete M.D.   On: 01/08/2018 00:40   Ct Abdomen Pelvis W Contrast  Result Date: 01/08/2018 CLINICAL DATA:  77 year old female with generalized abdominal pain. EXAM: CT ABDOMEN AND PELVIS WITH CONTRAST TECHNIQUE: Multidetector CT imaging of the abdomen and pelvis was performed using the standard protocol following bolus administration of intravenous contrast. CONTRAST:  161mL OMNIPAQUE IOHEXOL 300 MG/ML  SOLN COMPARISON:  None. FINDINGS: Evaluation of this exam is limited due to respiratory motion artifact. Lower chest:  There bibasilar dependent atelectasis of the visualized lung bases. No intra-abdominal free air or free fluid. Hepatobiliary: Apparent fatty infiltration of the liver. No intrahepatic biliary ductal dilatation. Cholecystectomy. A biliary stent is noted in the CBD. Pancreas: Unremarkable. No pancreatic ductal dilatation or surrounding inflammatory changes. Spleen: Normal in size without focal abnormality. Adrenals/Urinary Tract: Adrenal glands are unremarkable. Kidneys are normal, without renal calculi, focal lesion, or hydronephrosis. Bladder is unremarkable. Stomach/Bowel: There is redundancy of the sigmoid colon. There are scattered colonic diverticula without active inflammatory changes. Mild gaseous distention of the colon. There is no bowel obstruction or active inflammation. Normal appendix. Vascular/Lymphatic: Mild aortoiliac atherosclerotic disease. No portal venous gas. There is no adenopathy. Reproductive: The uterus is anteverted and grossly unremarkable. The ovaries are grossly unremarkable as visualized. Other: None Musculoskeletal: Degenerative changes of the spine. No acute osseous pathology. IMPRESSION: 1. No acute intra-abdominal or pelvic pathology. Mild gaseous distention of the colon. No bowel obstruction or active inflammation. Normal appendix. 2. Cholecystectomy with CBD stent in place. No intrahepatic biliary ductal dilatation. 3. Fatty liver. Electronically Signed   By: Anner Crete M.D.   On: 01/08/2018 04:21   Dg Chest Port 1 View  Result Date: 01/08/2018 CLINICAL DATA:  Altered mental status, shortness of breath. History of breast cancer. EXAM: PORTABLE CHEST 1 VIEW COMPARISON:  Chest radiograph November 14, 2017 FINDINGS: Cardiac silhouette is mildly enlarged and unchanged. Similar fullness of the mediastinum, potentially a projectional in this low inspiratory portable examination with crowded vascular markings, no pleural effusion or focal consolidation. No pneumothorax. Soft  tissue planes and included osseous structures are nonacute. IMPRESSION: Mild cardiomegaly. No acute pulmonary process in this low inspiratory examination. Electronically Signed   By: Elon Alas M.D.   On: 01/08/2018 00:26    EKG: Independently reviewed.  Sinus tachycardia with rate 109; nonspecific ST changes with no evidence of acute ischemia   Labs on Admission: I have personally reviewed the available labs and imaging studies at the time of the admission.  Pertinent labs:   Glucose 109 Otherwise unremarkable CMP Lactate 1.45 WBC 14.7 UA: 20 ketones, small LE, otherwise WNL UDS negative  Assessment/Plan Principal Problem:   Acute encephalopathy Active Problems:   Schizoaffective disorder (HCC)   Breast cancer, stage 2,  left ([place])   Diabetes mellitus treated with oral medication ([place])   Acute encephalopathy -Uncertain baseline, has known past CVA as well as psychiatric illness -She was quite agitated and somewhat violent at the time of my evaluation; this has reportedly improved somewhat and she is more comfortable now -Evaluation thus far unremarkable other than leukocytosis and apparent dehydration on UA -While initial concern was for infection, specifically UTI, and so she was given Rocephin empirically, there is no other evidence of infection at this time and her UA is unremarkable -She does appear to be mildly dehydrated, as evidenced by her ketonuria, but she does not have renal failure at this time -Based on unremarkable evaluation with current ability to protect her airway, will observe for now on med surg with IVF hydration and continued use of Rocephin pending urine culture; repeat labs in AM -Psych consult was also requested and Dr. Mariea Clonts recommends Saphris for agitation.   -Dr. Mariea Clonts also raised the concern for Arimidex as the cause, could consider trial of alternative medication as an outpatient; while encephalopathy is not a reported risk, depression is and so  this is a reasonable consideration.  If not improving, this could also be held.  Breast cancer -On Arimidex; could be held, as above  DM -A1x was 6.3 in 9/19 -hold Glucophage -Cover with moderate-scale SSI     DVT prophylaxis: Lovenox  Code Status:  Full - confirmed with bedside paperwork and prior palliative care note Family Communication: None present  Disposition Plan:  Home once clinically improved Consults called: Psychiatry  Admission status: It is my clinical opinion that referral for OBSERVATION is reasonable and necessary in this patient based on the above information provided. The aforementioned taken together are felt to place the patient at high risk for further clinical deterioration. However it is anticipated that the patient may be medically stable for discharge from the hospital within 24 to 48 hours.    Karmen Bongo MD [facility]  If note is complete, please contact covering daytime or nighttime physician. [email or web address] Password [email or web address]  01/08/2018, 5:20 PM

## 2018-01-08 NOTE — ED Provider Notes (Signed)
[facility] EMERGENCY DEPARTMENT Provider Note   CSN: [id number] Arrival date & time: 01/07/18  2352     History   Chief Complaint Chief Complaint  Patient presents with  . Altered Mental Status    HPI Carolyn Parrish is a 77 y.o. female.  Patient presents to the emergency department for evaluation of altered mental status.  Patient sent to the emergency department from a nursing home after staff noted change in her behavior.  The staff that saw her tonight had not seen her since last week, however.  There is no other information available.  EMS reports that upon their arrival the patient was experiencing intermittent jerking motions and unable to answer questions.  EMS report that nothing she said made any sense, but during the transport and at arrival to the ER she is starting to answer some questions appropriately.  Patient reports that she has abdominal pain currently.     Past Medical History:  Diagnosis Date  . Cancer (HCC)    BREAST CANCER  . CHF (congestive heart failure) ([place])   . Diabetes mellitus without complication ([place])   . Hyperlipidemia   . Hypertension   . Schizoaffective disorder ([place])   . Stroke [facility])     Patient Active Problem List   Diagnosis Date Noted  . Dehydration   . Sepsis ([place]) 11/14/2017  . Septic shock ([place]) 11/14/2017  . AKI (acute kidney injury) ([place])   . Diabetes mellitus treated with oral medication ([place])   . Lactic acidosis   . Bipolar disorder ([place])   . History of completed stroke   . Breast cancer, stage 2, left ([place]) 10/30/2017  . Encounter for central line placement 09/12/2017  . CVA (cerebrovascular accident) ([place]) 09/12/2017  . Diabetes mellitus type 2, controlled, without complications ([place]) 02/58/5277  . Schizoaffective disorder ([place]) 09/12/2017  . Malignant neoplasm of upper-inner quadrant of left breast in female, estrogen receptor positive ([place]) 09/07/2017    Past Surgical History:  Procedure  Laterality Date  . CHOLECYSTECTOMY     2016  . MASTECTOMY COMPLETE / SIMPLE Left 10/30/2017  . SIMPLE MASTECTOMY WITH AXILLARY SENTINEL NODE BIOPSY Left 10/30/2017   Procedure: LEFT SIMPLE MASTECTOMY;  Surgeon: Erroll Luna, MD;  Location: [facility];  Service: General;  Laterality: Left;     OB History   None      Home Medications    Prior to Admission medications   Medication Sig Start Date End Date Taking? Authorizing Provider  acetaminophen (TYLENOL) 325 MG tablet Take 325-650 mg by mouth every 4 (four) hours as needed (650mg  for arthritis pain, 325mg  for fever >101).    Yes [provider]  alum & mag hydroxide-simeth (MYLANTA) 200-200-20 MG/5ML suspension Take 30 mLs by mouth every 4 (four) hours as needed for indigestion or heartburn.   Yes [provider]  anastrozole (ARIMIDEX) 1 MG tablet Take 1 tablet (1 mg total) by mouth daily. 11/23/17  Yes Magrinat, Virgie Dad, MD  aspirin EC 81 MG tablet Take 81 mg by mouth daily.   Yes [provider]  bisacodyl (DULCOLAX) 10 MG suppository Place 10 mg rectally as needed for moderate constipation.   Yes [provider]  camphor-menthol Timoteo Ace) lotion Apply 1 application topically 2 (two) times daily as needed for itching.   Yes [provider]  divalproex (DEPAKOTE ER) 250 MG 24 hr tablet Take 250 mg by mouth daily.   Yes [provider]  docusate sodium (COLACE) 100 MG  capsule Take 100 mg by mouth 2 (two) times daily.   Yes [provider]  metFORMIN (GLUCOPHAGE) 500 MG tablet Take 500 mg by mouth daily.   Yes [provider]  oxyCODONE (OXY IR/ROXICODONE) 5 MG immediate release tablet Take 1 tablet (5 mg total) by mouth every 4 (four) hours as needed for moderate pain. Patient taking differently: Take 5-10 mg by mouth every 4 (four) hours as needed for moderate pain.  10/31/17  Yes Rayburn, Claiborne Billings A, PA-C  polyethylene glycol (MIRALAX / GLYCOLAX) packet Take 17 g by mouth  daily.   Yes [provider]  Sodium Phosphates (RA SALINE ENEMA) 19-7 GM/118ML ENEM Place 1 each rectally as needed (for constipation).   Yes [provider]    Family History No family history on file.  Social History Social History   Tobacco Use  . Smoking status: Never Smoker  . Smokeless tobacco: Never Used  Substance Use Topics  . Alcohol use: Not Currently  . Drug use: Never     Allergies   Atorvastatin   Review of Systems Review of Systems  Unable to perform ROS: Mental status change     Physical Exam Updated Vital Signs BP 139/76   Pulse (!) 119   Temp 98.7 F (37.1 C) (Rectal)   Resp (!) 27   SpO2 98%   Physical Exam  Constitutional: She appears well-developed and well-nourished. No distress.  HENT:  Head: Normocephalic and atraumatic.  Right Ear: Hearing normal.  Left Ear: Hearing normal.  Nose: Nose normal.  Mouth/Throat: Oropharynx is clear and moist and mucous membranes are normal.  Eyes: Pupils are equal, round, and reactive to light. Conjunctivae and EOM are normal.  Neck: Normal range of motion. Neck supple.  Cardiovascular: Regular rhythm, S1 normal and S2 normal. Exam reveals no gallop and no friction rub.  No murmur heard. Pulmonary/Chest: Effort normal and breath sounds normal. No respiratory distress. She exhibits no tenderness.  Abdominal: Soft. Normal appearance. She exhibits distension. There is no hepatosplenomegaly. There is generalized tenderness. There is no rebound, no guarding, no tenderness at McBurney's point and negative Murphy's sign. No hernia.  Musculoskeletal: Normal range of motion.  Neurological: She is alert. She has normal strength. She is disoriented. No cranial nerve deficit or sensory deficit. Coordination normal. GCS eye subscore is 4. GCS verbal subscore is 4. GCS motor subscore is 6.  answers some questions but intermittently becomes agitated, confused, talking to herself.  Skin: Skin is warm, dry  and intact. No rash noted. No cyanosis.  Nursing note and vitals reviewed.    ED Treatments / Results  Labs (all labs ordered are listed, but only abnormal results are displayed) Labs Reviewed  CBC WITH DIFFERENTIAL/PLATELET - Abnormal; Notable for the following components:      Result Value   WBC 14.7 (*)    RDW 15.7 (*)    Neutro Abs 8.4 (*)    Lymphs Abs 4.7 (*)    Monocytes Absolute 1.3 (*)    All other components within normal limits  COMPREHENSIVE METABOLIC PANEL - Abnormal; Notable for the following components:   Glucose, Bld 109 (*)    GFR calc non Af Amer 56 (*)    All other components within normal limits  URINALYSIS, ROUTINE W REFLEX MICROSCOPIC - Abnormal; Notable for the following components:   Ketones, ur 20 (*)    Leukocytes, UA SMALL (*)    All other components within normal limits  CBG MONITORING, ED - Abnormal;  Notable for the following components:   Glucose-Capillary 103 (*)    All other components within normal limits  URINE CULTURE  RAPID URINE DRUG SCREEN, HOSP PERFORMED  AMMONIA  LIPASE, BLOOD  I-STAT CG4 LACTIC ACID, ED    EKG EKG Interpretation  Date/Time:  Monday January 07 2018 23:58:36 EST Ventricular Rate:  109 PR Interval:    QRS Duration: 131 QT Interval:  353 QTC Calculation: 476 R Axis:   0 Text Interpretation:  Sinus tachycardia Probable left ventricular hypertrophy Nonspecific T abnormalities, diffuse leads Confirmed by Orpah Greek [phone]) on 01/08/2018 12:07:49 AM   Radiology Ct Head Wo Contrast  Result Date: 01/08/2018 CLINICAL DATA:  77 year old female with altered mental status. EXAM: CT HEAD WITHOUT CONTRAST TECHNIQUE: Contiguous axial images were obtained from the base of the skull through the vertex without intravenous contrast. COMPARISON:  None. FINDINGS: Brain: The ventricles and sulci appropriate size for patient's age. Mild periventricular and deep white matter chronic microvascular ischemic changes noted.  There is no acute intracranial hemorrhage. No mass effect or midline shift. No extra-axial fluid collection. There is mild cerebellar atrophy. Vascular: No hyperdense vessel or unexpected calcification. Skull: Normal. Negative for fracture or focal lesion. Sinuses/Orbits: No acute finding. Other: None IMPRESSION: 1. No acute intracranial hemorrhage. 2. Mild age-related atrophy and chronic microvascular ischemic changes. Electronically Signed   By: Anner Crete M.D.   On: 01/08/2018 00:40   Ct Abdomen Pelvis W Contrast  Result Date: 01/08/2018 CLINICAL DATA:  77 year old female with generalized abdominal pain. EXAM: CT ABDOMEN AND PELVIS WITH CONTRAST TECHNIQUE: Multidetector CT imaging of the abdomen and pelvis was performed using the standard protocol following bolus administration of intravenous contrast. CONTRAST:  1101mL OMNIPAQUE IOHEXOL 300 MG/ML  SOLN COMPARISON:  None. FINDINGS: Evaluation of this exam is limited due to respiratory motion artifact. Lower chest: There bibasilar dependent atelectasis of the visualized lung bases. No intra-abdominal free air or free fluid. Hepatobiliary: Apparent fatty infiltration of the liver. No intrahepatic biliary ductal dilatation. Cholecystectomy. A biliary stent is noted in the CBD. Pancreas: Unremarkable. No pancreatic ductal dilatation or surrounding inflammatory changes. Spleen: Normal in size without focal abnormality. Adrenals/Urinary Tract: Adrenal glands are unremarkable. Kidneys are normal, without renal calculi, focal lesion, or hydronephrosis. Bladder is unremarkable. Stomach/Bowel: There is redundancy of the sigmoid colon. There are scattered colonic diverticula without active inflammatory changes. Mild gaseous distention of the colon. There is no bowel obstruction or active inflammation. Normal appendix. Vascular/Lymphatic: Mild aortoiliac atherosclerotic disease. No portal venous gas. There is no adenopathy. Reproductive: The uterus is anteverted and  grossly unremarkable. The ovaries are grossly unremarkable as visualized. Other: None Musculoskeletal: Degenerative changes of the spine. No acute osseous pathology. IMPRESSION: 1. No acute intra-abdominal or pelvic pathology. Mild gaseous distention of the colon. No bowel obstruction or active inflammation. Normal appendix. 2. Cholecystectomy with CBD stent in place. No intrahepatic biliary ductal dilatation. 3. Fatty liver. Electronically Signed   By: Anner Crete M.D.   On: 01/08/2018 04:21   Dg Chest Port 1 View  Result Date: 01/08/2018 CLINICAL DATA:  Altered mental status, shortness of breath. History of breast cancer. EXAM: PORTABLE CHEST 1 VIEW COMPARISON:  Chest radiograph November 14, 2017 FINDINGS: Cardiac silhouette is mildly enlarged and unchanged. Similar fullness of the mediastinum, potentially a projectional in this low inspiratory portable examination with crowded vascular markings, no pleural effusion or focal consolidation. No pneumothorax. Soft tissue planes and included osseous structures are nonacute. IMPRESSION: Mild cardiomegaly. No acute pulmonary  process in this low inspiratory examination. Electronically Signed   By: Elon Alas M.D.   On: 01/08/2018 00:26    Procedures Procedures (including critical care time)  Medications Ordered in ED Medications  cefTRIAXone (ROCEPHIN) 1 g in sodium chloride 0.9 % 100 mL IVPB (0 g Intravenous Stopped 01/08/18 0331)  iohexol (OMNIPAQUE) 300 MG/ML solution 100 mL (100 mLs Intravenous Contrast Given 01/08/18 0342)     Initial Impression / Assessment and Plan / ED Course  I have reviewed the triage vital signs and the nursing notes.  Pertinent labs & imaging results that were available during my care of the patient were reviewed by me and considered in my medical decision making (see chart for details).     Patient sent to the emergency department for altered mental status.  Patient sent from nursing home.  At arrival to the  ER she is alert but confused.  She does seem somewhat agitated.  She at times yells out random words and appears to be having a conversation with herself in the room.  She did complain of abdominal pain.  CT abdomen and pelvis did not show any abnormal findings.  Patient had a head CT which was unremarkable.  She is mildly tachycardic but not hypotensive.  She does not have a fever including rectal temp here in the ER.  Urinalysis is equivocal for infection.  Did call her daughter Oris Drone at home.  Daughter is very knowledgeable about the patient's history.  She does have some mental health history.  She takes Depakote for bipolar disorder and has had some manic episodes, but daughter feels that this is more consistent with episodes she has had with urinary tract infections.  She was hospitalized in October for urinary tract infection resulting in sepsis.  Based on her vital signs and presentation with the findings today I have to be concerned that she might have a urinary tract infection again.  She is too altered to return to the nursing home.  Urine culture has been sent, initiated Rocephin.  Will admit for monitoring and further management.  Final Clinical Impressions(s) / ED Diagnoses   Final diagnoses:  Acute encephalopathy  Urinary tract infection without hematuria, site unspecified    ED Discharge Orders    None       Orpah Greek, MD 01/08/18 [phone]

## 2018-01-08 NOTE — ED Notes (Signed)
Paged dr opyd for [employer]

## 2018-01-08 NOTE — Telephone Encounter (Signed)
This RN spoke with pt's daughter per call with concern of mental status changes with pt now being admitted to [facility].  Carolyn Parrish wanted to inquire if the anastrozole could attribute to the above.  This RN informed daughter anastrozole is not known to cause above issues- but possible stress ( physical ) of breast cancer diagnosis with surgery and noted prior infection.  MD made aware of the above.

## 2018-01-09 ENCOUNTER — Encounter (HOSPITAL_COMMUNITY): Payer: Self-pay

## 2018-01-09 ENCOUNTER — Ambulatory Visit: Payer: Medicare Other

## 2018-01-09 DIAGNOSIS — G9340 Encephalopathy, unspecified: Secondary | ICD-10-CM | POA: Diagnosis not present

## 2018-01-09 DIAGNOSIS — Z170 Estrogen receptor positive status [ER+]: Secondary | ICD-10-CM | POA: Diagnosis not present

## 2018-01-09 DIAGNOSIS — Z8673 Personal history of transient ischemic attack (TIA), and cerebral infarction without residual deficits: Secondary | ICD-10-CM | POA: Diagnosis not present

## 2018-01-09 DIAGNOSIS — C50412 Malignant neoplasm of upper-outer quadrant of left female breast: Secondary | ICD-10-CM | POA: Diagnosis not present

## 2018-01-09 DIAGNOSIS — E870 Hyperosmolality and hypernatremia: Secondary | ICD-10-CM | POA: Diagnosis present

## 2018-01-09 DIAGNOSIS — F259 Schizoaffective disorder, unspecified: Secondary | ICD-10-CM

## 2018-01-09 LAB — BASIC METABOLIC PANEL
Anion gap: 18 — ABNORMAL HIGH (ref 5–15)
BUN: 11 mg/dL (ref 8–23)
CO2: 21 mmol/L — ABNORMAL LOW (ref 22–32)
Calcium: 9.3 mg/dL (ref 8.9–10.3)
Chloride: 108 mmol/L (ref 98–111)
Creatinine, Ser: 1.07 mg/dL — ABNORMAL HIGH (ref 0.44–1.00)
GFR calc Af Amer: 59 mL/min — ABNORMAL LOW (ref >60)
GFR calc non Af Amer: 51 mL/min — ABNORMAL LOW (ref >60)
Glucose, Bld: 98 mg/dL (ref 70–99)
Potassium: 4 mmol/L (ref 3.5–5.1)
SODIUM: 147 mmol/L — AB (ref 135–145)

## 2018-01-09 LAB — CBC
HCT: 42.8 % (ref 36.0–46.0)
Hemoglobin: 12.5 g/dL (ref 12.0–15.0)
MCH: 28.5 pg (ref 26.0–34.0)
MCHC: 29.2 g/dL — ABNORMAL LOW (ref 30.0–36.0)
MCV: 97.7 fL (ref 80.0–100.0)
Platelets: 323 10*3/uL (ref 150–400)
RBC: 4.38 MIL/uL (ref 3.87–5.11)
RDW: 15.9 % — ABNORMAL HIGH (ref 11.5–15.5)
WBC: 16.2 10*3/uL — ABNORMAL HIGH (ref 4.0–10.5)
nRBC: 0 % (ref 0.0–0.2)

## 2018-01-09 LAB — URINE CULTURE: Culture: NO GROWTH

## 2018-01-09 LAB — GLUCOSE, CAPILLARY
Glucose-Capillary: 85 mg/dL (ref 70–99)
Glucose-Capillary: 89 mg/dL (ref 70–99)
Glucose-Capillary: 111 mg/dL — ABNORMAL HIGH (ref 70–99)
Glucose-Capillary: 94 mg/dL (ref 70–99)

## 2018-01-09 MED ORDER — ASENAPINE MALEATE 5 MG SL SUBL
5.0000 mg | SUBLINGUAL_TABLET | Freq: Two times a day (BID) | SUBLINGUAL | Status: DC
  Filled 2018-01-09: qty 1

## 2018-01-09 MED ORDER — QUETIAPINE FUMARATE 25 MG PO TABS
12.5000 mg | ORAL_TABLET | Freq: Every day | ORAL | Status: DC
  Administered 2018-01-09: 12.5 mg via ORAL
  Filled 2018-01-09: qty 1

## 2018-01-09 MED ORDER — LACTATED RINGERS IV SOLN
INTRAVENOUS | Status: DC
  Administered 2018-01-09 – 2018-01-10 (×2): via INTRAVENOUS

## 2018-01-09 MED ORDER — OLANZAPINE 5 MG PO TABS
5.0000 mg | ORAL_TABLET | Freq: Two times a day (BID) | ORAL | Status: DC
  Administered 2018-01-09 – 2018-01-10 (×2): 5 mg via ORAL
  Filled 2018-01-09 (×2): qty 1

## 2018-01-09 MED ORDER — HALOPERIDOL LACTATE 5 MG/ML IJ SOLN: 2.0000 mg | Freq: Four times a day (QID) | INTRAMUSCULAR | Status: DC | PRN

## 2018-01-09 NOTE — Progress Notes (Addendum)
[facility] Progress Note  Patient: Carolyn Parrish OMV:[id number]   PCP: Hendricks Limes, MD DOB: Jul 23, 1940   DOA: 01/07/2018   DOS: 01/09/2018   Date of Service: the patient was seen and examined on 01/09/2018  Brief hospital course: Pt. with PMH of CVA, schizoaffective disorder, HTN, HLD, DM, breast cancer; admitted on 01/07/2018, presented with complaint of confusion, was found to have acute encephalopathy. Currently further plan is further work-up.  Subjective: Patient is able to follow commands, per family patient carries out a normal conversation at her baseline.  Agitated last night as well as later in the afternoon today.  Assessment and Plan: 1.  Acute encephalopathy. Schizoaffective disorder, hypernatremia, asymptomatic bacteriuria Unclear etiology. Probably metabolic.  Still not back to baseline per family. Now has hypernatremia. He had unremarkable for any acute abnormality. Chest x-ray negative for pneumonia.  UA has pyuria but urine culture is negative. No focal deficit on examination. Unable to cooperate completely with examination due to agitation. Zyprexa added.  Will monitor. Appreciate psychiatry input. Continue Depakote 250 mg daily.  2.  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 Recently started on anastrozole. There is some concern that this medication is associated with patient's agitation. Less likely the cause per oncology and would recommend to continue this medication given patient's high risk for recurrence. Radiation oncology was notified about patient's admission as well.  3.  Type 2 diabetes mellitus.  Controlled.  No complication. On metformin at home. Last hemoglobin A1c 6.3. Currently on sliding scale insulin. Holding metformin.  4.  Essential hypertension. Blood pressure mildly elevated. Likely in the setting of agitation. We will monitor.  5.  Leukocytosis. Likely stress reaction. Cultures are so far negative. We  will monitor for now.  6.obsesity Body mass index is 30.87 kg/m.   7.  History of CVA. Continue 81 mg aspirin  Diet: carb modified diet DVT Prophylaxis: subcutaneous Heparin  Advance goals of care discussion: full; code  Family Communication: no family was present at bedside, at the time of interview.   Disposition:  Discharge to home.  Consultants: psychiatry  Procedures: none  Scheduled Meds: . anastrozole  1 mg Oral Daily  . aspirin EC  81 mg Oral Daily  . divalproex  250 mg Oral Daily  . docusate sodium  100 mg Oral BID  . enoxaparin (LOVENOX) injection  40 mg Subcutaneous Q24H  . insulin aspart  0-15 Units Subcutaneous TID WC  . insulin aspart  0-5 Units Subcutaneous QHS  . OLANZapine  5 mg Oral BID  . polyethylene glycol  17 g Oral Daily   Continuous Infusions: . sodium chloride 100 mL/hr at 01/09/18 0148   PRN Meds: acetaminophen **OR** acetaminophen, alum & mag hydroxide-simeth, bisacodyl, haloperidol lactate, ondansetron **OR** ondansetron (ZOFRAN) IV, oxyCODONE Antibiotics: Anti-infectives (From admission, onward)   Start     Dose/Rate Route Frequency Ordered Stop   01/08/18 0245  cefTRIAXone (ROCEPHIN) 1 g in sodium chloride 0.9 % 100 mL IVPB     1 g 200 mL/hr over 30 Minutes Intravenous  Once 01/08/18 0244 01/08/18 0331       Objective: Physical Exam: Vitals:   01/08/18 1657 01/08/18 2336 01/09/18 0716 01/09/18 1639  BP: (!) 156/66 (!) 158/90 (!) 168/93 (!) 147/60  Pulse: (!) 106 (!) 113 (!) 106 (!) 102  Resp:      Temp: 98.1 F (36.7 C) 97.8 F (36.6 C) 98.1 F (36.7 C) 97.9 F (36.6 C)  TempSrc: Oral Oral Oral  SpO2: 96% 96% 99% 99%  Weight:      Height:       No intake or output data in the 24 hours ending 01/09/18 1711 Filed Weights   01/08/18 1321  Weight: 97.6 kg   General: Alert, Awake and Oriented to Time, Place and Person. Appear in moderate distress, affect labile Eyes: PERRL, Conjunctiva normal ENT: Oral Mucosa clear  moist Neck: no JVD, no Abnormal Mass Or lumps Cardiovascular: S1 and S2 Present, no Murmur, Peripheral Pulses Present Respiratory: normal respiratory effort, Bilateral Air entry equal and Decreased, no use of accessory muscle, Clear to Auscultation, no Crackles, no wheezes Abdomen: Bowel Sound present, Soft and no tenderness, no hernia Skin: no redness, no Rash, no induration Extremities: no Pedal edema, no calf tenderness Neurologic: Grossly no focal neuro deficit. Bilaterally Equal motor strength  Data Reviewed: CBC: Recent Labs  Lab 01/08/18 0015 01/09/18 0249  WBC 14.7* 16.2*  NEUTROABS 8.4*  --   HGB 14.0 12.5  HCT 45.9 42.8  MCV 95.2 97.7  PLT 302 902   Basic Metabolic Panel: Recent Labs  Lab 01/08/18 0015 01/09/18 0249  NA 141 147*  K 3.7 4.0  CL 102 108  CO2 24 21*  GLUCOSE 109* 98  BUN 11 11  CREATININE 0.99 1.07*  CALCIUM 9.9 9.3    Liver Function Tests: Recent Labs  Lab 01/08/18 0015  AST 21  ALT 23  ALKPHOS 64  BILITOT 0.9  PROT 7.8  ALBUMIN 3.9   Recent Labs  Lab 01/08/18 0015  LIPASE 23   Recent Labs  Lab 01/08/18 0015  AMMONIA 27   Coagulation Profile: No results for input(s): INR, PROTIME in the last 168 hours. Cardiac Enzymes: No results for input(s): CKTOTAL, CKMB, CKMBINDEX, TROPONINI in the last 168 hours. BNP (last 3 results) No results for input(s): PROBNP in the last 8760 hours. CBG: Recent Labs  Lab 01/08/18 1653 01/08/18 2142 01/09/18 0717 01/09/18 1135 01/09/18 1640  GLUCAP 105* 102* 85 89 111*   Studies: No results found.   Time spent: 35 minutes  Author: Berle Mull, MD [facility] Pager: [phone] 01/09/2018 5:11 PM  Between 7PM-7AM, please contact night-coverage at [email or web address], password [facility]

## 2018-01-09 NOTE — Progress Notes (Signed)
Carolyn Parrish   DOB:[birth year]   YO#:[id number]   DXA#:[id number]  Subjective:  Carolyn Parrish was admitted 01/08/2018 with altered mental status and c/o abdominal pain. Non-contrast head CT and CT of the abd/pelvis show no acute changes. Cultures negative so far. Daughter queries whether anastrozole could be the cause.  Carolyn Parrish is talking out loud by herself in the room. When I come in she says "hello doctor." She tells me she is fine. She is not oriented to place or time.; No family in room   Objective: older African American woman examined in bed Vitals:   01/08/18 2336 01/09/18 0716  BP: (!) 158/90 (!) 168/93  Pulse: (!) 113 (!) 106  Resp:    Temp: 97.8 F (36.6 C) 98.1 F (36.7 C)  SpO2: 96% 99%    Body mass index is 30.87 kg/m.  Intake/Output Summary (Last 24 hours) at 01/09/2018 0813 Last data filed at 01/08/2018 1700 Gross per 24 hour  Intake 530.26 ml  Output -  Net 530.26 ml     CBG (last 3)  Recent Labs    01/08/18 1653 01/08/18 2142 01/09/18 0717  GLUCAP 105* 102* 85     Labs:  Lab Results  Component Value Date   WBC 16.2 (H) 01/09/2018   HGB 12.5 01/09/2018   HCT 42.8 01/09/2018   MCV 97.7 01/09/2018   PLT 323 01/09/2018   NEUTROABS 8.4 (H) 01/08/2018    _0 @  Urine Studies No results for input(s): UHGB, CRYS in the last 72 hours.  Invalid input(s): UACOL, UAPR, USPG, UPH, UTP, UGL, UKET, UBIL, UNIT, UROB, ULEU, UEPI, UWBC, URBC, UBAC, CAST, UCOM, BILUA  Basic Metabolic Panel: Recent Labs  Lab 01/08/18 0015 01/09/18 0249  NA 141 147*  K 3.7 4.0  CL 102 108  CO2 24 21*  GLUCOSE 109* 98  BUN 11 11  CREATININE 0.99 1.07*  CALCIUM 9.9 9.3   GFR Estimated Creatinine Clearance: 57.4 mL/min (A) (by C-G formula based on SCr of 1.07 mg/dL (H)). Liver Function Tests: Recent Labs  Lab 01/08/18 0015  AST 21  ALT 23  ALKPHOS 64  BILITOT 0.9  PROT 7.8  ALBUMIN 3.9   Recent Labs  Lab 01/08/18 0015  LIPASE 23   Recent Labs  Lab  01/08/18 0015  AMMONIA 27   Coagulation profile No results for input(s): INR, PROTIME in the last 168 hours.  CBC: Recent Labs  Lab 01/08/18 0015 01/09/18 0249  WBC 14.7* 16.2*  NEUTROABS 8.4*  --   HGB 14.0 12.5  HCT 45.9 42.8  MCV 95.2 97.7  PLT 302 323   Cardiac Enzymes: No results for input(s): CKTOTAL, CKMB, CKMBINDEX, TROPONINI in the last 168 hours. BNP: Invalid input(s): POCBNP CBG: Recent Labs  Lab 01/08/18 0004 01/08/18 1209 01/08/18 1653 01/08/18 2142 01/09/18 0717  GLUCAP 103* 119* 105* 102* 85   D-Dimer No results for input(s): DDIMER in the last 72 hours. Hgb A1c No results for input(s): HGBA1C in the last 72 hours. Lipid Profile No results for input(s): CHOL, HDL, LDLCALC, TRIG, CHOLHDL, LDLDIRECT in the last 72 hours. Thyroid function studies No results for input(s): TSH, T4TOTAL, T3FREE, THYROIDAB in the last 72 hours.  Invalid input(s): FREET3 Anemia work up No results for input(s): VITAMINB12, FOLATE, FERRITIN, TIBC, IRON, RETICCTPCT in the last 72 hours. Microbiology Recent Results (from the past 240 hour(s))  Urine Culture     Status: None   Collection Time: 01/08/18 12:12 AM  Result Value Ref Range Status  Specimen Description URINE, CATHETERIZED  Final   Special Requests NONE  Final   Culture   Final    NO GROWTH Performed at [facility], [address] [place], [place] [address]    Report Status 01/09/2018 FINAL  Final      Studies:  Ct Head Wo Contrast  Result Date: 01/08/2018 CLINICAL DATA:  77 year old female with altered mental status. EXAM: CT HEAD WITHOUT CONTRAST TECHNIQUE: Contiguous axial images were obtained from the base of the skull through the vertex without intravenous contrast. COMPARISON:  None. FINDINGS: Brain: The ventricles and sulci appropriate size for patient's age. Mild periventricular and deep white matter chronic microvascular ischemic changes noted. There is no acute intracranial hemorrhage. No  mass effect or midline shift. No extra-axial fluid collection. There is mild cerebellar atrophy. Vascular: No hyperdense vessel or unexpected calcification. Skull: Normal. Negative for fracture or focal lesion. Sinuses/Orbits: No acute finding. Other: None IMPRESSION: 1. No acute intracranial hemorrhage. 2. Mild age-related atrophy and chronic microvascular ischemic changes. Electronically Signed   By: Anner Crete M.D.   On: 01/08/2018 00:40   Ct Abdomen Pelvis W Contrast  Result Date: 01/08/2018 CLINICAL DATA:  77 year old female with generalized abdominal pain. EXAM: CT ABDOMEN AND PELVIS WITH CONTRAST TECHNIQUE: Multidetector CT imaging of the abdomen and pelvis was performed using the standard protocol following bolus administration of intravenous contrast. CONTRAST:  157m OMNIPAQUE IOHEXOL 300 MG/ML  SOLN COMPARISON:  None. FINDINGS: Evaluation of this exam is limited due to respiratory motion artifact. Lower chest: There bibasilar dependent atelectasis of the visualized lung bases. No intra-abdominal free air or free fluid. Hepatobiliary: Apparent fatty infiltration of the liver. No intrahepatic biliary ductal dilatation. Cholecystectomy. A biliary stent is noted in the CBD. Pancreas: Unremarkable. No pancreatic ductal dilatation or surrounding inflammatory changes. Spleen: Normal in size without focal abnormality. Adrenals/Urinary Tract: Adrenal glands are unremarkable. Kidneys are normal, without renal calculi, focal lesion, or hydronephrosis. Bladder is unremarkable. Stomach/Bowel: There is redundancy of the sigmoid colon. There are scattered colonic diverticula without active inflammatory changes. Mild gaseous distention of the colon. There is no bowel obstruction or active inflammation. Normal appendix. Vascular/Lymphatic: Mild aortoiliac atherosclerotic disease. No portal venous gas. There is no adenopathy. Reproductive: The uterus is anteverted and grossly unremarkable. The ovaries are grossly  unremarkable as visualized. Other: None Musculoskeletal: Degenerative changes of the spine. No acute osseous pathology. IMPRESSION: 1. No acute intra-abdominal or pelvic pathology. Mild gaseous distention of the colon. No bowel obstruction or active inflammation. Normal appendix. 2. Cholecystectomy with CBD stent in place. No intrahepatic biliary ductal dilatation. 3. Fatty liver. Electronically Signed   By: AAnner CreteM.D.   On: 01/08/2018 04:21   Dg Chest Port 1 View  Result Date: 01/08/2018 CLINICAL DATA:  Altered mental status, shortness of breath. History of breast cancer. EXAM: PORTABLE CHEST 1 VIEW COMPARISON:  Chest radiograph November 14, 2017 FINDINGS: Cardiac silhouette is mildly enlarged and unchanged. Similar fullness of the mediastinum, potentially a projectional in this low inspiratory portable examination with crowded vascular markings, no pleural effusion or focal consolidation. No pneumothorax. Soft tissue planes and included osseous structures are nonacute. IMPRESSION: Mild cardiomegaly. No acute pulmonary process in this low inspiratory examination. Electronically Signed   By: CElon AlasM.D.   On: 01/08/2018 00:26    Assessment: 77y.o. [facility] resident status post left breast upper outer quadrant biopsy 09/05/2017 for a clinical T2N0, stage Ib invasive ductal carcinoma, grade 2, estrogen and  progesterone receptor positive, HER-2 not amplified, with an MIB-1 of 15%.  (1) status post left mastectomy 10/30/2017, for a pT4 pN1, stage IIIB invasive ductal carcinoma, grade 2, with lymphovascular invasion and involvement of the nipple and epidermis of the areola, but with negative margins             (a) 1 of 2 non-sentinel lymph nodes removed positive straight capsular extension  (2) anastrozole started on 12/12/2017  (3) adjuvant radiation pending  Plan:  Kinzleigh is s/p remote CVA and has a history of schizo-affective disorder. Per  psychiatry's note, she has had episodes similar to this one in past associated with UTIs. Hopefully she will return to her baseline soon.  Anastrozole does not cause the type of altered mental status experienced by this patient.. I recommend continuing the medicationas Dacota is at very high risk for local and systemic recurrence of her cancer.  She is scheduled for adjuvant radiation. I will let Dr Lisbeth Renshaw her radiation oncologist know of the patient's admission.  Will follow peripherally.  Chauncey Cruel, MD 01/09/2018  8:13 AM Medical Oncology and Hematology [facility] [address] [place], [place] [address] Tel. [phone]    Fax. [phone]

## 2018-01-10 ENCOUNTER — Ambulatory Visit: Payer: Medicare Other

## 2018-01-10 ENCOUNTER — Inpatient Hospital Stay (HOSPITAL_COMMUNITY): Payer: Medicare Other

## 2018-01-10 DIAGNOSIS — L299 Pruritus, unspecified: Secondary | ICD-10-CM | POA: Diagnosis present

## 2018-01-10 DIAGNOSIS — Z7984 Long term (current) use of oral hypoglycemic drugs: Secondary | ICD-10-CM | POA: Diagnosis not present

## 2018-01-10 DIAGNOSIS — E860 Dehydration: Secondary | ICD-10-CM | POA: Diagnosis present

## 2018-01-10 DIAGNOSIS — Z8673 Personal history of transient ischemic attack (TIA), and cerebral infarction without residual deficits: Secondary | ICD-10-CM | POA: Diagnosis not present

## 2018-01-10 DIAGNOSIS — Z7982 Long term (current) use of aspirin: Secondary | ICD-10-CM | POA: Diagnosis not present

## 2018-01-10 DIAGNOSIS — F319 Bipolar disorder, unspecified: Secondary | ICD-10-CM | POA: Diagnosis present

## 2018-01-10 DIAGNOSIS — I509 Heart failure, unspecified: Secondary | ICD-10-CM | POA: Diagnosis present

## 2018-01-10 DIAGNOSIS — G9340 Encephalopathy, unspecified: Secondary | ICD-10-CM | POA: Diagnosis present

## 2018-01-10 DIAGNOSIS — R402142 Coma scale, eyes open, spontaneous, at arrival to emergency department: Secondary | ICD-10-CM | POA: Diagnosis present

## 2018-01-10 DIAGNOSIS — F259 Schizoaffective disorder, unspecified: Secondary | ICD-10-CM | POA: Diagnosis present

## 2018-01-10 DIAGNOSIS — Z79899 Other long term (current) drug therapy: Secondary | ICD-10-CM | POA: Diagnosis not present

## 2018-01-10 DIAGNOSIS — Z9012 Acquired absence of left breast and nipple: Secondary | ICD-10-CM | POA: Diagnosis not present

## 2018-01-10 DIAGNOSIS — E11649 Type 2 diabetes mellitus with hypoglycemia without coma: Secondary | ICD-10-CM | POA: Diagnosis not present

## 2018-01-10 DIAGNOSIS — E870 Hyperosmolality and hypernatremia: Secondary | ICD-10-CM | POA: Diagnosis not present

## 2018-01-10 DIAGNOSIS — Z9049 Acquired absence of other specified parts of digestive tract: Secondary | ICD-10-CM | POA: Diagnosis not present

## 2018-01-10 DIAGNOSIS — Z853 Personal history of malignant neoplasm of breast: Secondary | ICD-10-CM | POA: Diagnosis not present

## 2018-01-10 DIAGNOSIS — Z170 Estrogen receptor positive status [ER+]: Secondary | ICD-10-CM | POA: Diagnosis not present

## 2018-01-10 DIAGNOSIS — E785 Hyperlipidemia, unspecified: Secondary | ICD-10-CM | POA: Diagnosis present

## 2018-01-10 DIAGNOSIS — I110 Hypertensive heart disease with heart failure: Secondary | ICD-10-CM | POA: Diagnosis present

## 2018-01-10 DIAGNOSIS — Z888 Allergy status to other drugs, medicaments and biological substances status: Secondary | ICD-10-CM | POA: Diagnosis not present

## 2018-01-10 DIAGNOSIS — R402362 Coma scale, best motor response, obeys commands, at arrival to emergency department: Secondary | ICD-10-CM | POA: Diagnosis present

## 2018-01-10 DIAGNOSIS — E876 Hypokalemia: Secondary | ICD-10-CM | POA: Diagnosis not present

## 2018-01-10 DIAGNOSIS — R402242 Coma scale, best verbal response, confused conversation, at arrival to emergency department: Secondary | ICD-10-CM | POA: Diagnosis present

## 2018-01-10 LAB — CBC WITH DIFFERENTIAL/PLATELET
Abs Immature Granulocytes: 0.06 10*3/uL (ref 0.00–0.07)
Basophils Absolute: 0.1 10*3/uL (ref 0.0–0.1)
Basophils Relative: 0 %
Eosinophils Absolute: 0.1 10*3/uL (ref 0.0–0.5)
Eosinophils Relative: 1 %
HCT: 40.5 % (ref 36.0–46.0)
Hemoglobin: 12 g/dL (ref 12.0–15.0)
Immature Granulocytes: 1 %
Lymphocytes Relative: 28 %
Lymphs Abs: 3.1 10*3/uL (ref 0.7–4.0)
MCH: 29.4 pg (ref 26.0–34.0)
MCHC: 29.6 g/dL — ABNORMAL LOW (ref 30.0–36.0)
MCV: 99.3 fL (ref 80.0–100.0)
MONO ABS: 0.9 10*3/uL (ref 0.1–1.0)
Monocytes Relative: 8 %
Neutro Abs: 6.9 10*3/uL (ref 1.7–7.7)
Neutrophils Relative %: 62 %
Platelets: 284 10*3/uL (ref 150–400)
RBC: 4.08 MIL/uL (ref 3.87–5.11)
RDW: 16.1 % — ABNORMAL HIGH (ref 11.5–15.5)
WBC: 11.2 10*3/uL — ABNORMAL HIGH (ref 4.0–10.5)
nRBC: 0 % (ref 0.0–0.2)

## 2018-01-10 LAB — COMPREHENSIVE METABOLIC PANEL
ALT: 21 U/L (ref 0–44)
AST: 21 U/L (ref 15–41)
Albumin: 3.1 g/dL — ABNORMAL LOW (ref 3.5–5.0)
Alkaline Phosphatase: 50 U/L (ref 38–126)
Anion gap: 12 (ref 5–15)
BUN: 14 mg/dL (ref 8–23)
CO2: 24 mmol/L (ref 22–32)
Calcium: 8.7 mg/dL — ABNORMAL LOW (ref 8.9–10.3)
Chloride: 108 mmol/L (ref 98–111)
Creatinine, Ser: 1.03 mg/dL — ABNORMAL HIGH (ref 0.44–1.00)
GFR calc Af Amer: >60 mL/min (ref >60)
GFR calc non Af Amer: 53 mL/min — ABNORMAL LOW (ref >60)
Glucose, Bld: 107 mg/dL — ABNORMAL HIGH (ref 70–99)
Potassium: 3.8 mmol/L (ref 3.5–5.1)
Sodium: 144 mmol/L (ref 135–145)
Total Bilirubin: 1.1 mg/dL (ref 0.3–1.2)
Total Protein: 6.9 g/dL (ref 6.5–8.1)

## 2018-01-10 LAB — GLUCOSE, CAPILLARY
Glucose-Capillary: 91 mg/dL (ref 70–99)
Glucose-Capillary: 83 mg/dL (ref 70–99)
Glucose-Capillary: 112 mg/dL — ABNORMAL HIGH (ref 70–99)
Glucose-Capillary: 114 mg/dL — ABNORMAL HIGH (ref 70–99)

## 2018-01-10 LAB — T4, FREE: Free T4: 1.05 ng/dL (ref 0.82–1.77)

## 2018-01-10 LAB — TSH: TSH: 1.331 u[IU]/mL (ref 0.350–4.500)

## 2018-01-10 LAB — AMMONIA: Ammonia: 30 umol/L (ref 9–35)

## 2018-01-10 LAB — VITAMIN B12: Vitamin B-12: 1079 pg/mL — ABNORMAL HIGH (ref 180–914)

## 2018-01-10 LAB — VALPROIC ACID LEVEL: VALPROIC ACID LVL: 14 ug/mL — AB (ref 50.0–100.0)

## 2018-01-10 MED ORDER — OLANZAPINE 5 MG PO TABS
5.0000 mg | ORAL_TABLET | Freq: Every day | ORAL | Status: DC
  Administered 2018-01-10 – 2018-01-11 (×2): 5 mg via ORAL
  Filled 2018-01-10 (×2): qty 1

## 2018-01-10 MED ORDER — DEXTROSE IN LACTATED RINGERS 5 % IV SOLN
INTRAVENOUS | Status: DC
  Administered 2018-01-10 – 2018-01-11 (×2): via INTRAVENOUS

## 2018-01-10 NOTE — Progress Notes (Signed)
[facility] Progress Note  Patient: Carolyn Parrish:[id number]   PCP: Hendricks Limes, MD DOB: [birth year]   DOA: 01/07/2018   DOS: 01/10/2018   Date of Service: the patient was seen and examined on 01/10/2018  Brief hospital course: Pt. with PMH of CVA, schizoaffective disorder, HTN, HLD, DM, breast cancer; admitted on 01/07/2018, presented with complaint of confusion, was found to have acute encephalopathy. Currently further plan is further work-up.  Subjective: feeling better, less agitated but still not back to baseline.  Daughter was at bedside.  Per her at her baseline she carries out a normal conversation and is able to participate in activities.  Assessment and Plan: 1.  Acute encephalopathy. Schizoaffective disorder, hypernatremia, asymptomatic bacteriuria Unclear etiology. Probably metabolic.  Still not back to baseline per family. Now has hypernatremia. He had unremarkable for any acute abnormality. Chest x-ray negative for pneumonia.  UA has pyuria but urine culture is negative. No focal deficit on examination. Unable to cooperate completely with examination due to agitation. Zyprexa added.  Will decrease from twice daily to daily dosing Appreciate psychiatry input. Continue Depakote 250 mg daily. Metabolic work-up as well as repeat CT scan negative for any acute abnormality.  2.  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 Recently started on anastrozole. There is some concern that this medication is associated with patient's agitation. Less likely the cause per oncology and would recommend to continue this medication given patient's high risk for recurrence. Radiation oncology was notified about patient's admission as well.  3.  Type 2 diabetes mellitus.  Controlled.  No complication. On metformin at home. Last hemoglobin A1c 6.3. Currently on sliding scale insulin. Holding metformin.  4.  Essential hypertension. Blood pressure mildly  elevated. Likely in the setting of agitation. We will monitor.  5.  Leukocytosis. Likely stress reaction. Cultures are so far negative. We will monitor for now.  6.obsesity Body mass index is 30.87 kg/m.   7.  History of CVA. Continue 81 mg aspirin  Diet: carb modified diet DVT Prophylaxis: subcutaneous Heparin  Advance goals of care discussion: full; code  Family Communication: no family was present at bedside, at the time of interview.   Disposition:  Discharge to home.  Consultants: psychiatry  Procedures: none  Scheduled Meds: . anastrozole  1 mg Oral Daily  . aspirin EC  81 mg Oral Daily  . divalproex  250 mg Oral Daily  . docusate sodium  100 mg Oral BID  . enoxaparin (LOVENOX) injection  40 mg Subcutaneous Q24H  . insulin aspart  0-15 Units Subcutaneous TID WC  . insulin aspart  0-5 Units Subcutaneous QHS  . OLANZapine  5 mg Oral QHS  . polyethylene glycol  17 g Oral Daily   Continuous Infusions: . dextrose 5% lactated ringers 75 mL/hr at 01/10/18 1236   PRN Meds: acetaminophen **OR** acetaminophen, alum & mag hydroxide-simeth, bisacodyl, haloperidol lactate, ondansetron **OR** ondansetron (ZOFRAN) IV, oxyCODONE Antibiotics: Anti-infectives (From admission, onward)   Start     Dose/Rate Route Frequency Ordered Stop   01/08/18 0245  cefTRIAXone (ROCEPHIN) 1 g in sodium chloride 0.9 % 100 mL IVPB     1 g 200 mL/hr over 30 Minutes Intravenous  Once 01/08/18 0244 01/08/18 0331       Objective: Physical Exam: Vitals:   01/09/18 0716 01/09/18 1639 01/10/18 0009 01/10/18 1008  BP: (!) 168/93 (!) 147/60 120/71 (!) 141/66  Pulse: (!) 106 (!) 102 94 83  Resp:   20 20  Temp: 98.1 F (36.7 C) 97.9 F (36.6 C) 97.8 F (36.6 C) 97.7 F (36.5 C)  TempSrc: Oral   Oral  SpO2: 99% 99% 100% 100%  Weight:      Height:        Intake/Output Summary (Last 24 hours) at 01/10/2018 1911 Last data filed at 01/10/2018 1800 Gross per 24 hour  Intake 404.04 ml    Output -  Net 404.04 ml   Filed Weights   01/08/18 1321  Weight: 97.6 kg   General: Alert, Awake and Oriented to Time, Place and Person. Appear in moderate distress, affect labile Eyes: PERRL, Conjunctiva normal ENT: Oral Mucosa clear moist Neck: no JVD, no Abnormal Mass Or lumps Cardiovascular: S1 and S2 Present, no Murmur, Peripheral Pulses Present Respiratory: normal respiratory effort, Bilateral Air entry equal and Decreased, no use of accessory muscle, Clear to Auscultation, no Crackles, no wheezes Abdomen: Bowel Sound present, Soft and no tenderness, no hernia Skin: no redness, no Rash, no induration Extremities: no Pedal edema, no calf tenderness Neurologic: Grossly no focal neuro deficit. Bilaterally Equal motor strength  Data Reviewed: CBC: Recent Labs  Lab 01/08/18 0015 01/09/18 0249 01/10/18 0250  WBC 14.7* 16.2* 11.2*  NEUTROABS 8.4*  --  6.9  HGB 14.0 12.5 12.0  HCT 45.9 42.8 40.5  MCV 95.2 97.7 99.3  PLT 302 323 361   Basic Metabolic Panel: Recent Labs  Lab 01/08/18 0015 01/09/18 0249 01/10/18 0250  NA 141 147* 144  K 3.7 4.0 3.8  CL 102 108 108  CO2 24 21* 24  GLUCOSE 109* 98 107*  BUN '11 11 14  '$ CREATININE 0.99 1.07* 1.03*  CALCIUM 9.9 9.3 8.7*    Liver Function Tests: Recent Labs  Lab 01/08/18 0015 01/10/18 0250  AST 21 21  ALT 23 21  ALKPHOS 64 50  BILITOT 0.9 1.1  PROT 7.8 6.9  ALBUMIN 3.9 3.1*   Recent Labs  Lab 01/08/18 0015  LIPASE 23   Recent Labs  Lab 01/08/18 0015 01/10/18 1259  AMMONIA 27 30   Coagulation Profile: No results for input(s): INR, PROTIME in the last 168 hours. Cardiac Enzymes: No results for input(s): CKTOTAL, CKMB, CKMBINDEX, TROPONINI in the last 168 hours. BNP (last 3 results) No results for input(s): PROBNP in the last 8760 hours. CBG: Recent Labs  Lab 01/09/18 1640 01/09/18 2057 01/10/18 0725 01/10/18 1133 01/10/18 1733  GLUCAP 111* 94 91 83 112*   Studies: Ct Head Wo  Contrast  Result Date: 01/10/2018 CLINICAL DATA:  77 y/o  F; acute encephalopathy. EXAM: CT HEAD WITHOUT CONTRAST TECHNIQUE: Contiguous axial images were obtained from the base of the skull through the vertex without intravenous contrast. COMPARISON:  01/08/2018 CT head. FINDINGS: Brain: No evidence of acute infarction, hemorrhage, hydrocephalus, extra-axial collection or mass lesion/mass effect. Stable mild chronic microvascular ischemic changes of the brain and supratentorial volume loss. Asymmetric advanced volume loss of the cerebellum. Vascular: Calcific atherosclerosis of carotid siphons. No hyperdense vessel identified. Skull: Normal. Negative for fracture or focal lesion. Sinuses/Orbits: No acute finding. Other: None. IMPRESSION: 1. No acute intracranial abnormality identified. 2. Stable mild chronic microvascular ischemic changes of the brain and supratentorial volume loss. 3. Asymmetric cerebellar volume loss. This may be due to alcoholic cerebellar degeneration, phenytoin/lithium toxicity, sequelae of cerebellitis, associated with an inherited ataxia syndrome, or represent a neurodegenerate disorder such as multi-system atrophy. Electronically Signed   By: Kristine Garbe M.D.   On: 01/10/2018 14:38     Time spent:  35 minutes  Author: Berle Mull, MD [facility] Pager: [phone] 01/10/2018 7:11 PM  Between 7PM-7AM, please contact night-coverage at [email or web address], password [facility]

## 2018-01-11 ENCOUNTER — Ambulatory Visit: Payer: Medicare Other

## 2018-01-11 LAB — CBC
HCT: 38.8 % (ref 36.0–46.0)
Hemoglobin: 11.4 g/dL — ABNORMAL LOW (ref 12.0–15.0)
MCH: 28.8 pg (ref 26.0–34.0)
MCHC: 29.4 g/dL — ABNORMAL LOW (ref 30.0–36.0)
MCV: 98 fL (ref 80.0–100.0)
NRBC: 0 % (ref 0.0–0.2)
Platelets: 268 10*3/uL (ref 150–400)
RBC: 3.96 MIL/uL (ref 3.87–5.11)
RDW: 15.9 % — ABNORMAL HIGH (ref 11.5–15.5)
WBC: 10.4 10*3/uL (ref 4.0–10.5)

## 2018-01-11 LAB — GLUCOSE, CAPILLARY
Glucose-Capillary: 119 mg/dL — ABNORMAL HIGH (ref 70–99)
Glucose-Capillary: 114 mg/dL — ABNORMAL HIGH (ref 70–99)
Glucose-Capillary: 88 mg/dL (ref 70–99)
Glucose-Capillary: 140 mg/dL — ABNORMAL HIGH (ref 70–99)

## 2018-01-11 LAB — BASIC METABOLIC PANEL WITH GFR
Anion gap: 8 (ref 5–15)
BUN: 9 mg/dL (ref 8–23)
CO2: 26 mmol/L (ref 22–32)
Calcium: 8.5 mg/dL — ABNORMAL LOW (ref 8.9–10.3)
Chloride: 111 mmol/L (ref 98–111)
Creatinine, Ser: 0.89 mg/dL (ref 0.44–1.00)
GFR calc Af Amer: >60 mL/min
GFR calc non Af Amer: >60 mL/min
Glucose, Bld: 150 mg/dL — ABNORMAL HIGH (ref 70–99)
Potassium: 3.4 mmol/L — ABNORMAL LOW (ref 3.5–5.1)
Sodium: 145 mmol/L (ref 135–145)

## 2018-01-11 MED ORDER — ENSURE ENLIVE PO LIQD
237.0000 mL | Freq: Three times a day (TID) | ORAL | Status: DC
  Administered 2018-01-11 – 2018-01-12 (×2): 237 mL via ORAL

## 2018-01-11 MED ORDER — POTASSIUM CHLORIDE CRYS ER 20 MEQ PO TBCR
40.0000 meq | EXTENDED_RELEASE_TABLET | Freq: Once | ORAL | Status: AC
  Administered 2018-01-11: 40 meq via ORAL
  Filled 2018-01-11: qty 2

## 2018-01-11 NOTE — NC FL2 (Signed)
[place] LEVEL OF CARE SCREENING TOOL     IDENTIFICATION  Patient Name: Carolyn Parrish Birthdate: 21-Jun-1940 Sex: female Admission Date (Current Location): 01/07/2018  [address] [facility] and [place] Number:  Herbalist and Address:  [facility], [place] [address] [place], [place] [address]      Provider Number: [id number]  Attending Physician Name and Address:  Lavina Hamman, MD  Relative Name and Phone Number:       Current Level of Care: Hospital Recommended Level of Care: [place] Prior Approval Number:    Date Approved/Denied:   PASRR Number:    Discharge Plan: SNF    Current Diagnoses: Patient Active Problem List   Diagnosis Date Noted  . Acute hypernatremia 01/09/2018  . Acute encephalopathy 01/08/2018  . Dehydration   . Sepsis ([place]) 11/14/2017  . Septic shock ([place]) 11/14/2017  . AKI (acute kidney injury) ([place])   . Diabetes mellitus treated with oral medication ([place])   . Lactic acidosis   . Bipolar disorder ([place])   . History of completed stroke   . Breast cancer, stage 2, left ([place]) 10/30/2017  . Encounter for central line placement 09/12/2017  . CVA (cerebrovascular accident) ([place]) 09/12/2017  . Diabetes mellitus type 2, controlled, without complications ([place]) 45/80/9983  . Schizoaffective disorder ([place]) 09/12/2017  . Malignant neoplasm of upper-inner quadrant of left breast in female, estrogen receptor positive ([place]) 09/07/2017    Orientation RESPIRATION BLADDER Height & Weight     Self  Normal Incontinent Weight: 215 lb 2.7 oz (97.6 kg) Height:  5\' 10"  (177.8 cm)  BEHAVIORAL SYMPTOMS/MOOD NEUROLOGICAL BOWEL NUTRITION STATUS      Continent Diet(soft diet, thin liquids)  AMBULATORY STATUS COMMUNICATION OF NEEDS Skin   Extensive Assist Verbally Normal                       Personal Care Assistance Level of Assistance  Bathing, Feeding, Dressing Bathing Assistance: Maximum  assistance Feeding assistance: Limited assistance Dressing Assistance: Maximum assistance     Functional Limitations Info  Sight, Hearing, Speech Sight Info: Adequate Hearing Info: Adequate Speech Info: Adequate    SPECIAL CARE FACTORS FREQUENCY  PT (By licensed PT), OT (By licensed OT)                    Contractures Contractures Info: Not present    Additional Factors Info  Code Status, Allergies Code Status Info: Full Code Allergies Info: Atorvastatin           Current Medications (01/11/2018):  This is the current hospital active medication list Current Facility-Administered Medications  Medication Dose Route Frequency Provider Last Rate Last Dose  . acetaminophen (TYLENOL) tablet 650 mg  650 mg Oral Q6H PRN Karmen Bongo, MD       Or  . acetaminophen (TYLENOL) suppository 650 mg  650 mg Rectal Q6H PRN Karmen Bongo, MD      . alum & mag hydroxide-simeth (MAALOX/MYLANTA) 200-200-20 MG/5ML suspension 30 mL  30 mL Oral Q4H PRN Karmen Bongo, MD      . anastrozole (ARIMIDEX) tablet 1 mg  1 mg Oral Daily Karmen Bongo, MD   1 mg at 01/11/18 1136  . aspirin EC tablet 81 mg  81 mg Oral Daily Karmen Bongo, MD   81 mg at 01/11/18 1136  . bisacodyl (DULCOLAX) suppository 10 mg  10 mg Rectal PRN Karmen Bongo, MD      .  divalproex (DEPAKOTE ER) 24 hr tablet 250 mg  250 mg Oral Daily Karmen Bongo, MD   250 mg at 01/11/18 1136  . docusate sodium (COLACE) capsule 100 mg  100 mg Oral BID Karmen Bongo, MD   100 mg at 01/11/18 1136  . enoxaparin (LOVENOX) injection 40 mg  40 mg Subcutaneous Q24H Karmen Bongo, MD   40 mg at 01/10/18 2314  . feeding supplement (ENSURE ENLIVE) (ENSURE ENLIVE) liquid 237 mL  237 mL Oral TID BM Lavina Hamman, MD      . haloperidol lactate (HALDOL) injection 2 mg  2 mg Intravenous Q6H PRN Lavina Hamman, MD      . insulin aspart (novoLOG) injection 0-15 Units  0-15 Units Subcutaneous TID WC Karmen Bongo, MD      . insulin  aspart (novoLOG) injection 0-5 Units  0-5 Units Subcutaneous QHS Karmen Bongo, MD      . OLANZapine [facility]) tablet 5 mg  5 mg Oral QHS Lavina Hamman, MD   5 mg at 01/10/18 2314  . ondansetron (ZOFRAN) tablet 4 mg  4 mg Oral Q6H PRN Karmen Bongo, MD       Or  . ondansetron [facility]) injection 4 mg  4 mg Intravenous Q6H PRN Karmen Bongo, MD      . oxyCODONE (Oxy IR/ROXICODONE) immediate release tablet 5 mg  5 mg Oral Q4H PRN Karmen Bongo, MD   5 mg at 01/08/18 1255  . polyethylene glycol (MIRALAX / GLYCOLAX) packet 17 g  17 g Oral Daily Karmen Bongo, MD   17 g at 01/10/18 4235     Discharge Medications: Please see discharge summary for a list of discharge medications.  Relevant Imaging Results:  Relevant Lab Results:   Additional Information SSN: [id number]  Weslee Prestage A Hartley Wyke, LCSW

## 2018-01-11 NOTE — Progress Notes (Signed)
[facility] Progress Note  Patient: Carolyn Parrish LKG:[id number]   PCP: Hendricks Limes, MD DOB: March 15, 1940   DOA: 01/07/2018   DOS: 01/11/2018   Date of Service: the patient was seen and examined on 01/11/2018  Brief hospital course: Pt. with PMH of CVA, schizoaffective disorder, HTN, HLD, DM, breast cancer; admitted on 01/07/2018, presented with complaint of confusion, was found to have acute encephalopathy. Currently further plan is further work-up.  Subjective: Agitation has resolved, patient denies any nausea or vomiting.  Oral intake still remains negligible.  Eating 20% of her meals only.  Assessment and Plan: 1.  Acute encephalopathy. Schizoaffective disorder,nonsensical with vocal outbursts  Hypernatremia, asymptomatic bacteriuria  Probably combined psychosis and metabolic with poor PO intake Per family the patient communicates normally, is able to feed her, ambulate at her baseline. Patient still remains weak and lethargic, not able to follow any commands on admission now able to follow some commands. Agitation has resolved.  Still needing +2 assistance and hoyer lift. Hypernatremia has improved as well with IV D5 infusion. He had unremarkable for any acute abnormality. Chest x-ray negative for pneumonia.  UA has pyuria but urine culture is negative. No focal deficit on examination. Zyprexa added.  Initially was on 5 twice daily, now tolerating 5 nightly dose. Appreciate psychiatry input. Continue Depakote 250 mg daily. Metabolic work-up as well as repeat CT scan negative for any acute abnormality. PO intake still remains poor with concern for reoccurrence of hypernatremia and hypoglycemia once supportive fluids are withdrawn.   2.  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 Recently started on anastrozole. There is some concern that this medication is associated with patient's agitation. Less likely the cause per oncology and would recommend to  continue this medication given patient's high risk for recurrence. Radiation oncology was notified about patient's admission as well.  3.  Type 2 diabetes mellitus.  Uncontrolled with hypoglycemia.  No complication. On metformin at home. Last hemoglobin A1c 6.3. Initially was on sliding scale insulin, due to hypoglycemia and minimal oral intake with encephalopathy, dextrose was added to IV fluids. Now sodium is better, glucose remains 110 range despite patient being on D5 infusion.  Will discontinue D5 and monitor patient for improvement in hypoglycemia.  4.  Essential hypertension. Blood pressure mildly elevated, now better. Likely in the setting of agitation. We will monitor.  5.  Leukocytosis. Likely stress reaction.  Now resolved Cultures are so far negative. We will monitor for now.  6.obsesity Body mass index is 30.87 kg/m.   7.  History of CVA. Continue 81 mg aspirin  8.  Hypokalemia. We will replace orally.  Diet: Soft diet, added ensure.  DVT Prophylaxis: subcutaneous Heparin  Advance goals of care discussion: full; code  Family Communication: no family was present at bedside, at the time of interview.   Disposition:  Discharge to SNF pending improvement in oral intake, resolution of hypoglycemia.  Consultants: psychiatry  Procedures: none  Scheduled Meds: . anastrozole  1 mg Oral Daily  . aspirin EC  81 mg Oral Daily  . divalproex  250 mg Oral Daily  . docusate sodium  100 mg Oral BID  . enoxaparin (LOVENOX) injection  40 mg Subcutaneous Q24H  . feeding supplement (ENSURE ENLIVE)  237 mL Oral TID BM  . insulin aspart  0-15 Units Subcutaneous TID WC  . insulin aspart  0-5 Units Subcutaneous QHS  . OLANZapine  5 mg Oral QHS  . polyethylene glycol  17 g Oral  Daily   Continuous Infusions:  PRN Meds: acetaminophen **OR** acetaminophen, alum & mag hydroxide-simeth, bisacodyl, haloperidol lactate, ondansetron **OR** ondansetron (ZOFRAN) IV,  oxyCODONE Antibiotics: Anti-infectives (From admission, onward)   Start     Dose/Rate Route Frequency Ordered Stop   01/08/18 0245  cefTRIAXone (ROCEPHIN) 1 g in sodium chloride 0.9 % 100 mL IVPB     1 g 200 mL/hr over 30 Minutes Intravenous  Once 01/08/18 0244 01/08/18 0331       Objective: Physical Exam: Vitals:   01/10/18 1008 01/11/18 0042 01/11/18 0716 01/11/18 1613  BP: (!) 141/66 137/67 128/64 135/68  Pulse: 83 98 80 92  Resp: _0 Temp: 97.7 F (36.5 C) 98.2 F (36.8 C) 98.1 F (36.7 C) 98.4 F (36.9 C)  TempSrc: Oral Oral Oral Oral  SpO2: 100% 98% 98% 97%  Weight:      Height:        Intake/Output Summary (Last 24 hours) at 01/11/2018 1622 Last data filed at 01/11/2018 6378 Gross per 24 hour  Intake 1329.04 ml  Output 550 ml  Net 779.04 ml   Filed Weights   01/08/18 1321  Weight: 97.6 kg   General: Alert, Awake and Oriented to Time, Place and Person. Appear in mild distress, affect flat Eyes: PERRL, Conjunctiva normal ENT: Oral Mucosa clear moist Neck: no JVD, no Abnormal Mass Or lumps Cardiovascular: S1 and S2 Present, no Murmur, Peripheral Pulses Present Respiratory: normal respiratory effort, Bilateral Air entry equal and Decreased, no use of accessory muscle, Clear to Auscultation, no Crackles, no wheezes Abdomen: Bowel Sound present, Soft and no tenderness, no hernia Skin: no redness, no Rash, no induration Extremities: no Pedal edema, no calf tenderness Neurologic: Grossly no focal neuro deficit. Bilaterally Equal motor strength  Data Reviewed: CBC: Recent Labs  Lab 01/08/18 0015 01/09/18 0249 01/10/18 0250 01/11/18 0229  WBC 14.7* 16.2* 11.2* 10.4  NEUTROABS 8.4*  --  6.9  --   HGB 14.0 12.5 12.0 11.4*  HCT 45.9 42.8 40.5 38.8  MCV 95.2 97.7 99.3 98.0  PLT 302 323 284 588   Basic Metabolic Panel: Recent Labs  Lab 01/08/18 0015 01/09/18 0249 01/10/18 0250 01/11/18 0229  NA 141 147* 144 145  K 3.7 4.0 3.8 3.4*  CL 102 108  108 111  CO2 24 21* 24 26  GLUCOSE 109* 98 107* 150*  BUN _1 CREATININE 0.99 1.07* 1.03* 0.89  CALCIUM 9.9 9.3 8.7* 8.5*    Liver Function Tests: Recent Labs  Lab 01/08/18 0015 01/10/18 0250  AST 21 21  ALT 23 21  ALKPHOS 64 50  BILITOT 0.9 1.1  PROT 7.8 6.9  ALBUMIN 3.9 3.1*   Recent Labs  Lab 01/08/18 0015  LIPASE 23   Recent Labs  Lab 01/08/18 0015 01/10/18 1259  AMMONIA 27 30   Coagulation Profile: No results for input(s): INR, PROTIME in the last 168 hours. Cardiac Enzymes: No results for input(s): CKTOTAL, CKMB, CKMBINDEX, TROPONINI in the last 168 hours. BNP (last 3 results) No results for input(s): PROBNP in the last 8760 hours. CBG: Recent Labs  Lab 01/10/18 1733 01/10/18 2204 01/11/18 0718 01/11/18 1128 01/11/18 1613  GLUCAP 112* 114* 119* 114* 88   Studies: No results found.   Time spent: 35 minutes  Author: Berle Mull, MD [facility] Pager: [phone] 01/11/2018 4:22 PM  Between 7PM-7AM, please contact night-coverage at [email or web address], password [facility]

## 2018-01-11 NOTE — Clinical Social Work Note (Signed)
Clinical Social Work Assessment  Patient Details  Name: Carolyn Parrish MRN: [id number] Date of Birth: [birth year]  Date of referral:  01/11/18               Reason for consult:  Facility Placement                Permission sought to share information with:  Family Supports Permission granted to share information::     Name::     Animal nutritionist::  [place]  Relationship::  daughter  Contact Information:     Housing/Transportation Living arrangements for the past 2 months:  [place] of Information:  Adult Children Patient Interpreter Needed:  None Criminal Activity/Legal Involvement Pertinent to Current Situation/Hospitalization:  No - Comment as needed Significant Relationships:  Adult Children Lives with:  Facility Resident Do you feel safe going back to the place where you live?  Yes Need for family participation in patient care:  No (Coment)  Care giving concerns:  Pt is only oriented to self.   Social Worker assessment / plan:  CSW spoke with pt's daughter via telephone. Pt's daughter confirmed pt is from [place]. Pt was there for long term care. Pt's daughter is agreeable to pt returning at d/c. CSW explained there is a tentative d/c for tomorrow. Daughter is agreeable and just ask for a call if pt will d/c.   Employment status:  Retired Nurse, adult, Medicaid In [place] PT Recommendations:  Not assessed at this time [place] / Referral to community resources:  [place]  Patient/Family's Response to care:  Pt's daughter verbalized understanding of CSW role and expressed appreciation for support. Pt's daughter denies any concern regarding pt care at this time.   Patient/Family's Understanding of and Emotional Response to Diagnosis, Current Treatment, and Prognosis:  Pt's daughter understanding and realistic regarding pt's physical limitations. Pt's daughter agreeable to pt returning to [facility] at  d/c. Pt's daughter denies any further concerns.  Emotional Assessment Appearance:  Appears stated age Attitude/Demeanor/Rapport:  Unable to Assess Affect (typically observed):  Unable to Assess Orientation:  Oriented to Self Alcohol / Substance use:  Not Applicable Psych involvement (Current and /or in the community):  No (Comment)  Discharge Needs  Concerns to be addressed:  Basic Needs Readmission within the last 30 days:  No Current discharge risk:  Dependent with Mobility Barriers to Discharge:  Continued Medical Work up   [employer], LCSW 01/11/2018, 12:26 PM

## 2018-01-11 NOTE — Evaluation (Signed)
Physical Therapy Evaluation Patient Details Name: Carolyn Parrish MRN: [id number] DOB: 1940-10-22 Today's Date: 01/11/2018   History of Present Illness  Pt. with PMH of CVA, schizoaffective disorder, HTN, HLD, DM, breast cancer; admitted on 01/07/2018, presented with complaint of confusion, was found to have acute encephalopathy.    Clinical Impression  Pt admitted with above diagnosis. Pt currently with functional limitations due to the deficits listed below (see PT Problem List). Unclear patients level of baseline, appears to be more confused less vocal per RN reports. Today patient total A to bring to sitting EOB with zero sitting balance, will cont to follow for improvement in cognition and mobility, will need 24/7 care.  Pt will benefit from skilled PT to increase their independence and safety with mobility to allow discharge to the venue listed below.       Follow Up Recommendations SNF    Equipment Recommendations  None recommended by PT    Recommendations for Other Services       Precautions / Restrictions Precautions Precautions: Fall Restrictions Weight Bearing Restrictions: Yes      Mobility  Bed Mobility Overal bed mobility: Needs Assistance Bed Mobility: Supine to Sit;Sit to Supine     Supine to sit: Total assist;+2 for physical assistance Sit to supine: Total assist;+2 for physical assistance   General bed mobility comments: total for all aspects  Transfers                 General transfer comment: NT due to zero sitting balance  Ambulation/Gait                Stairs            Wheelchair Mobility    Modified Rankin (Stroke Patients Only)       Balance Overall balance assessment: Needs assistance Sitting-balance support: No upper extremity supported;Feet supported;Single extremity supported Sitting balance-Leahy Scale: Zero Sitting balance - Comments: Unable to significantly use BUE to support self EOB. Heavy posterior lean.  Max-total A for sitting balance (brieffly mod A 1x).                                     Pertinent Vitals/Pain Pain Assessment: No/denies pain    Home Living Family/patient expects to be discharged to:: Skilled nursing facility                      Prior Function Level of Independence: Needs assistance   Gait / Transfers Assistance Needed: patient is poor historian; per patient she required +2 assistance bed to wheelchair; non-ambulatory           Hand Dominance        Extremity/Trunk Assessment   Upper Extremity Assessment Upper Extremity Assessment: Defer to OT evaluation    Lower Extremity Assessment Lower Extremity Assessment: Generalized weakness;Difficult to assess due to impaired cognition    Cervical / Trunk Assessment Cervical / Trunk Assessment: Kyphotic  Communication   Communication: Expressive difficulties  Cognition Arousal/Alertness: Awake/alert Behavior During Therapy: Flat affect;Anxious Overall Cognitive Status: No family/caregiver present to determine baseline cognitive functioning                                 General Comments: Per nurse's report she is more verbal today than yesterday.      General Comments  Exercises     Assessment/Plan    PT Assessment Patient needs continued PT services  PT Problem List Decreased strength;Decreased range of motion;Decreased activity tolerance;Decreased balance;Decreased mobility;Decreased cognition       PT Treatment Interventions DME instruction;Gait training;Functional mobility training;Therapeutic activities;Therapeutic exercise;Balance training    PT Goals (Current goals can be found in the Care Plan section)  Acute Rehab PT Goals Patient Stated Goal: non stated PT Goal Formulation: With patient Time For Goal Achievement: 01/25/18 Potential to Achieve Goals: Fair    Frequency Min 2X/week   Barriers to discharge        Co-evaluation  PT/OT/SLP Co-Evaluation/Treatment: Yes Reason for Co-Treatment: Complexity of the patient's impairments (multi-system involvement);Necessary to address cognition/behavior during functional activity;For patient/therapist safety;To address functional/ADL transfers PT goals addressed during session: Mobility/safety with mobility;Balance;Proper use of DME;Strengthening/ROM         AM-PAC PT "6 Clicks" Mobility  Outcome Measure Help needed turning from your back to your side while in a flat bed without using bedrails?: Total Help needed moving from lying on your back to sitting on the side of a flat bed without using bedrails?: Total Help needed moving to and from a bed to a chair (including a wheelchair)?: Total Help needed standing up from a chair using your arms (e.g., wheelchair or bedside chair)?: Total Help needed to walk in hospital room?: Total Help needed climbing 3-5 steps with a railing? : Total 6 Click Score: 6    End of Session Equipment Utilized During Treatment: Oxygen Activity Tolerance: Patient limited by lethargy;Patient limited by fatigue Patient left: in bed;with call bell/phone within reach;with bed alarm set Nurse Communication: Mobility status PT Visit Diagnosis: Unsteadiness on feet (R26.81);Other abnormalities of gait and mobility (R26.89);Muscle weakness (generalized) (M62.81);Hemiplegia and hemiparesis    Time: 1210-1240 PT Time Calculation (min) (ACUTE ONLY): 30 min   Charges:   PT Evaluation $PT Eval Moderate Complexity: 1 Mod         Reinaldo Berber, PT, DPT Acute Rehabilitation Services Pager: [phone] Office: [phone]    Reinaldo Berber 01/11/2018, 6:09 PM

## 2018-01-11 NOTE — Clinical Social Work Placement (Signed)
   CLINICAL SOCIAL WORK PLACEMENT  NOTE  Date:  01/11/2018  Patient Details  Name: Carolyn Parrish MRN: [id number] Date of Birth: [birth year]  Clinical Social Work is seeking post-discharge placement for this patient at the [place] level of care (*CSW will initial, date and re-position this form in  chart as items are completed):      Patient/family provided with [place] Work Department's list of facilities offering this level of care within the geographic area requested by the patient (or if unable, by the patient's family).  Yes   Patient/family informed of their freedom to choose among providers that offer the needed level of care, that participate in Medicare, Medicaid or managed care program needed by the patient, have an available bed and are willing to accept the patient.      Patient/family informed of [facility]'s ownership interest in [facility] and [facility], as well as of the fact that they are under no obligation to receive care at these facilities.  PASRR submitted to EDS on       PASRR number received on       Existing PASRR number confirmed on 01/11/18     FL2 transmitted to all facilities in geographic area requested by pt/family on 01/11/18     FL2 transmitted to all facilities within larger geographic area on       Patient informed that his/her managed care company has contracts with or will negotiate with certain facilities, including the following:            Patient/family informed of bed offers received.  Patient chooses bed at [facility] recommends and patient chooses bed at      Patient to be transferred to [facility] on 01/12/18.  Patient to be transferred to facility by [facility]     Patient family notified on 01/11/18 of transfer.  Name of family member notified:  CSW PLEASE LET PT's DAUGHTER KNOW!     PHYSICIAN       Additional Comment:     _______________________________________________ Eileen Stanford, LCSW 01/11/2018, 12:31 PM

## 2018-01-11 NOTE — Evaluation (Signed)
Occupational Therapy Evaluation Patient Details Name: Carolyn Parrish MRN: [id number] DOB: September 02, 1940 Today's Date: 01/11/2018    History of Present Illness Pt. with PMH of CVA, schizoaffective disorder, HTN, HLD, DM, breast cancer; admitted on 01/07/2018, presented with complaint of confusion, was found to have acute encephalopathy.   Clinical Impression   Pt admitted with the above diagnoses and presents with below problem list. Pt will benefit from continued acute OT to address the below listed deficits and maximize independence with basic ADLs prior to d/c back to SNF. Unclear what pt's PLOF with basic ADLs and transfers was. No family present on eval and pt is not a reliable historian. From chart review it seems she was not ambulatory at baseline but transferred to a w/c with assistance. Unsure what level of assistance she normally needs with eating, grooming, bathing, and dressing. She presents as total A with ADLs, +2 total physical assist to come to EOB with zero sitting balance and posterior lean noted. Recommend using Hoyer lift for transferring in/out of bed.     Follow Up Recommendations  SNF    Equipment Recommendations  None recommended by OT    Recommendations for Other Services       Precautions / Restrictions Precautions Precautions: Fall      Mobility Bed Mobility Overal bed mobility: Needs Assistance Bed Mobility: Supine to Sit;Sit to Supine     Supine to sit: Total assist;+2 for physical assistance Sit to supine: Total assist;+2 for physical assistance   General bed mobility comments: total for all aspects  Transfers                 General transfer comment: NT due to zero sitting balance    Balance Overall balance assessment: Needs assistance Sitting-balance support: No upper extremity supported;Feet supported;Single extremity supported Sitting balance-Leahy Scale: Zero Sitting balance - Comments: Unable to significantly use BUE to support  self EOB. Heavy posterior lean. Max-total A for sitting balance (brieffly mod A 1x).                                   ADL either performed or assessed with clinical judgement   ADL Overall ADL's : Needs assistance/impaired Eating/Feeding: Total assistance;Bed level   Grooming: Total assistance   Upper Body Bathing: Total assistance   Lower Body Bathing: Total assistance;+2 for physical assistance   Upper Body Dressing : Total assistance   Lower Body Dressing: Total assistance;+2 for physical assistance                 General ADL Comments: Pt seeking total assistance from therapist for drinking/eating. Baseline? Able to briefly bring washcloth to side of face with R hand.  Max-total assist EOB (briefly mod A).     Vision         Perception     Praxis      Pertinent Vitals/Pain Pain Assessment: No/denies pain     Hand Dominance     Extremity/Trunk Assessment Upper Extremity Assessment Upper Extremity Assessment: RUE deficits/detail;LUE deficits/detail;Difficult to assess due to impaired cognition RUE Deficits / Details: limited AROM throughout. Able to briefly bring washcloth to side of face. Difficult to fully assess due to impaired cognition/behavior. Actively providing some resistance to ROM. LUE Deficits / Details: Noted to reach a little for EOB while therapist scooting hips to full EOB position. Did not otherwise move functionally. Difficult to fully assess due to impaired  cognition/behavior. Actively providing some resistance to ROM   Lower Extremity Assessment Lower Extremity Assessment: Defer to PT evaluation   Cervical / Trunk Assessment Cervical / Trunk Assessment: Kyphotic   Communication Communication Communication: Expressive difficulties   Cognition Arousal/Alertness: Awake/alert Behavior During Therapy: Flat affect;Anxious Overall Cognitive Status: No family/caregiver present to determine baseline cognitive functioning                                  General Comments: Per nurse's report she is more verbal today than yesterday.   General Comments       Exercises     Shoulder Instructions      Home Living Family/patient expects to be discharged to:: Skilled nursing facility                                        Prior Functioning/Environment Level of Independence: Needs assistance  Gait / Transfers Assistance Needed: patient is poor historian; per patient she required +2 assistance bed to wheelchair; non-ambulatory              OT Problem List: Decreased strength;Decreased range of motion;Decreased activity tolerance;Impaired balance (sitting and/or standing);Decreased coordination;Decreased cognition;Decreased safety awareness;Decreased knowledge of use of DME or AE;Decreased knowledge of precautions;Impaired tone;Impaired UE functional use;Pain      OT Treatment/Interventions: Self-care/ADL training;Therapeutic exercise;DME and/or AE instruction;Energy conservation;Therapeutic activities;Cognitive remediation/compensation;Patient/family education;Balance training    OT Goals(Current goals can be found in the care plan section) Acute Rehab OT Goals OT Goal Formulation: Patient unable to participate in goal setting Time For Goal Achievement: 01/25/18 Potential to Achieve Goals: Good ADL Goals Pt Will Perform Eating: with max assist;bed level;sitting Pt Will Perform Grooming: with max assist;sitting;bed level Pt Will Perform Upper Body Bathing: with max assist;sitting Pt Will Perform Upper Body Dressing: with max assist;sitting;bed level  OT Frequency: Min 2X/week   Barriers to D/C:            Co-evaluation PT/OT/SLP Co-Evaluation/Treatment: Yes Reason for Co-Treatment: Complexity of the patient's impairments (multi-system involvement);Necessary to address cognition/behavior during functional activity;For patient/therapist safety;To address functional/ADL  transfers   OT goals addressed during session: ADL's and self-care;Strengthening/ROM      AM-PAC OT "6 Clicks" Daily Activity     Outcome Measure Help from another person eating meals?: Total Help from another person taking care of personal grooming?: Total Help from another person toileting, which includes using toliet, bedpan, or urinal?: Total Help from another person bathing (including washing, rinsing, drying)?: Total Help from another person to put on and taking off regular upper body clothing?: Total Help from another person to put on and taking off regular lower body clothing?: Total 6 Click Score: 6   End of Session Nurse Communication: Mobility status  Activity Tolerance: Patient tolerated treatment well Patient left: in bed;with call bell/phone within reach;with bed alarm set  OT Visit Diagnosis: Pain;Muscle weakness (generalized) (M62.81);Other abnormalities of gait and mobility (R26.89);Other symptoms and signs involving cognitive function;Cognitive communication deficit (R41.841)                Time: 4627-0350 OT Time Calculation (min): 37 min Charges:  OT General Charges $OT Visit: 1 Visit OT Evaluation $OT Eval Moderate Complexity: [place], OT Acute Rehabilitation Services Pager: [phone] Office: [phone]   Carolyn Parrish 01/11/2018, 12:50 PM

## 2018-01-12 LAB — GLUCOSE, CAPILLARY: Glucose-Capillary: 114 mg/dL — ABNORMAL HIGH (ref 70–99)

## 2018-01-12 LAB — BASIC METABOLIC PANEL
Anion gap: 9 (ref 5–15)
BUN: 7 mg/dL — ABNORMAL LOW (ref 8–23)
CHLORIDE: 108 mmol/L (ref 98–111)
CO2: 27 mmol/L (ref 22–32)
Calcium: 8.8 mg/dL — ABNORMAL LOW (ref 8.9–10.3)
Creatinine, Ser: 0.76 mg/dL (ref 0.44–1.00)
GFR calc Af Amer: >60 mL/min (ref >60)
GFR calc non Af Amer: >60 mL/min (ref >60)
Glucose, Bld: 109 mg/dL — ABNORMAL HIGH (ref 70–99)
Potassium: 3.7 mmol/L (ref 3.5–5.1)
SODIUM: 144 mmol/L (ref 135–145)

## 2018-01-12 LAB — CBC
HEMATOCRIT: 39.1 % (ref 36.0–46.0)
HEMOGLOBIN: 11.4 g/dL — AB (ref 12.0–15.0)
MCH: 28.4 pg (ref 26.0–34.0)
MCHC: 29.2 g/dL — ABNORMAL LOW (ref 30.0–36.0)
MCV: 97.3 fL (ref 80.0–100.0)
Platelets: 328 10*3/uL (ref 150–400)
RBC: 4.02 MIL/uL (ref 3.87–5.11)
RDW: 15.9 % — ABNORMAL HIGH (ref 11.5–15.5)
WBC: 10.7 10*3/uL — ABNORMAL HIGH (ref 4.0–10.5)
nRBC: 0 % (ref 0.0–0.2)

## 2018-01-12 MED ORDER — OLANZAPINE 5 MG PO TABS: 5.0000 mg | ORAL_TABLET | Freq: Every day | ORAL | 0 refills | Status: DC

## 2018-01-12 MED ORDER — ENSURE ENLIVE PO LIQD: 237.0000 mL | Freq: Three times a day (TID) | ORAL | 12 refills | Status: DC

## 2018-01-12 MED ORDER — OXYCODONE HCL 5 MG PO TABS: 5.0000 mg | ORAL_TABLET | Freq: Four times a day (QID) | ORAL | 0 refills | Status: DC | PRN

## 2018-01-12 NOTE — Discharge Summary (Signed)
[facility] Discharge Summary   Patient: Carolyn Parrish VQX:[id number]   PCP: Hendricks Limes, MD DOB: 10-Nov-1940   Date of admission: 01/07/2018   Date of discharge:  01/12/2018    Discharge Diagnoses:  Principal Problem:   Acute encephalopathy Active Problems:   Malignant neoplasm of upper-inner quadrant of left breast in female, estrogen receptor positive ([place])   CVA (cerebrovascular accident) ([place])   Schizoaffective disorder ([place])   Breast cancer, stage 2, left ([place])   Diabetes mellitus treated with oral medication ([place])   Acute hypernatremia  Admitted From: SNF Disposition:  SNF  Recommendations for Outpatient Follow-up:  1. Please follow up with PCP in 1 week   Follow-up Information    Hendricks Limes, MD. Schedule an appointment as soon as possible for a visit in 1 week(s).   Specialty:  Internal Medicine Contact information: [place] [address] [phone]          Diet recommendation: soft diet  Activity: The patient is advised to gradually reintroduce usual activities.  Discharge Condition: good  Code Status: full code  History of present illness: As per the H and P dictated on admission, "Carolyn Parrish is a 77 y.o. 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  There was no family present and the patient was unable to provide history.  Her daughter is concerned about a UTI - this has been the cause of AMS in the past.  The only recent med change has been Arimidex.    ED Course:  Carryover, per Dr. Myna Hidalgo:  69 yof with hx of psychosis, presents from SNF with several days progressive AMS. Urine does not look infected, but reportedly had similar presentation with similar UA where she ended up septic from UTI. Treated empirically with Rocephin. "  Hospital Course:  Summary  of her active problems in the hospital is as following.  1.  Acute encephalopathy. Schizoaffective disorder,nonsensical with vocal outbursts  Hypernatremia, asymptomatic bacteriuria  Probably combined psychosis and metabolic with poor PO intake Per family the patient communicates normally, is able to feed her, ambulate at her baseline. Patient still remains weak and lethargic, not able to follow any commands on admission now able to follow some commands. Agitation has resolved.  Still needing +2 assistance and hoyer lift. Hypernatremia has improved as well with IV D5 infusion. He had unremarkable for any acute abnormality. Chest x-ray negative for pneumonia.  UA has pyuria but urine culture is negative. No focal deficit on examination. Zyprexa added.  Initially was on 5 twice daily, now tolerating 5 nightly dose. Appreciate psychiatry input. Continue Depakote 250 mg daily. Metabolic work-up as well as repeat CT scan negative for any acute abnormality.  2.  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 Recently started on anastrozole. There is some concern that this medication is associated with patient's agitation. Less likely the cause per oncology and would recommend to continue this medication given patient's high risk for recurrence. Radiation oncology was notified about patient's admission as well.  3.  Type 2 diabetes mellitus.  Uncontrolled with hypoglycemia.  No complication. On metformin at home. Last hemoglobin A1c 6.3. Initially was on sliding scale insulin, due to hypoglycemia and minimal oral intake with encephalopathy, dextrose was added to IV fluids. Now sodium is better, glucose remains 110   4.  Essential hypertension.  Blood pressure mildly elevated, now better. Likely in the setting of agitation.  5.  Leukocytosis. Likely stress reaction.  Now resolved Cultures are so far negative.  6.obsesity Body mass index is 30.87 kg/m.   7.  History  of CVA. Continue 81 mg aspirin  8.  Hypokalemia. Replaced  All other chronic medical condition were stable during the hospitalization.  Patient was seen by physical therapy, who recommended SNF, which was arranged by Education officer, museum and case Freight forwarder. On the day of the discharge the patient's vitals were stable, and no other acute medical condition were reported by patient. the patient was felt safe to be discharge at SNF with therapy.  Consultants: psychiatry, oncology  Procedures: none  DISCHARGE MEDICATION: Allergies as of 01/12/2018      Reactions   Atorvastatin    UNSPECIFIED REACTION       Medication List    TAKE these medications   acetaminophen 325 MG tablet Commonly known as:  TYLENOL Take 325-650 mg by mouth every 4 (four) hours as needed (668m for arthritis pain, 3221mfor fever >101).   anastrozole 1 MG tablet Commonly known as:  ARIMIDEX Take 1 tablet (1 mg total) by mouth daily.   aspirin EC 81 MG tablet Take 81 mg by mouth daily.   bisacodyl 10 MG suppository Commonly known as:  DULCOLAX Place 10 mg rectally as needed for moderate constipation.   camphor-menthol lotion Commonly known as:  SARNA Apply 1 application topically 2 (two) times daily as needed for itching.   divalproex 250 MG 24 hr tablet Commonly known as:  DEPAKOTE ER Take 250 mg by mouth daily.   docusate sodium 100 MG capsule Commonly known as:  COLACE Take 100 mg by mouth 2 (two) times daily.   feeding supplement (ENSURE ENLIVE) Liqd Take 237 mLs by mouth 3 (three) times daily between meals.   metFORMIN 500 MG tablet Commonly known as:  GLUCOPHAGE Take 500 mg by mouth daily.   MYLANTA 200-200-20 MG/5ML suspension Generic drug:  alum & mag hydroxide-simeth Take 30 mLs by mouth every 4 (four) hours as needed for indigestion or heartburn.   OLANZapine 5 MG tablet Commonly known as:  ZYPREXA Take 1 tablet (5 mg total) by mouth at bedtime.   oxyCODONE 5 MG immediate release  tablet Commonly known as:  Oxy IR/ROXICODONE Take 1 tablet (5 mg total) by mouth every 6 (six) hours as needed for moderate pain. What changed:  when to take this   polyethylene glycol packet Commonly known as:  MIRALAX / GLYCOLAX Take 17 g by mouth daily.   RA SALINE ENEMA 19-7 GM/118ML Enem Place 1 each rectally as needed (for constipation).      Allergies  Allergen Reactions  . Atorvastatin     UNSPECIFIED REACTION    Discharge Instructions    Diet - low sodium heart healthy   Complete by:  As directed    Discharge instructions   Complete by:  As directed    It is important that you read following instructions as well as go over your medication list with RN to help you understand your care after this hospitalization.  Discharge Instructions: Please follow-up with PCP in one week  Please request your primary care physician to go over all Hospital Tests and Procedure/Radiological results at the follow up,  Please get all Hospital records sent to your PCP by signing hospital release before you go home.   Do not take more than prescribed Pain, Sleep and Anxiety  Medications. You were cared for by a hospitalist during your hospital stay. If you have any questions about your discharge medications or the care you received while you were in the hospital after you are discharged, you can call the unit you were admitted to and ask to speak with the hospitalist on call if the hospitalist that took care of you is not available.  Once you are discharged, your primary care physician will handle any further medical issues. Please note that NO REFILLS for any discharge medications will be authorized once you are discharged, as it is imperative that you return to your primary care physician (or establish a relationship with a primary care physician if you do not have one) for your aftercare needs so that they can reassess your need for medications and monitor your lab values. You Must read  complete instructions/literature along with all the possible adverse reactions/side effects for all the Medicines you take and that have been prescribed to you. Take any new Medicines after you have completely understood and accept all the possible adverse reactions/side effects. Wear Seat belts while driving   Increase activity slowly   Complete by:  As directed      Discharge Exam: Filed Weights   01/08/18 1321  Weight: 97.6 kg   Vitals:   01/11/18 2320 01/12/18 0726  BP: (!) 141/80 (!) 151/79  Pulse: 89 83  Resp: 15   Temp: 98.6 F (37 C) 98.3 F (36.8 C)  SpO2: 99% 100%   General: Appear in no distress, no Rash; Oral Mucosa moist. Cardiovascular: S1 and S2 Present, no Murmur, no JVD Respiratory: Bilateral Air entry present and Clear to Auscultation, no Crackles, no wheezes Abdomen: Bowel Sound present, Soft and no tenderness Extremities: no Pedal edema, no calf tenderness Neurology: Grossly no focal neuro deficit.  The results of significant diagnostics from this hospitalization (including imaging, microbiology, ancillary and laboratory) are listed below for reference.    Significant Diagnostic Studies: Ct Head Wo Contrast  Result Date: 01/10/2018 CLINICAL DATA:  77 y/o  F; acute encephalopathy. EXAM: CT HEAD WITHOUT CONTRAST TECHNIQUE: Contiguous axial images were obtained from the base of the skull through the vertex without intravenous contrast. COMPARISON:  01/08/2018 CT head. FINDINGS: Brain: No evidence of acute infarction, hemorrhage, hydrocephalus, extra-axial collection or mass lesion/mass effect. Stable mild chronic microvascular ischemic changes of the brain and supratentorial volume loss. Asymmetric advanced volume loss of the cerebellum. Vascular: Calcific atherosclerosis of carotid siphons. No hyperdense vessel identified. Skull: Normal. Negative for fracture or focal lesion. Sinuses/Orbits: No acute finding. Other: None. IMPRESSION: 1. No acute intracranial  abnormality identified. 2. Stable mild chronic microvascular ischemic changes of the brain and supratentorial volume loss. 3. Asymmetric cerebellar volume loss. This may be due to alcoholic cerebellar degeneration, phenytoin/lithium toxicity, sequelae of cerebellitis, associated with an inherited ataxia syndrome, or represent a neurodegenerate disorder such as multi-system atrophy. Electronically Signed   By: Kristine Garbe M.D.   On: 01/10/2018 14:38   Ct Head Wo Contrast  Result Date: 01/08/2018 CLINICAL DATA:  77 year old female with altered mental status. EXAM: CT HEAD WITHOUT CONTRAST TECHNIQUE: Contiguous axial images were obtained from the base of the skull through the vertex without intravenous contrast. COMPARISON:  None. FINDINGS: Brain: The ventricles and sulci appropriate size for patient's age. Mild periventricular and deep white matter chronic microvascular ischemic changes noted. There is no acute intracranial hemorrhage. No mass effect or midline shift. No extra-axial fluid collection. There is mild cerebellar atrophy. Vascular: No  hyperdense vessel or unexpected calcification. Skull: Normal. Negative for fracture or focal lesion. Sinuses/Orbits: No acute finding. Other: None IMPRESSION: 1. No acute intracranial hemorrhage. 2. Mild age-related atrophy and chronic microvascular ischemic changes. Electronically Signed   By: Anner Crete M.D.   On: 01/08/2018 00:40   Ct Abdomen Pelvis W Contrast  Result Date: 01/08/2018 CLINICAL DATA:  77 year old female with generalized abdominal pain. EXAM: CT ABDOMEN AND PELVIS WITH CONTRAST TECHNIQUE: Multidetector CT imaging of the abdomen and pelvis was performed using the standard protocol following bolus administration of intravenous contrast. CONTRAST:  127m OMNIPAQUE IOHEXOL 300 MG/ML  SOLN COMPARISON:  None. FINDINGS: Evaluation of this exam is limited due to respiratory motion artifact. Lower chest: There bibasilar dependent  atelectasis of the visualized lung bases. No intra-abdominal free air or free fluid. Hepatobiliary: Apparent fatty infiltration of the liver. No intrahepatic biliary ductal dilatation. Cholecystectomy. A biliary stent is noted in the CBD. Pancreas: Unremarkable. No pancreatic ductal dilatation or surrounding inflammatory changes. Spleen: Normal in size without focal abnormality. Adrenals/Urinary Tract: Adrenal glands are unremarkable. Kidneys are normal, without renal calculi, focal lesion, or hydronephrosis. Bladder is unremarkable. Stomach/Bowel: There is redundancy of the sigmoid colon. There are scattered colonic diverticula without active inflammatory changes. Mild gaseous distention of the colon. There is no bowel obstruction or active inflammation. Normal appendix. Vascular/Lymphatic: Mild aortoiliac atherosclerotic disease. No portal venous gas. There is no adenopathy. Reproductive: The uterus is anteverted and grossly unremarkable. The ovaries are grossly unremarkable as visualized. Other: None Musculoskeletal: Degenerative changes of the spine. No acute osseous pathology. IMPRESSION: 1. No acute intra-abdominal or pelvic pathology. Mild gaseous distention of the colon. No bowel obstruction or active inflammation. Normal appendix. 2. Cholecystectomy with CBD stent in place. No intrahepatic biliary ductal dilatation. 3. Fatty liver. Electronically Signed   By: AAnner CreteM.D.   On: 01/08/2018 04:21   Dg Chest Port 1 View  Result Date: 01/08/2018 CLINICAL DATA:  Altered mental status, shortness of breath. History of breast cancer. EXAM: PORTABLE CHEST 1 VIEW COMPARISON:  Chest radiograph November 14, 2017 FINDINGS: Cardiac silhouette is mildly enlarged and unchanged. Similar fullness of the mediastinum, potentially a projectional in this low inspiratory portable examination with crowded vascular markings, no pleural effusion or focal consolidation. No pneumothorax. Soft tissue planes and included  osseous structures are nonacute. IMPRESSION: Mild cardiomegaly. No acute pulmonary process in this low inspiratory examination. Electronically Signed   By: CElon AlasM.D.   On: 01/08/2018 00:26    Microbiology: Recent Results (from the past 240 hour(s))  Urine Culture     Status: None   Collection Time: 01/08/18 12:12 AM  Result Value Ref Range Status   Specimen Description URINE, CATHETERIZED  Final   Special Requests NONE  Final   Culture   Final    NO GROWTH Performed at [facility] [address], [place] [address]   Report Status 01/09/2018 FINAL  Final     Labs: CBC: Recent Labs  Lab 01/08/18 0015 01/09/18 0249 01/10/18 0250 01/11/18 0229 01/12/18 0208  WBC 14.7* 16.2* 11.2* 10.4 10.7*  NEUTROABS 8.4*  --  6.9  --   --   HGB 14.0 12.5 12.0 11.4* 11.4*  HCT 45.9 42.8 40.5 38.8 39.1  MCV 95.2 97.7 99.3 98.0 97.3  PLT 302 323 284 268 [id number]  Basic Metabolic Panel: Recent Labs  Lab 01/08/18 0015 01/09/18 0249 01/10/18 0250 01/11/18 0229 01/12/18 0208  NA 141 147* 144 145  144  K 3.7 4.0 3.8 3.4* 3.7  CL 102 108 108 111 108  CO2 24 21* _0 GLUCOSE 109* 98 107* 150* 109*  BUN _1 7*  CREATININE 0.99 1.07* 1.03* 0.89 0.76  CALCIUM 9.9 9.3 8.7* 8.5* 8.8*   Liver Function Tests: Recent Labs  Lab 01/08/18 0015 01/10/18 0250  AST 21 21  ALT 23 21  ALKPHOS 64 50  BILITOT 0.9 1.1  PROT 7.8 6.9  ALBUMIN 3.9 3.1*   Recent Labs  Lab 01/08/18 0015  LIPASE 23   Recent Labs  Lab 01/08/18 0015 01/10/18 1259  AMMONIA 27 30   Cardiac Enzymes: No results for input(s): CKTOTAL, CKMB, CKMBINDEX, TROPONINI in the last 168 hours. BNP (last 3 results) No results for input(s): BNP in the last 8760 hours. CBG: Recent Labs  Lab 01/11/18 0718 01/11/18 1128 01/11/18 1613 01/11/18 2125 01/12/18 0726  GLUCAP 119* 114* 88 140* 114*   Time spent: 35 minutes  Signed:  Berle Mull  [facility]  01/12/2018  , 11:53  AM

## 2018-01-12 NOTE — Progress Notes (Signed)
Patient will Discharge To: [facility] Anticipated DC Date:01/12/18 Family Notified:yes, Ellan Tess, daughter, [phone] Transport [facility]   Per MD patient ready for DC to [facility] . RN, patient, patient's family, and facility notified of DC. Assessment, Fl2/Pasrr, and Discharge Summary sent to facility. RN given number for report [phone], Room # 202A). DC packet on chart. Ambulance transport requested for patient.   CSW signing off.  Reed Breech LCSWA [phone]

## 2018-01-14 ENCOUNTER — Telehealth: Payer: Self-pay | Admitting: Radiation Oncology

## 2018-01-14 ENCOUNTER — Ambulatory Visit
Admission: RE | Admit: 2018-01-14 | Discharge: 2018-01-14 | Disposition: A | Discharge status: Home / Self Care | Payer: Medicare Other | Source: Ambulatory Visit | Attending: Radiation Oncology | Admitting: Radiation Oncology

## 2018-01-14 DIAGNOSIS — Z170 Estrogen receptor positive status [ER+]: Secondary | ICD-10-CM | POA: Diagnosis not present

## 2018-01-14 DIAGNOSIS — C50212 Malignant neoplasm of upper-inner quadrant of left female breast: Secondary | ICD-10-CM | POA: Diagnosis not present

## 2018-01-14 DIAGNOSIS — F258 Other schizoaffective disorders: Secondary | ICD-10-CM

## 2018-01-14 NOTE — Telephone Encounter (Signed)
I spoke with the patient's daughter and we will try to proceed today. We also discussed need to follow up with psychiatry as her mom does not have any ongoing mental health provider to manage outpatient care. Referral was placed to the provider who saw her inpt.

## 2018-01-15 ENCOUNTER — Non-Acute Institutional Stay (SKILLED_NURSING_FACILITY): Payer: Medicare Other

## 2018-01-15 ENCOUNTER — Ambulatory Visit
Admission: RE | Admit: 2018-01-15 | Discharge: 2018-01-15 | Disposition: A | Discharge status: Home / Self Care | Payer: Medicare Other | Source: Ambulatory Visit | Attending: Radiation Oncology | Admitting: Radiation Oncology

## 2018-01-15 ENCOUNTER — Non-Acute Institutional Stay (SKILLED_NURSING_FACILITY): Payer: Medicare Other | Admitting: Internal Medicine

## 2018-01-15 ENCOUNTER — Encounter: Payer: Self-pay | Admitting: Internal Medicine

## 2018-01-15 DIAGNOSIS — E1151 Type 2 diabetes mellitus with diabetic peripheral angiopathy without gangrene: Secondary | ICD-10-CM | POA: Diagnosis not present

## 2018-01-15 DIAGNOSIS — G9340 Encephalopathy, unspecified: Secondary | ICD-10-CM | POA: Diagnosis not present

## 2018-01-15 DIAGNOSIS — C50912 Malignant neoplasm of unspecified site of left female breast: Secondary | ICD-10-CM | POA: Diagnosis not present

## 2018-01-15 DIAGNOSIS — Z0000 Encounter for general adult medical examination without abnormal findings: Secondary | ICD-10-CM | POA: Diagnosis not present

## 2018-01-15 DIAGNOSIS — C50212 Malignant neoplasm of upper-inner quadrant of left female breast: Secondary | ICD-10-CM | POA: Diagnosis not present

## 2018-01-15 NOTE — Assessment & Plan Note (Signed)
Psych NP will follow here at the facility

## 2018-01-15 NOTE — Progress Notes (Signed)
Subjective:   Carolyn Parrish is a 77 y.o. female who presents for an Initial Medicare Annual Wellness Visit at [facility]      Objective:    Today's Vitals   01/15/18 1119  BP: 140/65  Pulse: 80  Temp: (!) 97.3 F (36.3 C)  TempSrc: Oral  Weight: 215 lb (97.5 kg)  Height: 5\' 10"  (1.778 m)   Body mass index is 30.85 kg/m.  Advanced Directives 01/15/2018 01/08/2018 01/07/2018 12/27/2017 11/20/2017 10/31/2017 10/24/2017  Does Patient Have a Medical Advance Directive? No No - No Yes Yes Yes  Type of Advance Directive Living will - Living will Living will [place];Living will [place];Living will [place];Living will  Does patient want to make changes to medical advance directive? - - - - No - Patient declined No - Patient declined No - Patient declined  Copy of [place] in Chart? - - - - No - copy requested No - copy requested No - copy requested  Would patient like information on creating a medical advance directive? No - Patient declined No - Patient declined - No - Patient declined - - -    Current Medications (verified) Outpatient Encounter Medications as of 01/15/2018  Medication Sig  . acetaminophen (TYLENOL) 325 MG tablet Take 325-650 mg by mouth every 4 (four) hours as needed (650mg  for arthritis pain, 325mg  for fever >101).   Marland Kitchen alum & mag hydroxide-simeth (MYLANTA) 200-200-20 MG/5ML suspension Take 30 mLs by mouth every 4 (four) hours as needed for indigestion or heartburn.  . anastrozole (ARIMIDEX) 1 MG tablet Take 1 tablet (1 mg total) by mouth daily.  Marland Kitchen aspirin EC 81 MG tablet Take 81 mg by mouth daily.  . bisacodyl (DULCOLAX) 10 MG suppository Place 10 mg rectally as needed for moderate constipation.  . camphor-menthol (SARNA) lotion Apply 1 application topically 2 (two) times daily as needed for itching.  . divalproex (DEPAKOTE ER) 250 MG 24 hr tablet Take 250 mg by mouth  daily.  Marland Kitchen docusate sodium (COLACE) 100 MG capsule Take 100 mg by mouth 2 (two) times daily.  . feeding supplement, ENSURE ENLIVE, (ENSURE ENLIVE) LIQD Take 237 mLs by mouth 3 (three) times daily between meals.  . metFORMIN (GLUCOPHAGE) 500 MG tablet Take 500 mg by mouth daily.  Marland Kitchen OLANZapine (ZYPREXA) 5 MG tablet Take 1 tablet (5 mg total) by mouth at bedtime.  Marland Kitchen oxyCODONE (OXY IR/ROXICODONE) 5 MG immediate release tablet Take 1 tablet (5 mg total) by mouth every 6 (six) hours as needed for moderate pain.  . polyethylene glycol (MIRALAX / GLYCOLAX) packet Take 17 g by mouth daily.  . Sodium Phosphates (RA SALINE ENEMA) 19-7 GM/118ML ENEM Place 1 each rectally as needed (for constipation).   No facility-administered encounter medications on file as of 01/15/2018.     Allergies (verified) Atorvastatin   History: Past Medical History:  Diagnosis Date  . Cancer (HCC)    BREAST CANCER  . CHF (congestive heart failure) ([place])   . Diabetes mellitus without complication ([place])   . Hyperlipidemia   . Hypertension   . Schizoaffective disorder ([place])   . Stroke [facility])    Past Surgical History:  Procedure Laterality Date  . CHOLECYSTECTOMY     2016  . MASTECTOMY COMPLETE / SIMPLE Left 10/30/2017  . SIMPLE MASTECTOMY WITH AXILLARY SENTINEL NODE BIOPSY Left 10/30/2017   Procedure: LEFT SIMPLE MASTECTOMY;  Surgeon: Erroll Luna, MD;  Location:  [facility] OR;  Service: General;  Laterality: Left;   History reviewed. No pertinent family history. Social History   Socioeconomic History  . Marital status: Widowed    Spouse name: Not on file  . Number of children: Not on file  . Years of education: Not on file  . Highest education level: Not on file  Occupational History  . Not on file  Social Needs  . Financial resource strain: Not hard at all  . Food insecurity:    Worry: Never true    Inability: Never true  . Transportation needs:    Medical: No    Non-medical: No  Tobacco Use  . Smoking  status: Never Smoker  . Smokeless tobacco: Never Used  Substance and Sexual Activity  . Alcohol use: Not Currently  . Drug use: Never  . Sexual activity: Not on file  Lifestyle  . Physical activity:    Days per week: 0 days    Minutes per session: 0 min  . Stress: To some extent  Relationships  . Social connections:    Talks on phone: More than three times a week    Gets together: More than three times a week    Attends religious service: Never    Active member of club or organization: No    Attends meetings of clubs or organizations: Never    Relationship status: Widowed  Other Topics Concern  . Not on file  Social History Narrative  . Not on file    Tobacco Counseling Counseling given: Not Answered   Clinical Intake:  Pre-visit preparation completed: No  Pain : No/denies pain     Diabetes: Yes CBG done?: No Did pt. bring in CBG monitor from home?: No  How often do you need to have someone help you when you read instructions, pamphlets, or other written materials from your doctor or pharmacy?: 3 - Sometimes  Interpreter Needed?: No  Information entered by :: Tyson Dense, RN   Activities of Daily Living In your present state of health, do you have any difficulty performing the following activities: 01/15/2018 01/08/2018  Hearing? N N  Vision? N N  Difficulty concentrating or making decisions? Tempie Donning  Walking or climbing stairs? Y Y  Dressing or bathing? Y Y  Doing errands, shopping? Tempie Donning  Preparing Food and eating ? Y -  Using the Toilet? Y -  In the past six months, have you accidently leaked urine? Y -  Do you have problems with loss of bowel control? Y -  Managing your Medications? Y -  Managing your Finances? Y -  Housekeeping or managing your Housekeeping? Y -     Immunizations and Health Maintenance Immunization History  Administered Date(s) Administered  . Influenza-Unspecified 12/18/2017  . Pneumococcal Conjugate-13 01/17/2016  .  Pneumococcal Polysaccharide-23 09/18/2014   Health Maintenance Due  Topic Date Due  . FOOT EXAM  09/15/1950  . OPHTHALMOLOGY EXAM  09[birth year]15/1950  . URINE MICROALBUMIN  09/15/1952  . TETANUS/TDAP  09/15/1961  . COLONOSCOPY  09/15/1992  . DEXA SCAN  09/16/2007    Patient Care Team: Hendricks Limes, MD as PCP - General (Internal Medicine) Erroll Luna, MD as Consulting Physician (General Surgery) Magrinat, Virgie Dad, MD as Consulting Physician (Oncology) Kyung Rudd, MD as Consulting Physician (Radiation Oncology) Estevan Oaks, NP as Nurse Practitioner [facility])  Indicate any recent Medical Services you may have received from other than [facility] providers in the past year (date may be approximate).  Assessment:   This is a routine wellness examination for Carolyn Parrish.  Hearing/Vision screen No exam data present  Dietary issues and exercise activities discussed: Current Exercise Habits: The patient does not participate in regular exercise at present, Exercise limited by: orthopedic condition(s)  Goals   None    Depression Screen PHQ 2/9 Scores 01/15/2018  PHQ - 2 Score 6  PHQ- 9 Score 13    Fall Risk Fall Risk  01/15/2018  Falls in the past year? 0  Number falls in past yr: 0  Injury with Fall? 0    Is the patient's home free of loose throw rugs in walkways, pet beds, electrical cords, etc?   yes      Grab bars in the bathroom? yes      Handrails on the stairs?   yes      Adequate lighting?   yes  Cognitive Function:     6CIT Screen 01/15/2018  What Year? 4 points  What month? 3 points  What time? 3 points  Count back from 20 4 points  Months in reverse 4 points  Repeat phrase 2 points  Total Score 20    Screening Tests Health Maintenance  Topic Date Due  . FOOT EXAM  09/15/1950  . OPHTHALMOLOGY EXAM  09[birth year]15/1952  . URINE MICROALBUMIN  09/15/1952  . TETANUS/TDAP  09/15/1961  . COLONOSCOPY  09/15/1992  . DEXA SCAN  09/16/2007  .  HEMOGLOBIN A1C  04/24/2018  . INFLUENZA VACCINE  Completed  . PNA vac Low Risk Adult  Completed    Qualifies for Shingles Vaccine? Not in past records  Cancer Screenings: Lung: Low Dose CT Chest recommended if Age 86-80 years, 30 pack-year currently smoking OR have quit w/in 15years. Patient does not qualify. Breast: Up to date on Mammogram? Excluded Up to date of Bone Density/Dexa? Excluded, patient is nonambulatory Colorectal: due, declined  Additional Screenings:  Hepatitis C Screening: declined Diabetic eye exam completed 03/29/2017 TDAP due: ordered   Plan:  I have personally reviewed and addressed the Medicare Annual Wellness questionnaire and have noted the following in the patient's chart:  A. Medical and social history B. Use of alcohol, tobacco or illicit drugs  C. Current medications and supplements D. Functional ability and status E.  Nutritional status F.  Physical activity G. Advance directives H. List of other physicians I.  Hospitalizations, surgeries, and ER visits in previous 12 months J.  [place] to include hearing, vision, cognitive, depression L. Referrals and appointments - none  In addition, I have reviewed and discussed with patient certain preventive protocols, quality metrics, and best practice recommendations. A written personalized care plan for preventive services as well as general preventive health recommendations were provided to patient.  See attached scanned questionnaire for additional information.   Signed,   Tyson Dense, RN Nurse Health Advisor  Patient Concerns: None

## 2018-01-15 NOTE — Assessment & Plan Note (Signed)
Continue radiation therapy

## 2018-01-15 NOTE — Patient Instructions (Addendum)
Carolyn Parrish , Thank you for taking time to come for your Medicare Wellness Visit. I appreciate your ongoing commitment to your health goals. Please review the following plan we discussed and let me know if I can assist you in the future.   Screening recommendations/referrals: Colonoscopy due, declined Mammogram excluded Bone Density excluded Recommended yearly ophthalmology/optometry visit for glaucoma screening and checkup Recommended yearly dental visit for hygiene and checkup  Vaccinations: Influenza vaccine up to date Pneumococcal vaccine up to date Tdap vaccine due, ordered Shingles vaccine not in past records    Advanced directives: in chart  Conditions/risks identified: fall risk  Next appointment: Dr. Linna Darner makes rounds   Preventive Care 21 Years and Older, Female Preventive care refers to lifestyle choices and visits with your health care provider that can promote health and wellness. What does preventive care include?  A yearly physical exam. This is also called an annual well check.  Dental exams once or twice a year.  Routine eye exams. Ask your health care provider how often you should have your eyes checked.  Personal lifestyle choices, including:  Daily care of your teeth and gums.  Regular physical activity.  Eating a healthy diet.  Avoiding tobacco and drug use.  Limiting alcohol use.  Practicing safe sex.  Taking low-dose aspirin every day.  Taking vitamin and mineral supplements as recommended by your health care provider. What happens during an annual well check? The services and screenings done by your health care provider during your annual well check will depend on your age, overall health, lifestyle risk factors, and family history of disease. Counseling  Your health care provider may ask you questions about your:  Alcohol use.  Tobacco use.  Drug use.  Emotional well-being.  Home and relationship well-being.  Sexual  activity.  Eating habits.  History of falls.  Memory and ability to understand (cognition).  Work and work Statistician.  Reproductive health. Screening  You may have the following tests or measurements:  Height, weight, and BMI.  Blood pressure.  Lipid and cholesterol levels. These may be checked every 5 years, or more frequently if you are over 49 years old.  Skin check.  Lung cancer screening. You may have this screening every year starting at age 43 if you have a 30-pack-year history of smoking and currently smoke or have quit within the past 15 years.  Fecal occult blood test (FOBT) of the stool. You may have this test every year starting at age 36.  Flexible sigmoidoscopy or colonoscopy. You may have a sigmoidoscopy every 5 years or a colonoscopy every 10 years starting at age 20.  Hepatitis C blood test.  Hepatitis B blood test.  Sexually transmitted disease (STD) testing.  Diabetes screening. This is done by checking your blood sugar (glucose) after you have not eaten for a while (fasting). You may have this done every 1-3 years.  Bone density scan. This is done to screen for osteoporosis. You may have this done starting at age 28.  Mammogram. This may be done every 1-2 years. Talk to your health care provider about how often you should have regular mammograms. Talk with your health care provider about your test results, treatment options, and if necessary, the need for more tests. Vaccines  Your health care provider may recommend certain vaccines, such as:  Influenza vaccine. This is recommended every year.  Tetanus, diphtheria, and acellular pertussis (Tdap, Td) vaccine. You may need a Td booster every 10 years.  Zoster vaccine.  You may need this after age 51.  Pneumococcal 13-valent conjugate (PCV13) vaccine. One dose is recommended after age 2.  Pneumococcal polysaccharide (PPSV23) vaccine. One dose is recommended after age 84. Talk to your health care  provider about which screenings and vaccines you need and how often you need them. This information is not intended to replace advice given to you by your health care provider. Make sure you discuss any questions you have with your health care provider. Document Released: 02/19/2015 Document Revised: 10/13/2015 Document Reviewed: 11/24/2014 Elsevier Interactive Patient Education  2017 [place] Prevention in the Home Falls can cause injuries. They can happen to people of all ages. There are many things you can do to make your home safe and to help prevent falls. What can I do on the outside of my home?  Regularly fix the edges of walkways and driveways and fix any cracks.  Remove anything that might make you trip as you walk through a door, such as a raised step or threshold.  Trim any bushes or trees on the path to your home.  Use bright outdoor lighting.  Clear any walking paths of anything that might make someone trip, such as rocks or tools.  Regularly check to see if handrails are loose or broken. Make sure that both sides of any steps have handrails.  Any raised decks and porches should have guardrails on the edges.  Have any leaves, snow, or ice cleared regularly.  Use sand or salt on walking paths during winter.  Clean up any spills in your garage right away. This includes oil or grease spills. What can I do in the bathroom?  Use night lights.  Install grab bars by the toilet and in the tub and shower. Do not use towel bars as grab bars.  Use non-skid mats or decals in the tub or shower.  If you need to sit down in the shower, use a plastic, non-slip stool.  Keep the floor dry. Clean up any water that spills on the floor as soon as it happens.  Remove soap buildup in the tub or shower regularly.  Attach bath mats securely with double-sided non-slip rug tape.  Do not have throw rugs and other things on the floor that can make you trip. What can I do in  the bedroom?  Use night lights.  Make sure that you have a light by your bed that is easy to reach.  Do not use any sheets or blankets that are too big for your bed. They should not hang down onto the floor.  Have a firm chair that has side arms. You can use this for support while you get dressed.  Do not have throw rugs and other things on the floor that can make you trip. What can I do in the kitchen?  Clean up any spills right away.  Avoid walking on wet floors.  Keep items that you use a lot in easy-to-reach places.  If you need to reach something above you, use a strong step stool that has a grab bar.  Keep electrical cords out of the way.  Do not use floor polish or wax that makes floors slippery. If you must use wax, use non-skid floor wax.  Do not have throw rugs and other things on the floor that can make you trip. What can I do with my stairs?  Do not leave any items on the stairs.  Make sure that there are handrails on  both sides of the stairs and use them. Fix handrails that are broken or loose. Make sure that handrails are as long as the stairways.  Check any carpeting to make sure that it is firmly attached to the stairs. Fix any carpet that is loose or worn.  Avoid having throw rugs at the top or bottom of the stairs. If you do have throw rugs, attach them to the floor with carpet tape.  Make sure that you have a light switch at the top of the stairs and the bottom of the stairs. If you do not have them, ask someone to add them for you. What else can I do to help prevent falls?  Wear shoes that:  Do not have high heels.  Have rubber bottoms.  Are comfortable and fit you well.  Are closed at the toe. Do not wear sandals.  If you use a stepladder:  Make sure that it is fully opened. Do not climb a closed stepladder.  Make sure that both sides of the stepladder are locked into place.  Ask someone to hold it for you, if possible.  Clearly mark and  make sure that you can see:  Any grab bars or handrails.  First and last steps.  Where the edge of each step is.  Use tools that help you move around (mobility aids) if they are needed. These include:  Canes.  Walkers.  Scooters.  Crutches.  Turn on the lights when you go into a dark area. Replace any light bulbs as soon as they burn out.  Set up your furniture so you have a clear path. Avoid moving your furniture around.  If any of your floors are uneven, fix them.  If there are any pets around you, be aware of where they are.  Review your medicines with your doctor. Some medicines can make you feel dizzy. This can increase your chance of falling. Ask your doctor what other things that you can do to help prevent falls. This information is not intended to replace advice given to you by your health care provider. Make sure you discuss any questions you have with your health care provider. Document Released: 11/19/2008 Document Revised: 07/01/2015 Document Reviewed: 02/27/2014 Elsevier Interactive Patient Education  2017 [employer].

## 2018-01-15 NOTE — Patient Instructions (Signed)
See assessment and plan under each diagnosis in the problem list and acutely for this visit 

## 2018-01-15 NOTE — Progress Notes (Signed)
NURSING HOME LOCATION:  [facility] ROOM NUMBER:  [id number]  CODE STATUS:  Full Code  PCP:  Hendricks Limes, MD  [place] [id number]  This is a [place] readmission within 30 days.  Interim medical record and care since last [place] visit was updated with review of diagnostic studies and change in clinical status since last visit were documented.  HPI: Patient was rehospitalized 12/2-12/08/2017 with acute encephalopathy in the context of CVA with residual limited mobility and schizoaffective disorder.  Patient has exhibited acute mental status changes in the past usually related to UTIs according to her daughter.  Her acute encephalopathy was manifested by vocal outbursts of nonsensical content. Urinalysis revealed pyuria but culture was negative.  Chest x-ray revealed no pneumonia. Zyprexa was added for the encephalopathy, initially 5 mg twice daily but transitioned to 5 mg nightly as per Psychiatry recommendations.  Depakote 250 mg daily was continued for mood disorder. She had a left mastectomy 10/26/2017 for metastatic cancer, ER & PR positive, HER-2 negative  She is on anastrozole.  Family expressed concern that this medication might be contributing the patient's agitation.  Oncology did not feel this was the case & recommended continuing the medication because of the patient's high risk for recurrence. Diabetes appears be well controlled with recent A1c of 6.3%.  Because of hypoglycemia in the context of minimal oral intake ,sliding scale insulin was discontinued. Leukocytosis was felt to be a stress reaction and resolved. Predischarge labs revealed mild hyperglycemia with a value of 109.  She had a stable hypochromic anemia with hemoglobin 11.4 and hematocrit 39.1.   Review of systems: Dementia invalidated responses. Date given as November 11, 2017.  She could identify the president.  She did not know why she was in the hospital but presumed it was  because of "back pain".  She denies any active health issues at this time.    Physical exam:  Pertinent or positive findings: When seen it was approximately 11 AM and the patient was still in bed sleeping.  She was arousable but not oriented as noted above.  She exhibited a slurred speech pattern almost like "baby talk".  Pupils were small and nonreactive.  She is wearing only the upper plate.  Bronchovesicular breath sounds are present.  Abdomen is protuberant.  Trace edema is present.  Pedal pulses are palpable.  Varus deformity of the knees is noted.  She was surprisingly strong to opposition in all extremities but her performance was somewhat variable.  General appearance: Adequately nourished; no acute distress, increased work of breathing is present.   Lymphatic: No lymphadenopathy about the head, neck, axilla. Eyes: No conjunctival inflammation or lid edema is present. There is no scleral icterus. Ears:  External ear exam shows no significant lesions or deformities.   Nose:  External nasal examination shows no deformity or inflammation. Nasal mucosa are pink and moist without lesions, exudates Oral exam:  Lips and gums are healthy appearing. There is no oropharyngeal erythema or exudate. Neck:  No thyromegaly, masses, tenderness noted.    Heart:  Normal rate and regular rhythm. S1 and S2 normal without gallop, murmur, click, rub .  Lungs:  without wheezes, rhonchi, rales, rubs. Abdomen: Bowel sounds are normal. Abdomen is soft and nontender with no organomegaly, hernias, masses. GU: Deferred  Extremities:  No cyanosis, clubbing  Neurologic exam : Balance, [place], finger to nose testing could not be completed due to clinical state Skin: Warm &  dry w/o tenting. No significant lesions or rash.  See summary under each active problem in the Problem List with associated updated therapeutic plan

## 2018-01-15 NOTE — Assessment & Plan Note (Signed)
Hypoglycemia while hospitalized due to poor oral intake and sliding scale insulin 10/24/2017 hemoglobin A1c 6.3% indicating excellent control

## 2018-01-16 ENCOUNTER — Ambulatory Visit
Admission: RE | Admit: 2018-01-16 | Discharge: 2018-01-16 | Disposition: A | Discharge status: Home / Self Care | Payer: Medicare Other | Source: Ambulatory Visit | Attending: Radiation Oncology | Admitting: Radiation Oncology

## 2018-01-16 DIAGNOSIS — C50212 Malignant neoplasm of upper-inner quadrant of left female breast: Secondary | ICD-10-CM | POA: Diagnosis not present

## 2018-01-17 ENCOUNTER — Encounter: Payer: Self-pay | Admitting: Internal Medicine

## 2018-01-17 ENCOUNTER — Ambulatory Visit
Admission: RE | Admit: 2018-01-17 | Discharge: 2018-01-17 | Disposition: A | Discharge status: Home / Self Care | Payer: Medicare Other | Source: Ambulatory Visit | Attending: Radiation Oncology | Admitting: Radiation Oncology

## 2018-01-17 DIAGNOSIS — C50212 Malignant neoplasm of upper-inner quadrant of left female breast: Secondary | ICD-10-CM | POA: Diagnosis not present

## 2018-01-18 ENCOUNTER — Non-Acute Institutional Stay: Payer: Medicare Other | Admitting: Primary Care

## 2018-01-18 ENCOUNTER — Ambulatory Visit
Admission: RE | Admit: 2018-01-18 | Discharge: 2018-01-18 | Disposition: A | Discharge status: Home / Self Care | Payer: Medicare Other | Source: Ambulatory Visit | Attending: Radiation Oncology | Admitting: Radiation Oncology

## 2018-01-18 DIAGNOSIS — C50212 Malignant neoplasm of upper-inner quadrant of left female breast: Secondary | ICD-10-CM | POA: Diagnosis not present

## 2018-01-18 DIAGNOSIS — Z170 Estrogen receptor positive status [ER+]: Principal | ICD-10-CM

## 2018-01-18 MED ORDER — RADIAPLEXRX EX GEL
Freq: Once | CUTANEOUS | Status: AC
  Administered 2018-01-18: 16:00:00 via TOPICAL

## 2018-01-18 MED ORDER — ALRA NON-METALLIC DEODORANT (RAD-ONC)
1.0000 "application " | Freq: Once | TOPICAL | Status: AC
  Administered 2018-01-18: 1 via TOPICAL

## 2018-01-18 NOTE — Progress Notes (Signed)
Pt here for patient teaching.  Pt given Radiation and You booklet, skin care instructions, Alra deodorant and Radiaplex gel.  Reviewed areas of pertinence such as fatigue, hair loss, skin changes, breast tenderness and breast swelling . Pt able to give teach back of to pat skin and use unscented/gentle soap,apply Radiaplex bid, avoid applying anything to skin within 4 hours of treatment, avoid wearing an under wire bra and to use an electric razor if they must shave. Pt verbalizes understanding of information given and will contact nursing with any questions or concerns.    Daughter present during education.  Gloriajean Dell. Leonie Green, BSN

## 2018-01-18 NOTE — Progress Notes (Signed)
[facility] Telephone: [phone] Fax: [phone]  PATIENT NAME: Carolyn Parrish DOB: 08/11/1940 MRN: [id number]  PRIMARY CARE PROVIDER:   Hendricks Limes, MD  REFERRING PROVIDER:  Hendricks Limes, MD [address] [place],  [address]  RESPONSIBLE PARTY:   Extended Emergency Contact Information Primary Emergency Contact: Pierra, [place] Mobile Phone: [phone] Relation: Daughter   ASSESSMENT and RECOMMENDATIONS:   1.Goals of Care: Patient states she is comfortable. Taking radiation treatments and reports no ill side effects. States good appetite and constitution. Staff report no concerns.   Will continue to follow for goals of care clarification with family.Return 1 month and prn.  I spent 15 minutes providing this consultation,  from 1645 to 1700. More than 50% of the time in this consultation was spent coordinating communication.   HISTORY OF PRESENT ILLNESS:  Carolyn Parrish is a 77 y.o. year old female with multiple medical problems including breast cancer, CAD, DM2, schizophrenia. Palliative Care was asked to help address goals of care.   CODE STATUS: FULL  PPS: 30% HOSPICE ELIGIBILITY/DIAGNOSIS: TBD  PAST MEDICAL HISTORY:  Past Medical History:  Diagnosis Date  . Cancer (HCC)    BREAST CANCER  . CHF (congestive heart failure) ([place])   . Diabetes mellitus without complication ([place])   . Hyperlipidemia   . Hypertension   . Schizoaffective disorder ([place])   . Stroke [facility])     SOCIAL HX:  Social History   Tobacco Use  . Smoking status: Never Smoker  . Smokeless tobacco: Never Used  Substance Use Topics  . Alcohol use: Not Currently    ALLERGIES:  Allergies  Allergen Reactions  . Atorvastatin     UNSPECIFIED REACTION      PERTINENT MEDICATIONS:  Outpatient Encounter Medications as of 01/18/2018  Medication Sig  . acetaminophen (TYLENOL) 325 MG tablet Take 325-650 mg by mouth every 4 (four) hours as needed (650mg  for  arthritis pain, 325mg  for fever >101).   Marland Kitchen alum & mag hydroxide-simeth (MYLANTA) 200-200-20 MG/5ML suspension Take 30 mLs by mouth every 4 (four) hours as needed for indigestion or heartburn.  . anastrozole (ARIMIDEX) 1 MG tablet Take 1 tablet (1 mg total) by mouth daily.  Marland Kitchen aspirin EC 81 MG tablet Take 81 mg by mouth daily.  . bisacodyl (DULCOLAX) 10 MG suppository Place 10 mg rectally as needed for moderate constipation.  . camphor-menthol (SARNA) lotion Apply 1 application topically 2 (two) times daily as needed for itching.  . divalproex (DEPAKOTE ER) 250 MG 24 hr tablet Take 250 mg by mouth daily.  . metFORMIN (GLUCOPHAGE) 500 MG tablet Take 500 mg by mouth daily.  Marland Kitchen NUTRITIONAL SUPPLEMENT LIQD Take 120 mLs by mouth 3 (three) times daily. MedPass  . OLANZapine (ZYPREXA) 5 MG tablet Take 1 tablet (5 mg total) by mouth at bedtime.  Marland Kitchen oxycodone (OXY-IR) 5 MG capsule Take 5 mg by mouth every 4 (four) hours as needed for pain.  . polyethylene glycol (MIRALAX / GLYCOLAX) packet Take 17 g by mouth daily.  . Sodium Phosphates (RA SALINE ENEMA) 19-7 GM/118ML ENEM Place 1 each rectally as needed (for constipation).  . vitamin B-12 (CYANOCOBALAMIN) 500 MCG tablet Take 500 mcg by mouth daily.   No facility-administered encounter medications on file as of 01/18/2018.     PHYSICAL EXAM:  VS 98.2-98-22-112/72 Ox 95% RA General: NAD, frail appearing, obese Cardiovascular: regular rate and rhythm, S1S2 Pulmonary: clear all fields, no cough Abdomen: soft, nontender but sl distended, +  bowel sounds, states no constipation GU: no suprapubic tenderness, denies dysuria Extremities: no edema, no joint deformities Skin: no rashes or wounds on gross exam Neurological: Weakness , alert oriented x 2, answers yes/no. Flat affect,  Interactive.  Cyndia Skeeters DNP, AGPCNP-BC

## 2018-01-21 ENCOUNTER — Ambulatory Visit
Admission: RE | Admit: 2018-01-21 | Discharge: 2018-01-21 | Disposition: A | Discharge status: Home / Self Care | Payer: Medicare Other | Source: Ambulatory Visit | Attending: Radiation Oncology | Admitting: Radiation Oncology

## 2018-01-21 DIAGNOSIS — C50212 Malignant neoplasm of upper-inner quadrant of left female breast: Secondary | ICD-10-CM | POA: Diagnosis not present

## 2018-01-22 ENCOUNTER — Ambulatory Visit
Admission: RE | Admit: 2018-01-22 | Discharge: 2018-01-22 | Disposition: A | Discharge status: Home / Self Care | Payer: Medicare Other | Source: Ambulatory Visit | Attending: Radiation Oncology | Admitting: Radiation Oncology

## 2018-01-22 ENCOUNTER — Encounter: Payer: Self-pay | Admitting: Internal Medicine

## 2018-01-22 DIAGNOSIS — C50212 Malignant neoplasm of upper-inner quadrant of left female breast: Secondary | ICD-10-CM | POA: Diagnosis not present

## 2018-01-22 DIAGNOSIS — R29818 Other symptoms and signs involving the nervous system: Secondary | ICD-10-CM | POA: Insufficient documentation

## 2018-01-22 DIAGNOSIS — F0150 Vascular dementia without behavioral disturbance: Secondary | ICD-10-CM | POA: Insufficient documentation

## 2018-01-22 DIAGNOSIS — R4189 Other symptoms and signs involving cognitive functions and awareness: Secondary | ICD-10-CM

## 2018-01-23 ENCOUNTER — Ambulatory Visit
Admission: RE | Admit: 2018-01-23 | Discharge: 2018-01-23 | Disposition: A | Discharge status: Home / Self Care | Payer: Medicare Other | Source: Ambulatory Visit | Attending: Radiation Oncology | Admitting: Radiation Oncology

## 2018-01-23 DIAGNOSIS — C50212 Malignant neoplasm of upper-inner quadrant of left female breast: Secondary | ICD-10-CM | POA: Diagnosis not present

## 2018-01-24 ENCOUNTER — Ambulatory Visit
Admission: RE | Admit: 2018-01-24 | Discharge: 2018-01-24 | Disposition: A | Discharge status: Home / Self Care | Payer: Medicare Other | Source: Ambulatory Visit | Attending: Radiation Oncology | Admitting: Radiation Oncology

## 2018-01-24 DIAGNOSIS — C50212 Malignant neoplasm of upper-inner quadrant of left female breast: Secondary | ICD-10-CM | POA: Diagnosis not present

## 2018-01-25 ENCOUNTER — Ambulatory Visit
Admission: RE | Admit: 2018-01-25 | Discharge: 2018-01-25 | Disposition: A | Discharge status: Home / Self Care | Payer: Medicare Other | Source: Ambulatory Visit | Attending: Radiation Oncology | Admitting: Radiation Oncology

## 2018-01-25 DIAGNOSIS — C50212 Malignant neoplasm of upper-inner quadrant of left female breast: Secondary | ICD-10-CM | POA: Diagnosis not present

## 2018-01-28 ENCOUNTER — Ambulatory Visit
Admission: RE | Admit: 2018-01-28 | Discharge: 2018-01-28 | Disposition: A | Discharge status: Home / Self Care | Payer: Medicare Other | Source: Ambulatory Visit | Attending: Radiation Oncology | Admitting: Radiation Oncology

## 2018-01-28 DIAGNOSIS — C50212 Malignant neoplasm of upper-inner quadrant of left female breast: Secondary | ICD-10-CM | POA: Diagnosis not present

## 2018-01-29 ENCOUNTER — Ambulatory Visit
Admission: RE | Admit: 2018-01-29 | Discharge: 2018-01-29 | Disposition: A | Discharge status: Home / Self Care | Payer: Medicare Other | Source: Ambulatory Visit | Attending: Radiation Oncology | Admitting: Radiation Oncology

## 2018-01-29 DIAGNOSIS — C50212 Malignant neoplasm of upper-inner quadrant of left female breast: Secondary | ICD-10-CM | POA: Diagnosis not present

## 2018-01-31 ENCOUNTER — Ambulatory Visit
Admission: RE | Admit: 2018-01-31 | Discharge: 2018-01-31 | Disposition: A | Discharge status: Home / Self Care | Payer: Medicare Other | Source: Ambulatory Visit | Attending: Radiation Oncology | Admitting: Radiation Oncology

## 2018-01-31 DIAGNOSIS — C50212 Malignant neoplasm of upper-inner quadrant of left female breast: Secondary | ICD-10-CM | POA: Diagnosis not present

## 2018-02-01 ENCOUNTER — Ambulatory Visit
Admission: RE | Admit: 2018-02-01 | Discharge: 2018-02-01 | Disposition: A | Discharge status: Home / Self Care | Payer: Medicare Other | Source: Ambulatory Visit | Attending: Radiation Oncology | Admitting: Radiation Oncology

## 2018-02-01 DIAGNOSIS — C50212 Malignant neoplasm of upper-inner quadrant of left female breast: Secondary | ICD-10-CM | POA: Diagnosis not present

## 2018-02-04 ENCOUNTER — Ambulatory Visit
Admission: RE | Admit: 2018-02-04 | Discharge: 2018-02-04 | Disposition: A | Discharge status: Home / Self Care | Payer: Medicare Other | Source: Ambulatory Visit | Attending: Radiation Oncology | Admitting: Radiation Oncology

## 2018-02-04 DIAGNOSIS — C50212 Malignant neoplasm of upper-inner quadrant of left female breast: Secondary | ICD-10-CM | POA: Diagnosis not present

## 2018-02-05 ENCOUNTER — Ambulatory Visit
Admission: RE | Admit: 2018-02-05 | Discharge: 2018-02-05 | Disposition: A | Discharge status: Home / Self Care | Payer: Medicare Other | Source: Ambulatory Visit | Attending: Radiation Oncology | Admitting: Radiation Oncology

## 2018-02-05 DIAGNOSIS — C50212 Malignant neoplasm of upper-inner quadrant of left female breast: Secondary | ICD-10-CM | POA: Diagnosis not present

## 2018-02-07 ENCOUNTER — Ambulatory Visit
Admission: RE | Admit: 2018-02-07 | Discharge: 2018-02-07 | Disposition: A | Discharge status: Home / Self Care | Payer: Medicare Other | Source: Ambulatory Visit | Attending: Radiation Oncology | Admitting: Radiation Oncology

## 2018-02-07 DIAGNOSIS — C50212 Malignant neoplasm of upper-inner quadrant of left female breast: Secondary | ICD-10-CM | POA: Diagnosis not present

## 2018-02-07 DIAGNOSIS — Z170 Estrogen receptor positive status [ER+]: Secondary | ICD-10-CM | POA: Diagnosis not present

## 2018-02-08 ENCOUNTER — Ambulatory Visit: Payer: Medicare Other | Admitting: Radiation Oncology

## 2018-02-08 ENCOUNTER — Ambulatory Visit
Admission: RE | Admit: 2018-02-08 | Discharge: 2018-02-08 | Disposition: A | Discharge status: Home / Self Care | Payer: Medicare Other | Source: Ambulatory Visit | Attending: Radiation Oncology | Admitting: Radiation Oncology

## 2018-02-08 DIAGNOSIS — C50212 Malignant neoplasm of upper-inner quadrant of left female breast: Secondary | ICD-10-CM | POA: Diagnosis not present

## 2018-02-11 ENCOUNTER — Ambulatory Visit
Admission: RE | Admit: 2018-02-11 | Discharge: 2018-02-11 | Disposition: A | Discharge status: Home / Self Care | Payer: Medicare Other | Source: Ambulatory Visit | Attending: Radiation Oncology | Admitting: Radiation Oncology

## 2018-02-11 DIAGNOSIS — C50212 Malignant neoplasm of upper-inner quadrant of left female breast: Secondary | ICD-10-CM | POA: Diagnosis not present

## 2018-02-12 ENCOUNTER — Ambulatory Visit
Admission: RE | Admit: 2018-02-12 | Discharge: 2018-02-12 | Disposition: A | Discharge status: Home / Self Care | Payer: Medicare Other | Source: Ambulatory Visit | Attending: Radiation Oncology | Admitting: Radiation Oncology

## 2018-02-12 DIAGNOSIS — C50212 Malignant neoplasm of upper-inner quadrant of left female breast: Secondary | ICD-10-CM | POA: Diagnosis not present

## 2018-02-13 ENCOUNTER — Ambulatory Visit
Admission: RE | Admit: 2018-02-13 | Discharge: 2018-02-13 | Disposition: A | Discharge status: Home / Self Care | Payer: Medicare Other | Source: Ambulatory Visit | Attending: Radiation Oncology | Admitting: Radiation Oncology

## 2018-02-13 ENCOUNTER — Non-Acute Institutional Stay: Payer: Medicare Other | Admitting: Primary Care

## 2018-02-13 DIAGNOSIS — F319 Bipolar disorder, unspecified: Secondary | ICD-10-CM

## 2018-02-13 DIAGNOSIS — C50912 Malignant neoplasm of unspecified site of left female breast: Secondary | ICD-10-CM

## 2018-02-13 DIAGNOSIS — Z515 Encounter for palliative care: Secondary | ICD-10-CM

## 2018-02-13 DIAGNOSIS — C50212 Malignant neoplasm of upper-inner quadrant of left female breast: Secondary | ICD-10-CM | POA: Diagnosis not present

## 2018-02-13 NOTE — Progress Notes (Signed)
[facility] Telephone: [phone] Fax: [phone]  PATIENT NAME: Carolyn Parrish DOB: 1940-08-04 MRN: [id number]  PRIMARY CARE PROVIDER:   Hendricks Limes, MD  REFERRING PROVIDER:  Hendricks Limes, MD [address] [place], [place] [address]  RESPONSIBLE PARTY:   Extended Emergency Contact Information Primary Emergency Contact: Angelisa, [place] Mobile Phone: [phone] Relation: Daughter  Palliative care is seeing patient upon referral from Dr. Linna Darner for goals of care, symptom management.  ASSESSMENT and RECOMMENDATIONS:   1. Goals of Care: DNR on chart, dated 01/17/2018. T/c to POA to discuss any concerns. Discussed goals of care with Anselm Lis, pt daughter and POA by telephone. Daughter requested Sarna daily (currently ordered prn) and gel for radiation wound. Continues to pursue radiation tx for breast ca.   2. Mentation: Family requests psychiatric referral. Patient mentation changed suddenly several months ago and family states it's not back to baseline.  Recommend referral to psychiatry. Patient is withdrawn today during interview.  3. Skin care: Needs prescribed ale gel  to radiation site, and general hydration lotion (Sarna)  to her skin daily. Recommend Sarna lotion to skin daily at facility. Aloe gel is ordered bid for skin in radiation region, Sarna is ordered prn for all other skin. Will clarify order to have Sarna put on daily.Facility nurse notified of upcoming order.Discussed with unit nurse who states that these lotions are being supplied but Sarna is PRN and needs to be changed to a standing order.  Palliative medicine is following patient for goals of care clarification and symptom management. Return 4 weeks or prn.  I spent 35 minutes providing this consultation,  from 1145 to 1220. More than 50% of the time in this consultation was spent coordinating communication.   HISTORY OF PRESENT ILLNESS:  Carolyn Parrish is a 78 y.o. year  old female with multiple medical problems including breast cancer, CAD, DM2, schizophrenia. Palliative Care was asked to help address goals of care.   CODE STATUS: DNR  PPS:30% HOSPICE ELIGIBILITY/DIAGNOSIS: TBD  PAST MEDICAL HISTORY:  Past Medical History:  Diagnosis Date  . Cancer (HCC)    BREAST CANCER  . CHF (congestive heart failure) ([place])   . Diabetes mellitus without complication ([place])   . Hyperlipidemia   . Hypertension   . Schizoaffective disorder ([place])   . Stroke [facility])     SOCIAL HX:  Social History   Tobacco Use  . Smoking status: Never Smoker  . Smokeless tobacco: Never Used  Substance Use Topics  . Alcohol use: Not Currently    ALLERGIES:  Allergies  Allergen Reactions  . Atorvastatin     UNSPECIFIED REACTION      PERTINENT MEDICATIONS:  Outpatient Encounter Medications as of 02/13/2018  Medication Sig  . acetaminophen (TYLENOL) 325 MG tablet Take 325-650 mg by mouth every 4 (four) hours as needed (650mg  for arthritis pain, 325mg  for fever >101).   Marland Kitchen alum & mag hydroxide-simeth (MYLANTA) 200-200-20 MG/5ML suspension Take 30 mLs by mouth every 4 (four) hours as needed for indigestion or heartburn.  . anastrozole (ARIMIDEX) 1 MG tablet Take 1 tablet (1 mg total) by mouth daily.  Marland Kitchen aspirin EC 81 MG tablet Take 81 mg by mouth daily.  . bisacodyl (DULCOLAX) 10 MG suppository Place 10 mg rectally as needed for moderate constipation.  . camphor-menthol (SARNA) lotion Apply 1 application topically 2 (two) times daily as needed for itching.  . divalproex (DEPAKOTE ER) 250 MG 24 hr tablet Take 250 mg by  mouth daily.  . metFORMIN (GLUCOPHAGE) 500 MG tablet Take 500 mg by mouth daily.  Marland Kitchen NUTRITIONAL SUPPLEMENT LIQD Take 120 mLs by mouth 3 (three) times daily. MedPass  . OLANZapine (ZYPREXA) 5 MG tablet Take 1 tablet (5 mg total) by mouth at bedtime.  Marland Kitchen oxycodone (OXY-IR) 5 MG capsule Take 5 mg by mouth every 4 (four) hours as needed for pain.  . polyethylene glycol  (MIRALAX / GLYCOLAX) packet Take 17 g by mouth daily.  . Sodium Phosphates (RA SALINE ENEMA) 19-7 GM/118ML ENEM Place 1 each rectally as needed (for constipation).  . vitamin B-12 (CYANOCOBALAMIN) 500 MCG tablet Take 500 mcg by mouth daily.   No facility-administered encounter medications on file as of 02/13/2018.     PHYSICAL EXAM:   VS 98.0-80-18 134/90 arm circ 40 cm, wt 214 lbs  General: NAD, frail appearing, obese Cardiovascular: regular rate and rhythm, S1 S2 Pulmonary: clear all fields, no cough Abdomen: soft, nontender, + bowel sounds, appetite good Extremities: no edema, no joint deformities Skin: no rashes, dry skin, gel to radiation area to decrease RID. Neurological: Weakness , flat affect, memory loss, dysarthria  Cyndia Skeeters DNP, AGPCNP-BC

## 2018-02-14 ENCOUNTER — Ambulatory Visit
Admission: RE | Admit: 2018-02-14 | Discharge: 2018-02-14 | Disposition: A | Discharge status: Home / Self Care | Payer: Medicare Other | Source: Ambulatory Visit | Attending: Radiation Oncology | Admitting: Radiation Oncology

## 2018-02-14 DIAGNOSIS — C50212 Malignant neoplasm of upper-inner quadrant of left female breast: Secondary | ICD-10-CM | POA: Diagnosis not present

## 2018-02-15 ENCOUNTER — Ambulatory Visit: Payer: Medicare Other | Admitting: Radiation Oncology

## 2018-02-15 ENCOUNTER — Ambulatory Visit
Admission: RE | Admit: 2018-02-15 | Discharge: 2018-02-15 | Disposition: A | Discharge status: Home / Self Care | Payer: Medicare Other | Source: Ambulatory Visit | Attending: Radiation Oncology | Admitting: Radiation Oncology

## 2018-02-15 DIAGNOSIS — C50212 Malignant neoplasm of upper-inner quadrant of left female breast: Secondary | ICD-10-CM | POA: Diagnosis not present

## 2018-02-18 ENCOUNTER — Ambulatory Visit
Admission: RE | Admit: 2018-02-18 | Discharge: 2018-02-18 | Disposition: A | Discharge status: Home / Self Care | Payer: Medicare Other | Source: Ambulatory Visit | Attending: Radiation Oncology | Admitting: Radiation Oncology

## 2018-02-18 DIAGNOSIS — C50212 Malignant neoplasm of upper-inner quadrant of left female breast: Secondary | ICD-10-CM

## 2018-02-18 DIAGNOSIS — Z170 Estrogen receptor positive status [ER+]: Principal | ICD-10-CM

## 2018-02-18 MED ORDER — RADIAPLEXRX EX GEL
Freq: Once | CUTANEOUS | Status: AC
  Administered 2018-02-18: 15:00:00 via TOPICAL

## 2018-02-18 NOTE — Progress Notes (Signed)
[place]  Telephone:([phone] [phone]     ID: Carolyn Parrish DOB: 09-09-1940  MR#: [id number]  CSN#:[id number]  Patient Care Team: Carolyn Limes, MD as PCP - General (Internal Medicine) Carolyn Luna, MD as Consulting Physician (General Surgery) Magrinat, Virgie Dad, MD as Consulting Physician (Oncology) Carolyn Rudd, MD as Consulting Physician (Radiation Oncology) Carolyn Oaks, NP as Nurse Practitioner (Hospice and Palliative Medicine) OTHER MD:   CHIEF COMPLAINT: Estrogen receptor positive breast cancer  CURRENT TREATMENT: anastrozole; adjuvant radiation   HISTORY OF CURRENT ILLNESS: From Carolyn original intake note:  Carolyn Parrish was found to have a palpable mass in Carolyn left breast. She underwent bilateral diagnostic mammography with tomography and left breast ultrasonography at [facility] on 08/31/2017 showing: breast density category C. There was a suspicious mass in Carolyn left breast at Carolyn 9 o'clock retroareolar location involving Carolyn medial aspect of Carolyn nipple, measuring 2.8 x 1.6 x 1.0 cm. Sonographic evaluation of Carolyn left axilla demonstrates no suspicious lymphadenopathy. Limited mammographic evaluation of Carolyn right breast is unremarkable.  Accordingly on 09/05/2017 she proceeded to biopsy of Carolyn left breast area in question. Carolyn pathology from this procedure showed ([id number]): Invasive ductal carcinoma grade 2. Perineural invasion is identified. Prognostic indicators significant for: estrogen receptor, 100% positive and progesterone receptor, 80% positive, both with strong staining intensity. Proliferation marker Ki67 at 15%. HER2 not amplified with ratios HER2/CEP17 signals 1.70 and average HER2 copies per cell 4.75.   Carolyn patient's subsequent history is as detailed below.   INTERVAL HISTORY: Carolyn Parrish returns today for follow-up and treatment of her estrogen receptor positive breast cancer. She is accompanied by her  Carolyn Parrish.  Carolyn patient continues on anastrozole. She does not have any hot flashes.  Carolyn Parrish does not have a bone density screening on file.  She also continues with adjuvant radiation. Her skin was getting tight and her Carolyn Parrish. She was having difficult lifting her arm due to Carolyn skin tightness.   Since her last visit here, she had an episode of encephalopathy or confusion.  She underwent a head CT without contrast on 01/08/2018 showing no acute intracranial hemorrhage. Mild age-related atrophy and chronic microvascular ischemic changes.  She also underwent an abdomen CT and pelvis with contrast on 01/08/2018 showing no acute intra-abdominal or pelvic pathology. Mild gaseous distention of Carolyn colon. No bowel obstruction or active inflammation. Normal appendix. Cholecystectomy with CBD stent in place. No intrahepatic biliary ductal dilatation. Fatty liver.  Finally, she underwent a repeat head CT without contrast on 01/10/2018 showing no acute intracranial abnormality identified. Stable mild chronic microvascular ischemic changes of Carolyn brain and supratentorial volume loss. Asymmetric cerebellar volume loss. This may be due to alcoholic cerebellar degeneration, phenytoin/lithium toxicity, sequelae of cerebellitis, associated with an inherited ataxia syndrome, or represent a neurodegenerate disorder such as multi-system atrophy.   REVIEW OF SYSTEMS: Carolyn Parrish states that Carolyn Parrish recently went through a "spell" that delayed radiation; she was slow to respond, fatigued, and her personality became dull. She is unsure if her "spell" is due to her phychiatric diagnoses or if it is Carolyn result of her treatments. She has asked for a phychiatric doctor to give her an update about Carolyn Parrish. Carolyn Parrish states that she is constipated. Carolyn patient denies unusual headaches, visual changes, nausea, vomiting, or dizziness. There has been no unusual cough, phlegm production, or pleurisy. This been no change in  bladder habits. Carolyn patient denies unexplained fatigue or unexplained weight loss, bleeding, rash, or fever.  A detailed review of systems was otherwise noncontributory.    PAST MEDICAL HISTORY: Past Medical History:  Diagnosis Date  . Cancer (HCC)    BREAST CANCER  . CHF (congestive heart failure) ([facility])   . Diabetes mellitus without complication ([place])   . Hyperlipidemia   . Hypertension   . Schizoaffective disorder ([place])   . Stroke [facility])   Polyosteoarthrtis and generalized muscle weakness.  Diabetes- takes metformin  PAST SURGICAL HISTORY: Past Surgical History:  Procedure Laterality Date  . CHOLECYSTECTOMY     2016  . MASTECTOMY COMPLETE / SIMPLE Left 10/30/2017  . SIMPLE MASTECTOMY WITH AXILLARY SENTINEL NODE BIOPSY Left 10/30/2017   Procedure: LEFT SIMPLE MASTECTOMY;  Surgeon: Carolyn Luna, MD;  Location: [facility];  Service: General;  Laterality: Left;    Cholecystectomy 2016  FAMILY HISTORY No family history on file. Carolyn patient's father died of old age at 69. Carolyn patient's mother also died of old age at 36. Carolyn patient has 5 brothers and 6 sisters. Carolyn patient denies a family history of breast or ovarian cancer.   GYNECOLOGIC HISTORY:  No LMP recorded. Patient is postmenopausal. Age at first live birth: 78 years old She is Carolyn Parrish (Carolyn last was still born).  Her LMP was age [age]. She never took HRT or oral contraception.    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 Carolyn patient is widowed. Carolyn patient's Carolyn Parrish, Carolyn Parrish lives in [place], [place] and works as a Freight forwarder. Carolyn patient's son, Carolyn Parrish lives in [place], [place] and works as a Administrator. Carolyn patient's Carolyn Parrish, Carolyn Parrish lives in [place] and works as a Customer service manager. Carolyn patient as 3 grandchildren. She is a Psychologist, forensic.      ADVANCED DIRECTIVES: Carolyn patient's Carolyn Parrish, Carolyn Parrish is her HCPOA, and she can be reached at [phone]   HEALTH  MAINTENANCE: Social History   Tobacco Use  . Smoking status: Never Smoker  . Smokeless tobacco: Never Used  Substance Use Topics  . Alcohol use: Not Currently  . Drug use: Never     Colonoscopy:   PAP:   Bone density:    Allergies  Allergen Reactions  . Atorvastatin     UNSPECIFIED REACTION     Current Outpatient Medications  Medication Sig Dispense Refill  . acetaminophen (TYLENOL) 325 MG tablet Take 325-650 mg by mouth every 4 (four) hours as needed ('650mg'$  for arthritis pain, '325mg'$  for fever >101).     Marland Kitchen alum & mag hydroxide-simeth (MYLANTA) 200-200-20 MG/5ML suspension Take 30 mLs by mouth every 4 (four) hours as needed for indigestion or heartburn.    . anastrozole (ARIMIDEX) 1 MG tablet Take 1 tablet (1 mg total) by mouth daily. 90 tablet 4  . aspirin EC 81 MG tablet Take 81 mg by mouth daily.    . bisacodyl (DULCOLAX) 10 MG suppository Place 10 mg rectally as needed for moderate constipation.    . camphor-menthol (SARNA) lotion Apply 1 application topically 2 (two) times daily as needed for itching.    . divalproex (DEPAKOTE ER) 250 MG 24 hr tablet Take 250 mg by mouth daily.    . metFORMIN (GLUCOPHAGE) 500 MG tablet Take 500 mg by mouth daily.    Marland Kitchen NUTRITIONAL SUPPLEMENT LIQD Take 120 mLs by mouth 3 (three) times daily. MedPass    . OLANZapine (ZYPREXA) 5 MG tablet Take 1 tablet (5 mg total) by mouth at bedtime. 30 tablet 0  . oxycodone (OXY-IR)  5 MG capsule Take 5 mg by mouth every 4 (four) hours as needed for pain.    . polyethylene glycol (MIRALAX / GLYCOLAX) packet Take 17 g by mouth daily.    . Sodium Phosphates (RA SALINE ENEMA) 19-7 GM/118ML ENEM Place 1 each rectally as needed (for constipation).    . vitamin B-12 (CYANOCOBALAMIN) 500 MCG tablet Take 500 mcg by mouth daily.     No current facility-administered medications for this visit.     OBJECTIVE: Older African-American woman examined in a wheelchair Vitals:   02/19/18 1309  BP: 123/74  Pulse: 76   Resp: 18  Temp: 97.9 F (36.6 C)  SpO2: 100%     Body mass index is 29.84 kg/m.   Wt Readings from Last 3 Encounters:  02/19/18 208 lb (94.3 kg)  01/15/18 215 lb (97.5 kg)  01/15/18 215 lb 2.7 oz (97.6 kg)  ECOG FS:2 - Symptomatic, <50% confined to bed  Sclerae unicteric, EOMs intact Oropharynx shows thrush No cervical or supraclavicular adenopathy Lungs no rales or rhonchi Heart regular rate and rhythm Abd soft, obese, nontender, positive bowel sounds MSK no focal spinal tenderness, no upper extremity lymphedema Neuro: Status post stroke, pleasant affect Breasts: Status post left mastectomy.  Carolyn incision is healed very nicely.  There is minimal hyperpigmentation, no erythema, no desquamation.  Both axillae are benign    LAB RESULTS:  CMP     Component Value Date/Time   NA 142 02/19/2018 1221   NA 143 12/10/1940   K 4.2 02/19/2018 1221   CL 104 02/19/2018 1221   CO2 29 02/19/2018 1221   GLUCOSE 111 (H) 02/19/2018 1221   BUN 12 02/19/2018 1221   BUN 11 Jun 20, 1940   CREATININE 0.77 02/19/2018 1221   CREATININE 0.90 09/12/2017 1132   CALCIUM 9.8 02/19/2018 1221   PROT 7.5 02/19/2018 1221   ALBUMIN 3.4 (L) 02/19/2018 1221   AST 15 02/19/2018 1221   AST 17 09/12/2017 1132   ALT 20 02/19/2018 1221   ALT 26 09/12/2017 1132   ALKPHOS 81 02/19/2018 1221   BILITOT 0.3 02/19/2018 1221   BILITOT 0.5 09/12/2017 1132   GFRNONAA >60 02/19/2018 1221   GFRNONAA >60 09/12/2017 1132   GFRAA >60 02/19/2018 1221   GFRAA >60 09/12/2017 1132    No results found for: TOTALPROTELP, ALBUMINELP, A1GS, A2GS, BETS, BETA2SER, GAMS, MSPIKE, SPEI  No results found for: KPAFRELGTCHN, LAMBDASER, KAPLAMBRATIO  Lab Results  Component Value Date   WBC 6.7 02/19/2018   NEUTROABS 4.4 02/19/2018   HGB 13.8 02/19/2018   HCT 44.9 02/19/2018   MCV 96.4 02/19/2018   PLT 233 02/19/2018    '@LASTCHEMISTRY'$ @  No results found for: LABCA2  No components found for: [id number]  No results  for input(s): INR in Carolyn last 168 hours.  No results found for: LABCA2  No results found for: JKK938  No results found for: HWE993  No results found for: ZJI967  No results found for: CA2729  No components found for: HGQUANT  No results found for: CEA1 / No results found for: CEA1   No results found for: AFPTUMOR  No results found for: CHROMOGRNA  No results found for: PSA1  Appointment on 02/19/2018  Component Date Value Ref Range Status  . Sodium 02/19/2018 142  135 - 145 mmol/L Final  . Potassium 02/19/2018 4.2  3.5 - 5.1 mmol/L Final  . Chloride 02/19/2018 104  98 - 111 mmol/L Final  . CO2 02/19/2018 29  22 - 32  mmol/L Final  . Glucose, Bld 02/19/2018 111* 70 - 99 mg/dL Final  . BUN 02/19/2018 12  8 - 23 mg/dL Final  . Creatinine, Ser 02/19/2018 0.77  0.44 - 1.00 mg/dL Final  . Calcium 02/19/2018 9.8  8.9 - 10.3 mg/dL Final  . Total Protein 02/19/2018 7.5  6.5 - 8.1 g/dL Final  . Albumin 02/19/2018 3.4* 3.5 - 5.0 g/dL Final  . AST 02/19/2018 15  15 - 41 U/L Final  . ALT 02/19/2018 20  0 - 44 U/L Final  . Alkaline Phosphatase 02/19/2018 81  38 - 126 U/L Final  . Total Bilirubin 02/19/2018 0.3  0.3 - 1.2 mg/dL Final  . GFR calc non Af Amer 02/19/2018 >60  >60 mL/min Final  . GFR calc Af Amer 02/19/2018 >60  >60 mL/min Final  . Anion gap 02/19/2018 9  5 - 15 Final   Performed at [facility], [place] [address] [place], [place] [address]  . WBC 02/19/2018 6.7  4.0 - 10.5 K/uL Final  . RBC 02/19/2018 4.66  3.87 - 5.11 MIL/uL Final  . Hemoglobin 02/19/2018 13.8  12.0 - 15.0 g/dL Final  . HCT 02/19/2018 44.9  36.0 - 46.0 % Final  . MCV 02/19/2018 96.4  80.0 - 100.0 fL Final  . MCH 02/19/2018 29.6  26.0 - 34.0 pg Final  . MCHC 02/19/2018 30.7  30.0 - 36.0 g/dL Final  . RDW 02/19/2018 15.5  11.5 - 15.5 % Final  . Platelets 02/19/2018 233  150 - 400 K/uL Final  . nRBC 02/19/2018 0.0  0.0 - 0.2 % Final  . Neutrophils Relative % 02/19/2018 64  %  Final  . Neutro Abs 02/19/2018 4.4  1.7 - 7.7 K/uL Final  . Lymphocytes Relative 02/19/2018 20  % Final  . Lymphs Abs 02/19/2018 1.3  0.7 - 4.0 K/uL Final  . Monocytes Relative 02/19/2018 12  % Final  . Monocytes Absolute 02/19/2018 0.8  0.1 - 1.0 K/uL Final  . Eosinophils Relative 02/19/2018 2  % Final  . Eosinophils Absolute 02/19/2018 0.2  0.0 - 0.5 K/uL Final  . Basophils Relative 02/19/2018 1  % Final  . Basophils Absolute 02/19/2018 0.0  0.0 - 0.1 K/uL Final  . Immature Granulocytes 02/19/2018 1  % Final  . Abs Immature Granulocytes 02/19/2018 0.03  0.00 - 0.07 K/uL Final   Performed at [facility], [address] [place], [place] [address]    (this displays Carolyn last labs from Carolyn last 3 days)  No results found for: TOTALPROTELP, ALBUMINELP, A1GS, A2GS, BETS, BETA2SER, GAMS, MSPIKE, SPEI (this displays SPEP labs)  No results found for: KPAFRELGTCHN, LAMBDASER, KAPLAMBRATIO (kappa/lambda light chains)  No results found for: HGBA, HGBA2QUANT, HGBFQUANT, HGBSQUAN (Hemoglobinopathy evaluation)   No results found for: LDH  No results found for: IRON, TIBC, IRONPCTSAT (Iron and TIBC)  No results found for: FERRITIN  Urinalysis    Component Value Date/Time   COLORURINE YELLOW 01/08/2018 0012   APPEARANCEUR CLEAR 01/08/2018 0012   LABSPEC 1.020 01/08/2018 0012   PHURINE 5.0 01/08/2018 0012   GLUCOSEU NEGATIVE 01/08/2018 0012   HGBUR NEGATIVE 01/08/2018 0012   BILIRUBINUR NEGATIVE 01/08/2018 0012   KETONESUR 20 (A) 01/08/2018 0012   PROTEINUR NEGATIVE 01/08/2018 0012   NITRITE NEGATIVE 01/08/2018 0012   LEUKOCYTESUR SMALL (A) 01/08/2018 0012     STUDIES: No results found.  ELIGIBLE FOR AVAILABLE RESEARCH PROTOCOL: no  ASSESSMENT: 78 y.o. [place] skilled nursing facility resident status  post left breast upper outer quadrant biopsy 09/05/2017 for a clinical T2N0, stage Ib invasive ductal carcinoma, grade 2, estrogen and progesterone  receptor positive, HER-2 not amplified, with an MIB-1 of 15%.  (1) status post left mastectomy 10/30/2017, for a pT4 pN1, stage IIIB invasive ductal carcinoma, grade 2, with lymphovascular invasion and involvement of Carolyn nipple and epidermis of Carolyn areola, but with negative margins  (a) 1 of 2 non-sentinel lymph nodes removed positive straight capsular extension  (2) Anastrozole started on 12/12/2017  (3) Adjuvant radiation 01/08/2018 to be completed 03/01/2018   PLAN:  Nalaysia appears to be tolerating her anastrozole well and Carolyn plan will be to continue that a total of 5 years.  So far she is doing well with radiation.  Carolyn next 2 weeks of course will be a little bit more difficult.  I have reassured Carolyn patient and her Carolyn Parrish that once Carolyn radiation is over Carolyn skin will heal very quickly.  In Carolyn meantime they will continue to use Carolyn radio Plex as instructed  Carolyn lowest does have some thrush and I am writing for her to receive Diflucan at Carolyn nursing home for Carolyn next 5 days  We are going to see her again in about 3 months.  She receives her next mammography in July of this year, and at that time she will have a bone density which will serve as baseline  They know to call for any other issues that may develop before Carolyn next visit here.       Magrinat, Virgie Dad, MD  02/19/18 1:29 PM Medical Oncology and Hematology [facility] [address] [place], [place] [address] Tel. [phone]    Fax. [phone]  I, Jacqualyn Posey am acting as a Education administrator for Chauncey Cruel, MD.   I, Lurline Del MD, have reviewed Carolyn above documentation for accuracy and completeness, and I agree with Carolyn above.

## 2018-02-19 ENCOUNTER — Telehealth: Payer: Self-pay | Admitting: Oncology

## 2018-02-19 ENCOUNTER — Inpatient Hospital Stay: Payer: Medicare Other | Attending: Oncology

## 2018-02-19 ENCOUNTER — Ambulatory Visit
Admission: RE | Admit: 2018-02-19 | Discharge: 2018-02-19 | Disposition: A | Discharge status: Home / Self Care | Payer: Medicare Other | Source: Ambulatory Visit | Attending: Radiation Oncology | Admitting: Radiation Oncology

## 2018-02-19 ENCOUNTER — Inpatient Hospital Stay (HOSPITAL_BASED_OUTPATIENT_CLINIC_OR_DEPARTMENT_OTHER): Payer: Medicare Other | Admitting: Oncology

## 2018-02-19 VITALS — BP 123/74 | HR 76 | Temp 97.9°F | Resp 18 | Ht 70.0 in | Wt 208.0 lb

## 2018-02-19 DIAGNOSIS — I509 Heart failure, unspecified: Secondary | ICD-10-CM | POA: Insufficient documentation

## 2018-02-19 DIAGNOSIS — C50412 Malignant neoplasm of upper-outer quadrant of left female breast: Secondary | ICD-10-CM | POA: Insufficient documentation

## 2018-02-19 DIAGNOSIS — Z79811 Long term (current) use of aromatase inhibitors: Secondary | ICD-10-CM | POA: Diagnosis not present

## 2018-02-19 DIAGNOSIS — Z9012 Acquired absence of left breast and nipple: Secondary | ICD-10-CM | POA: Diagnosis not present

## 2018-02-19 DIAGNOSIS — I110 Hypertensive heart disease with heart failure: Secondary | ICD-10-CM | POA: Insufficient documentation

## 2018-02-19 DIAGNOSIS — C50212 Malignant neoplasm of upper-inner quadrant of left female breast: Secondary | ICD-10-CM

## 2018-02-19 DIAGNOSIS — Z170 Estrogen receptor positive status [ER+]: Secondary | ICD-10-CM | POA: Diagnosis not present

## 2018-02-19 DIAGNOSIS — E119 Type 2 diabetes mellitus without complications: Secondary | ICD-10-CM

## 2018-02-19 DIAGNOSIS — Z7189 Other specified counseling: Secondary | ICD-10-CM

## 2018-02-19 DIAGNOSIS — I6359 Cerebral infarction due to unspecified occlusion or stenosis of other cerebral artery: Secondary | ICD-10-CM

## 2018-02-19 DIAGNOSIS — F258 Other schizoaffective disorders: Secondary | ICD-10-CM

## 2018-02-19 LAB — CBC WITH DIFFERENTIAL/PLATELET
Abs Immature Granulocytes: 0.03 10*3/uL (ref 0.00–0.07)
Basophils Absolute: 0 10*3/uL (ref 0.0–0.1)
Basophils Relative: 1 %
Eosinophils Absolute: 0.2 10*3/uL (ref 0.0–0.5)
Eosinophils Relative: 2 %
HCT: 44.9 % (ref 36.0–46.0)
HEMOGLOBIN: 13.8 g/dL (ref 12.0–15.0)
Immature Granulocytes: 1 %
LYMPHS PCT: 20 %
Lymphs Abs: 1.3 10*3/uL (ref 0.7–4.0)
MCH: 29.6 pg (ref 26.0–34.0)
MCHC: 30.7 g/dL (ref 30.0–36.0)
MCV: 96.4 fL (ref 80.0–100.0)
Monocytes Absolute: 0.8 10*3/uL (ref 0.1–1.0)
Monocytes Relative: 12 %
NEUTROS PCT: 64 %
Neutro Abs: 4.4 10*3/uL (ref 1.7–7.7)
Platelets: 233 10*3/uL (ref 150–400)
RBC: 4.66 MIL/uL (ref 3.87–5.11)
RDW: 15.5 % (ref 11.5–15.5)
WBC: 6.7 10*3/uL (ref 4.0–10.5)
nRBC: 0 % (ref 0.0–0.2)

## 2018-02-19 LAB — COMPREHENSIVE METABOLIC PANEL
ALT: 20 U/L (ref 0–44)
AST: 15 U/L (ref 15–41)
Albumin: 3.4 g/dL — ABNORMAL LOW (ref 3.5–5.0)
Alkaline Phosphatase: 81 U/L (ref 38–126)
Anion gap: 9 (ref 5–15)
BUN: 12 mg/dL (ref 8–23)
CHLORIDE: 104 mmol/L (ref 98–111)
CO2: 29 mmol/L (ref 22–32)
Calcium: 9.8 mg/dL (ref 8.9–10.3)
Creatinine, Ser: 0.77 mg/dL (ref 0.44–1.00)
GFR calc Af Amer: >60 mL/min (ref >60)
GFR calc non Af Amer: >60 mL/min (ref >60)
Glucose, Bld: 111 mg/dL — ABNORMAL HIGH (ref 70–99)
Potassium: 4.2 mmol/L (ref 3.5–5.1)
Sodium: 142 mmol/L (ref 135–145)
Total Bilirubin: 0.3 mg/dL (ref 0.3–1.2)
Total Protein: 7.5 g/dL (ref 6.5–8.1)

## 2018-02-19 NOTE — Telephone Encounter (Signed)
Gave avs and calendar ° °

## 2018-02-20 ENCOUNTER — Ambulatory Visit
Admission: RE | Admit: 2018-02-20 | Discharge: 2018-02-20 | Disposition: A | Discharge status: Home / Self Care | Payer: Medicare Other | Source: Ambulatory Visit | Attending: Radiation Oncology | Admitting: Radiation Oncology

## 2018-02-20 ENCOUNTER — Ambulatory Visit: Payer: Medicare Other

## 2018-02-20 DIAGNOSIS — C50212 Malignant neoplasm of upper-inner quadrant of left female breast: Secondary | ICD-10-CM | POA: Diagnosis not present

## 2018-02-21 ENCOUNTER — Ambulatory Visit
Admission: RE | Admit: 2018-02-21 | Discharge: 2018-02-21 | Disposition: A | Discharge status: Home / Self Care | Payer: Medicare Other | Source: Ambulatory Visit | Attending: Radiation Oncology | Admitting: Radiation Oncology

## 2018-02-21 ENCOUNTER — Ambulatory Visit: Payer: Medicare Other

## 2018-02-21 DIAGNOSIS — C50212 Malignant neoplasm of upper-inner quadrant of left female breast: Secondary | ICD-10-CM | POA: Diagnosis not present

## 2018-02-22 ENCOUNTER — Ambulatory Visit: Payer: Medicare Other

## 2018-02-22 ENCOUNTER — Ambulatory Visit
Admission: RE | Admit: 2018-02-22 | Discharge: 2018-02-22 | Disposition: A | Discharge status: Home / Self Care | Payer: Medicare Other | Source: Ambulatory Visit | Attending: Radiation Oncology | Admitting: Radiation Oncology

## 2018-02-22 DIAGNOSIS — C50212 Malignant neoplasm of upper-inner quadrant of left female breast: Secondary | ICD-10-CM | POA: Diagnosis not present

## 2018-02-25 ENCOUNTER — Ambulatory Visit
Admission: RE | Admit: 2018-02-25 | Discharge: 2018-02-25 | Disposition: A | Discharge status: Home / Self Care | Payer: Medicare Other | Source: Ambulatory Visit | Attending: Radiation Oncology | Admitting: Radiation Oncology

## 2018-02-25 ENCOUNTER — Ambulatory Visit: Payer: Medicare Other

## 2018-02-25 DIAGNOSIS — C50212 Malignant neoplasm of upper-inner quadrant of left female breast: Secondary | ICD-10-CM | POA: Diagnosis not present

## 2018-02-26 ENCOUNTER — Encounter: Payer: Self-pay | Admitting: *Deleted

## 2018-02-26 ENCOUNTER — Ambulatory Visit: Payer: Medicare Other

## 2018-02-26 ENCOUNTER — Ambulatory Visit
Admission: RE | Admit: 2018-02-26 | Discharge: 2018-02-26 | Disposition: A | Discharge status: Home / Self Care | Payer: Medicare Other | Source: Ambulatory Visit | Attending: Radiation Oncology | Admitting: Radiation Oncology

## 2018-02-26 DIAGNOSIS — C50212 Malignant neoplasm of upper-inner quadrant of left female breast: Secondary | ICD-10-CM | POA: Diagnosis not present

## 2018-02-27 ENCOUNTER — Ambulatory Visit: Payer: Medicare Other

## 2018-02-27 ENCOUNTER — Other Ambulatory Visit: Payer: Medicare Other

## 2018-02-27 ENCOUNTER — Ambulatory Visit
Admission: RE | Admit: 2018-02-27 | Discharge: 2018-02-27 | Disposition: A | Discharge status: Home / Self Care | Payer: Medicare Other | Source: Ambulatory Visit | Attending: Radiation Oncology | Admitting: Radiation Oncology

## 2018-02-27 ENCOUNTER — Ambulatory Visit: Payer: Medicare Other | Admitting: Oncology

## 2018-02-27 DIAGNOSIS — C50212 Malignant neoplasm of upper-inner quadrant of left female breast: Secondary | ICD-10-CM | POA: Diagnosis not present

## 2018-02-28 ENCOUNTER — Ambulatory Visit: Payer: Medicare Other

## 2018-02-28 ENCOUNTER — Ambulatory Visit
Admission: RE | Admit: 2018-02-28 | Discharge: 2018-02-28 | Disposition: A | Discharge status: Home / Self Care | Payer: Medicare Other | Source: Ambulatory Visit | Attending: Radiation Oncology | Admitting: Radiation Oncology

## 2018-02-28 DIAGNOSIS — C50212 Malignant neoplasm of upper-inner quadrant of left female breast: Secondary | ICD-10-CM | POA: Diagnosis not present

## 2018-03-01 ENCOUNTER — Encounter: Payer: Self-pay | Admitting: Radiation Oncology

## 2018-03-01 ENCOUNTER — Ambulatory Visit
Admission: RE | Admit: 2018-03-01 | Discharge: 2018-03-01 | Disposition: A | Discharge status: Home / Self Care | Payer: Medicare Other | Source: Ambulatory Visit | Attending: Radiation Oncology | Admitting: Radiation Oncology

## 2018-03-01 DIAGNOSIS — C50212 Malignant neoplasm of upper-inner quadrant of left female breast: Secondary | ICD-10-CM | POA: Diagnosis not present

## 2018-03-20 ENCOUNTER — Non-Acute Institutional Stay: Payer: Medicare Other | Admitting: Primary Care

## 2018-03-20 DIAGNOSIS — Z515 Encounter for palliative care: Secondary | ICD-10-CM

## 2018-03-20 DIAGNOSIS — F319 Bipolar disorder, unspecified: Secondary | ICD-10-CM

## 2018-03-20 DIAGNOSIS — C50912 Malignant neoplasm of unspecified site of left female breast: Secondary | ICD-10-CM

## 2018-03-20 NOTE — Progress Notes (Signed)
[facility] Consult Note Telephone: [phone]  Fax: [phone]  PATIENT NAME: Carolyn Parrish DOB: 07-Jun-1940 MRN: [id number]  PRIMARY CARE PROVIDER:   Hendricks Limes, MD  REFERRING PROVIDER:  Hendricks Limes, MD [address] [place], [place] [address]  RESPONSIBLE PARTY:   Extended Emergency Contact Information Primary Emergency Contact: Correna, Meacham Mobile Phone: [phone] Relation: Daughter  Palliative care is seeing patient upon referral from Dr. Linna Darner for goals of care, symptom management.  ASSESSMENT and RECOMMENDATIONS:   1. Goals of care: DNR on chart. Finished treatment for breast cancer. Family not present to discuss goals of care and pt is poor historian.   2. Skin care; Radiation skin damage resolving and decreasing in pain. Taking aloe gel, states this feels good although much burning is resolving with ending of rad tx several weeks ago.   3. Self care:  Encouraged to try some activities, states she likes bingo. Little potential for self care, stays in bed and can feed self but needs assistance. States she sits up little in a chair.  4. Constipation: Schedule 1 senna bid, increase until daily soft bms. Endorses hard stool, recommend scheduled bowel program.   Palliative medicine is following patient for goals of care clarification and symptom management. Appears stable at this time following surgery for breast ca as well as radiation. Return 4 weeks or prn.   I spent 15 minutes providing this consultation,  from 1500 to 1515. More than 50% of the time in this consultation was spent coordinating communication.   HISTORY OF PRESENT ILLNESS:  Carolyn Parrish is a 78 y.o. year old female with multiple medical problems including breast cancer, CAD, DM2, schizophrenia. Palliative Care was asked to help address goals of care.   CODE STATUS: DNR PPS: 30% HOSPICE ELIGIBILITY/DIAGNOSIS: TBD  PAST MEDICAL  HISTORY:  Past Medical History:  Diagnosis Date  . Cancer (HCC)    BREAST CANCER  . CHF (congestive heart failure) ([place])   . Diabetes mellitus without complication ([place])   . Hyperlipidemia   . Hypertension   . Schizoaffective disorder ([place])   . Stroke [facility])     SOCIAL HX:  Social History   Tobacco Use  . Smoking status: Never Smoker  . Smokeless tobacco: Never Used  Substance Use Topics  . Alcohol use: Not Currently    ALLERGIES:  Allergies  Allergen Reactions  . Atorvastatin     UNSPECIFIED REACTION      PERTINENT MEDICATIONS:  Outpatient Encounter Medications as of 03/20/2018  Medication Sig  . acetaminophen (TYLENOL) 325 MG tablet Take 325-650 mg by mouth every 4 (four) hours as needed (650mg  for arthritis pain, 325mg  for fever >101).   Marland Kitchen alum & mag hydroxide-simeth (MYLANTA) 200-200-20 MG/5ML suspension Take 30 mLs by mouth every 4 (four) hours as needed for indigestion or heartburn.  . anastrozole (ARIMIDEX) 1 MG tablet Take 1 tablet (1 mg total) by mouth daily.  Marland Kitchen aspirin EC 81 MG tablet Take 81 mg by mouth daily.  . bisacodyl (DULCOLAX) 10 MG suppository Place 10 mg rectally as needed for moderate constipation.  . camphor-menthol (SARNA) lotion Apply 1 application topically 2 (two) times daily as needed for itching.  . Cholecalciferol (VITAMIN D) 50 MCG (2000 UT) tablet Take 2,000 Units by mouth daily.  . divalproex (DEPAKOTE ER) 250 MG 24 hr tablet Take 250 mg by mouth daily.  . hyaluronate sodium (RADIAPLEXRX) GEL Apply 1 application topically 2 (two) times  daily. Apply twice daily after radiation and at bedtime M-F  . metFORMIN (GLUCOPHAGE) 500 MG tablet Take 500 mg by mouth daily.  Marland Kitchen NUTRITIONAL SUPPLEMENT LIQD Take 120 mLs by mouth 3 (three) times daily. MedPass  . OLANZapine (ZYPREXA) 5 MG tablet Take 1 tablet (5 mg total) by mouth at bedtime.  Marland Kitchen oxycodone (OXY-IR) 5 MG capsule Take 5 mg by mouth every 4 (four) hours as needed for pain.  . polyethylene glycol  (MIRALAX / GLYCOLAX) packet Take 17 g by mouth daily.  . Sodium Phosphates (RA SALINE ENEMA) 19-7 GM/118ML ENEM Place 1 each rectally as needed (for constipation).  . vitamin B-12 (CYANOCOBALAMIN) 500 MCG tablet Take 500 mcg by mouth daily.   No facility-administered encounter medications on file as of 03/20/2018.     PHYSICAL EXAM:   94% 98.1-80- 18  137/84 38 arm  circ General: NAD, frail appearing, obese Cardiovascular: regular rate and rhythm, S1S2 Pulmonary: clear all fields Abdomen: soft, nontender, + bowel sounds, constipation  endorsed GU: no suprapubic tenderness, incontinent of urine Extremities: no edema, no joint deformities Skin: no rashes, radiation area healing, no longer tender Neurological: Weakness, deficits from mental health dx, denies dizziness,  States she likes bingo but doesn't attend.  Cyndia Skeeters DNP, AGPCNP-BC

## 2018-03-28 NOTE — Progress Notes (Signed)
  Radiation Oncology         [phone] ________________________________  Name: Carolyn Parrish MRN: [id number]  Date: 03/01/2018  DOB: 11/26/1940  End of Treatment Note  Diagnosis:   78 y.o. 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 This was delivered using a 3-D conformal, 4 field technique. The patient then received a boost to the mastectomy scar. This delivered an additional 10 Gy in 5 fractions using an en face electron field. The total dose was 60.4 Gy.  Narrative: The patient tolerated radiation treatment relatively well.   The patient had some expected skin irritation with hyperpigmentation/erythema and dry desquamation at the lateral scar. Moist desquamation was not present at the end of treatment.  Plan: The patient has completed radiation treatment. The patient will return to radiation oncology clinic for routine followup in one month. I advised the patient to call or return sooner if they have any questions or concerns related to their recovery or treatment. ________________________________  Jodelle Gross, M.D., Ph.D.  This document serves as a record of services personally performed by Kyung Rudd, MD. It was created on his behalf by Rae Lips, a trained medical scribe. The creation of this record is based on the scribe's personal observations and the provider's statements to them. This document has been checked and approved by the attending provider.

## 2018-04-01 ENCOUNTER — Telehealth: Payer: Self-pay | Admitting: Radiation Oncology

## 2018-04-01 ENCOUNTER — Telehealth: Payer: Self-pay

## 2018-04-01 NOTE — Telephone Encounter (Signed)
I spoke with the paitent's HCPOA to change her mother's appt to 11 am on 04/08/18 instead of tomorrow. She reports her mother is otherwise doing well.

## 2018-04-01 NOTE — Telephone Encounter (Signed)
Spoke with Hilda Blades at [facility] in regards to changing appointment to 04/08/18 @11am . Debra verbalized understanding of the new appointment and advised that she will advise her daughter.

## 2018-04-02 ENCOUNTER — Ambulatory Visit: Admission: RE | Admit: 2018-04-02 | Payer: Medicare Other | Source: Ambulatory Visit | Admitting: Radiation Oncology

## 2018-04-08 ENCOUNTER — Encounter: Payer: Self-pay | Admitting: Radiation Oncology

## 2018-04-08 ENCOUNTER — Other Ambulatory Visit: Payer: Self-pay

## 2018-04-08 ENCOUNTER — Ambulatory Visit
Admission: RE | Admit: 2018-04-08 | Discharge: 2018-04-08 | Disposition: A | Discharge status: Home / Self Care | Payer: Medicare Other | Source: Ambulatory Visit | Attending: Radiation Oncology | Admitting: Radiation Oncology

## 2018-04-08 VITALS — BP 116/70 | HR 88 | Temp 98.6°F | Resp 20 | Ht 70.0 in | Wt 221.0 lb

## 2018-04-08 DIAGNOSIS — Z7984 Long term (current) use of oral hypoglycemic drugs: Secondary | ICD-10-CM | POA: Diagnosis not present

## 2018-04-08 DIAGNOSIS — Z170 Estrogen receptor positive status [ER+]: Secondary | ICD-10-CM | POA: Diagnosis not present

## 2018-04-08 DIAGNOSIS — D0512 Intraductal carcinoma in situ of left breast: Secondary | ICD-10-CM | POA: Insufficient documentation

## 2018-04-08 DIAGNOSIS — Z79899 Other long term (current) drug therapy: Secondary | ICD-10-CM | POA: Insufficient documentation

## 2018-04-08 DIAGNOSIS — Z7982 Long term (current) use of aspirin: Secondary | ICD-10-CM | POA: Diagnosis not present

## 2018-04-08 DIAGNOSIS — C50212 Malignant neoplasm of upper-inner quadrant of left female breast: Secondary | ICD-10-CM

## 2018-04-08 NOTE — Progress Notes (Signed)
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  Interval Since Last Radiation:  6 weeks   01/14/2018 - 03/01/2018: The patient initially received a dose of 50.4 Gy in 28 fractions to the left chest wall and supraclavicular region. This was delivered using a 3-D conformal, 4 field technique. The patient then received a boost to the mastectomy scar. This delivered an additional 10 Gy in 5 fractions using an en face electron field. The total dose was 60.4 Gy.  Narrative:  The patient returns today for routine follow-up. During treatment she did very well with radiotherapy and did develop dry desquamation toward the end of treatment.             On review of systems, the patient states she is doing pretty well. Her daughter reports her mother's skin is healing well. She reports that her mother did have some abdominal pain last week and was concerned she may have a UTI. This was checked and was negative at the facility, but she was constipated and after a laxative this improved. No other complaints are noted.  ALLERGIES:  is allergic to atorvastatin.  Meds: Current Outpatient Medications  Medication Sig Dispense Refill  . acetaminophen (TYLENOL) 325 MG tablet Take 325-650 mg by mouth every 4 (four) hours as needed (650mg  for arthritis pain, 325mg  for fever >101).     Marland Kitchen alum & mag hydroxide-simeth (MYLANTA) 200-200-20 MG/5ML suspension Take 30 mLs by mouth every 4 (four) hours as needed for indigestion or heartburn.    . anastrozole (ARIMIDEX) 1 MG tablet Take 1 tablet (1 mg total) by mouth daily. 90 tablet 4  . aspirin EC 81 MG tablet Take 81 mg by mouth daily.    . bisacodyl  (DULCOLAX) 10 MG suppository Place 10 mg rectally as needed for moderate constipation.    . camphor-menthol (SARNA) lotion Apply 1 application topically 2 (two) times daily as needed for itching.    . Cholecalciferol (VITAMIN D) 50 MCG (2000 UT) tablet Take 2,000 Units by mouth daily.    . divalproex (DEPAKOTE ER) 500 MG 24 hr tablet     . hyaluronate sodium (RADIAPLEXRX) GEL Apply 1 application topically 2 (two) times daily. Apply twice daily after radiation and at bedtime M-F    . metFORMIN (GLUCOPHAGE) 500 MG tablet Take 500 mg by mouth daily.    Marland Kitchen NUTRITIONAL SUPPLEMENT LIQD Take 120 mLs by mouth 3 (three) times daily. MedPass    . OLANZapine (ZYPREXA) 5 MG tablet Take 1 tablet (5 mg total) by mouth at bedtime. 30 tablet 0  . oxycodone (OXY-IR) 5 MG capsule Take 5 mg by mouth every 4 (four) hours as needed for pain.    . polyethylene glycol (MIRALAX / GLYCOLAX) packet Take 17 g by mouth daily.    . Sodium Phosphates (RA SALINE ENEMA) 19-7 GM/118ML ENEM Place 1 each rectally as needed (for constipation).    . vitamin B-12 (CYANOCOBALAMIN) 500 MCG tablet Take 500 mcg by mouth daily.    . divalproex (DEPAKOTE ER) 250 MG 24 hr tablet Take 500 mg by mouth daily. Take 500 mg daily     No current facility-administered medications for this  encounter.     Physical Findings:  height is 5\' 10"  (1.778 m) and weight is 221 lb (100.2 kg). Her oral temperature is 98.6 F (37 C). Her blood pressure is 116/70 and her pulse is 88. Her respiration is 20 and oxygen saturation is 98%.  Pain Assessment Pain Score: 0-No pain/10 In general this is a well appearing African American female in no acute distress. She's alert and oriented x4 and appropriate throughout the examination. Cardiopulmonary assessment is negative for acute distress and she exhibits normal effort. The left chest wall was examined and reveals no desquamation and mild hyperpigmentation of the mastectomy scar line and supraclavicular site.  Lab  Findings: Lab Results  Component Value Date   WBC 6.7 02/19/2018   HGB 13.8 02/19/2018   HCT 44.9 02/19/2018   MCV 96.4 02/19/2018   PLT 233 02/19/2018     Radiographic Findings: No results found.  Impression/Plan: 1. 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 The patient has been doing well since completion of radiotherapy. We discussed that we would be happy to continue to follow her as needed, but she will also continue to follow up with Dr. Jana Hakim in medical oncology. She was counseled on skin care as well as measures to avoid sun exposure to this area and this was documented for her facility. 2. Survivorship. We discussed the importance of survivorship evaluation and she is currently scheduled for this in the near future. She was also given the monthly calendar for access to resources offered within the cancer center.     Carola Rhine, PAC

## 2018-04-11 ENCOUNTER — Non-Acute Institutional Stay (SKILLED_NURSING_FACILITY): Payer: Medicare Other | Admitting: Internal Medicine

## 2018-04-11 ENCOUNTER — Encounter: Payer: Self-pay | Admitting: Internal Medicine

## 2018-04-11 DIAGNOSIS — E1151 Type 2 diabetes mellitus with diabetic peripheral angiopathy without gangrene: Secondary | ICD-10-CM | POA: Diagnosis not present

## 2018-04-11 DIAGNOSIS — C50912 Malignant neoplasm of unspecified site of left female breast: Secondary | ICD-10-CM

## 2018-04-11 DIAGNOSIS — F258 Other schizoaffective disorders: Secondary | ICD-10-CM | POA: Diagnosis not present

## 2018-04-11 NOTE — Patient Instructions (Signed)
See assessment and plan under each diagnosis in the problem list and acutely for this visit 

## 2018-04-11 NOTE — Assessment & Plan Note (Addendum)
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  I defer to her expertise as to continuing this medication

## 2018-04-11 NOTE — Assessment & Plan Note (Signed)
Status post completion of radiation therapy without significant complication Oncology monitor by Dr. Jana Hakim

## 2018-04-11 NOTE — Assessment & Plan Note (Signed)
Update A1c Serial Glucoses indicate good control and renal function remains normal on low-dose metformin

## 2018-04-11 NOTE — Progress Notes (Signed)
NURSING HOME LOCATION:  [facility] ROOM NUMBER:  [id number]  CODE STATUS: DNR   PCP:  Hendricks Limes MD  This is a nursing facility follow up of chronic medical diagnoses  Interim medical record and care since last [facility] visit was updated with review of diagnostic studies and change in clinical status since last visit were documented.  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 Post Radiation Oncology follow-up was 04/08/2018.  Desquamation over the breast related to the radiotherapy was healing well.  Follow-up as needed was planned with ongoing monitor by Dr. Jana Hakim of medical oncology;she continues on Arimidex. Palliative care is monitoring the patient to establish goals of care.  Patient is DNR. Other diagnoses include  type 2 diabetes, history of stroke, essential hypertension, congestive heart failure, and schizophrenia She is on Zyprexa as well as Depakote. She is also on low-dose metformin.  Serial glucoses have been 150 or less.  The most recent A1c was prediabetic at 6.3% on 10/14/2017.  Other labs are current and renal function is normal.  CBC and differential was normal as well.  Review of systems: She gave the date as March 5 and knew she lived in [place].  She answered the extensive review of systems as totally negative, answering with a monosyllabic "no". The patient is bedridden unless helped into a wheelchair.  When in the wheelchair she will tend to go through her roommates property and snacks when that resident is out in her power wheelchair.  For example she took her roommates cookies and hid them among her items.Her roommate is non ambulatory due to MS.  Constitutional: No fever, significant weight change, fatigue  Eyes: No redness, discharge, pain, vision change ENT/mouth: No nasal congestion,   purulent discharge, earache, change in hearing, sore throat  Cardiovascular: No chest pain, palpitations, paroxysmal nocturnal dyspnea, claudication, edema  Respiratory: No cough, sputum production, hemoptysis, DOE, significant snoring, apnea   Gastrointestinal: No heartburn, dysphagia, abdominal pain, nausea /vomiting, rectal bleeding, melena, change in bowels Genitourinary: No dysuria, hematuria, pyuria, incontinence, nocturia Musculoskeletal: No joint stiffness, joint swelling, weakness, pain Dermatologic: No rash, pruritus, change in appearance of skin Neurologic: No dizziness, headache, syncope, seizures, numbness, tingling Psychiatric: No significant anxiety, depression, insomnia, anorexia Endocrine: No change in hair/skin/nails, excessive thirst, excessive hunger, excessive urination  Hematologic/lymphatic: No significant bruising, lymphadenopathy, abnormal bleeding Allergy/immunology: No itchy/watery eyes, significant sneezing, urticaria, angioedema  Physical exam:  Pertinent or positive findings: She is obese.  She has bilateral ptosis.  She is wearing only the upper plate.  The facial hairs above the lip and at the chin are shaven in a goatee type pattern. She exhibits a pseudobulbar affect with intermittent laughing and then appearing being near tears. As she laughs she will exhibit a facial grimace and stare wide-eyed.  She also exhibits intermittent athetoid tremor of the upper extremities, right greater than left.  Abdomen is markedly protuberant but nontender.  Dorsalis pedis pulses are stronger than posterior tibial pulses.  She has trace edema.  Strength is fairly good in all extremities.  General appearance: Adequately nourished; no acute distress, increased work of breathing is present.   Lymphatic: No lymphadenopathy about the head, neck, axilla. Eyes: No conjunctival inflammation or lid edema is present. There is no scleral icterus. Ears:  External ear exam shows no  significant lesions or deformities.  Nose:  External nasal examination shows no deformity or inflammation. Nasal mucosa are pink and moist without lesions, exudates Oral exam:  Lips and gums are healthy appearing. There is no oropharyngeal erythema or exudate. Neck:  No thyromegaly, masses, tenderness noted.    Heart:  Normal rate and regular rhythm. S1 and S2 normal without gallop, murmur, click, rub .  Lungs: Chest clear to auscultation without wheezes, rhonchi, rales, rubs. Abdomen: Bowel sounds are normal. Abdomen is soft and nontender with no organomegaly, hernias, masses. GU: Deferred  Extremities:  No cyanosis, clubbing  Neurologic exam : Balance, Rhomberg, finger to nose testing could not be completed due to clinical state Skin: Warm & dry w/o tenting. No significant lesions or rash.  See summary under each active problem in the Problem List with associated updated therapeutic plan

## 2018-04-12 LAB — HEMOGLOBIN A1C: Hemoglobin A1C: 6.6

## 2018-04-18 ENCOUNTER — Non-Acute Institutional Stay: Payer: Medicare Other | Admitting: Adult Health Nurse Practitioner

## 2018-04-18 ENCOUNTER — Other Ambulatory Visit: Payer: Self-pay

## 2018-04-18 DIAGNOSIS — Z515 Encounter for palliative care: Secondary | ICD-10-CM

## 2018-04-18 NOTE — Progress Notes (Signed)
[facility] Consult Note Telephone: [phone]  Fax: [phone]  PATIENT NAME: Carolyn Parrish DOB: January 27, 1941 MRN: [id number]  PRIMARY CARE PROVIDER:   Hendricks Limes, MD  REFERRING PROVIDER:  Hendricks Limes, MD [address] [place], [place] [address]  RESPONSIBLE PARTY: Extended Emergency Contact Information Primary Emergency Contact: Carolyn, Parrish Mobile Phone: [phone] Relation: Daughter       RECOMMENDATIONS and PLAN:  1. Goals of care: DNR in place.  Has finished radiation treatment for breast cancer and continues on Arimidex.  Left message with daughter, Carolyn Parrish.  2. Skin care. Burns from radiation treatment pretty much resolved.  No complaints of pain  3. Constipation: No complaints of constipation.  Patient takes miralax daily  Patient stable with no reported falls, infections, or hospital visits since last visit.  Palliative will continue to follow every 1-2 months and as needed  I spent 20 minutes providing this consultation,  from 10:00 to 10:20. More than 50% of the time in this consultation was spent coordinating communication.   HISTORY OF PRESENT ILLNESS:  Carolyn Parrish is a 78 y.o. year old female with multiple medical problems including breast cancer, CAD, DM2, schizophrenia. Palliative Care was asked to help address goals of care.   CODE STATUS: DNR  PPS: 30% HOSPICE ELIGIBILITY/DIAGNOSIS: TBD  PHYSICAL EXAM:   General: NAD, frail appearing Cardiovascular: regular rate and rhythm Pulmonary: lung sounds clear, normal respiratory effort Abdomen: soft, obese, nontender, + bowel sounds GU: no suprapubic tenderness, incontinent of urine Extremities: no edema, no joint deformities Neurological: Weakness, uses wheelchair for ambulation, A&Ox3, flat affect  PAST MEDICAL HISTORY:  Past Medical History:  Diagnosis Date  . Cancer (HCC)    BREAST CANCER  . CHF (congestive heart  failure) ([place])   . Diabetes mellitus without complication ([place])   . Hyperlipidemia   . Hypertension   . Schizoaffective disorder ([place])   . Stroke [facility])     SOCIAL HX:  Social History   Tobacco Use  . Smoking status: Never Smoker  . Smokeless tobacco: Never Used  Substance Use Topics  . Alcohol use: Not Currently    ALLERGIES:  Allergies  Allergen Reactions  . Atorvastatin     UNSPECIFIED REACTION      PERTINENT MEDICATIONS:  Outpatient Encounter Medications as of 04/18/2018  Medication Sig  . acetaminophen (TYLENOL) 325 MG tablet Take 650 mg by mouth every 4 (four) hours as needed (650mg  for arthritis pain, 325mg  for fever >101).   Marland Kitchen alum & mag hydroxide-simeth (MYLANTA) 200-200-20 MG/5ML suspension Take 30 mLs by mouth every 4 (four) hours as needed for indigestion or heartburn.  . anastrozole (ARIMIDEX) 1 MG tablet Take 1 tablet (1 mg total) by mouth daily.  Marland Kitchen aspirin EC 81 MG tablet Take 81 mg by mouth daily.  . bisacodyl (DULCOLAX) 10 MG suppository Place 10 mg rectally as needed for moderate constipation.  . camphor-menthol (SARNA) lotion Apply 1 application topically 2 (two) times daily as needed for itching.  . Cholecalciferol (VITAMIN D) 50 MCG (2000 UT) tablet Take 2,000 Units by mouth daily.  . divalproex (DEPAKOTE ER) 500 MG 24 hr tablet DIVALPROEX SOD ER 500 MG TAB TAKE 1 TABLET BY MOUTH ONCE DAILY (DO NOT CRUSH) FOR BIPOLAR  . docusate sodium (COLACE) 100 MG capsule Take 100 mg by mouth 2 (two) times daily. For constipation  . hyaluronate sodium (RADIAPLEXRX) GEL Apply 1 application topically 2 (two) times daily. Apply twice  daily after radiation and at bedtime M-F  . metFORMIN (GLUCOPHAGE) 500 MG tablet Take 500 mg by mouth daily.  Marland Kitchen NUTRITIONAL SUPPLEMENT LIQD Take 120 mLs by mouth daily. MedPass   . OLANZapine (ZYPREXA) 5 MG tablet Take 1 tablet (5 mg total) by mouth at bedtime.  Marland Kitchen oxycodone (OXY-IR) 5 MG capsule Take 5 mg by mouth every 4 (four) hours as needed  for pain.  . polyethylene glycol (MIRALAX / GLYCOLAX) packet Take 17 g by mouth daily.  . Sodium Phosphates (RA SALINE ENEMA) 19-7 GM/118ML ENEM Place 1 each rectally as needed (for constipation).  . vitamin B-12 (CYANOCOBALAMIN) 500 MCG tablet Take 500 mcg by mouth daily.   No facility-administered encounter medications on file as of 04/18/2018.       Lougenia Morrissey Jenetta Downer, NP

## 2018-05-01 LAB — LIPID PANEL
Cholesterol: 172 (ref 0–200)
HDL: 42 (ref 35–70)
LDL Cholesterol: 113
LDl/HDL Ratio: 4.1
Triglycerides: 88 (ref 40–160)

## 2018-05-21 ENCOUNTER — Ambulatory Visit: Payer: Self-pay | Admitting: Oncology

## 2018-05-21 ENCOUNTER — Other Ambulatory Visit: Payer: Self-pay

## 2018-07-29 ENCOUNTER — Telehealth: Payer: Self-pay | Admitting: Oncology

## 2018-07-29 NOTE — Telephone Encounter (Signed)
I talk with daughter

## 2018-07-31 ENCOUNTER — Other Ambulatory Visit: Payer: Self-pay | Admitting: Oncology

## 2018-08-01 ENCOUNTER — Non-Acute Institutional Stay (SKILLED_NURSING_FACILITY): Payer: Medicare Other | Admitting: Internal Medicine

## 2018-08-01 ENCOUNTER — Encounter: Payer: Self-pay | Admitting: Internal Medicine

## 2018-08-01 DIAGNOSIS — E1151 Type 2 diabetes mellitus with diabetic peripheral angiopathy without gangrene: Secondary | ICD-10-CM | POA: Diagnosis not present

## 2018-08-01 DIAGNOSIS — M545 Low back pain, unspecified: Secondary | ICD-10-CM

## 2018-08-01 DIAGNOSIS — I10 Essential (primary) hypertension: Secondary | ICD-10-CM | POA: Diagnosis not present

## 2018-08-01 DIAGNOSIS — R4189 Other symptoms and signs involving cognitive functions and awareness: Secondary | ICD-10-CM

## 2018-08-01 DIAGNOSIS — R29818 Other symptoms and signs involving the nervous system: Secondary | ICD-10-CM | POA: Diagnosis not present

## 2018-08-01 NOTE — Assessment & Plan Note (Signed)
MMSE update clinically appropriate based on dramatic progression of possible dementia on serial testing.

## 2018-08-01 NOTE — Assessment & Plan Note (Signed)
Most recent A1c was borderline diabetes at 6.6% on 04/12/2018.  When the coronavirus quarantine is lifted A1c will be rechecked.  Serial glucoses indicate good control.

## 2018-08-01 NOTE — Assessment & Plan Note (Addendum)
BP controlled; no indication for antihypertensive medications  

## 2018-08-01 NOTE — Progress Notes (Signed)
NURSING HOME LOCATION:  [facility] ROOM NUMBER:  226-A  CODE STATUS:  Full Code  PCP:  Hendricks Limes, MD  [place] [address]  This is a nursing facility follow up of chronic medical diagnoses.   Interim medical record and care since last [place] visit was updated with review of diagnostic studies and change in clinical status since last visit were documented.  HPI: The patient is a permanent resident of the facility with history of stroke, schizoaffective disorder, essential hypertension, dyslipidemia, diabetes, congestive heart failure, and history of breast cancer. Surgeries include left mastectomy and axillary sentinel node biopsy as well as cholecystectomy. Glucoses are 85-110 indicating good control.  A1c was 6.6% on 04/12/2018 which is borderline diabetes.  On 3/25 her LDL was mildly above goal at 113.  Other labs were performed in January and were normal.  Review of systems: Dementia invalidated responses. Date given as "Wednesday 220".  She cannot provide a meaningful history.  She stated that her back has been hurting for 2 weeks.  She could not quantitate or qualitate the pain.  It was difficult to determine whether she was having associated any radicular pain, numbness, or tingling.  It was also unclear as to whether she had any stool or urinary incontinence. Staff were unaware of any complaints of back pain.  Constitutional: No fever, significant weight change, fatigue  Eyes: No redness, discharge, pain, vision change ENT/mouth: No nasal congestion,  purulent discharge, earache, change in hearing, sore throat  Cardiovascular: No chest pain, palpitations, paroxysmal nocturnal dyspnea, claudication, edema  Respiratory: No cough, sputum production, hemoptysis, DOE, significant snoring, apnea   Gastrointestinal: No heartburn, dysphagia, abdominal pain, nausea /vomiting, rectal bleeding, melena, change in bowels Genitourinary: No dysuria,  hematuria, pyuria, incontinence, nocturia Musculoskeletal: No joint stiffness, joint swelling, weakness Dermatologic: No rash, pruritus, change in appearance of skin Neurologic: No dizziness, headache, syncope, seizures Psychiatric: No significant anxiety, depression Endocrine: No change in hair/skin/nails, excessive thirst, excessive hunger, excessive urination  Hematologic/lymphatic: No significant bruising, lymphadenopathy, abnormal bleeding  Physical exam:  Pertinent or positive findings: She exhibits a wide-eyed stare; pupils are small.  Her speech pattern is of "baby talk" there is asymmetry of the lower lip with questionable sagging on the right.  Wearing only the upper plate.  Hirsutism is noted over the chin.  There is evidence of left mastectomy.  When I attempted straight leg raising and range of motion of the lower extremity she denied any back pain.  She stated that the left foot hurt with SLR.  She had difficulty following commands.  She was unable to hold up the correct number of fingers on either hand when requested.  Initially she seemed weak to opposition but the delayed response was also associated with athetoid movements of the extremities, especially the upper extremities.  Strength actually seemed good.  Dorsalis pedis pulses are stronger than the posterior tibial pulses.  She had a Leaf shaped hyperpigmentation was present in the left supraclavicular area.  General appearance: Adequately nourished; no acute distress, increased work of breathing is present.   Lymphatic: No lymphadenopathy about the head, neck, axilla. Eyes: No conjunctival inflammation or lid edema is present. There is no scleral icterus. Ears:  External ear exam shows no significant lesions or deformities.   Nose:  External nasal examination shows no deformity or inflammation. Nasal mucosa are pink and moist without lesions, exudates Oral exam:  Lips and gums are healthy appearing. There is no  oropharyngeal  erythema or exudate. Neck:  No thyromegaly, masses, tenderness noted.    Heart:  Normal rate and regular rhythm. S1 and S2 normal without gallop, murmur, click, rub .  Lungs: Chest clear to auscultation without wheezes, rhonchi, rales, rubs. Abdomen: Bowel sounds are normal. Abdomen is soft and nontender with no organomegaly, hernias, masses. GU: Deferred  Extremities:  No cyanosis, clubbing, edema  Neurologic exam : Balance, Rhomberg, finger to nose testing could not be completed due to clinical state Skin: Warm & dry w/o tenting. No significant rash.  See summary under each active problem in the Problem List with associated updated therapeutic plan

## 2018-08-03 NOTE — Patient Instructions (Signed)
See assessment and plan under each diagnosis in the problem list and acutely for this visit 

## 2018-08-05 ENCOUNTER — Telehealth: Payer: Self-pay | Admitting: *Deleted

## 2018-08-05 NOTE — Telephone Encounter (Signed)
This RN spoke with pt's daughter Kennith Gain per scheduled visit tomorrow.   Pt is a resident at [facility], Kennith Gain has not seen ( in person ) her mother since March 2020 due to Covid protocol.  Concern is related to appointment and due to mother's physical and mental status if visit would be productive since pt would come alone.  This RN discussed reason for visit which is to monitor tolerance of anastrazole, and ideally Kennith Gain would know how her mom is doing and inform MD.  Due to pt's inability to communicate well plan is to cancel appointment for 6/30 and follow up per scheduled visit in Sept.  This RN called to [facility] at [phone] and spoke with [place] due to pt's nurse not available.  Daleen Snook will inform appropriate staff of cancelling of appointment as well as request to call this RN with update on pt's status.  Of note pt was seen 08/01/2018 by attending MD Dr Linna Darner at [facility]. Dictation is available in [place].  Daughter is aware of above.

## 2018-08-06 ENCOUNTER — Other Ambulatory Visit: Payer: Self-pay

## 2018-08-06 ENCOUNTER — Ambulatory Visit: Payer: Self-pay | Admitting: Oncology

## 2018-10-17 ENCOUNTER — Encounter: Payer: Self-pay | Admitting: Internal Medicine

## 2018-10-17 ENCOUNTER — Non-Acute Institutional Stay (SKILLED_NURSING_FACILITY): Payer: Medicare Other | Admitting: Internal Medicine

## 2018-10-17 DIAGNOSIS — F258 Other schizoaffective disorders: Secondary | ICD-10-CM | POA: Diagnosis not present

## 2018-10-17 DIAGNOSIS — Z170 Estrogen receptor positive status [ER+]: Secondary | ICD-10-CM

## 2018-10-17 DIAGNOSIS — E1151 Type 2 diabetes mellitus with diabetic peripheral angiopathy without gangrene: Secondary | ICD-10-CM

## 2018-10-17 DIAGNOSIS — C50212 Malignant neoplasm of upper-inner quadrant of left female breast: Secondary | ICD-10-CM | POA: Diagnosis not present

## 2018-10-17 DIAGNOSIS — I110 Hypertensive heart disease with heart failure: Secondary | ICD-10-CM | POA: Diagnosis not present

## 2018-10-17 NOTE — Patient Instructions (Signed)
See assessment and plan under each diagnosis in the problem list and acutely for this visit 

## 2018-10-17 NOTE — Assessment & Plan Note (Addendum)
Oncology follow-up scheduled later this month, ? 9/28

## 2018-10-17 NOTE — Assessment & Plan Note (Addendum)
On metformin glucoses range from 81-105.  No change indicated in present therapy.

## 2018-10-17 NOTE — Progress Notes (Signed)
NURSING HOME LOCATION:  [facility] ROOM NUMBER:  226-A  CODE STATUS:  Full Code  PCP:  Hendricks Limes, MD  [place] [address]   This is a nursing facility follow up of chronic medical diagnoses.  Interim medical record and care since last [place] visit was updated with review of diagnostic studies and change in clinical status since last visit were documented.  HPI: She is a permanent resident of the facility with medical diagnoses of essential hypertension, hypertensive heart disease with heart failure, diabetes with peripheral vascular complications, CVA, neurocognitive defects, schizoaffective disorder, and breast cancer. Surgeries include complete mastectomy and cholecystectomy. She remains on Demadex.  She is also on Depakote for mood disorder as well as Zyprexa.  She has Tylenol 650 mg every 4 hours as needed ordered which approaches the potentially hepatotoxic dose.  Review of systems: She is oriented to date and realizes she has an upcoming appointment with her oncologist.  She could not remember his name however.  Her only complaint is that her back hurts when she lies supine.  She does mention that she has bilateral tearing with meals.  Constitutional: No fever, significant weight change, fatigue  Eyes: No redness, discharge, pain, vision change ENT/mouth: No nasal congestion,  purulent discharge, earache, change in hearing, sore throat  Cardiovascular: No chest pain, palpitations, paroxysmal nocturnal dyspnea, claudication, edema  Respiratory: No cough, sputum production, hemoptysis, DOE, significant snoring, apnea   Gastrointestinal: No heartburn, dysphagia, abdominal pain, nausea /vomiting, rectal bleeding, melena, change in bowels Genitourinary: No dysuria, hematuria, pyuria, incontinence, nocturia Musculoskeletal: No joint stiffness, joint swelling Dermatologic: No rash, pruritus, change in appearance of skin Neurologic: No  dizziness, headache, syncope, seizures, numbness, tingling Psychiatric: No significant anxiety, depression, insomnia, anorexia Endocrine: No change in hair/skin/nails, excessive thirst, excessive hunger, excessive urination  Hematologic/lymphatic: No significant bruising, lymphadenopathy, abnormal bleeding Allergy/immunology: No itchy/watery eyes, significant sneezing, urticaria, angioedema  Physical exam:  Pertinent or positive findings: She exhibits a singsong baby talk type speech character and cadence.  The left eye exhibits a wide-eyed stare appearance.  There is slight ptosis of the right eye.  Nasolabial folds are slightly asymmetric.  She is wearing only the upper plate.  She exhibits a slight tachycardia.  Abdomen is protuberant.  Pedal pulses are palpable.  The lower extremities are stronger than the upper extremities.  She exhibits intermittent coarse intention tremor of both hands which is intensified with strength testing by confrontation.  General appearance: Adequately nourished; no acute distress, increased work of breathing is present.   Lymphatic: No lymphadenopathy about the head, neck, axilla. Eyes: No conjunctival inflammation or lid edema is present. There is no scleral icterus. Ears:  External ear exam shows no significant lesions or deformities.   Nose:  External nasal examination shows no deformity or inflammation. Nasal mucosa are pink and moist without lesions, exudates Oral exam:  Lips and gums are healthy appearing. There is no oropharyngeal erythema or exudate. Neck:  No thyromegaly, masses, tenderness noted.    Heart:  No gallop, murmur, click, rub .  Lungs:  without wheezes, rhonchi, rales, rubs. Abdomen: Bowel sounds are normal. Abdomen is soft and nontender with no organomegaly, hernias, masses. GU: Deferred  Extremities:  No cyanosis, clubbing, edema  Neurologic exam : Balance, Rhomberg, finger to nose testing could not be completed due to clinical state  Skin: Warm & dry w/o tenting. No significant lesions or rash.  See summary under each active problem in  the Problem List with associated updated therapeutic plan

## 2018-10-17 NOTE — Assessment & Plan Note (Addendum)
Clinically stable w/o reported behavioral issues despite isolation of COVID quarantine.  No change in present psychotropic medications.  Psych NP follow-up following lifting of the corona quarantine.

## 2018-10-18 ENCOUNTER — Telehealth: Payer: Self-pay | Admitting: Internal Medicine

## 2018-10-18 NOTE — Assessment & Plan Note (Signed)
Clinically no evidence of cardiac decompensation presently

## 2018-10-18 NOTE — Telephone Encounter (Signed)
Correct date of Oncology appt in question . She believes appt 9/28.

## 2018-10-18 NOTE — Assessment & Plan Note (Deleted)
Clinically stable w/o reported behavioral issues despite isolation of COVID quarantine

## 2018-10-21 NOTE — Telephone Encounter (Signed)
Can you help with this Val?

## 2018-10-25 ENCOUNTER — Telehealth: Payer: Self-pay | Admitting: *Deleted

## 2018-10-25 NOTE — Telephone Encounter (Signed)
Callback to pt POA Macario Carls regarding suvivorship appt 9/21 @ 2pm. Didn't want to bring mother to facility if it was not medically necessary. Per Mendel Ryder okay for pt to keep appt and to do visit over phone. POA  verbalized understanding.

## 2018-10-28 ENCOUNTER — Inpatient Hospital Stay: Payer: Medicare Other | Attending: Adult Health | Admitting: Adult Health

## 2018-10-28 ENCOUNTER — Other Ambulatory Visit: Payer: Self-pay

## 2018-10-28 DIAGNOSIS — Z170 Estrogen receptor positive status [ER+]: Secondary | ICD-10-CM

## 2018-10-28 DIAGNOSIS — C50212 Malignant neoplasm of upper-inner quadrant of left female breast: Secondary | ICD-10-CM

## 2018-10-28 NOTE — Progress Notes (Unsigned)
SURVIVORSHIP VIRTUAL VISIT:  I connected with *** on 10/28/18 at  2:00 PM EDT by *** and verified that I am speaking with Carolyn Parrish correct person using two identifiers.  I discussed Carolyn Parrish limitations, risks, security and privacy concerns of performing an evaluation and management service by telephone and Carolyn Parrish availability of in person appointments. I also discussed with Carolyn Parrish Parrish that there may be a Parrish responsible charge related to this service. Carolyn Parrish Parrish expressed understanding and agreed to proceed.   BRIEF ONCOLOGIC HISTORY:  Oncology History  Malignant neoplasm of upper-inner quadrant of left breast in female, estrogen receptor positive ([place])  09/07/2017 Initial Diagnosis    [facility] skilled nursing facility resident status post left breast upper outer quadrant biopsy 09/05/2017 for a clinical T2N0, stage Ib invasive ductal carcinoma, grade 2, estrogen and progesterone receptor positive, HER-2 not amplified, with an MIB-1 of 15%.          10/30/2017 Surgery   status post left mastectomy 10/30/2017, for a pT4 pN1, stage IIIB invasive ductal carcinoma, grade 2, with lymphovascular invasion and involvement of Carolyn Parrish nipple and epidermis of Carolyn Parrish areola, but with negative margins             (a) 1 of 2 non-sentinel lymph nodes removed positive straight capsular extension   11/21/2017 Cancer Staging   Staging form: Breast, AJCC 8th Edition - Pathologic: Stage IIIA (pT4a, pN1a, cM0, G2, ER+, PR+, HER2-) - Signed by Gardenia Phlegm, NP on 11/21/2017   12/2017 -  Anti-estrogen oral therapy   Anastrozole started on 12/12/2017   01/08/2018 - 03/01/2018 Radiation Therapy   Adjuvant radiation      INTERVAL HISTORY:  Carolyn Parrish Parrish to review her survivorship care plan detailing her treatment course for breast cancer, as well as monitoring long-term side effects of that treatment, education regarding health maintenance, screening, and overall wellness and health promotion.     Overall, [name] reports feeling quite well.  She is taking Anastrozole daily.  She does have some arthralgias. She denies hot flashes.    REVIEW OF SYSTEMS:  Review of Systems - Oncology Breast: Denies any new nodularity, masses, tenderness, nipple changes, or nipple discharge.      ONCOLOGY TREATMENT TEAM:  1. Surgeon:  Dr. Marland Kitchen at [facility] 2. Medical Oncologist: Dr. Marland Kitchen  3. Radiation Oncologist: Dr. Marland Kitchen    PAST MEDICAL/SURGICAL HISTORY:  Past Medical History:  Diagnosis Date  . Cancer (HCC)    BREAST CANCER  . CHF (congestive heart failure) ([place])   . Diabetes mellitus without complication ([place])   . Hyperlipidemia   . Hypertension   . Schizoaffective disorder ([place])   . Stroke [facility])    Past Surgical History:  Procedure Laterality Date  . CHOLECYSTECTOMY     2016  . MASTECTOMY COMPLETE / SIMPLE Left 10/30/2017  . SIMPLE MASTECTOMY WITH AXILLARY SENTINEL NODE BIOPSY Left 10/30/2017   Procedure: LEFT SIMPLE MASTECTOMY;  Surgeon: Erroll Luna, MD;  Location: [place] [facility];  Service: General;  Laterality: Left;     ALLERGIES:  Allergies  Allergen Reactions  . Atorvastatin     UNSPECIFIED REACTION      CURRENT MEDICATIONS:  Outpatient Encounter Medications as of 10/28/2018  Medication Sig  . acetaminophen (TYLENOL) 325 MG tablet Take 650 mg by mouth every 4 (four) hours as needed ('650mg'$  for arthritis pain, '325mg'$  for fever >101).   Marland Kitchen alum & mag hydroxide-simeth (MYLANTA) 063-016-01 MG/5ML suspension Take 30 mLs by mouth every 4 (four) hours as  needed for indigestion or heartburn.  . anastrozole (ARIMIDEX) 1 MG tablet Take 1 tablet (1 mg total) by mouth daily.  Marland Kitchen aspirin EC 81 MG tablet Take 81 mg by mouth daily.  . bisacodyl (DULCOLAX) 10 MG suppository Place 10 mg rectally as needed for moderate constipation.  . camphor-menthol (SARNA) lotion Apply 1 application topically 2 (two) times daily as needed for itching.  . Cholecalciferol (VITAMIN D) 50 MCG (2000 UT) tablet  Take 2,000 Units by mouth daily.   . divalproex (DEPAKOTE ER) 500 MG 24 hr tablet Take 500 mg by mouth daily.   Marland Kitchen docusate sodium (COLACE) 100 MG capsule Take 100 mg by mouth 2 (two) times daily. For constipation  . metFORMIN (GLUCOPHAGE) 500 MG tablet Take 500 mg by mouth daily.   Marland Kitchen OLANZapine (ZYPREXA) 5 MG tablet Take 1 tablet (5 mg total) by mouth at bedtime.  Marland Kitchen oxycodone (OXY-IR) 5 MG capsule Take 5 mg by mouth every 4 (four) hours as needed for pain.  . polyethylene glycol (MIRALAX / GLYCOLAX) packet Take 17 g by mouth daily.  . sennosides-docusate sodium (SENOKOT-S) 8.6-50 MG tablet Take 1 tablet by mouth at bedtime.  . Sodium Phosphates (RA SALINE ENEMA) 19-7 GM/118ML ENEM Place 1 each rectally as needed (for constipation).  . vitamin B-12 (CYANOCOBALAMIN) 500 MCG tablet Take 500 mcg by mouth daily.   No facility-administered encounter medications on file as of 10/28/2018.      ONCOLOGIC FAMILY HISTORY:  No family history on file.   GENETIC COUNSELING/TESTING: ***  SOCIAL HISTORY:  Social History   Socioeconomic History  . Marital status: Widowed    Spouse name: Not on file  . Number of children: Not on file  . Years of education: Not on file  . Highest education level: Not on file  Occupational History  . Not on file  Social Needs  . Financial resource strain: Not hard at all  . Food insecurity    Worry: Never true    Inability: Never true  . Transportation needs    Medical: No    Non-medical: No  Tobacco Use  . Smoking status: Never Smoker  . Smokeless tobacco: Never Used  Substance and Sexual Activity  . Alcohol use: Not Currently  . Drug use: Never  . Sexual activity: Not on file  Lifestyle  . Physical activity    Days per week: 0 days    Minutes per session: 0 min  . Stress: To some extent  Relationships  . Social connections    Talks on phone: More than three times a week    Gets together: More than three times a week    Attends religious service:  Never    Active member of club or organization: No    Attends meetings of clubs or organizations: Never    Relationship status: Widowed  . Intimate partner violence    Fear of current or ex partner: No    Emotionally abused: No    Physically abused: No    Forced sexual activity: No  Other Topics Concern  . Not on file  Social History Narrative  . Not on file     OBSERVATIONS/OBJECTIVE:   LABORATORY DATA:  None for this visit.  DIAGNOSTIC IMAGING:  None for this visit.      ASSESSMENT AND PLAN:  Ms.. Parrish is a pleasant 78 y.o. 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  [facility] for our initial meeting and routine follow-up post-completion of treatment for breast cancer.    1. Stage IIIA left breast cancer:  Carolyn Parrish Parrish is continuing to recover from definitive treatment for breast cancer. She will follow-up with her medical oncologist, Dr. Ross Ludwig in *** with history and physical exam per surveillance protocol.  She will continue her anti-estrogen therapy with ***. Thus far, she is tolerating Carolyn Parrish *** well, with minimal side effects. She was instructed to make Dr. Lindi Adie or myself aware if she begins to experience any worsening side effects of Carolyn Parrish medication and I could see her back in clinic to help manage those side effects, as needed. Her mammogram is due ***; orders placed today.  Her breast density is category ***. Today, a comprehensive survivorship care plan and treatment summary was reviewed with Carolyn Parrish Parrish today detailing her breast cancer diagnosis, treatment course, potential late/long-term effects of treatment, appropriate follow-up care with recommendations for Carolyn Parrish future, and Parrish education resources.  A copy of this summary, along with a letter will be  sent to Carolyn Parrish Parrish's primary care provider via mail/fax/In Basket message after today's visit.    #. Problem(s) at Visit______________  #. Bone health:  Given Carolyn Parrish Parrish's age/history of breast cancer and her current treatment regimen including anti-estrogen therapy with Anastrozole, she is at risk for bone demineralization.  Her last DEXA scan was ***, which showed ***.  In Carolyn Parrish meantime, she was encouraged to increase her consumption of foods rich in calcium, as well as increase her weight-bearing activities.  She was given education on specific activities to promote bone health.  #. Cancer screening:  Due to Carolyn Parrish Parrish's history and her age, she should receive screening for skin cancers, colon cancer, and gynecologic cancers.  Carolyn Parrish information and recommendations are listed on Carolyn Parrish Parrish's comprehensive care plan/treatment summary and were reviewed in detail with Carolyn Parrish Parrish.    #. Health maintenance and wellness promotion: Carolyn Parrish Parrish was encouraged to consume 5-7 servings of fruits and vegetables per day. We reviewed Carolyn Parrish "Nutrition Rainbow" handout, as well as Carolyn Parrish handout "Take Control of Your Health and Reduce Your Cancer Risk" from Carolyn Parrish [place].  She was also encouraged to engage in moderate to vigorous exercise for 30 minutes per day most days of Carolyn Parrish week. We discussed Carolyn Parrish [employer] fitness program, which is designed for cancer survivors to help them become more physically fit after cancer treatments.  She was instructed to limit her alcohol consumption and continue to abstain from tobacco use/***was encouraged stop smoking.     #. Support services/counseling: It is not uncommon for this period of Carolyn Parrish Parrish's cancer care trajectory to be one of many emotions and stressors.  We discussed how this can be increasingly difficult during Carolyn Parrish times of quarantine and social distancing due to Carolyn Parrish COVID-19 pandemic.   She was given information regarding our available services and  encouraged to contact me with any questions or for help enrolling in any of our support group/programs.    Follow up instructions:    -Return to cancer center ***  -Mammogram due in *** -Follow up with surgery *** -She is welcome to return back to [facility] at any time; no additional follow-up needed at this time.  -Consider referral back to survivorship as a long-term survivor for continued surveillance  Carolyn Parrish Parrish was provided an opportunity to ask questions and all were answered. Carolyn Parrish Parrish  agreed with Carolyn Parrish plan and demonstrated an understanding of Carolyn Parrish instructions.   Carolyn Parrish Parrish was advised to call back or seek an in-person evaluation if Carolyn Parrish symptoms worsen or if Carolyn Parrish condition fails to improve as anticipated.   I provided *** minutes of {Blank single:19197::"face-to-face video visit time","non face-to-face telephone visit time"} during this encounter, and > 50% was spent counseling as documented under my assessment & plan.  Scot Dock, NP

## 2018-11-25 IMAGING — DX DG CHEST 1V PORT
1 series · 1 of 1 positions shown · non-contrast
Comparison: None.

CLINICAL DATA: Sepsis, fever.

EXAM:
PORTABLE CHEST 1 VIEW

[chest ap]
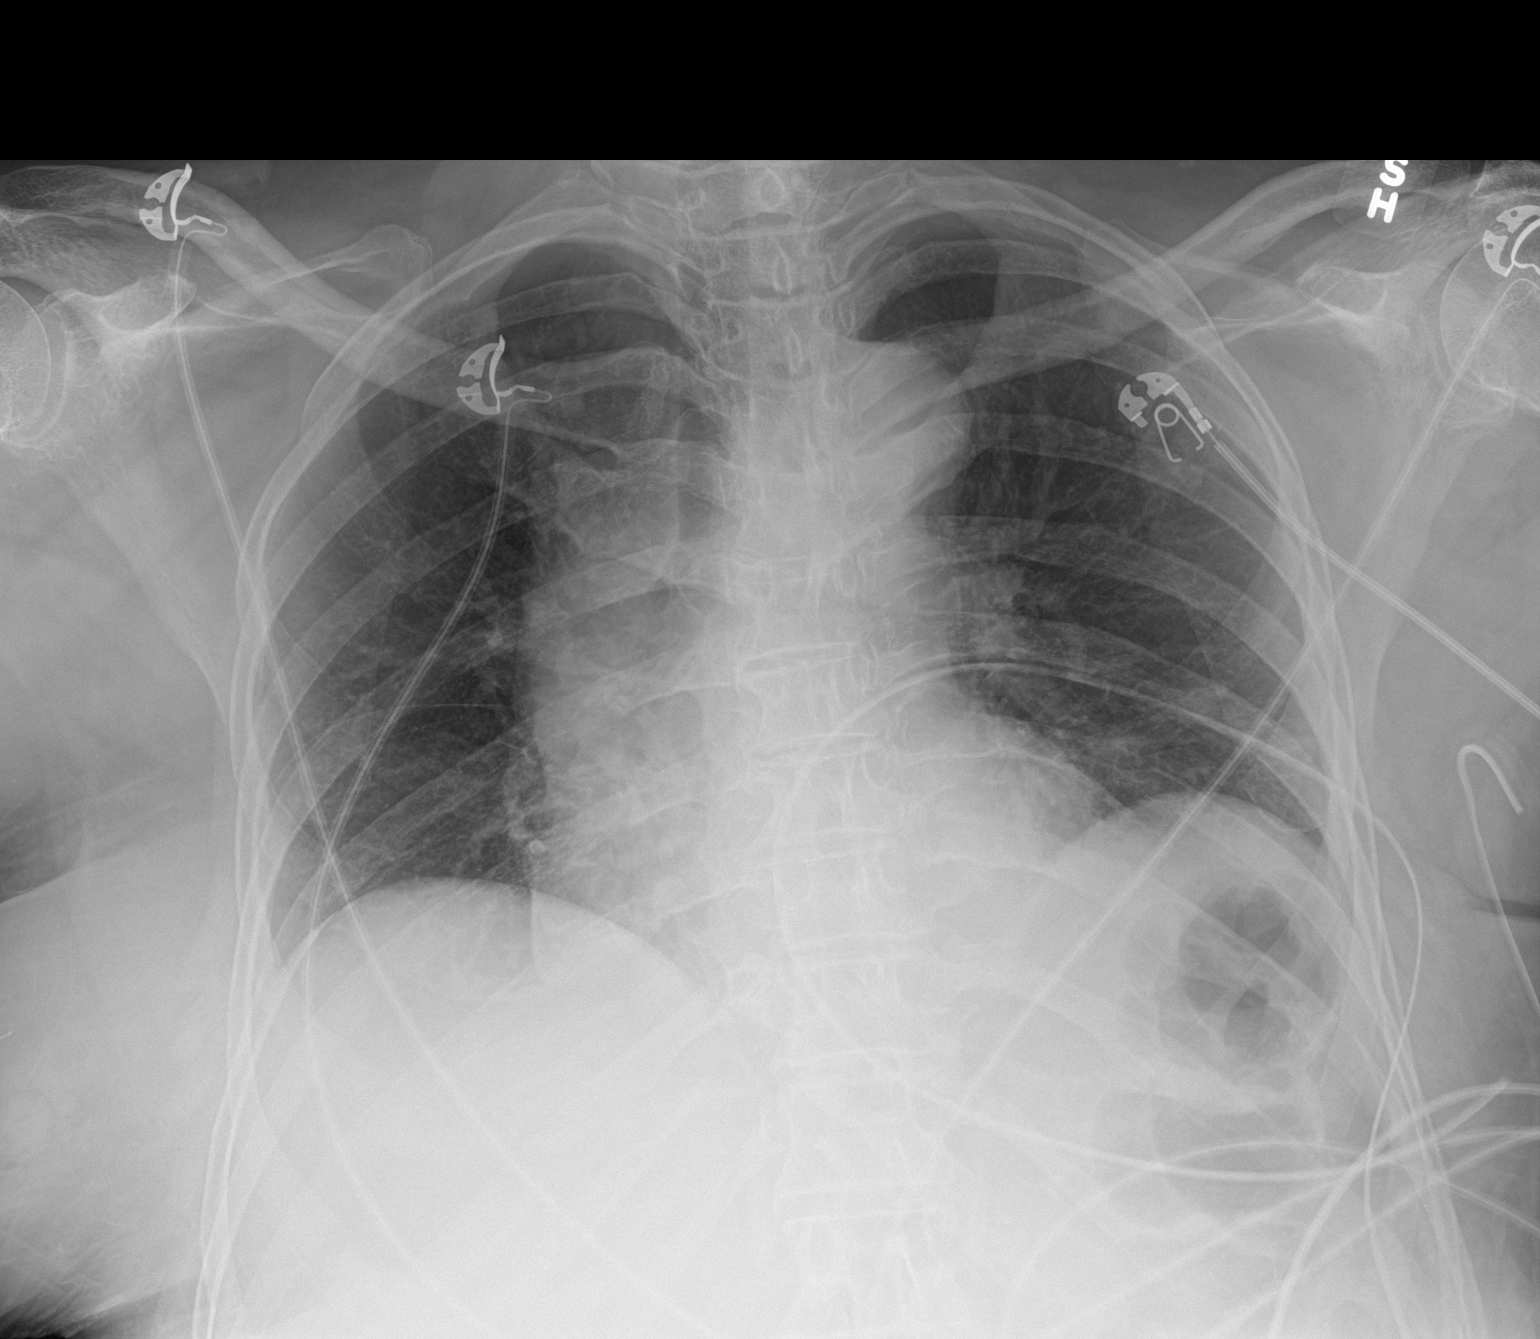

[1 of 1 positions shown; findings below may reference images not displayed]

FINDINGS: The heart size and mediastinal contours are within normal limits.
Both lungs are clear. The visualized skeletal structures are
unremarkable.
IMPRESSION: No active disease.

## 2018-11-27 LAB — LIPID PANEL
Cholesterol: 169 (ref 0–200)
HDL: 47 (ref 35–70)
LDL Cholesterol: 107
Triglycerides: 76 (ref 40–160)

## 2018-11-27 LAB — HEPATIC FUNCTION PANEL
ALT: 11 (ref 7–35)
AST: 13 (ref 13–35)
Bilirubin, Total: 0.4

## 2018-11-27 LAB — VITAMIN D 25 HYDROXY (VIT D DEFICIENCY, FRACTURES): Vit D, 25-Hydroxy: >60

## 2018-11-27 LAB — BASIC METABOLIC PANEL
BUN: 15 (ref 5–18)
Creatinine: 0.7 (ref 0.5–1.1)
Glucose: 95
Potassium: 4.3 (ref 3.4–5.3)
Sodium: 140 (ref 137–147)
Sodium: 146 (ref 137–147)

## 2018-11-27 LAB — CBC AND DIFFERENTIAL
HCT: 38 (ref 36–46)
Hemoglobin: 12.9 (ref 12.0–16.0)
Platelets: 201 (ref 150–399)
WBC: 7.9

## 2018-11-27 LAB — VITAMIN B12: Vitamin B-12: 1365

## 2018-11-27 LAB — HEMOGLOBIN A1C: Hemoglobin A1C: 5.9

## 2018-12-10 LAB — HM DIABETES FOOT EXAM

## 2019-01-21 IMAGING — CT CT HEAD W/O CM
4 series · 17 of 47 positions shown, 19 images · non-contrast
Comparison: 01/08/2018 CT head.

CLINICAL DATA: 77 y/o  F; acute encephalopathy.

EXAM:
CT HEAD WITHOUT CONTRAST
TECHNIQUE: Contiguous axial images were obtained from the base of the skull
through the vertex without intravenous contrast.

[Series 3: head wo · axial · 0.44mm/px · z∈[-146,-26]mm · 7 of 34 slices shown, 9 images]
[im 5/34  brain]
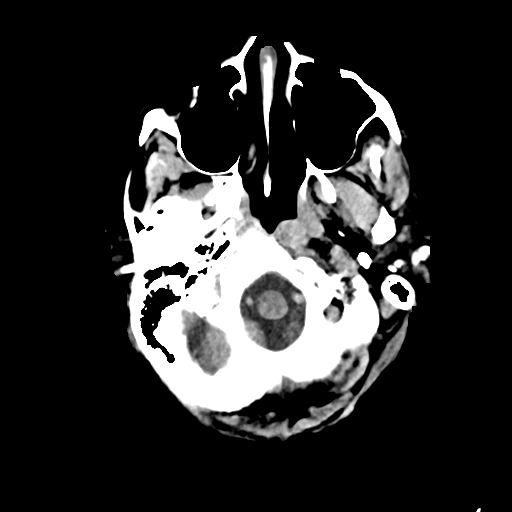
[im 5/34  bone]
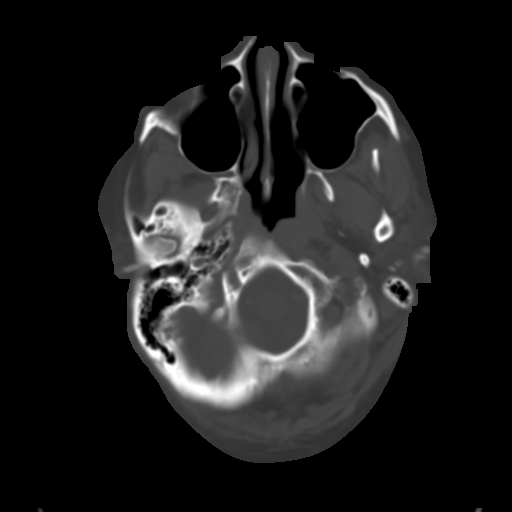
[im 9/34  brain]
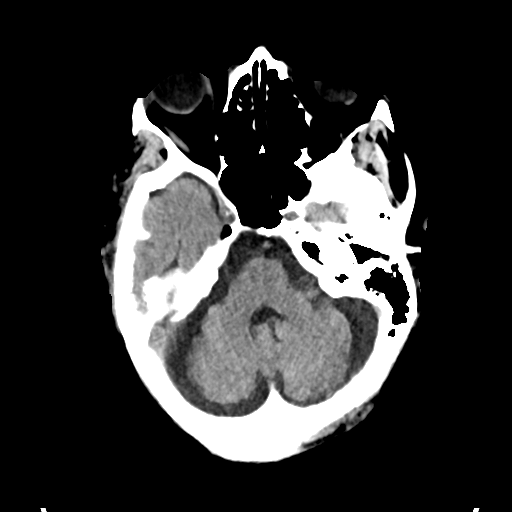
[im 13/34  brain]
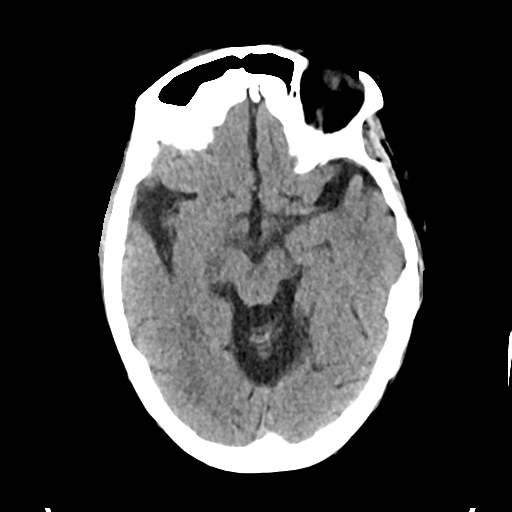
[im 17/34  brain]
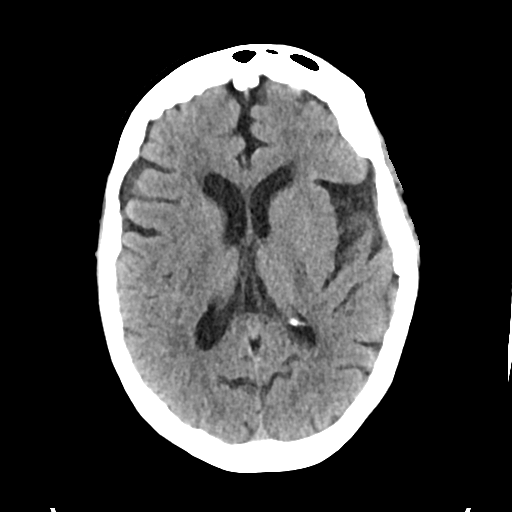
[im 21/34  brain]
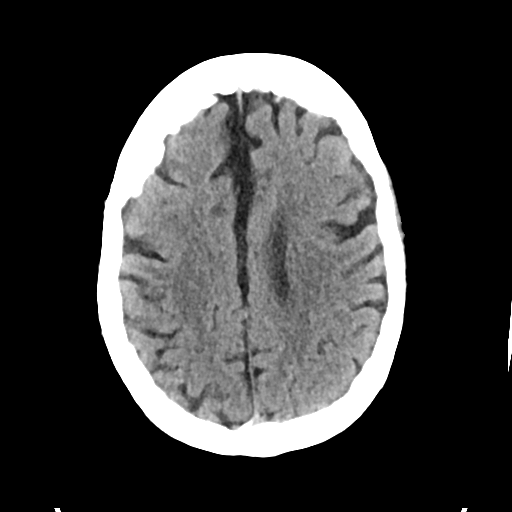
[im 21/34  bone]
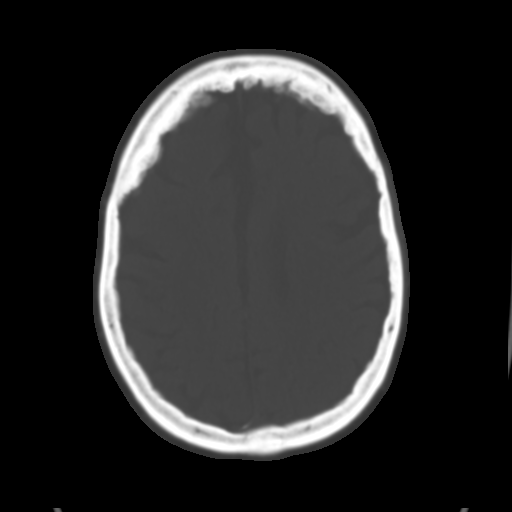
[im 25/34  brain]
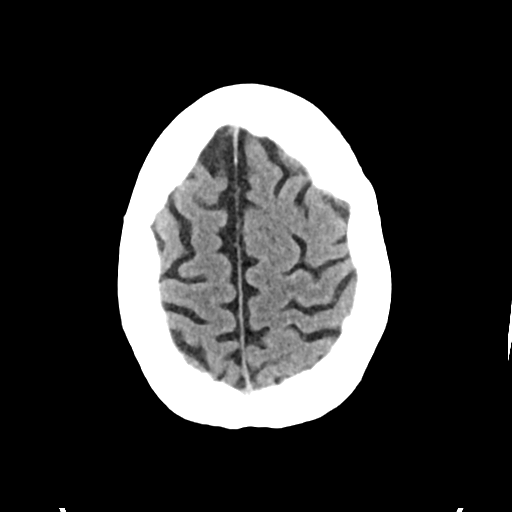
[im 29/34  brain]
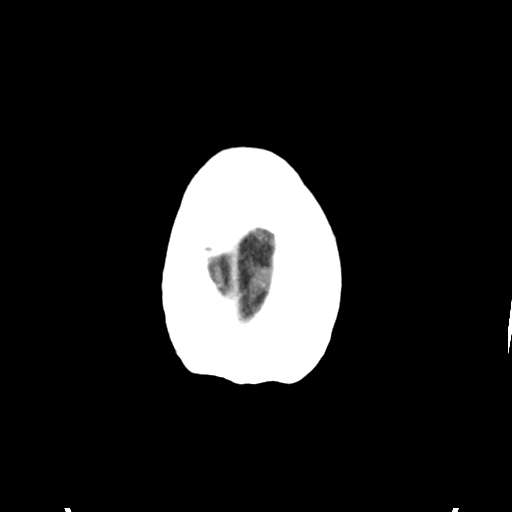

[Series 4: head bone · axial · 0.44mm/px · z∈[-150,-92]mm · 4 of 85 slices shown]
[im 9/85  bone]
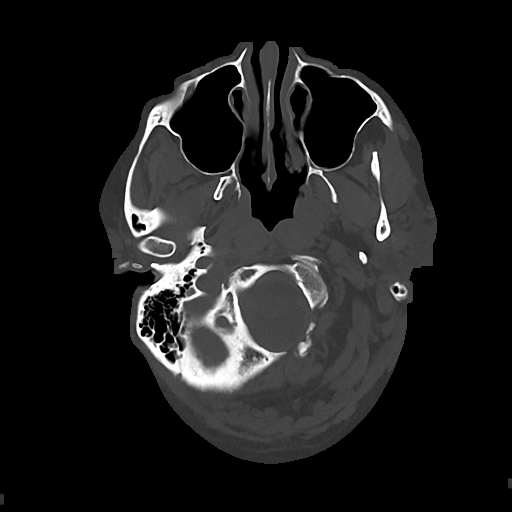
[im 17/85  bone]
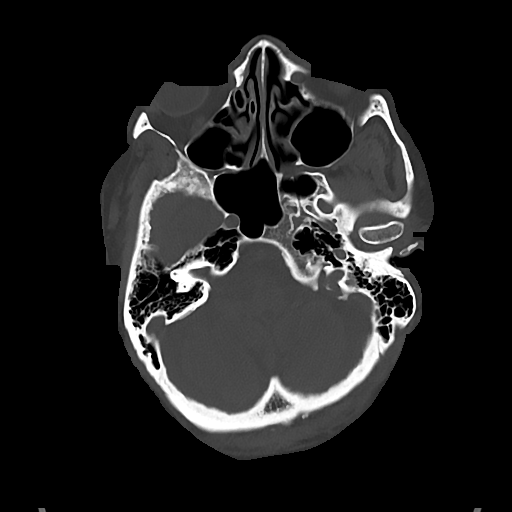
[im 26/85  bone]
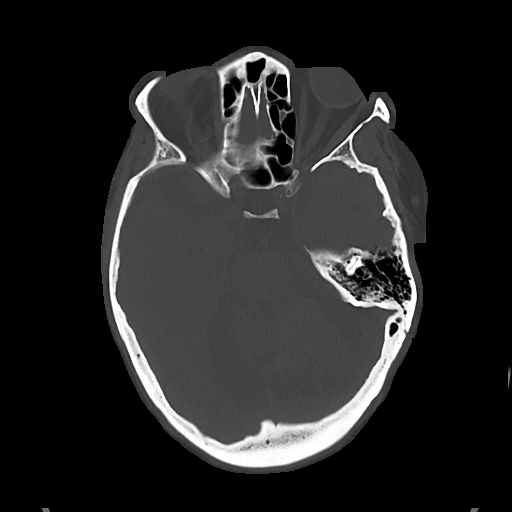
[im 38/85  bone]
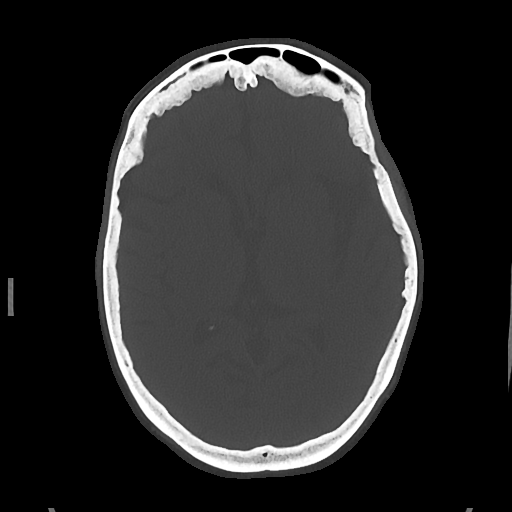

[Series 5: cor soft · coronal · 0.36mm/px · 3 of 73 slices shown]
[im 25/73  brain]
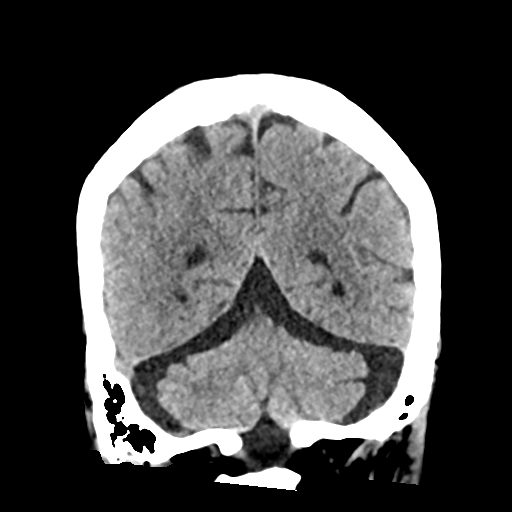
[im 33/73  brain]
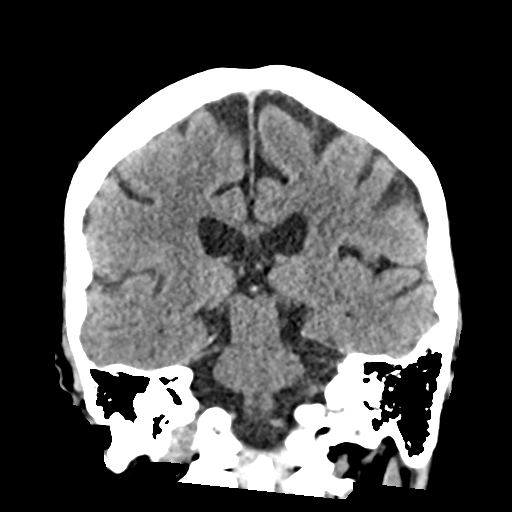
[im 41/73  brain]
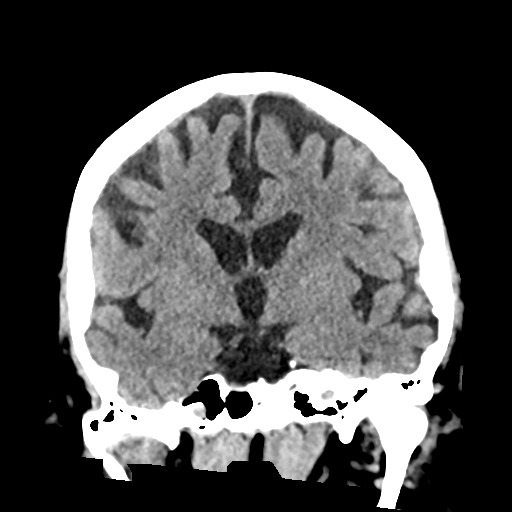

[Series 6: sag soft · sagittal · 0.36mm/px · 3 of 67 slices shown]
[im 23/67  brain]
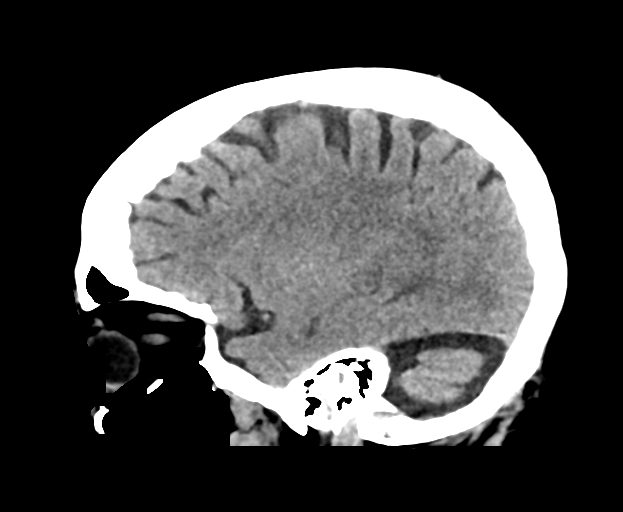
[im 34/67  brain]
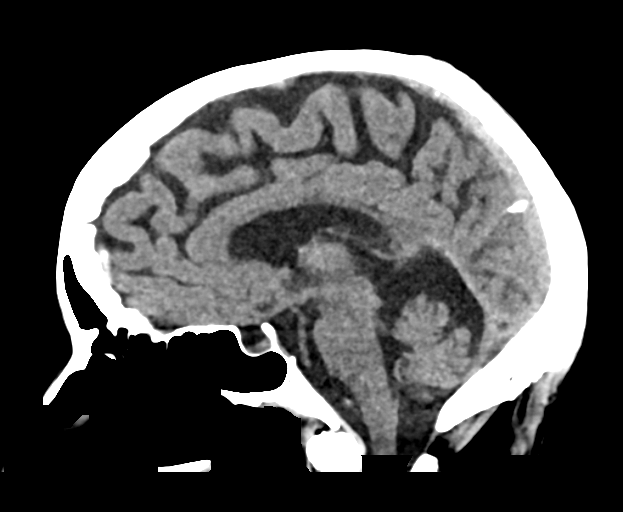
[im 44/67  brain]
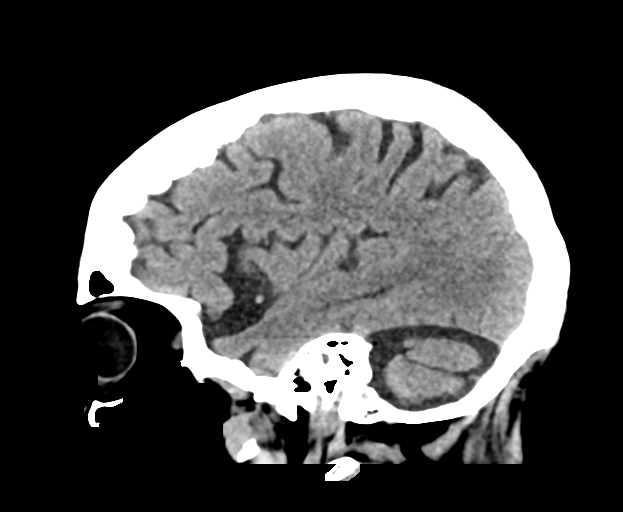

[17 of 47 positions shown; findings below may reference images not displayed]

FINDINGS: Brain: No evidence of acute infarction, hemorrhage, hydrocephalus,
extra-axial collection or mass lesion/mass effect. Stable mild
chronic microvascular ischemic changes of the brain and
supratentorial volume loss. Asymmetric advanced volume loss of the
cerebellum.

Vascular: Calcific atherosclerosis of carotid siphons. No hyperdense
vessel identified.

Skull: Normal. Negative for fracture or focal lesion.

Sinuses/Orbits: No acute finding.

Other: None.
IMPRESSION: 1. No acute intracranial abnormality identified.
2. Stable mild chronic microvascular ischemic changes of the brain
and supratentorial volume loss.
3. Asymmetric cerebellar volume loss. This may be due to alcoholic
cerebellar degeneration, phenytoin/lithium toxicity, sequelae of
cerebellitis, associated with an inherited ataxia syndrome, or
represent a neurodegenerate disorder such as multi-system atrophy.

## 2019-01-23 ENCOUNTER — Encounter: Payer: Self-pay | Admitting: Internal Medicine

## 2019-01-23 ENCOUNTER — Non-Acute Institutional Stay (SKILLED_NURSING_FACILITY): Payer: Medicare Other | Admitting: Internal Medicine

## 2019-01-23 DIAGNOSIS — E785 Hyperlipidemia, unspecified: Secondary | ICD-10-CM | POA: Diagnosis not present

## 2019-01-23 DIAGNOSIS — G8929 Other chronic pain: Secondary | ICD-10-CM

## 2019-01-23 DIAGNOSIS — I10 Essential (primary) hypertension: Secondary | ICD-10-CM

## 2019-01-23 DIAGNOSIS — E119 Type 2 diabetes mellitus without complications: Secondary | ICD-10-CM

## 2019-01-23 DIAGNOSIS — M545 Low back pain, unspecified: Secondary | ICD-10-CM

## 2019-01-23 DIAGNOSIS — D649 Anemia, unspecified: Secondary | ICD-10-CM

## 2019-01-23 DIAGNOSIS — Z7984 Long term (current) use of oral hypoglycemic drugs: Secondary | ICD-10-CM

## 2019-01-23 DIAGNOSIS — E559 Vitamin D deficiency, unspecified: Secondary | ICD-10-CM

## 2019-01-23 NOTE — Assessment & Plan Note (Signed)
Vitamin D level supratherapeutic on 11/27/2018; no change indicated

## 2019-01-23 NOTE — Assessment & Plan Note (Signed)
With a history of stroke LDL goal is less than 100, ideally less than 70.  On 10/21 it was 107.  Statin will not be initiated as A1c is prediabetic and she has a history of the intolerance to atorvastatin.

## 2019-01-23 NOTE — Assessment & Plan Note (Signed)
BP controlled; no change in antihypertensive medications  

## 2019-01-23 NOTE — Patient Instructions (Signed)
See assessment and plan under each diagnosis in the problem list and acutely for this visit 

## 2019-01-23 NOTE — Assessment & Plan Note (Signed)
Resolved on 11/27/2018 labs.  B12 level supratherapeutic on low-dose oral B12 supplement.

## 2019-01-23 NOTE — Progress Notes (Signed)
NURSING HOME LOCATION:  [facility] ROOM NUMBER:  226-A  CODE STATUS:  FULL CODE  PCP:  Hendricks Limes, MD  [place] [address]   This is a nursing facility follow up of chronic medical diagnoses.  Interim medical record and care since last [facility] visit was updated with review of diagnostic studies and change in clinical status since last visit were documented.  HPI: She is a permanent resident of the facility with medical diagnoses of history of breast cancer with ongoing maintenance therapy with Arimidex, vitamin D deficiency, bipolar disorder, diabetes with peripheral vascular/neurologic complications, essential hypertension, dyslipidemia, and history of congestive heart failure.   Despite the history of diabetes, fasting blood sugars have ranged from 85-105.  A1c was prediabetic at 5.9 on 10/21.  Also on that date vitamin D and vitamin B12 levels were supratherapeutic.  LDL was not at goal with a value of 107.  She is not on a statin as she has had an unspecified reaction to atorvastatin. Surgeries include left mastectomy with axillary sentinel node biopsy and cholecystectomy.  Review of systems: She was able to give the correct date.  She denied any active symptoms except for low back pain.  She states that it has been there "a long time" but could not quantitate the interval.  There was no associated injury.  States that the pain is constant and nonradiating.  She denies associated numbness or tingling.  She has no associated GI or GU symptoms or urinary or stool incontinence. Review of Epic revealed findings of degenerative disc disease on CT of the abdomen and pelvis in December 2019.  There was no bony destruction or evidence of metastatic disease. She has immediate release oxycodone 5 mg ordered every 4 hours as needed.  Constitutional: No fever, significant weight change, fatigue  Eyes: No redness, discharge, pain, vision change ENT/mouth: No  nasal congestion,  purulent discharge, earache, change in hearing, sore throat  Cardiovascular: No chest pain, palpitations, paroxysmal nocturnal dyspnea, claudication, edema  Respiratory: No cough, sputum production, hemoptysis, DOE, significant snoring, apnea   Gastrointestinal: No heartburn, dysphagia, abdominal pain, nausea /vomiting, rectal bleeding, melena, change in bowels Genitourinary: No dysuria, hematuria, pyuria, incontinence, nocturia Musculoskeletal: No joint stiffness, joint swelling Dermatologic: No rash, pruritus, change in appearance of skin Neurologic: No dizziness, headache, syncope, seizures, numbness, tingling Psychiatric: No significant anxiety, depression, insomnia, anorexia Endocrine: No change in hair/skin/nails, excessive thirst, excessive hunger, excessive urination  Hematologic/lymphatic: No significant bruising, lymphadenopathy, abnormal bleeding Allergy/immunology: No itchy/watery eyes, significant sneezing, urticaria, angioedema  Physical exam:  Pertinent or positive findings: It was after 4:00 and she was lying in bed.  She has a staccato, baby talk speech pattern.  Facies are masked.  She has facial hirsutism in the pattern of a goatee.  She is wearing only the upper plate.  Breath sounds are decreased over the lower chest.  Abdomen is protuberant.  There is minimal to no movement of the lower extremities.  The toes were upgoing when I did check pedal pulses.  Dorsalis pedis pulses were palpable but not the posterior tibial pulses.  The feet were markedly cool to touch without associated ischemic skin changes.  She was weak to opposition more so in the lower extremities than in the upper extremities. The left foot was inverted.  She did describe some low back discomfort with elevation of the right lower extremity to 30 degrees.  She did not have similar symptoms with the same  maneuver on the left.  With great difficulty she attempted to pull herself into the right  lateral decubitus position while I was pushing posteriorly. I could see no change in appearance of the skin over the lower back area.  General appearance: Adequately nourished; no acute distress, increased work of breathing is present.   Lymphatic: No lymphadenopathy about the head, neck, axilla. Eyes: No conjunctival inflammation or lid edema is present. There is no scleral icterus. Ears:  External ear exam shows no significant lesions or deformities.   Nose:  External nasal examination shows no deformity or inflammation. Nasal mucosa are pink and moist without lesions, exudates Oral exam:  Lips and gums are healthy appearing. There is no oropharyngeal erythema or exudate. Neck:  No thyromegaly, masses, tenderness noted.    Heart:  No gallop, murmur, click, rub .  Lungs:  without wheezes, rhonchi, rales, rubs. Abdomen: Bowel sounds are normal. Abdomen is soft and nontender with no organomegaly, hernias, masses. GU: Deferred  Extremities:  No cyanosis, clubbing, edema  Neurologic exam : Balance, Rhomberg, finger to nose testing could not be completed due to clinical state Skin: Warm & dry w/o tenting. No significant lesions or rash.  See summary under each active problem in the Problem List with associated updated therapeutic plan

## 2019-01-23 NOTE — Assessment & Plan Note (Signed)
Fasting glucoses have ranged from 85 up to 105.  On 11/27/2018 her A1c was 5.9%, prediabetic.  As renal function is normal; there is no contraindication to the low-dose prophylactic metformin

## 2019-02-19 ENCOUNTER — Encounter: Payer: Self-pay | Admitting: Internal Medicine

## 2019-02-19 LAB — HEPATIC FUNCTION PANEL
ALT: 23 (ref 7–35)
AST: 24 (ref 13–35)
Alkaline Phosphatase: 87 (ref 25–125)
Bilirubin, Total: 0.3

## 2019-02-19 LAB — COMPREHENSIVE METABOLIC PANEL
Albumin: 3.8 (ref 3.5–5.0)
Calcium: 10.1 (ref 8.7–10.7)
GFR calc Af Amer: 90
GFR calc non Af Amer: 85.82
Globulin: 3.2

## 2019-02-19 LAB — BASIC METABOLIC PANEL
BUN: 13 (ref 4–21)
CO2: 28 — AB (ref 13–22)
Chloride: 103 (ref 99–108)
Creatinine: 0.7 (ref 0.5–1.1)
Glucose: 139
Potassium: 6 — AB (ref 3.4–5.3)
Sodium: 144 (ref 137–147)

## 2019-02-19 LAB — IRON,TIBC AND FERRITIN PANEL
%SAT: 20.6
Ferritin: 232.6
Iron: 69
TIBC: 335
UIBC: 266

## 2019-02-19 LAB — CBC: RBC: 4.76 (ref 3.87–5.11)

## 2019-02-19 LAB — CBC AND DIFFERENTIAL
HCT: 45 (ref 36–46)
Hemoglobin: 14.7 (ref 12.0–16.0)
Neutrophils Absolute: 5
Platelets: 250 (ref 150–399)
WBC: 7.9

## 2019-02-19 LAB — LIPID PANEL
Cholesterol: 188 (ref 0–200)
HDL: 49 (ref 35–70)
LDL Cholesterol: 113
LDl/HDL Ratio: 3.9
Triglycerides: 133 (ref 40–160)

## 2019-02-19 LAB — VITAMIN B12: Vitamin B-12: 1867

## 2019-02-19 LAB — TSH: TSH: 4.6 (ref 0.41–5.90)

## 2019-02-19 LAB — HEMOGLOBIN A1C: Hemoglobin A1C: 5.6

## 2019-02-20 LAB — BASIC METABOLIC PANEL
BUN: 17 (ref 4–21)
CO2: 30 — AB (ref 13–22)
Chloride: 103 (ref 99–108)
Creatinine: 0.6 (ref 0.5–1.1)
Glucose: 93
Potassium: 4.1 (ref 3.4–5.3)
Sodium: 145 (ref 137–147)

## 2019-02-20 LAB — COMPREHENSIVE METABOLIC PANEL
Calcium: 9.9 (ref 8.7–10.7)
GFR calc Af Amer: 90
GFR calc non Af Amer: 87.15

## 2019-04-17 ENCOUNTER — Encounter: Payer: Self-pay | Admitting: Internal Medicine

## 2019-04-17 ENCOUNTER — Non-Acute Institutional Stay (SKILLED_NURSING_FACILITY): Payer: Medicare Other | Admitting: Internal Medicine

## 2019-04-17 DIAGNOSIS — I10 Essential (primary) hypertension: Secondary | ICD-10-CM | POA: Diagnosis not present

## 2019-04-17 DIAGNOSIS — C50212 Malignant neoplasm of upper-inner quadrant of left female breast: Secondary | ICD-10-CM | POA: Diagnosis not present

## 2019-04-17 DIAGNOSIS — F258 Other schizoaffective disorders: Secondary | ICD-10-CM

## 2019-04-17 DIAGNOSIS — E1151 Type 2 diabetes mellitus with diabetic peripheral angiopathy without gangrene: Secondary | ICD-10-CM

## 2019-04-17 DIAGNOSIS — Z170 Estrogen receptor positive status [ER+]: Secondary | ICD-10-CM

## 2019-04-17 DIAGNOSIS — I110 Hypertensive heart disease with heart failure: Secondary | ICD-10-CM | POA: Diagnosis not present

## 2019-04-17 DIAGNOSIS — E785 Hyperlipidemia, unspecified: Secondary | ICD-10-CM

## 2019-04-17 NOTE — Assessment & Plan Note (Addendum)
Update A1c as per [facility] NP Hyperglycemia not documented

## 2019-04-17 NOTE — Progress Notes (Signed)
NURSING HOME LOCATION:  [facility] ROOM NUMBER:  226-A  CODE STATUS:  Full Code  PCP:  Hendricks Limes, MD  [place] [address]    This is a nursing facility follow up of chronic medical diagnoses.  Interim medical record and care since last [facility] visit was updated with review of diagnostic studies and change in clinical status since last visit were documented.  HPI: She is a permanent resident facility with medical diagnoses of history of stroke, schizoaffective disorder, essential hypertension, dyslipidemia, diabetes with vascular complications, history of breast cancer, and history of congestive heart failure. Significant surgeries include cholecystectomy and mastectomy.  Review of systems: Complete review of systems was attempted; all responses were negative , spoken as a monosyllabic "no" except for back pain statement.  She could not quantitate it but stated that it was better when she was lying supine.  It was not associated with any radiation, numbness, or tingling.  There was also no stool or urinary incontinence.  When I asked why she was still in bed at 3:00; she made the comment "they get me up when they want".  [facility] NP is attempting to get her daughter to bring her electric wheelchair into the facility to enable mobilization.  When responses are more than the word "no", her speech pattern is that of "baby talk".  Constitutional: No fever, significant weight change, fatigue  Eyes: No redness, discharge, pain, vision change ENT/mouth: No nasal congestion,  purulent discharge, earache, change in hearing, sore throat  Cardiovascular: No chest pain, palpitations, paroxysmal nocturnal dyspnea, claudication, edema  Respiratory: No cough, sputum production, hemoptysis, DOE, significant snoring, apnea   Gastrointestinal: No heartburn, dysphagia, abdominal pain, nausea /vomiting, rectal bleeding, melena, change in bowels Genitourinary: No  dysuria, hematuria, pyuria, incontinence, nocturia Dermatologic: No rash, pruritus, change in appearance of skin Neurologic: No dizziness, headache, syncope, seizures, numbness, tingling Psychiatric: No significant anxiety, depression, insomnia, anorexia Endocrine: No change in hair/skin/nails, excessive thirst, excessive hunger, excessive urination  Hematologic/lymphatic: No significant bruising, lymphadenopathy, abnormal bleeding   Physical exam:  Pertinent or positive findings: Facies are blank and speech pattern is described above.  There is slight exotropia on the left.  Arcus senilis is present.  She is only wearing the upper denture.  It appears as if the upper lip and chin have been shaven.  Dorsalis pedis pulses are stronger than posterior tibial pulses.  She is weaker in the lower extremities than in the upper extremities.  With stimulation of the sole there is suggestion of an upgoing toe on the right.  With strength testing to opposition; she exhibited clonus of the right upper extremity.  General appearance: Adequately nourished; no acute distress, increased work of breathing is present.   Lymphatic: No lymphadenopathy about the head, neck, axilla. Eyes: No conjunctival inflammation or lid edema is present. There is no scleral icterus. Ears:  External ear exam shows no significant lesions or deformities.   Nose:  External nasal examination shows no deformity or inflammation. Nasal mucosa are pink and moist without lesions, exudates Oral exam:  Lips and gums are healthy appearing. There is no oropharyngeal erythema or exudate. Neck:  No thyromegaly, masses, tenderness noted.    Heart:  Normal rate and regular rhythm. S1 and S2 normal without gallop, murmur, click, rub .  Lungs: Chest clear to auscultation without wheezes, rhonchi, rales, rubs. Abdomen: Bowel sounds are normal. Abdomen is soft and nontender with no organomegaly, hernias, masses. GU: Deferred  Extremities:  No  cyanosis, clubbing, edema  Neurologic exam :Balance, Rhomberg, finger to nose testing could not be completed due to clinical state Skin: Warm & dry w/o tenting. No significant lesions or rash.  See summary under each active problem in the Problem List with associated updated therapeutic plan

## 2019-04-17 NOTE — Assessment & Plan Note (Signed)
Clinically there is no evidence of cardiac decompensation.  Specifically neck vein distention and peripheral edema absent.

## 2019-04-17 NOTE — Assessment & Plan Note (Signed)
No current behavioral issues described by staff

## 2019-04-17 NOTE — Assessment & Plan Note (Signed)
BP controlled; no change in antihypertensive medications  

## 2019-04-17 NOTE — Assessment & Plan Note (Signed)
11/27/2018 LDL 107 without statin therapy.

## 2019-04-17 NOTE — Patient Instructions (Signed)
See assessment and plan under each diagnosis in the problem list and acutely for this visit 

## 2019-04-17 NOTE — Assessment & Plan Note (Addendum)
[facility] NP performs breast exam annualy.Exam is current and negative.

## 2019-05-06 ENCOUNTER — Non-Acute Institutional Stay: Payer: Medicare Other | Admitting: Hospice

## 2019-05-06 ENCOUNTER — Other Ambulatory Visit: Payer: Self-pay

## 2019-05-06 DIAGNOSIS — Z515 Encounter for palliative care: Secondary | ICD-10-CM

## 2019-05-06 DIAGNOSIS — F319 Bipolar disorder, unspecified: Secondary | ICD-10-CM

## 2019-05-06 NOTE — Progress Notes (Signed)
[facility] Consult Note Telephone: [phone]  Fax: [phone]  PATIENT NAME: Carolyn Parrish DOB: Feb 13, 1940 MRN: [id number]  PRIMARY CARE PROVIDER:   Hendricks Limes, MD  REFERRING PROVIDER:  Hendricks Limes, MD [address] [place],  [place] [address]  RESPONSIBLE PARTY: Extended Emergency Contact Information Primary Emergency Contact: Aydia, Kubly Mobile Phone: [phone] Relation: Daughter      RECOMMENDATIONS/PLAN:   Advance Care Planning/Goals of Care:  Visit consisted of building trust and discussions on Palliative Medicine as specialized medical care for people living with serious illness, aimed at facilitating better quality of life through symptoms relief, assisting with advance care plan and establishing goals of care. Patient is a full code. Goals of care include to maximize quality of life and symptom management. Carolyn Parrish present during visit; discussed and signed MOST form today after answering all her questions. Selections include CPR, IV fluid/antibiotics as indicated, limited additional intervention, no feeding tube. Informed nursing staff of MOST; form placed in unit code status book and uploaded in [facility].  Symptom management : Patient in no acute distress, pleasant; denies pain/discomfort, happy with family visiting today. She continues on Olanzapine for bipolar disorder/schizophrenia, effective.  Patient finished radiation treatment for breast cancer and continues on Arimidex  (Anastrazole). Constipation well managed with stool softner. She is bedbound secondary to CVA, hoyer lift for all transfers, able to feed self after set up, max assist in ADLs. Nursing staff with no complaints;reports patient has been fairly stable. Encouraged ongoing care.  Follow up: Palliative care will continue to follow patient for goals of care clarification and symptom management. I spent 76 minutes providing this consultation; time  includes chart review and documentation. More than 50% of the time in this consultation was spent on coordinating communication   HISTORY OF PRESENT ILLNESS:  Carolyn Parrish is a 79 y.o. year old female with multiple medical problems including breast cancer, CAD, DM2, schizophrenia CVA. Palliative Care was asked to help address goals of care.   CODE STATUS: Full  PPS: 30% HOSPICE ELIGIBILITY/DIAGNOSIS: TBD  PAST MEDICAL HISTORY:  Past Medical History:  Diagnosis Date  . Cancer (HCC)    BREAST CANCER  . CHF (congestive heart failure) ([facility])   . Diabetes mellitus without complication ([facility])   . Hyperlipidemia   . Hypertension   . Schizoaffective disorder ([facility])   . Stroke [facility])     SOCIAL HX:  Social History   Tobacco Use  . Smoking status: Never Smoker  . Smokeless tobacco: Never Used  Substance Use Topics  . Alcohol use: Not Currently    ALLERGIES:  Allergies  Allergen Reactions  . Atorvastatin     UNSPECIFIED REACTION      PERTINENT MEDICATIONS:  Outpatient Encounter Medications as of 05/06/2019  Medication Sig  . acetaminophen (TYLENOL) 500 MG tablet Take 500 mg by mouth every 4 (four) hours as needed.  Marland Kitchen alum & mag hydroxide-simeth (MYLANTA) 200-200-20 MG/5ML suspension Take 30 mLs by mouth every 4 (four) hours as needed for indigestion or heartburn.  . anastrozole (ARIMIDEX) 1 MG tablet Take 1 tablet (1 mg total) by mouth daily.  Marland Kitchen aspirin EC 81 MG tablet Take 81 mg by mouth daily.  . bisacodyl (DULCOLAX) 10 MG suppository Place 10 mg rectally as needed for moderate constipation.  . camphor-menthol (SARNA) lotion Apply 1 application topically 2 (two) times daily as needed for itching.  . Cholecalciferol (VITAMIN D3) 25 MCG (1000 UT) CAPS Take 1  capsule by mouth daily.  . divalproex (DEPAKOTE ER) 500 MG 24 hr tablet Take 500 mg by mouth daily.   Marland Kitchen docusate sodium (COLACE) 100 MG capsule Take 100 mg by mouth 2 (two) times daily. For constipation  . magnesium  hydroxide (MILK OF MAGNESIA) 400 MG/5ML suspension Take 30 mLs by mouth daily as needed for mild constipation.  . metFORMIN (GLUCOPHAGE) 500 MG tablet Take 500 mg by mouth daily.   Marland Kitchen OLANZapine (ZYPREXA) 5 MG tablet Take 1 tablet (5 mg total) by mouth at bedtime.  Marland Kitchen oxycodone (OXY-IR) 5 MG capsule Take 5 mg by mouth every 4 (four) hours as needed for pain.  . polyethylene glycol (MIRALAX / GLYCOLAX) packet Take 17 g by mouth daily.  . sennosides-docusate sodium (SENOKOT-S) 8.6-50 MG tablet Take 1 tablet by mouth at bedtime.  . Sodium Phosphates (RA SALINE ENEMA) 19-7 GM/118ML ENEM Place 1 each rectally as needed (for constipation).  . vitamin B-12 (CYANOCOBALAMIN) 500 MCG tablet Take 500 mcg by mouth daily.   No facility-administered encounter medications on file as of 05/06/2019.    PHYSICAL EXAM:   General: NAD, cooperative Cardiovascular: regular rate and rhythm Pulmonary: clear ant fields; normal respiratory effort, 02 97% RA Abdomen: soft, nontender, + bowel sounds GU: no suprapubic tenderness Extremities: no edema, no joint deformities Skin: no rashes to exposed skin Neurological: Weakness but otherwise nonfocal  Teodoro Spray, NP

## 2019-05-29 LAB — HM DIABETES EYE EXAM

## 2019-07-15 ENCOUNTER — Other Ambulatory Visit: Payer: Self-pay

## 2019-07-15 ENCOUNTER — Non-Acute Institutional Stay: Payer: Medicare Other | Admitting: Hospice

## 2019-07-15 DIAGNOSIS — F319 Bipolar disorder, unspecified: Secondary | ICD-10-CM

## 2019-07-15 DIAGNOSIS — Z515 Encounter for palliative care: Secondary | ICD-10-CM

## 2019-07-15 NOTE — Progress Notes (Signed)
[facility] Consult Note Telephone: [phone]  Fax: [phone]  PATIENT NAME: Carolyn Parrish DOB: 20-Jul-1940 MRN: [id number]  PRIMARY CARE PROVIDER:   Hendricks Limes, MD  REFERRING PROVIDER:  Hendricks Limes, MD [address] [place],  [place] [address]  RESPONSIBLE PARTY:Extended Emergency Contact Information Primary Emergency Contact: Kirah, Stice Mobile Phone: [phone] Relation: Daughter    RECOMMENDATIONS/PLAN:   Advance Care Planning/Goals of Care:  Visit consisted of building trust and and follow-up with palliative care.  Visit consisted of counseling and education dealing with the complex and emotionally intense issues of symptom management and palliative care in the setting of serious and potentially life-threatening illness.  Patient is noted with limited communication speaking mostly in monosyllables.  NP called and spoke with [name] who also expressed appreciation for update. Patient is a full code. Goals of care include to maximize quality of life and symptom management.  MOST form selections signed previously with Vernnessa include  CPR, IV fluid/antibiotics as indicated, limited additional intervention, no feeding tube.  Chart review indicated patient's daughter was supposed to bring her an IT trainer wheelchair.  NP discussed with daughter-Vernnessa-the provision of electric wheelchair for patient.  She said she offered but patient said she did not need it and she had to respect her wish.  Therapeutic listening and validation provided. Symptom management : Patient in no acute distress,  FLACC 0, denies pain/discomfort,She is bedbound secondary to CVA, hoyer lift for all transfers, able to feed self after set up, max assist in ADLs. She continues on Olanzapine, Deparkote for bipolar disorder/schizophrenia, effective.  Patient finished radiation treatment for breast cancer and continues on Arimidex   (Anastrazole). Constipation well managed with stool softner.  Nursing staff with no complaints- reports patient has been fairly stable. Encouraged ongoing care.  Follow up: Palliative care will continue to follow patient for goals of care clarification and symptom management. I spent  48 minutes providing this consultation; time includes chart review and documentation. More than 50% of the time in this consultation was spent on coordinating communication   HISTORY OF PRESENT ILLNESS:Carolyn D Greenleeis a 79 y.o.year oldfemalewith multiple medical problems including breast cancer, CAD, DM2, schizophrenia CVA. Palliative Care was asked to help address goals of care.  CODE STATUS: Full  PPS: 30% HOSPICE ELIGIBILITY/DIAGNOSIS: TBD  PAST MEDICAL HISTORY:  Past Medical History:  Diagnosis Date  . Cancer (HCC)    BREAST CANCER  . CHF (congestive heart failure) ([facility])   . Diabetes mellitus without complication ([facility])   . Hyperlipidemia   . Hypertension   . Schizoaffective disorder ([facility])   . Stroke [facility])     SOCIAL HX:  Social History   Tobacco Use  . Smoking status: Never Smoker  . Smokeless tobacco: Never Used  Substance Use Topics  . Alcohol use: Not Currently    ALLERGIES:  Allergies  Allergen Reactions  . Atorvastatin     UNSPECIFIED REACTION      PERTINENT MEDICATIONS:  Outpatient Encounter Medications as of 07/15/2019  Medication Sig  . acetaminophen (TYLENOL) 500 MG tablet Take 500 mg by mouth every 4 (four) hours as needed.  Marland Kitchen alum & mag hydroxide-simeth (MYLANTA) 200-200-20 MG/5ML suspension Take 30 mLs by mouth every 4 (four) hours as needed for indigestion or heartburn.  . anastrozole (ARIMIDEX) 1 MG tablet Take 1 tablet (1 mg total) by mouth daily.  Marland Kitchen aspirin EC 81 MG tablet Take 81 mg by mouth daily.  . bisacodyl (  DULCOLAX) 10 MG suppository Place 10 mg rectally as needed for moderate constipation.  . camphor-menthol (SARNA) lotion Apply 1 application  topically 2 (two) times daily as needed for itching.  . Cholecalciferol (VITAMIN D3) 25 MCG (1000 UT) CAPS Take 1 capsule by mouth daily.  . divalproex (DEPAKOTE ER) 500 MG 24 hr tablet Take 500 mg by mouth daily.   Marland Kitchen docusate sodium (COLACE) 100 MG capsule Take 100 mg by mouth 2 (two) times daily. For constipation  . magnesium hydroxide (MILK OF MAGNESIA) 400 MG/5ML suspension Take 30 mLs by mouth daily as needed for mild constipation.  . metFORMIN (GLUCOPHAGE) 500 MG tablet Take 500 mg by mouth daily.   Marland Kitchen OLANZapine (ZYPREXA) 5 MG tablet Take 1 tablet (5 mg total) by mouth at bedtime.  Marland Kitchen oxycodone (OXY-IR) 5 MG capsule Take 5 mg by mouth every 4 (four) hours as needed for pain.  . polyethylene glycol (MIRALAX / GLYCOLAX) packet Take 17 g by mouth daily.  . sennosides-docusate sodium (SENOKOT-S) 8.6-50 MG tablet Take 1 tablet by mouth at bedtime.  . Sodium Phosphates (RA SALINE ENEMA) 19-7 GM/118ML ENEM Place 1 each rectally as needed (for constipation).  . vitamin B-12 (CYANOCOBALAMIN) 500 MCG tablet Take 500 mcg by mouth daily.   No facility-administered encounter medications on file as of 07/15/2019.    PHYSICAL EXAM/ROS:  General: NAD, cooperative Cardiovascular: regular rate and rhythm; denies chest pain Pulmonary: clear ant fields; normal respiratory effort, 02 97% RA Abdomen: soft, nontender, + bowel sounds GU: no suprapubic tenderness Extremities: no edema, no joint deformities Skin: no rashes to exposed skin Neurological: Weakness but otherwise nonfocal  Teodoro Spray, NP

## 2019-07-22 ENCOUNTER — Encounter: Payer: Self-pay | Admitting: Internal Medicine

## 2019-07-22 ENCOUNTER — Non-Acute Institutional Stay (SKILLED_NURSING_FACILITY): Payer: Medicare Other | Admitting: Internal Medicine

## 2019-07-22 DIAGNOSIS — N179 Acute kidney failure, unspecified: Secondary | ICD-10-CM

## 2019-07-22 DIAGNOSIS — D649 Anemia, unspecified: Secondary | ICD-10-CM

## 2019-07-22 DIAGNOSIS — I10 Essential (primary) hypertension: Secondary | ICD-10-CM | POA: Diagnosis not present

## 2019-07-22 DIAGNOSIS — F258 Other schizoaffective disorders: Secondary | ICD-10-CM

## 2019-07-22 NOTE — Patient Instructions (Signed)
See assessment and plan under each diagnosis in the problem list and acutely for this visit 

## 2019-07-22 NOTE — Progress Notes (Signed)
NURSING HOME LOCATION:  [facility] ROOM NUMBER:  226-A  CODE STATUS:  Full Code  PCP:  Hendricks Limes, MD  [place] [address]   This is a nursing facility follow up of chronic medical diagnoses.  Interim medical record and care since last [place] visit was updated with review of diagnostic studies and change in clinical status since last visit were documented.  HPI: She is a permanent resident of the facility with diagnoses of history of breast cancer, history of stroke, schizoaffective disorder, essential hypertension, dyslipidemia, diabetes with neurologic complications, and history of congestive heart failure. She has had left mastectomy and remains on Arimidex. Palliative care NP saw the patient on 6/8; the patient remains a full code despite her bedbound state related to previous stroke requiring a Hoyer lift for transport.  That note was reviewed.  Review of systems: She denied any active symptoms and  replies were a monosyllabic "no" to all queries.  Constitutional: No fever, significant weight change, fatigue  Eyes: No redness, discharge, pain, vision change ENT/mouth: No nasal congestion,  purulent discharge, earache, change in hearing, sore throat  Cardiovascular: No chest pain, palpitations, paroxysmal nocturnal dyspnea, claudication, edema  Respiratory: No cough, sputum production, hemoptysis, DOE, significant snoring, apnea   Gastrointestinal: No heartburn, dysphagia, abdominal pain, nausea /vomiting, rectal bleeding, melena, change in bowels Genitourinary: No dysuria, hematuria, pyuria, incontinence, nocturia Musculoskeletal: No joint stiffness, joint swelling, weakness, pain Dermatologic: No rash, pruritus, change in appearance of skin Neurologic: No dizziness, headache, syncope, seizures, numbness, tingling Psychiatric: No significant anxiety, depression, insomnia, anorexia Endocrine: No change in hair/skin/nails, excessive  thirst, excessive hunger, excessive urination  Hematologic/lymphatic: No significant bruising, lymphadenopathy, abnormal bleeding Allergy/immunology: No itchy/watery eyes, significant sneezing, urticaria, angioedema  Physical exam:  Pertinent or positive findings: As noted replies are monosyllabic and almost a "baby talk".  Facies are blank.  She has frontal pattern baldness.  There is hirsutism in ad goatee distribution around the mouth and chin.  She is wearing only the upper plate.  There is a slight S4 gallop.  Breath sounds are decreased inferiorly.  Dorsalis pedis pulses are stronger than posterior tibial pulses.  There appears to be some involuntary propulsive response with opposition to strength testing in the lower extremities.  There is some asymmetry in the in the weakness in the extremities but it is subtle and hard to replicate.  She exhibits an intermittent rocking tremor or rotating medial to laterally of the right upper extremity.  General appearance: Adequately nourished; no acute distress, increased work of breathing is present.   Lymphatic: No lymphadenopathy about the head, neck, axilla. Eyes: No conjunctival inflammation or lid edema is present. There is no scleral icterus. Ears:  External ear exam shows no significant lesions or deformities.   Nose:  External nasal examination shows no deformity or inflammation. Nasal mucosa are pink and moist without lesions, exudates Oral exam:  Lips and gums are healthy appearing. There is no oropharyngeal erythema or exudate. Neck:  No thyromegaly, masses, tenderness noted.    Heart:  No murmur, click, rub .  Lungs:  without wheezes, rhonchi, rales, rubs. Abdomen: Bowel sounds are normal. Abdomen is soft and nontender with no organomegaly, hernias, masses. GU: Deferred  Extremities:  No cyanosis, clubbing, edema  Neurologic exam :Balance, Rhomberg, finger to nose testing could not be completed due to clinical state Skin: Warm & dry w/o  tenting. No significant lesions or rash.  See summary under each  active problem in the Problem List with associated updated therapeutic plan

## 2019-07-22 NOTE — Assessment & Plan Note (Signed)
Repeat CBC in January revealed no anemia.  No bleeding dyscrasias reported

## 2019-07-22 NOTE — Assessment & Plan Note (Signed)
Renal function remains within normal limits with creatinine of 0.6 and GFR of 90 in January of this year.

## 2019-07-22 NOTE — Assessment & Plan Note (Signed)
BP controlled; no change in antihypertensive medications  

## 2019-07-22 NOTE — Assessment & Plan Note (Addendum)
[facility] NP reports that she tends to remain in bed most of the time.  She declined to have her husband's power chair brought to the facility for her use to allow her to be out of bed and increase socialization. Today she is somewhat distant , answering in monosyllables without clarification or elaboration as to limit conversation. Discuss Psych NP follow-up with [facility] NP.

## 2019-10-01 LAB — LIPID PANEL
Cholesterol: 201 — AB (ref 0–200)
HDL: 60 (ref 35–70)
LDL Cholesterol: 122
LDl/HDL Ratio: 3.3
Triglycerides: 92 (ref 40–160)

## 2019-10-01 LAB — BASIC METABOLIC PANEL
BUN: 11 (ref 4–21)
CO2: 26 — AB (ref 13–22)
Chloride: 105 (ref 99–108)
Creatinine: 0.6 (ref 0.5–1.1)
Glucose: 114
Potassium: 4.5 (ref 3.4–5.3)
Sodium: 144 (ref 137–147)

## 2019-10-01 LAB — HEPATIC FUNCTION PANEL
ALT: 17 (ref 7–35)
AST: 19 (ref 13–35)
Alkaline Phosphatase: 123 (ref 25–125)
Bilirubin, Total: 0.3

## 2019-10-01 LAB — COMPREHENSIVE METABOLIC PANEL
Albumin: 3.9 (ref 3.5–5.0)
Calcium: 10.4 (ref 8.7–10.7)
GFR calc Af Amer: 90
GFR calc non Af Amer: 87.7
Globulin: 3.2

## 2019-10-01 LAB — VITAMIN D 25 HYDROXY (VIT D DEFICIENCY, FRACTURES): Vit D, 25-Hydroxy: 58.4

## 2019-10-01 LAB — CBC: RBC: 4.79 (ref 3.87–5.11)

## 2019-10-01 LAB — CBC AND DIFFERENTIAL
HCT: 46 (ref 36–46)
Hemoglobin: 14.7 (ref 12.0–16.0)
Neutrophils Absolute: 4
Platelets: 207 (ref 150–399)
WBC: 7.3

## 2019-10-01 LAB — HEMOGLOBIN A1C: Hemoglobin A1C: 5.6

## 2019-10-01 LAB — TSH: TSH: 2.39 (ref 0.41–5.90)

## 2019-10-03 ENCOUNTER — Other Ambulatory Visit: Payer: Self-pay

## 2019-10-03 ENCOUNTER — Non-Acute Institutional Stay: Payer: Medicare Other | Admitting: Hospice

## 2019-10-03 DIAGNOSIS — F319 Bipolar disorder, unspecified: Secondary | ICD-10-CM

## 2019-10-03 DIAGNOSIS — C50912 Malignant neoplasm of unspecified site of left female breast: Secondary | ICD-10-CM

## 2019-10-03 DIAGNOSIS — Z515 Encounter for palliative care: Secondary | ICD-10-CM

## 2019-10-03 NOTE — Progress Notes (Addendum)
[facility] Consult Note Telephone: [phone]  Fax: [phone]  PATIENT NAME: Carolyn Parrish DOB: April 02, 1940 MRN: [id number] PRIMARY CARE PROVIDER:Hopper, Darrick Penna, MD  REFERRING PROVIDER:Hopper, Darrick Penna, MD [address] [place],  [address]  RESPONSIBLE PARTY:Extended Emergency Contact Information Primary Emergency Contact: Julann, [place] Mobile Phone: [phone] Relation: Daughter    RECOMMENDATIONS/PLAN:  Advance Care Planning/Goals of Care: Visit consisted of building trust and and follow-up with palliative care.  Patient with limited speech/communication.  NP called and spoke with daughter [name], updating her on visit.  Extensive discussion with [name] on patient's health status and goals of care clarification.  Education provided that a change from full CODE STATUS to DO NOT RESUSCITATE does not mean that patient will not be receiving care from her primary care provider or from specialists where applicable.  [name] verbalized understanding that a DO NOT RESUSCITATE only applies when patient's heart no longer beats and patient no longer breathing.  She said she plans to go to the facility and have a face-to-face talk with patient and determine for sure what she wants at this time.  She shared patient's experience in the past when she suffered stroke, was resuscitated, placed on mechanical ventilation for a while; she believes that that experience is why patient continues to desire to be a full code.  [name], however, understands that patient's health condition and quality of life have significantly declined since then.  Therapeutic listening and ample emotional support provided.  For now, patient remains a FULL CODE.   GOALS OF CARE: Goals of care include to maximize quality of life and symptom management.  MOST form selections signed previously with [place] include  CPR, IV fluid/antibiotics as  indicated, limited additional intervention, no feeding tube.   Symptom management :Patient is stable in her chronic disease conditions; patient in no acute distress,  FLACC 0, denies pain/discomfort.  No report of fall or hospitalizations since last visit.  No change in plan of care.  She is bedbound secondary to CVA, continues to require hoyer lift for all transfers, able to feed self after set up, max assist in ADLs.   Patient is noted with limited communication speaking mostly in monosyllables. She continues on Olanzapine, Deparkote for bipolar disorder/schizophrenia, effective. Patient finished radiation treatment for breast cancer and continues on Arimidex(Anastrazole). Constipation well managed with stool softner.  Nursing staff with no complaints. Encouraged ongoing care.  Follow HG:[id number] care will continue to follow patient for goals of care clarification and symptom management. I spentand 16 minutes providing this consultation; time includes spent with patient/family, chart review and documentation. More than 50% of the time in this consultation was spent on coordinating communication   HISTORY OF PRESENT ILLNESS:Laraine D Greenleeis a 79 y.o.year oldfemalewith multiple medical problems including breast cancer, CAD, DM2, schizophreniaCVA. Palliative Care was asked to help address goals of care.  CODE STATUS:Full  PPS:30% HOSPICE ELIGIBILITY/DIAGNOSIS: TBD  PAST MEDICAL HISTORY:  Past Medical History:  Diagnosis Date  . Cancer (HCC)    BREAST CANCER  . CHF (congestive heart failure) ([place])   . Diabetes mellitus without complication ([place])   . Hyperlipidemia   . Hypertension   . Schizoaffective disorder ([place])   . Stroke [facility])     SOCIAL HX:  Social History   Tobacco Use  . Smoking status: Never Smoker  . Smokeless tobacco: Never Used  Substance Use Topics  . Alcohol use: Not Currently    ALLERGIES:  Allergies  Allergen  Reactions  . Atorvastatin      UNSPECIFIED REACTION      PERTINENT MEDICATIONS:  Outpatient Encounter Medications as of 10/03/1975  Medication Sig  . acetaminophen (TYLENOL) 500 MG tablet Take 500 mg by mouth every 4 (four) hours as needed.  Marland Kitchen alum & mag hydroxide-simeth (MYLANTA) 200-200-20 MG/5ML suspension Take 30 mLs by mouth every 4 (four) hours as needed for indigestion or heartburn.  . anastrozole (ARIMIDEX) 1 MG tablet Take 1 tablet (1 mg total) by mouth daily.  Marland Kitchen aspirin EC 81 MG tablet Take 81 mg by mouth daily.  . bisacodyl (DULCOLAX) 10 MG suppository Place 10 mg rectally as needed for moderate constipation.  . camphor-menthol (SARNA) lotion Apply 1 application topically 2 (two) times daily as needed for itching.  . Cholecalciferol (VITAMIN D3) 25 MCG (1000 UT) CAPS Take 1 capsule by mouth daily.  . divalproex (DEPAKOTE ER) 500 MG 24 hr tablet Take 500 mg by mouth daily.   Marland Kitchen docusate sodium (COLACE) 100 MG capsule Take 100 mg by mouth 2 (two) times daily. For constipation  . magnesium hydroxide (MILK OF MAGNESIA) 400 MG/5ML suspension Take 30 mLs by mouth daily as needed for mild constipation.  . metFORMIN (GLUCOPHAGE) 500 MG tablet Take 500 mg by mouth daily.   Marland Kitchen OLANZapine (ZYPREXA) 5 MG tablet Take 1 tablet (5 mg total) by mouth at bedtime.  Marland Kitchen oxycodone (OXY-IR) 5 MG capsule Take 5 mg by mouth every 4 (four) hours as needed for pain.  . polyethylene glycol (MIRALAX / GLYCOLAX) packet Take 17 g by mouth daily.  . sennosides-docusate sodium (SENOKOT-S) 8.6-50 MG tablet Take 1 tablet by mouth at bedtime.  . Sodium Phosphates (RA SALINE ENEMA) 19-7 GM/118ML ENEM Place 1 each rectally as needed (for constipation).  . vitamin B-12 (CYANOCOBALAMIN) 500 MCG tablet Take 500 mcg by mouth daily.   No facility-administered encounter medications on file as of 10/03/1975.    PHYSICAL EXAM/ROS:  General: NAD,flat affect, cooperative Cardiovascular: regular rate and rhythm; denies chest pain Pulmonary: clear ant  fields; normal respiratory effort; no coughing, no shortness of breath Abdomen: soft, nontender, + bowel sounds GU: no suprapubic tenderness Extremities: no edema, no joint deformities Skin: no rashesto exposed skin Neurological: Weakness but otherwise nonfocal  Teodoro Spray, NP

## 2019-10-14 ENCOUNTER — Non-Acute Institutional Stay (SKILLED_NURSING_FACILITY): Payer: Medicare Other | Admitting: Internal Medicine

## 2019-10-14 ENCOUNTER — Encounter: Payer: Self-pay | Admitting: Internal Medicine

## 2019-10-14 DIAGNOSIS — I10 Essential (primary) hypertension: Secondary | ICD-10-CM

## 2019-10-14 DIAGNOSIS — E1151 Type 2 diabetes mellitus with diabetic peripheral angiopathy without gangrene: Secondary | ICD-10-CM | POA: Diagnosis not present

## 2019-10-14 DIAGNOSIS — E785 Hyperlipidemia, unspecified: Secondary | ICD-10-CM | POA: Diagnosis not present

## 2019-10-14 DIAGNOSIS — D649 Anemia, unspecified: Secondary | ICD-10-CM | POA: Diagnosis not present

## 2019-10-14 NOTE — Patient Instructions (Signed)
See assessment and plan under each diagnosis in the problem list and acutely for this visit 

## 2019-10-14 NOTE — Assessment & Plan Note (Signed)
With history of stroke LDL goal would be less than 70; but she has a history of intolerance or allergy to atorvastatin.

## 2019-10-14 NOTE — Assessment & Plan Note (Signed)
BP controlled; no change in antihypertensive medications  

## 2019-10-14 NOTE — Progress Notes (Signed)
° °  NURSING HOME LOCATION:  [facility] ROOM NUMBER:  226-A  CODE STATUS:  FULL CODE  PCP:  Hendricks Limes, MD  [place] [address]   This is a nursing facility follow up of chronic medical diagnoses.   Interim medical record and care since last [facility] visit was updated with review of diagnostic studies and change in clinical status since last visit were documented.  HPI: She is a permanent resident of facility with diagnoses of history of stroke, schizophrenic disorder, essential hypertension, dyslipidemia, diabetes with neurologic complication, history of congestive heart failure, and history of breast cancer.  She remains on Arimidex.   Surgeries include left mastectomy with sentinel x-ray node biopsy as well as cholecystectomy.  Labs are current and amazingly good.  HDL was 60; LDL is 122.  She is not on a statin as she has a history of intolerance or allergy to atorvastatin.  She remains on low-dose Metformin because of dietary non compliance.  [facility] NP states the patient will eat sweets all day long if she has access to such.  A1c was prediabetic at 5.6% on 10/01/2019.  [facility] NP states that the patient is vacillating as to [place].  This will be clarified at the next care plan meeting with her daughter.  Review of systems: Speech was indiscernible baby talk.  She did not respond to direct queries;but she did follow simple commands as when asked to raise her extremities against resistance.  Physical exam:  Pertinent or positive findings: Facies are blank.  She tends to have a wide-eyed stare when asked a question.  Slight exotropia was noted on the left.  She has alopecia over the anterior crown.  There is marked hirsutism around the upper lip and chin.  She exhibited repetitive mastication type maneuvers of her mandible.  She was wearing only the upper plate.  Minor rales were noted on the left.  Pedal pulses are decreased.  She has trace-1/2+  edema.  She is weak to opposition in all extremities, more so in LEs.  The right great toe appeared to be upgoing when the foot was stimulated.  The only voluntary activity was use of the left upper extremity as she fed herself.  Movements were somewhat athetoid.  General appearance: Adequately nourished; no acute distress, increased work of breathing is present.   Lymphatic: No lymphadenopathy about the head, neck, axilla. Eyes: No conjunctival inflammation or lid edema is present. There is no scleral icterus. Ears:  External ear exam shows no significant lesions or deformities.   Nose:  External nasal examination shows no deformity or inflammation. Nasal mucosa are pink and moist without lesions, exudates Oral exam:  Lips and gums are healthy appearing. Neck:  No thyromegaly, masses, tenderness noted.    Heart:  Normal rate and regular rhythm. S1 and S2 normal without gallop, murmur, click, rub .  Lungs:  without wheezes, rhonchi,  rubs. Abdomen: Bowel sounds are normal. Abdomen is soft and nontender with no organomegaly, hernias, masses. GU: Deferred  Extremities:  No cyanosis, clubbing  Neurologic exam :Balance, Rhomberg, finger to nose testing could not be completed due to clinical state Skin: Warm & dry w/o tenting. No significant lesions or rash.  See summary under each active problem in the Problem List with associated updated therapeutic plan

## 2019-10-14 NOTE — Assessment & Plan Note (Addendum)
10/01/2019 A1c 5.6%.  Metformin continued by [facility] NP because of dietary noncompliance.

## 2019-10-14 NOTE — Assessment & Plan Note (Signed)
10/01/2019 anemia has resolved.

## 2020-02-03 ENCOUNTER — Encounter: Payer: Self-pay | Admitting: Internal Medicine

## 2020-02-03 ENCOUNTER — Non-Acute Institutional Stay (SKILLED_NURSING_FACILITY): Payer: Medicare Other | Admitting: Internal Medicine

## 2020-02-03 DIAGNOSIS — E785 Hyperlipidemia, unspecified: Secondary | ICD-10-CM

## 2020-02-03 DIAGNOSIS — Z8673 Personal history of transient ischemic attack (TIA), and cerebral infarction without residual deficits: Secondary | ICD-10-CM

## 2020-02-03 DIAGNOSIS — E1151 Type 2 diabetes mellitus with diabetic peripheral angiopathy without gangrene: Secondary | ICD-10-CM | POA: Diagnosis not present

## 2020-02-03 DIAGNOSIS — R29818 Other symptoms and signs involving the nervous system: Secondary | ICD-10-CM

## 2020-02-03 DIAGNOSIS — I10 Essential (primary) hypertension: Secondary | ICD-10-CM

## 2020-02-03 DIAGNOSIS — R4189 Other symptoms and signs involving cognitive functions and awareness: Secondary | ICD-10-CM

## 2020-02-03 NOTE — Assessment & Plan Note (Signed)
BP controlled; no change in antihypertensive medications  

## 2020-02-03 NOTE — Progress Notes (Signed)
   NURSING HOME LOCATION:  [facility] ROOM NUMBER:  226-A  CODE STATUS:  FULL CODE  PCP:  Carolyn Lawless, MD  [address] [place] [address]  This is a nursing facility follow up of chronic medical diagnoses  Interim medical record and care since last [facility] visit was updated with review of diagnostic studies and change in clinical status since last visit were documented.  HPI: She is a permanent resident of facility with diagnoses of history of CVA, diabetes with vascular complications, essential hypertension, history of heart failure, neurocognitive deficits, history of breast cancer, history of schizophrenic/bipolar disorder, normochromic/normocytic anemia, history of vitamin D deficiency, and dyslipidemia.  Surgeries and procedures include cholecystectomy and left mastectomy with axillary sentinel node biopsy.  Review of systems: Dementia invalidated responses which were totally negative except for apparent watery eyes without other ophthalmologic or extrinsic symptoms.  Constitutional: No fever, significant weight change, fatigue  Eyes: No redness, pain, vision change ENT/mouth: No nasal congestion,  purulent discharge, earache, change in hearing, sore throat  Cardiovascular: No chest pain, palpitations, paroxysmal nocturnal dyspnea, claudication, edema  Respiratory: No cough, sputum production, hemoptysis, DOE, significant snoring, apnea   Gastrointestinal: No heartburn, dysphagia, abdominal pain, nausea /vomiting, rectal bleeding, melena, change in bowels Genitourinary: No dysuria, hematuria, pyuria, incontinence, nocturia Musculoskeletal: No joint stiffness, joint swelling, weakness, pain Dermatologic: No rash, pruritus, change in appearance of skin Neurologic: No dizziness, headache, syncope, seizures, numbness, tingling Psychiatric: No significant anxiety, depression, insomnia, anorexia Endocrine: No change in hair/skin/nails, excessive thirst,  excessive hunger, excessive urination  Hematologic/lymphatic: No significant bruising, lymphadenopathy, abnormal bleeding Allergy/immunology: No itchy/watery eyes, significant sneezing, urticaria, angioedema  Physical exam:  Pertinent or positive findings: She lies in bed with minimal spontaneous activity.  There is thinning of the scalp anteriorly.  There is intermittent exotropia of the left eye.  Facies are blank.  She is wearing only the upper plate.  There is hirsutism over the upper lip and chin.  She responds in monosyllables with a baby talk cadence. Pedal pulses are decreased.  She has 1/2+ edema at the sock line.  The upper extremities are clinically stronger than the lower extremities.  When the right foot was stimulated the grat toe was upgoing.  General appearance: Adequately nourished; no acute distress, increased work of breathing is present.   Lymphatic: No lymphadenopathy about the head, neck, axilla. Eyes: No conjunctival inflammation or lid edema is present. There is no scleral icterus. Ears:  External ear exam shows no significant lesions or deformities.   Nose:  External nasal examination shows no deformity or inflammation. Nasal mucosa are pink and moist without lesions, exudates Neck:  No thyromegaly, masses, tenderness noted.    Heart:  Normal rate and regular rhythm. S1 and S2 normal without gallop, murmur, click, rub .  Lungs:  without wheezes, rhonchi, rales, rubs. Abdomen: Bowel sounds are normal. Abdomen is soft and nontender with no organomegaly, hernias, masses. GU: Deferred  Extremities:  No cyanosis, clubbing Neurologic exam :Balance, Rhomberg, finger to nose testing could not be completed due to clinical state Skin: Warm & dry w/o tenting. No significant lesions or rash.  See summary under each active problem in the Problem List with associated updated therapeutic plan

## 2020-02-03 NOTE — Patient Instructions (Signed)
See assessment and plan under each diagnosis in the problem list and acutely for this visit 

## 2020-02-03 NOTE — Assessment & Plan Note (Signed)
Fasting glucoses range from 96 up to 112 indicating excellent control. As noted last A1c was prediabetic with a value of 5.6%. No change indicated.

## 2020-02-03 NOTE — Assessment & Plan Note (Addendum)
Appropriately low-dose aspirin is being continued as secondary prevention.

## 2020-02-03 NOTE — Assessment & Plan Note (Signed)
Based on exam today there is been no significant change neuro cognitively.  Staff reports no behavioral issues at this time.

## 2020-02-03 NOTE — Assessment & Plan Note (Signed)
As noted LDL was 122; but the history of intolerance/allergy to atorvastatin precludes statin therapy. She remains on secondary prevention with low-dose aspirin.

## 2020-03-15 ENCOUNTER — Non-Acute Institutional Stay: Payer: Medicare Other | Admitting: Hospice

## 2020-03-15 ENCOUNTER — Other Ambulatory Visit: Payer: Self-pay

## 2020-03-15 DIAGNOSIS — C50912 Malignant neoplasm of unspecified site of left female breast: Secondary | ICD-10-CM

## 2020-03-15 DIAGNOSIS — I509 Heart failure, unspecified: Secondary | ICD-10-CM

## 2020-03-15 DIAGNOSIS — Z515 Encounter for palliative care: Secondary | ICD-10-CM

## 2020-03-15 NOTE — Progress Notes (Signed)
[facility] Palliative Care Consult Note Telephone: [phone]  Fax: [phone]  PATIENT NAME: Carolyn Parrish DOB: [birth year] MRN: [id number]  PRIMARY CARE PROVIDER:   Hendricks Limes, MD Hendricks Limes, MD [address] [place],  [place] [address]  REFERRING PROVIDER: Hendricks Limes, MD Hendricks Limes, [place],  [place] [address] RESPONSIBLE PARTY:Extended Emergency Contact Information Primary Emergency Contact: Renika, [place] Mobile Phone: [phone] Relation: Daughter  Visit is to build trust and highlight Palliative Medicine as specialized medical care for people living with serious illness, aimed at facilitating better quality of life through symptoms relief, assisting with advance care plan and establishing goals of care. NP called and updated Carolyn Parrish on visit. She expressed appreciation for the update.   CHIEF COMPLAINT: Follow up palliative visit/Congestive heart failure  RECOMMENDATIONS/PLAN:   1. Advance Care Planning/Code Status: Advance directives reviewed.  [name] affirmed that patient remains a full code at this time.  2.  Goals of Care: Goals of care include to maximize quality of life and symptom management.  I spent 25 minutes providing this consultation. More than 50% of the time in this consultation was spent on coordinating communication.  -------------------------------------------------------------------------------------------------------------------------------------------------- 3. Symptom management/Plan:  Chronic congestive heart failure, no exacerbation. Currently off Lasix. BP well controlled. Resume Lasix 20mg  daily and titrate up as needed with fluid build up. Palliative will continue to monitor for symptom management/decline and make recommendations as needed. Return 2 months or prn. Encouraged to call provider sooner with any concerns.   HISTORY OF PRESENT ILLNESS: Follow-up visit  for review of chronic illnesses specifically  congestive heart failure.  No report of fluid overload/edema, no cough, no respiratory distress.  Patient is currently off Lasix, BP well controlled. Carolyn D Greenleeis a 80 y.o.year oldfemalewith multiple medical problems including breast cancer, CAD, DM2, schizophreniaCVA.  Palliative Care was asked to help address goals of care and complex decision making. Review and summarization of old Epic records shows history from other than patient. Rest of 10 point ROS asked and negative.  CODE STATUS: Full  PPS: 30%  HOSPICE ELIGIBILITY/DIAGNOSIS: TBD  PAST MEDICAL HISTORY:  Past Medical History:  Diagnosis Date  . Cancer (HCC)    BREAST CANCER  . CHF (congestive heart failure) ([place])   . DM (diabetes mellitus), secondary, with peripheral vascular complications ([place])   . Hyperlipidemia   . Hypertension   . Schizoaffective disorder ([place])   . Stroke [facility])     SOCIAL HX: Patient in SNF for ongoing care.   FAMILY HX: No family history on file.  Labs:   No results for input(s): WBC, HGB, HCT, PLT, MCV in the last 168 hours. No results for input(s): NA, K, CL, CO2, BUN, CREATININE, GLUCOSE in the last 168 hours. Latest GFR by Cockcroft Gault (not valid in AKI or ESRD) CrCl cannot be calculated (Patient's most recent lab result is older than the maximum 21 days allowed.). No results for input(s): AST, ALT, ALKPHOS, GGT in the last 168 hours.  Invalid input(s): TBILI, CONJBILI, ALB, TOTALPROTEIN No components found for: ALB No results for input(s): APTT, INR in the last 168 hours.  Invalid input(s): PTPATIENT No results for input(s): BNP, PROBNP in the last 168 hours.  ALLERGIES:  Allergies  Allergen Reactions  . Atorvastatin     UNSPECIFIED REACTION       PERTINENT MEDICATIONS:  Outpatient Encounter Medications as of 03/15/2020  Medication Sig  . acetaminophen (TYLENOL) 500 MG  tablet Take 500 mg by mouth every 4 (four) hours as  needed.  Marland Kitchen alum & mag hydroxide-simeth (MYLANTA) 200-200-20 MG/5ML suspension Take 30 mLs by mouth every 4 (four) hours as needed for indigestion or heartburn.  . anastrozole (ARIMIDEX) 1 MG tablet Take 1 tablet (1 mg total) by mouth daily.  Marland Kitchen aspirin EC 81 MG tablet Take 81 mg by mouth daily.  . bisacodyl (DULCOLAX) 10 MG suppository Place 10 mg rectally as needed for moderate constipation.  . camphor-menthol (SARNA) lotion Apply 1 application topically 2 (two) times daily as needed for itching.  . Cholecalciferol (VITAMIN D3) 25 MCG (1000 UT) CAPS Take 1 capsule by mouth daily.  . divalproex (DEPAKOTE ER) 500 MG 24 hr tablet Take 500 mg by mouth daily.   Marland Kitchen docusate sodium (COLACE) 100 MG capsule Take 100 mg by mouth 2 (two) times daily. For constipation  . magnesium hydroxide (MILK OF MAGNESIA) 400 MG/5ML suspension Take 30 mLs by mouth daily as needed for mild constipation.  . metFORMIN (GLUCOPHAGE) 500 MG tablet Take 500 mg by mouth daily.   Marland Kitchen OLANZapine (ZYPREXA) 5 MG tablet Take 1 tablet (5 mg total) by mouth at bedtime.  Marland Kitchen oxycodone (OXY-IR) 5 MG capsule Take 5 mg by mouth every 4 (four) hours as needed for pain.  . polyethylene glycol (MIRALAX / GLYCOLAX) packet Take 17 g by mouth daily.  . sennosides-docusate sodium (SENOKOT-S) 8.6-50 MG tablet Take 1 tablet by mouth at bedtime.  . Sodium Phosphates (RA SALINE ENEMA) 19-7 GM/118ML ENEM Place 1 each rectally as needed (for constipation).  . vitamin B-12 (CYANOCOBALAMIN) 500 MCG tablet Take 500 mcg by mouth daily.   No facility-administered encounter medications on file as of 03/15/2020.    ROS  General: NAD EYES: denies vision changes ENMT: denies dysphagia no xerostomia Cardiovascular: denies chest pain Pulmonary: denies  cough, denies SOB  Abdomen: endorses fair appetite, no constipation or diarrhea GU: denies dysuria or urinary frequency MSK:  endorses ROM limitations, no falls reported Skin: denies rashes or  wounds Neurological: endorses weakness, denies pain, denies insomnia Psych: Endorses positive mood Heme/lymph/immuno: denies bruises, abnormal bleeding   PHYSICAL EXAM General: In no acute distress,  Cardiovascular: regular rate and rhythm Pulmonary: no cough, no increased work of breathing, normal respiratory effort on room air Abdomen: soft, non tender, positive bowel sounds in all quadrants GU:  no suprapubic tenderness Eyes: Normal lids, no discharge, sclera anicteric ENMT: Moist mucous membranes Musculoskeletal:  no edema in BLE Skin: no rash to visible skin, warm without cyanosis Psych: non-anxious affect Neurological: Weakness but otherwise non focal Heme/lymph/immuno: no bruises, no bleeding  Palliative Care was asked to follow this patient by consultation request of Hendricks Limes, MD to help address advance care planning and complex decision making. Thank you for the opportunity to participate in the care of Dorthea D Corliss Please call our office at [phone] if we can be of additional assistance.  Note: Portions of this note were generated with Lobbyist. Dictation errors may occur despite best attempts at proofreading.  Teodoro Spray, NP

## 2020-04-30 ENCOUNTER — Non-Acute Institutional Stay: Payer: Medicare Other | Admitting: Hospice

## 2020-04-30 ENCOUNTER — Other Ambulatory Visit: Payer: Self-pay

## 2020-04-30 DIAGNOSIS — Z515 Encounter for palliative care: Secondary | ICD-10-CM

## 2020-04-30 DIAGNOSIS — I10 Essential (primary) hypertension: Secondary | ICD-10-CM

## 2020-04-30 NOTE — Progress Notes (Addendum)
[facility] Palliative [facility] Consult Note Telephone: [phone]  Fax: [phone]  PATIENT NAME: Carolyn Parrish DOB: 01/25/1941 MRN: [id number]  PRIMARY CARE PROVIDER:   Hendricks Limes, MD Hendricks Limes, [place],  [place] [address]  REFERRING PROVIDER: Hendricks Limes, MD Hendricks Limes, MD [address] [place],  [place] [address]  RESPONSIBLE PARTY:  [name]   Visit is to build trust and highlight Palliative Medicine as specialized medical care for people living with serious illness, aimed at facilitating better quality of life through symptoms relief, assisting with advance care plan and establishing goals of care.   CHIEF COMPLAINT: Palliative focused visit/Essential Hypertension  RECOMMENDATIONS/PLAN:   1. Advance Care Planning/Code Status: Patient is a full code  2. Goals of Care: Goals of care include to maximize quality of life and symptom management.  Visit consisted of counseling and education dealing with the complex and emotionally intense issues of symptom management and palliative care in the setting of serious and potentially life-threatening illness. Palliative care team will continue to support patient, patient's family, and medical team.  3. Symptom management/Plan:  BP controlled; no change in antihypertensive medications Chronic congestive heart failure, no exacerbation. Currently off Lasix. Resume Lasix 20mg  daily and titrate up as needed with fluid build up. Palliative will continue to monitor for symptom management/decline and make recommendations as needed. Return2 months or prn. Encouraged to call provider sooner with any concerns.   Palliative will continue to monitor for symptom management/decline and make recommendations as needed. Return 2 months or prn. Encouraged to call provider sooner with any concerns.   HISTORY OF PRESENT ILLNESS:  Carolyn Parrish is a 80 y.o. female with multiple medical  problems including CAD, DM2, schizophrenia, dyslipidemia, CHF,CVA.  Visit for review of essential hypertension; patient denies headaches, palpitations, blood pressure readings fairly stable.  Patient with limited speech, ROS from patient, chart review and discussion with primary nurse.    Review and summarization of Epic records shows history from other than patient. Rest of 10 point ROS asked and negative.  Palliative Care was asked to follow this patient by consultation request of Hendricks Limes, MD to help address complex decision making in the context of advance care planning and goals of care clarification.   CODE STATUS: Full  PPS: 30%  HOSPICE ELIGIBILITY/DIAGNOSIS: TBD  PAST MEDICAL HISTORY:  Past Medical History:  Diagnosis Date   Cancer ([place])    BREAST CANCER   CHF (congestive heart failure) (HCC)    DM (diabetes mellitus), secondary, with peripheral vascular complications (HCC)    Hyperlipidemia    Hypertension    Schizoaffective disorder ([place])    Stroke ([place])     SOCIAL HX: @SOCX  Patient at SNF  for ongoing care   FAMILY HX: No family history on file.  Review lab tests/diagnostics No results for input(s): WBC, HGB, HCT, PLT, MCV in the last 168 hours. No results for input(s): NA, K, CL, CO2, BUN, CREATININE, GLUCOSE in the last 168 hours. Latest GFR by Cockcroft Gault (not valid in AKI or ESRD) CrCl cannot be calculated (Patient's most recent lab result is older than the maximum 21 days allowed.). No results for input(s): AST, ALT, ALKPHOS, GGT in the last 168 hours.  Invalid input(s): TBILI, CONJBILI, ALB, TOTALPROTEIN No components found for: ALB No results for input(s): APTT, INR in the last 168 hours.  Invalid input(s): PTPATIENT No results for input(s): BNP, PROBNP in the  last 168 hours.  ALLERGIES:  Allergies  Allergen Reactions   Atorvastatin     UNSPECIFIED REACTION       PERTINENT MEDICATIONS:  Outpatient Encounter Medications as of  04/30/2020  Medication Sig   acetaminophen (TYLENOL) 500 MG tablet Take 500 mg by mouth every 4 (four) hours as needed.   alum & mag hydroxide-simeth (MYLANTA) 200-200-20 MG/5ML suspension Take 30 mLs by mouth every 4 (four) hours as needed for indigestion or heartburn.   anastrozole (ARIMIDEX) 1 MG tablet Take 1 tablet (1 mg total) by mouth daily.   aspirin EC 81 MG tablet Take 81 mg by mouth daily.   bisacodyl (DULCOLAX) 10 MG suppository Place 10 mg rectally as needed for moderate constipation.   camphor-menthol (SARNA) lotion Apply 1 application topically 2 (two) times daily as needed for itching.   Cholecalciferol (VITAMIN D3) 25 MCG (1000 UT) CAPS Take 1 capsule by mouth daily.   divalproex (DEPAKOTE ER) 500 MG 24 hr tablet Take 500 mg by mouth daily.    docusate sodium (COLACE) 100 MG capsule Take 100 mg by mouth 2 (two) times daily. For constipation   magnesium hydroxide (MILK OF MAGNESIA) 400 MG/5ML suspension Take 30 mLs by mouth daily as needed for mild constipation.   metFORMIN (GLUCOPHAGE) 500 MG tablet Take 500 mg by mouth daily.    OLANZapine (ZYPREXA) 5 MG tablet Take 1 tablet (5 mg total) by mouth at bedtime.   oxycodone (OXY-IR) 5 MG capsule Take 5 mg by mouth every 4 (four) hours as needed for pain.   polyethylene glycol (MIRALAX / GLYCOLAX) packet Take 17 g by mouth daily.   sennosides-docusate sodium (SENOKOT-S) 8.6-50 MG tablet Take 1 tablet by mouth at bedtime.   Sodium Phosphates (RA SALINE ENEMA) 19-7 GM/118ML ENEM Place 1 each rectally as needed (for constipation).   vitamin B-12 (CYANOCOBALAMIN) 500 MCG tablet Take 500 mcg by mouth daily.   No facility-administered encounter medications on file as of 04/30/2020.    ROS  General: NAD EYES: denies vision changes ENMT: denies dysphagia no xerostomia Cardiovascular: denies chest pain Pulmonary: denies cough, denies SOB  Abdomen: endorses fair appetite, no constipation or diarrhea GU: denies dysuria  or urinary frequency MSK: endorses ROM limitations, no falls reported Skin: denies rashes or wounds Neurological: endorses weakness, denies pain, denies insomnia Psych: Endorses positive mood Heme/lymph/immuno: denies bruises, abnormal bleeding   PHYSICAL EXAM BP 110/52 P85 R18 02 96% RA General: In no acute distress,  Cardiovascular: regular rate and rhythm Pulmonary: no cough, no increased work of breathing, normal respiratory effort on room air Abdomen: soft, non tender, positive bowel sounds in all quadrants GU:  no suprapubic tenderness Eyes: Normal lids, no discharge, sclera anicteric ENMT: Moist mucous membranes Musculoskeletal: She is weak to opposition in all extremities Skin: Dry skin, warm without cyanosis Psych: non-anxious affect Neurological: Weakness but otherwise non focal Heme/lymph/immuno: no bruises, no bleeding  Thank you for the opportunity to participate in the care of Clark D Harshberger Please call our office at [phone] if we can be of additional assistance.  Note: Portions of this note were generated with Lobbyist. Dictation errors may occur despite best attempts at proofreading.  Teodoro Spray, NP

## 2020-04-30 NOTE — Addendum Note (Signed)
Addended by: Laverda Sorenson I on: 04/30/2020 05:24 PM   Modules accepted: Level of Service

## 2020-05-25 ENCOUNTER — Non-Acute Institutional Stay (SKILLED_NURSING_FACILITY): Payer: Medicare Other | Admitting: Internal Medicine

## 2020-05-25 ENCOUNTER — Encounter: Payer: Self-pay | Admitting: Internal Medicine

## 2020-05-25 DIAGNOSIS — I110 Hypertensive heart disease with heart failure: Secondary | ICD-10-CM | POA: Diagnosis not present

## 2020-05-25 DIAGNOSIS — I10 Essential (primary) hypertension: Secondary | ICD-10-CM | POA: Diagnosis not present

## 2020-05-25 DIAGNOSIS — R4189 Other symptoms and signs involving cognitive functions and awareness: Secondary | ICD-10-CM

## 2020-05-25 DIAGNOSIS — E1151 Type 2 diabetes mellitus with diabetic peripheral angiopathy without gangrene: Secondary | ICD-10-CM | POA: Diagnosis not present

## 2020-05-25 DIAGNOSIS — C50212 Malignant neoplasm of upper-inner quadrant of left female breast: Secondary | ICD-10-CM

## 2020-05-25 DIAGNOSIS — F258 Other schizoaffective disorders: Secondary | ICD-10-CM

## 2020-05-25 DIAGNOSIS — Z170 Estrogen receptor positive status [ER+]: Secondary | ICD-10-CM

## 2020-05-25 DIAGNOSIS — R29818 Other symptoms and signs involving the nervous system: Secondary | ICD-10-CM

## 2020-05-25 NOTE — Assessment & Plan Note (Signed)
At this time there is no neck vein distention, hepatojugular reflux, or peripheral edema.  Blood pressure actually is low.  Recent range has been 88/54 up to a high of 119/64.

## 2020-05-25 NOTE — Progress Notes (Signed)
   NURSING HOME LOCATION:  [facility]  ROOM NUMBER:  [id number]  CODE STATUS:  Full Code  PCP:  Pascal Lux MD  This is a nursing facility follow up visit of chronic medical diagnoses & to document compliance with Regulation 483.30 (c) in [employer] [place] [employer] Phase 2 which mandates caregiver visit ( visits can alternate among physician, PA or NP as per statutes) within 10 days of 30 days / 60 days/ 90 days post admission to SNF date    Interim medical record and care since last SNF visit was updated with review of diagnostic studies and change in clinical status since last visit were documented.  HPI: She is a permanent resident facility with medical diagnoses of history of stroke, neurocognitive deficits, diabetes with vascular complications, essential hypertension, history of schizophrenia/bipolar disorder, chronic anemia, dyslipidemia, and essential hypertension. Significant surgeries include cholecystectomy and left mastectomy.  Review of systems: Neurocognitive deficits precluded completing review of systems. She was initially asleep but did awaken. She was almost nonverbal and speech was  singsong and indiscernible.   Speech therapy has been following the patient because of high risk of aspiration in the context of history of stroke.  She would not chew  food well before attempting to swallow.  On a pured diet she had frequent gagging.  She was switched to mechanical soft.  These issues were associated with some weight loss. Despite this her BMI is still remains in the obesity range.  Physical exam:  Pertinent or positive findings: As noted she was very lethargic.  There is slight exotropia on the left.  Her hair is thin.  She is wearing only the upper plate.  She has hirsutism of the lip and chin and upper neck which has been shaved.  S4 is present. The abdomen is protuberant.  Posterior tibial pulses are stronger than dorsalis pedis pulses.  She would not  oppose my hand to test strength in any of the extremities.  When the feet was stimulated right toe was upgoing.  General appearance: Adequately nourished; no acute distress, increased work of breathing is present.   Lymphatic: No lymphadenopathy about the head, neck, axilla. Eyes: No conjunctival inflammation or lid edema is present. There is no scleral icterus. Ears:  External ear exam shows no significant lesions or deformities.   Nose:  External nasal examination shows no deformity or inflammation. Nasal mucosa are pink and moist without lesions, exudates Neck:  No thyromegaly, masses, tenderness noted.    Heart:  No murmur, click, rub .  Lungs: without wheezes, rhonchi, rales, rubs. Abdomen: Bowel sounds are normal. Abdomen is soft and nontender with no organomegaly, hernias, masses. GU: Deferred  Extremities:  No cyanosis, clubbing, edema  Neurologic exam :Balance, Rhomberg, finger to nose testing could not be completed due to clinical state Skin: Warm & dry w/o tenting. No significant lesions or rash.  See summary under each active problem in the Problem List with associated updated therapeutic plan

## 2020-05-25 NOTE — Assessment & Plan Note (Addendum)
The last A1c in Epic was in August 2021 and was nondiabetic at 5.6%.  [facility] NP will be asked to update A1c.

## 2020-05-25 NOTE — Assessment & Plan Note (Signed)
Today she was essentially nonverbal and would not follow commands although she was easy to arouse.  No behavioral issues reported.

## 2020-05-25 NOTE — Assessment & Plan Note (Addendum)
She remains on Arimidex maintenance chemotherapy @ SNF.

## 2020-05-25 NOTE — Assessment & Plan Note (Signed)
Clinically stable at this time. Psych NP monitors at SNF.

## 2020-05-25 NOTE — Patient Instructions (Signed)
See assessment and plan under each diagnosis in the problem list and acutely for this visit 

## 2020-05-25 NOTE — Assessment & Plan Note (Signed)
Blood pressures are actually low on no antihypertensive regimen at this time.  Range varies from a low of 88/54 up to a high of 119/64.

## 2020-06-29 ENCOUNTER — Telehealth: Payer: Self-pay | Admitting: Oncology

## 2020-06-29 NOTE — Telephone Encounter (Signed)
Scheduled appt per 5/24 sch msg. Pt's caregiver is aware.

## 2020-07-06 ENCOUNTER — Telehealth: Payer: Self-pay

## 2020-07-06 NOTE — Telephone Encounter (Signed)
Received call from NP at [facility] facility attempting to contact Palliative NP, Livina. Provided update that Windy Canny is out of the office today. Facility NP would like PC NP to visit with in the next few weeks as she has seen changes in patient. Message sent to Palliative NP for follow up

## 2020-07-09 ENCOUNTER — Other Ambulatory Visit: Payer: Self-pay

## 2020-07-09 ENCOUNTER — Non-Acute Institutional Stay: Payer: Medicare Other | Admitting: Hospice

## 2020-07-09 DIAGNOSIS — R5381 Other malaise: Secondary | ICD-10-CM

## 2020-07-09 DIAGNOSIS — Z515 Encounter for palliative care: Secondary | ICD-10-CM

## 2020-07-09 DIAGNOSIS — F0390 Unspecified dementia without behavioral disturbance: Secondary | ICD-10-CM

## 2020-07-09 NOTE — Progress Notes (Signed)
[facility] Palliative Care Consult Note Telephone: [phone]  Fax: [phone]  PATIENT NAME: Carolyn Parrish DOB: 1940-10-19 MRN: [id number]  PRIMARY CARE PROVIDER:   Hendricks Limes, MD Hendricks Limes, MD [address] [place],  [place] [address]  REFERRING PROVIDER: Hendricks Limes, MD Hendricks Limes, [place],  [place] [address]  RESPONSIBLE PARTY:Extended Emergency Contact Information Primary Emergency Contact: [name], [name] Mobile Phone: [phone] Relation: Daughter Contact Information    Name Relation Home Work Mobile   Navayah, Sok Daughter   [phone]      Visit is to build trust and highlight Palliative Medicine as specialized medical care for people living with serious illness, aimed at facilitating better quality of life through symptoms relief, assisting with advance care planning and complex medical decision making.   RECOMMENDATIONS/PLAN:   Advance Care Planning/Code Status: Patient's CODE STATUS has changed she is now a DO NOT RESUSCITATE.  Goals of Care: Goals of care include to maximize quality of life and symptom management.  Discussion with [name] on de-escalation of disease focused care instead comfort and symptom management. MOST selections include limited additional intervention, IV fluids for defined trial period, antibiotics if indicated, feeding tube for defined trial period.  [name] said family is open to making sure patient is comfortable but is not ready for hospice care at this time.  Visit consisted of counseling and education dealing with the complex and emotionally intense issues of symptom management and palliative care in the setting of serious and potentially life-threatening illness. Palliative care team will continue to support patient, patient's family, and medical team.  I spent 25  minutes providing this consultation. More than 50% of the time in this consultation  was spent on coordinating communication.  -------------------------------------------------------------------------------------------------------------------------------------------------- Symptom management/Plan:  Physical debility: Continue ongoing PT OT.  Provide assistance during meals to ensure adequate oral intake. Repeat CBC BMP in a week Dysphagia: Continue pured diet as ordered.  Aspiration precautions discussed. ST consult as needed Low back pain: Continue Tylenol and oxycodone as ordered.  Repositioning for comfort.  Dementia: Fast 7b, bedbound, incontinent of bowel and bladder and limited communication ability Follow up: Palliative care will continue to follow for complex medical decision making, advance care planning, and clarification of goals. Return 6 weeks or prn. Encouraged to call provider sooner with any concerns.  CHIEF COMPLAINT: Palliative follow up visit/physical debility  HISTORY OF PRESENT ILLNESS:  Carolyn Parrish a 80 y.o. female with multiple medical problems including physical debility, which has worsened in the past 2 weeks likely related to her comorbid conditions and elevated WBC count/pneumonitis for which patient was treated with antibiotics and IV fluids last week.  Patient with associated increasing fatigue, dysphagia which impairs her independence and quality of life.  She said her fatigue and debility worsens as the day progresses; ongoing PT OT is helpful.  Patient also reports chronic pain in her low back, currently well managed with Tylenol and occasional oxycodone.  History of breast cancer, CAD, DM2, schizophreniaCVA.  Collaboration discussion with [facility] PCP who reports sharp decline in patient's Mini-Mental score; 22 in February 2022 and 11 06/22/2020 -she reports her new diagnosis of dementia without behavioral disturbance.  History obtained from review of EMR, discussion with primary team, family and/or patient. Records reviewed and summarized above. All  10 point systems reviewed and are negative except as documented in history of present illness above  Review and summarization of Epic records shows  history from other than patient.   Palliative Care was asked to follow this patient o help address complex decision making in the context of advance care planning and goals of care clarification.   PPS: 30%  PHYSICAL EXAM  General: In no acute distress, appropriately dressed Cardiovascular: regular rate and rhythm; no edema in BLE Pulmonary: no cough, no increased work of breathing, normal respiratory effort Abdomen: soft, non tender, no guarding, positive bowel sounds in all quadrants GU:  no suprapubic tenderness Eyes: Normal lids, no discharge ENMT: Moist mucous membranes Musculoskeletal:  weakness, endorsed pain in low back Skin: no rash to visible skin, warm without cyanosis,  Psych: non-anxious affect Neurological: Weakness but otherwise non focal, Mini-Mental score 11 per [facility] PCP Heme/lymph/immuno: no bruises, no bleeding  PERTINENT MEDICATIONS:  Outpatient Encounter Medications as of 07/09/2020  Medication Sig  . acetaminophen (TYLENOL) 500 MG tablet Take 500 mg by mouth every 4 (four) hours as needed.  Marland Kitchen alum & mag hydroxide-simeth (MYLANTA) 200-200-20 MG/5ML suspension Take 30 mLs by mouth every 4 (four) hours as needed for indigestion or heartburn.  . anastrozole (ARIMIDEX) 1 MG tablet Take 1 tablet (1 mg total) by mouth daily.  Marland Kitchen aspirin EC 81 MG tablet Take 81 mg by mouth daily.  . bisacodyl (DULCOLAX) 10 MG suppository Place 10 mg rectally as needed for moderate constipation.  . camphor-menthol (SARNA) lotion Apply 1 application topically 2 (two) times daily as needed for itching.  . Cholecalciferol (VITAMIN D3) 25 MCG (1000 UT) CAPS Take 1 capsule by mouth daily.  . divalproex (DEPAKOTE ER) 500 MG 24 hr tablet Take 500 mg by mouth daily.   Marland Kitchen docusate sodium (COLACE) 100 MG capsule Take 100 mg by mouth 2 (two) times daily.  For constipation  . magnesium hydroxide (MILK OF MAGNESIA) 400 MG/5ML suspension Take 30 mLs by mouth daily as needed for mild constipation.  . metFORMIN (GLUCOPHAGE) 500 MG tablet Take 500 mg by mouth daily.   Marland Kitchen OLANZapine (ZYPREXA) 5 MG tablet Take 1 tablet (5 mg total) by mouth at bedtime.  Marland Kitchen oxycodone (OXY-IR) 5 MG capsule Take 5 mg by mouth every 4 (four) hours as needed for pain.  . polyethylene glycol (MIRALAX / GLYCOLAX) packet Take 17 g by mouth daily.  . sennosides-docusate sodium (SENOKOT-S) 8.6-50 MG tablet Take 1 tablet by mouth at bedtime.  . Sodium Phosphates (RA SALINE ENEMA) 19-7 GM/118ML ENEM Place 1 each rectally as needed (for constipation).  . vitamin B-12 (CYANOCOBALAMIN) 500 MCG tablet Take 500 mcg by mouth daily.   No facility-administered encounter medications on file as of 07/09/2020.    HOSPICE ELIGIBILITY/DIAGNOSIS: TBD  PAST MEDICAL HISTORY:  Past Medical History:  Diagnosis Date  . Cancer (HCC)    BREAST CANCER  . CHF (congestive heart failure) ([place])   . DM (diabetes mellitus), secondary, with peripheral vascular complications ([place])   . Hyperlipidemia   . Hypertension   . Schizoaffective disorder ([place])   . Stroke [facility])       ALLERGIES:  Allergies  Allergen Reactions  . Atorvastatin     UNSPECIFIED REACTION       Thank you for the opportunity to participate in the care of Bobbijo D Emmer Please call our office at [phone] if we can be of additional assistance.  Note: Portions of this note were generated with Lobbyist. Dictation errors may occur despite best attempts at proofreading.  Teodoro Spray, NP

## 2020-08-16 ENCOUNTER — Inpatient Hospital Stay: Payer: Medicare Other | Attending: Oncology | Admitting: Oncology

## 2020-08-16 ENCOUNTER — Inpatient Hospital Stay: Payer: Medicare Other

## 2020-08-16 ENCOUNTER — Other Ambulatory Visit: Payer: Self-pay

## 2020-08-16 VITALS — BP 123/65 | HR 88 | Temp 97.9°F | Resp 16 | Ht 66.0 in

## 2020-08-16 DIAGNOSIS — C50912 Malignant neoplasm of unspecified site of left female breast: Secondary | ICD-10-CM

## 2020-08-16 DIAGNOSIS — E119 Type 2 diabetes mellitus without complications: Secondary | ICD-10-CM | POA: Insufficient documentation

## 2020-08-16 DIAGNOSIS — I6359 Cerebral infarction due to unspecified occlusion or stenosis of other cerebral artery: Secondary | ICD-10-CM

## 2020-08-16 DIAGNOSIS — Z7982 Long term (current) use of aspirin: Secondary | ICD-10-CM | POA: Diagnosis not present

## 2020-08-16 DIAGNOSIS — C50412 Malignant neoplasm of upper-outer quadrant of left female breast: Secondary | ICD-10-CM | POA: Insufficient documentation

## 2020-08-16 DIAGNOSIS — I110 Hypertensive heart disease with heart failure: Secondary | ICD-10-CM | POA: Insufficient documentation

## 2020-08-16 DIAGNOSIS — Z7984 Long term (current) use of oral hypoglycemic drugs: Secondary | ICD-10-CM | POA: Diagnosis not present

## 2020-08-16 DIAGNOSIS — I509 Heart failure, unspecified: Secondary | ICD-10-CM | POA: Insufficient documentation

## 2020-08-16 DIAGNOSIS — Z170 Estrogen receptor positive status [ER+]: Secondary | ICD-10-CM | POA: Insufficient documentation

## 2020-08-16 DIAGNOSIS — Z79899 Other long term (current) drug therapy: Secondary | ICD-10-CM | POA: Diagnosis not present

## 2020-08-16 DIAGNOSIS — Z79811 Long term (current) use of aromatase inhibitors: Secondary | ICD-10-CM | POA: Insufficient documentation

## 2020-08-16 DIAGNOSIS — F258 Other schizoaffective disorders: Secondary | ICD-10-CM

## 2020-08-16 DIAGNOSIS — Z923 Personal history of irradiation: Secondary | ICD-10-CM | POA: Diagnosis not present

## 2020-08-16 DIAGNOSIS — Z8673 Personal history of transient ischemic attack (TIA), and cerebral infarction without residual deficits: Secondary | ICD-10-CM | POA: Diagnosis not present

## 2020-08-16 DIAGNOSIS — C50212 Malignant neoplasm of upper-inner quadrant of left female breast: Secondary | ICD-10-CM | POA: Diagnosis not present

## 2020-08-16 DIAGNOSIS — Z7189 Other specified counseling: Secondary | ICD-10-CM

## 2020-08-16 DIAGNOSIS — E785 Hyperlipidemia, unspecified: Secondary | ICD-10-CM | POA: Diagnosis not present

## 2020-08-16 NOTE — Progress Notes (Signed)
[facility]  [phone] [phone]     ID: EMONY DORMER DOB: 1940-09-04  MR#: [id number]  DUK#:[id number]  Patient Care Team: Hendricks Limes, MD as PCP - General (Internal Medicine) Erroll Luna, MD as Consulting Physician (General Surgery) Ell Tiso, Virgie Dad, MD as Consulting Physician (Oncology) Kyung Rudd, MD as Consulting Physician (Radiation Oncology) Jason Coop, NP as Nurse Practitioner (Hospice and Palliative Medicine) Teodoro Spray, NP as Nurse Practitioner (Family Medicine) OTHER MD:   CHIEF COMPLAINT: Estrogen receptor positive breast cancer  CURRENT TREATMENT: anastrozole   INTERVAL HISTORY: Laquinda returns today for follow-up of her estrogen receptor positive breast cancer. She was last seen here on 02/19/2018.  She is accompanied by her daughter  We have not seen the Lucita Ferrara for the past 2 years because of the intervening pandemic and because of difficulties with transportation given that she is essentially bedbound in a nursing home  The patient continues on anastrozole.  To the best of my ability to tell and also according to the daughter she is tolerating this with no side effects.  Since her last visit, she completed radiation therapy on 03/01/2018.  She is followed by Lonia Chimera and has recently been noted to have a decrease in physical and mental status.   REVIEW OF SYSTEMS: Charlane recently had an episode of aspiration pneumonia, had a significant decline, and hospice was contacted.  She continues to be followed by palliative care.  However she rallied and more recently there was a question whether there might be some cervical adenopathy suggesting breast cancer progression.  The patient today when I asked her what is been happening says "nothing".   COVID 19 VACCINATION STATUS: Moderna x2, status post booster x1   HISTORY OF CURRENT ILLNESS: From the original intake note:  Meilyn D Emme was  found to have a palpable mass in the left breast. She underwent bilateral diagnostic mammography with tomography and left breast ultrasonography at [facility] on 08/31/2017 showing: breast density category C. There was a suspicious mass in the left breast at the 9 o'clock retroareolar location involving the medial aspect of the nipple, measuring 2.8 x 1.6 x 1.0 cm. Sonographic evaluation of the left axilla demonstrates no suspicious lymphadenopathy. Limited mammographic evaluation of the right breast is unremarkable.  Accordingly on 09/05/2017 she proceeded to biopsy of the left breast area in question. The pathology from this procedure showed ([id number]): Invasive ductal carcinoma grade 2. Perineural invasion is identified. Prognostic indicators significant for: estrogen receptor, 100% positive and progesterone receptor, 80% positive, both with strong staining intensity. Proliferation marker Ki67 at 15%. HER2 not amplified with ratios HER2/CEP17 signals 1.70 and average HER2 copies per cell 4.75.   The patient's subsequent history is as detailed below.   PAST MEDICAL HISTORY: Past Medical History:  Diagnosis Date   Cancer ([place])    BREAST CANCER   CHF (congestive heart failure) (HCC)    DM (diabetes mellitus), secondary, with peripheral vascular complications (HCC)    Hyperlipidemia    Hypertension    Schizoaffective disorder ([place])    Stroke ([place])   Polyosteoarthrtis and generalized muscle weakness.  Diabetes- takes metformin   PAST SURGICAL HISTORY: Past Surgical History:  Procedure Laterality Date   CHOLECYSTECTOMY     2016   MASTECTOMY COMPLETE / SIMPLE Left 10/30/2017   SIMPLE MASTECTOMY WITH AXILLARY SENTINEL NODE BIOPSY Left 10/30/2017   Procedure: LEFT SIMPLE MASTECTOMY;  Surgeon: Erroll Luna, MD;  Location: [facility];  Service: General;  Laterality: Left;   Cholecystectomy 2016   FAMILY HISTORY No family history on file. The patient's father died of old age at 43.  The patient's mother also died of old age at 78. The patient has 5 brothers and 6 sisters. The patient denies a family history of breast or ovarian cancer.   GYNECOLOGIC HISTORY:  No LMP recorded. Patient is postmenopausal. Age at first live birth: 80 years old She is [place] P4 (the last was still born).  Her LMP was age [age]. She never took HRT or oral contraception.    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 She has lived in a nursing home since having a stroke in 1996. The patient is widowed. The patient's daughter, [name] lives in [place], [place] and works as a Freight forwarder. The patient's son, [name] lives in [place], [place] and works as a Administrator. The patient's daughter, [name] lives in [place] and works as a Customer service manager. The patient as 3 grandchildren. She is a Psychologist, forensic.     ADVANCED DIRECTIVES: The patient's daughter, [name] is her HCPOA, and she can be reached at [phone]   HEALTH MAINTENANCE: Social History   Tobacco Use   Smoking status: Never   Smokeless tobacco: Never  Vaping Use   Vaping Use: Never used  Substance Use Topics   Alcohol use: Not Currently   Drug use: Never        Allergies  Allergen Reactions   Atorvastatin     UNSPECIFIED REACTION     Current Outpatient Medications  Medication Sig Dispense Refill   acetaminophen (TYLENOL) 500 MG tablet Take 500 mg by mouth every 4 (four) hours as needed.     alum & mag hydroxide-simeth (MYLANTA) 200-200-20 MG/5ML suspension Take 30 mLs by mouth every 4 (four) hours as needed for indigestion or heartburn.     anastrozole (ARIMIDEX) 1 MG tablet Take 1 tablet (1 mg total) by mouth daily. 90 tablet 4   aspirin EC 81 MG tablet Take 81 mg by mouth daily.     bisacodyl (DULCOLAX) 10 MG suppository Place 10 mg rectally as needed for moderate constipation.     camphor-menthol (SARNA) lotion Apply 1 application topically 2 (two) times daily as needed for itching.     Cholecalciferol (VITAMIN D3) 25 MCG (1000  UT) CAPS Take 1 capsule by mouth daily.     divalproex (DEPAKOTE ER) 500 MG 24 hr tablet Take 500 mg by mouth daily.      docusate sodium (COLACE) 100 MG capsule Take 100 mg by mouth 2 (two) times daily. For constipation     magnesium hydroxide (MILK OF MAGNESIA) 400 MG/5ML suspension Take 30 mLs by mouth daily as needed for mild constipation.     metFORMIN (GLUCOPHAGE) 500 MG tablet Take 500 mg by mouth daily.      OLANZapine (ZYPREXA) 5 MG tablet Take 1 tablet (5 mg total) by mouth at bedtime. 30 tablet 0   oxycodone (OXY-IR) 5 MG capsule Take 5 mg by mouth every 4 (four) hours as needed for pain.     polyethylene glycol (MIRALAX / GLYCOLAX) packet Take 17 g by mouth daily.     sennosides-docusate sodium (SENOKOT-S) 8.6-50 MG tablet Take 1 tablet by mouth at bedtime.     Sodium Phosphates (RA SALINE ENEMA) 19-7 GM/118ML ENEM Place 1 each rectally as needed (for constipation).     vitamin B-12 (CYANOCOBALAMIN) 500 MCG tablet Take 500 mcg by mouth daily.     No current facility-administered medications  for this visit.    OBJECTIVE: Older African-American woman examined in a gurney Vitals:   08/16/20 1254  BP: 123/65  Pulse: 88  Resp: 16  Temp: 97.9 F (36.6 C)  SpO2: 100%      Body mass index is 31.64 kg/m.   Wt Readings from Last 3 Encounters:  02/03/20 196 lb (88.9 kg)  10/14/19 196 lb 6.4 oz (89.1 kg)  07/22/19 194 lb 12.8 oz (88.4 kg)  ECOG FS:2 - Symptomatic, <50% confined to bed  Sclerae unicteric, EOMs intact No cervical or supraclavicular adenopathy noted Lungs no rales or rhonchi Heart regular rate and rhythm Abd soft, nontender, positive bowel sounds Neuro: Pleasant, cooperative, minimally verbal but was able to tell me that my hands were very cold Breasts: I do not palpate a mass in the right breast.  The left breast is status post mastectomy.  There is no evidence of chest wall recurrence.  Both axillae are benign.   LAB RESULTS:  CMP     Component Value  Date/Time   NA 144 10/01/2019 0000   K 4.5 10/01/2019 0000   CL 105 10/01/2019 0000   CO2 26 (A) 10/01/2019 0000   GLUCOSE 111 (H) 02/19/2018 1221   BUN 11 10/01/2019 0000   CREATININE 0.6 10/01/2019 0000   CREATININE 0.77 02/19/2018 1221   CREATININE 0.90 09/12/2017 1132   CALCIUM 10.4 10/01/2019 0000   PROT 7.5 02/19/2018 1221   ALBUMIN 3.9 10/01/2019 0000   AST 19 10/01/2019 0000   AST 17 09/12/2017 1132   ALT 17 10/01/2019 0000   ALT 26 09/12/2017 1132   ALKPHOS 123 10/01/2019 0000   BILITOT 0.3 02/19/2018 1221   BILITOT 0.5 09/12/2017 1132   GFRNONAA 87.70 10/01/2019 0000   GFRNONAA >60 09/12/2017 1132   GFRAA 90 10/01/2019 0000   GFRAA >60 09/12/2017 1132    No results found for: TOTALPROTELP, ALBUMINELP, A1GS, A2GS, BETS, BETA2SER, GAMS, MSPIKE, SPEI  No results found for: KPAFRELGTCHN, LAMBDASER, KAPLAMBRATIO  Lab Results  Component Value Date   WBC 7.3 10/01/2019   NEUTROABS 4 10/01/2019   HGB 14.7 10/01/2019   HCT 46 10/01/2019   MCV 96.4 02/19/2018   PLT 207 10/01/2019    No results found for: LABCA2  No components found for: WCBJSE831  No results for input(s): INR in the last 168 hours.  No results found for: LABCA2  No results found for: DVV616  No results found for: WVP710  No results found for: GYI948  No results found for: CA2729  No components found for: HGQUANT  No results found for: CEA1 / No results found for: CEA1   No results found for: AFPTUMOR  No results found for: CHROMOGRNA  No results found for: TOTALPROTELP, ALBUMINELP, A1GS, A2GS, BETS, BETA2SER, GAMS, MSPIKE, SPEI (this displays SPEP labs)  No results found for: KPAFRELGTCHN, LAMBDASER, KAPLAMBRATIO (kappa/lambda light chains)  No results found for: HGBA, HGBA2QUANT, HGBFQUANT, HGBSQUAN (Hemoglobinopathy evaluation)   No results found for: LDH  Lab Results  Component Value Date   IRON 69 02/19/2019   TIBC 335 02/19/2019   IRONPCTSAT 20.60 02/19/2019    (Iron and TIBC)  Lab Results  Component Value Date   FERRITIN 232.60 02/19/2019    Urinalysis    Component Value Date/Time   COLORURINE YELLOW 01/08/2018 0012   APPEARANCEUR CLEAR 01/08/2018 0012   LABSPEC 1.020 01/08/2018 0012   PHURINE 5.0 01/08/2018 0012   GLUCOSEU NEGATIVE 01/08/2018 0012   HGBUR NEGATIVE 01/08/2018 0012  BILIRUBINUR NEGATIVE 01/08/2018 0012   KETONESUR 20 (A) 01/08/2018 0012   PROTEINUR NEGATIVE 01/08/2018 0012   NITRITE NEGATIVE 01/08/2018 0012   LEUKOCYTESUR SMALL (A) 01/08/2018 0012    STUDIES: No results found.   ELIGIBLE FOR AVAILABLE RESEARCH PROTOCOL: no  ASSESSMENT: 80 y.o. [facility] skilled nursing facility resident status post left breast upper outer quadrant biopsy 09/05/2017 for a clinical T2N0, stage Ib invasive ductal carcinoma, grade 2, estrogen and progesterone receptor positive, HER-2 not amplified, with an MIB-1 of 15%.  (1) status post left mastectomy 10/30/2017, for a pT4 pN1, stage IIIB invasive ductal carcinoma, grade 2, with lymphovascular invasion and involvement of the nipple and epidermis of the areola, but with negative margins  (a) 1 of 2 non-sentinel lymph nodes removed positive straight capsular extension  (2) anastrozole started on 12/12/2017, to be continued a total of 7 years  (3) Adjuvant radiation 01/08/2018 - 03/01/2018  (a) The patient initially received a dose of 50.4 Gy in 28 fractions to the left chest wall and supraclavicular region. This was delivered using a 3-D conformal, 4 field technique. The patient then received a boost to the mastectomy scar. This delivered an additional 10 Gy in 5 fractions using an en face electron field. The total dose was 60.4 Gy.  (4) status post remote CVA, followed by [facility] care /palliative care   PLAN: Margery will soon be 3 years out from definitive surgery for her breast cancer with no evidence of disease recurrence.  This is favorable.  She is tolerating anastrozole well  and the plan is to continue that a total of 7 years.  Nevertheless the Lucita Ferrara is at high risk for local or systemic recurrence given t that she had stage IIIb disease at the time of her surgery.  With the patient's functional limitations and her recent experience after aspiration pneumonia I concur with the DNR order and this was discussed with the patient's daughter.  We also discussed how aggressive to be in terms of follow-up.  I think it would be very difficult if not impossible for Verl to have mammography yearly and equally difficult to do scans, which would not in any case be indicated in the absence of specific symptoms to evaluate.    Accordingly the plan is for continuing follow-up on an every 1-month basis which can be done virtually.  The patient's daughter will be present at the patient's bedside for the next visit, 03/22/2021 at 12:30 PM and she can update [place] on any concerns and also physically uncover the patient's chest so that we can at least do a visual inspection.  We will of course be glad to evaluate the Lucita Ferrara in person at any time on an as needed basis  Total encounter time 25 minutes.*  Annalysa Mohammad, Virgie Dad, MD  08/16/20 1:19 PM Medical Oncology and Hematology [facility] [place], [place] [address] Tel. [phone]    Fax. [phone]   I, Wilburn Mylar, am acting as scribe for Dr. Virgie Dad. Olina Melfi.  I, Lurline Del MD, have reviewed the above documentation for accuracy and completeness, and I agree with the above.    *Total Encounter Time as defined by the Centers for Medicare and Medicaid Services includes, in addition to the face-to-face time of a patient visit (documented in the note above) non-face-to-face time: obtaining and reviewing outside history, ordering and reviewing medications, tests or procedures, care coordination (communications with other health care professionals or caregivers) and documentation in the medical  record.

## 2020-08-18 ENCOUNTER — Telehealth: Payer: Self-pay | Admitting: Oncology

## 2020-08-18 NOTE — Telephone Encounter (Signed)
Scheduled per 7/11 los. Called pts daughter and left a msg

## 2020-09-07 ENCOUNTER — Non-Acute Institutional Stay (SKILLED_NURSING_FACILITY): Payer: Medicare Other | Admitting: Internal Medicine

## 2020-09-07 ENCOUNTER — Encounter: Payer: Self-pay | Admitting: Internal Medicine

## 2020-09-07 DIAGNOSIS — F01518 Vascular dementia, unspecified severity, with other behavioral disturbance: Secondary | ICD-10-CM

## 2020-09-07 DIAGNOSIS — F0151 Vascular dementia with behavioral disturbance: Secondary | ICD-10-CM | POA: Diagnosis not present

## 2020-09-07 DIAGNOSIS — R627 Adult failure to thrive: Secondary | ICD-10-CM

## 2020-09-07 DIAGNOSIS — D649 Anemia, unspecified: Secondary | ICD-10-CM

## 2020-09-07 NOTE — Assessment & Plan Note (Signed)
Continued deterioration in context of adult failure to thrive Complains of dysuria but UA neg and constipation yet Staff reports frequent BMs

## 2020-09-07 NOTE — Patient Instructions (Signed)
See assessment and plan under each diagnosis in the problem list and acutely for this visit 

## 2020-09-07 NOTE — Assessment & Plan Note (Addendum)
06/28/20 H/H 9/27.9 w normochromic /normocytic indices but iron 19 (37-145);TIBC 106 ( 250-400) Ferritin supranormal @ 788.30; B12 > 2000 On low dose B12 supplement Not on iron; this will be discussed w Erick Blinks NP

## 2020-09-07 NOTE — Progress Notes (Signed)
   NURSING HOME LOCATION:  [facility] ROOM NUMBER:  [id number]  CODE STATUS:  DNR  PCP:  Lorette Ang [facility] NP  This is a nursing facility follow up visit of chronic medical diagnoses & to document compliance with Regulation 483.30 (c) in [facility] [place] Manual Phase 2 which mandates caregiver visit ( visits can alternate among physician, PA or NP as per statutes) within 10 days of 30 days / 60 days/ 90 days post admission to SNF date    Interim medical record and care since last SNF visit was updated with review of diagnostic studies and change in clinical status since last visit were documented.  HPI: She is a permanent resident at this facility with history of breast cancer, history congestive heart failure, diabetes with peripheral vascular disease, dyslipidemia, essential hypertension, history of stroke, and schizoaffective disorder. Surgeries and procedures include simple mastectomy with axillary sentinel node biopsy and cholecystectomy.  Review of systems: Review of systems could not be completed as she is essentially nonverbal.  Her only complaint was "constipation, constipation".  This is not documented by staff reports. She has been moved to another floor for one on one feeding with improved nutritional intake. Recently UA was performed because of dysuria ; it was negative.  Physical exam:  Pertinent or positive findings: She was asleep when I entered the room.  She did arouse only to open her eyes but unable to follow commands.  Verbalization was minimal and was only the word "constipation" repeated twice.   She is wearing only the upper plate.  She has low-grade rales bilaterally.  Heart sounds are distant.  Pedal pulses are palpable.  When the right lower extremity was touched the right toe was upgoing.  She exhibits an an intermittent rolling side to side tremor of the left upper extremity greater than the right. She would not oppose my hand to test  strength in any extremity.  Lymphatic: No lymphadenopathy about the head, neck, axilla. Eyes: No conjunctival inflammation or lid edema is present. There is no scleral icterus. Ears:  External ear exam shows no significant lesions or deformities.   Nose:  External nasal examination shows no deformity or inflammation. Nasal mucosa are pink and moist without lesions, exudates Neck:  No thyromegaly, masses, tenderness noted.    Heart:  No gallop, murmur, click, rub .  Lungs:  without wheezes, rhonchi,  rubs. Abdomen: Bowel sounds are normal. Abdomen is soft and nontender with no organomegaly, hernias, masses. GU: Deferred  Extremities:  No cyanosis, clubbing, edema  Neurologic exam :Balance, Rhomberg, finger to nose testing could not be completed due to clinical state Skin: Warm & dry w/o tenting. No significant lesions or rash.  See summary under each active problem in the Problem List with associated updated therapeutic plan

## 2020-09-07 NOTE — Assessment & Plan Note (Addendum)
Diet changed to pureed; Stabilization clinically after Staff began feeding her ; but prognosis very poor & DNR appropriate

## 2020-11-01 LAB — HEPATIC FUNCTION PANEL
ALT: 142 — AB (ref 7–35)
AST: 83 — AB (ref 13–35)
Alkaline Phosphatase: 435 — AB (ref 25–125)

## 2020-11-01 LAB — BASIC METABOLIC PANEL
BUN: 16 (ref 4–21)
CO2: 28 — AB (ref 13–22)
Chloride: 103 (ref 99–108)
Creatinine: 0.6 (ref 0.5–1.1)
Glucose: 80
Potassium: 4.4 (ref 3.4–5.3)
Sodium: 138 (ref 137–147)

## 2020-11-01 LAB — COMPREHENSIVE METABOLIC PANEL
Albumin: 3.5 (ref 3.5–5.0)
Calcium: 9.6 (ref 8.7–10.7)
GFR calc Af Amer: >90
GFR calc non Af Amer: 88.49
Globulin: 3.2

## 2020-11-29 ENCOUNTER — Other Ambulatory Visit: Payer: Self-pay | Admitting: Internal Medicine

## 2021-01-03 LAB — BASIC METABOLIC PANEL
BUN: 18 (ref 4–21)
CO2: 23 — AB (ref 13–22)
Chloride: 104 (ref 99–108)
Creatinine: 0.7 (ref 0.5–1.1)
Glucose: 108
Potassium: 3.6 (ref 3.4–5.3)
Sodium: 137 (ref 137–147)

## 2021-01-03 LAB — HEPATIC FUNCTION PANEL
ALT: 134 — AB (ref 7–35)
AST: 119 — AB (ref 13–35)
Alkaline Phosphatase: 563 — AB (ref 25–125)
Bilirubin, Total: 2.8

## 2021-01-03 LAB — COMPREHENSIVE METABOLIC PANEL
Albumin: 3.1 — AB (ref 3.5–5.0)
Calcium: 8.9 (ref 8.7–10.7)

## 2021-01-03 LAB — CBC AND DIFFERENTIAL
HCT: 37 (ref 36–46)
Hemoglobin: 12.1 (ref 12.0–16.0)
Platelets: 280 (ref 150–399)
WBC: 20

## 2021-01-03 LAB — CBC: RBC: 3.88 (ref 3.87–5.11)

## 2021-01-25 ENCOUNTER — Other Ambulatory Visit: Payer: Self-pay

## 2021-01-25 ENCOUNTER — Non-Acute Institutional Stay: Payer: Medicare Other | Admitting: Hospice

## 2021-01-25 DIAGNOSIS — R1310 Dysphagia, unspecified: Secondary | ICD-10-CM

## 2021-01-25 DIAGNOSIS — Z515 Encounter for palliative care: Secondary | ICD-10-CM

## 2021-01-25 DIAGNOSIS — R5381 Other malaise: Secondary | ICD-10-CM

## 2021-01-25 NOTE — Progress Notes (Signed)
[facility] Palliative Care Consult Note Telephone: [phone]  Fax: [phone]  PATIENT NAME: Carolyn Parrish DOB: 14-Jan-1941 MRN: [id number]  PRIMARY CARE PROVIDER:   Hendricks Limes, MD Hendricks Limes, MD [address] [place],  [place] [address]  REFERRING PROVIDER: Hendricks Limes, MD Hendricks Limes, MD [address] [place],  [place] [address]  RESPONSIBLE PARTY: Extended Emergency Contact Information Primary Emergency Contact: Carolyn, Parrish Mobile Phone: [phone] Relation: Daughter   Contact Information     Name Relation Home Work Mobile   Carolyn, Parrish Daughter   [phone]       Visit is to build trust and highlight Palliative Medicine as specialized medical care for people living with serious illness, aimed at facilitating better quality of life through symptoms relief, assisting with advance care planning and complex medical decision making.   RECOMMENDATIONS/PLAN:   Advance Care Planning/Code Status: Patient's CODE STATUS has changed she is now a DO NOT RESUSCITATE.  Goals of Care: Goals of care include to maximize quality of life and symptom management.  MOST selections include limited additional intervention, IV fluids for defined trial period, antibiotics if indicated, feeding tube for defined trial period.  Carolyn Parrish said family is open to making sure patient is comfortable but is not ready for hospice care at this time. Palliative care team will continue to support patient, patient's family, and medical team.  Symptom management/Plan:  Physical debility: secondary to CVA. Continue out of bed, restorative exercises and socialization.    Dysphagia: Continue pured diet as ordered.  Aspiration precautions discussed. ST consult as needed Low back pain: Managed with Tylenol and oxycodone.   Repositioning for comfort.  Dementia: Fast 7b, bedbound, incontinent of bowel and bladder and limited communication ability.  Fall precautions.  Follow up: Palliative care will continue to follow for complex medical decision making, advance care planning, and clarification of goals. Return 6 weeks or prn. Encouraged to call provider sooner with any concerns.  CHIEF COMPLAINT: Palliative follow up visit/physical debility  HISTORY OF PRESENT ILLNESS:  Carolyn Parrish a 80 y.o. female with multiple medical problems including physical debility secondary to CVA. Marland Kitchen History of breast cancer, CAD, DM2, schizophrenia.  Patient is not as interactive as before, shook her head when asked if she was in pain, not able to engage in meaningful conversation. FLACC 0. Nursing with no concerns.  History obtained from review of EMR, discussion with primary team, family and/or patient. Records reviewed and summarized above. All 10 point systems reviewed and are negative except as documented in history of present illness above  Review and summarization of Epic records shows history from other than patient.   Palliative Care was asked to follow this patient o help address complex decision making in the context of advance care planning and goals of care clarification.   PPS: 30%  PHYSICAL EXAM  General: In no acute distress, appropriately dressed, resting in bed Cardiovascular: regular rate and rhythm; no edema in BLE Pulmonary: no cough, no increased work of breathing, normal respiratory effort Abdomen: soft, non tender, no guarding, positive bowel sounds in all quadrants GU:  no suprapubic tenderness Eyes: Normal lids, no discharge ENMT: Moist mucous membranes Musculoskeletal:  weakness, bedbound Skin: no rash to visible skin, warm without cyanosis,  Psych: non-anxious affect Neurological: Weakness but otherwise non focal, forgetful, not able to engage in meaningful conversation Heme/lymph/immuno: no bruises, no bleeding  PERTINENT MEDICATIONS:  Outpatient Encounter Medications as of 01/25/2021  Medication Sig   acetaminophen  (TYLENOL) 500 MG tablet Take 500 mg by mouth every 4 (four) hours as needed.   alum & mag hydroxide-simeth (MAALOX/MYLANTA) 200-200-20 MG/5ML suspension Take 30 mLs by mouth every 4 (four) hours as needed for indigestion or heartburn.   Amino Acids-Protein Hydrolys (FEEDING SUPPLEMENT, PRO-STAT SUGAR FREE 64,) LIQD Take 30 mLs by mouth 3 (three) times daily with meals.   anastrozole (ARIMIDEX) 1 MG tablet Take 1 tablet (1 mg total) by mouth daily.   aspirin EC 81 MG tablet Take 81 mg by mouth daily.   bisacodyl (DULCOLAX) 10 MG suppository Place 10 mg rectally as needed for moderate constipation.   camphor-menthol (SARNA) lotion Apply 1 application topically 2 (two) times daily as needed for itching.   Cholecalciferol (VITAMIN D3) 25 MCG (1000 UT) CAPS Take 1 capsule by mouth daily.   DIVALPROEX SODIUM ER PO Take 125 mg by mouth daily. Give 4 capsules by mouth daily (500 mg)   docusate sodium (COLACE) 100 MG capsule Take 100 mg by mouth 2 (two) times daily. For constipation   magnesium hydroxide (MILK OF MAGNESIA) 400 MG/5ML suspension Take 30 mLs by mouth daily as needed for mild constipation.   Nutritional Supplements (ENSURE ENLIVE PO) Take 237 mLs by mouth 4 (four) times daily.   OLANZapine (ZYPREXA) 5 MG tablet Take 1 tablet (5 mg total) by mouth at bedtime.   oxyCODONE (OXY IR/ROXICODONE) 5 MG immediate release tablet Take 5 mg by mouth every 4 (four) hours as needed for pain.   polyethylene glycol (MIRALAX / GLYCOLAX) packet Take 17 g by mouth daily.   sennosides-docusate sodium (SENOKOT-S) 8.6-50 MG tablet Take 1 tablet by mouth at bedtime.   Sodium Phosphates (RA SALINE ENEMA) 19-7 GM/118ML ENEM Place 1 each rectally as needed (for constipation).   vitamin B-12 (CYANOCOBALAMIN) 500 MCG tablet Take 500 mcg by mouth daily.   No facility-administered encounter medications on file as of 01/25/2021.    HOSPICE ELIGIBILITY/DIAGNOSIS: TBD  PAST MEDICAL HISTORY:  Past Medical History:   Diagnosis Date   Cancer ([place])    BREAST CANCER   CHF (congestive heart failure) (HCC)    DM (diabetes mellitus), secondary, with peripheral vascular complications (HCC)    Hyperlipidemia    Hypertension    Schizoaffective disorder ([place])    Stroke ([place])       ALLERGIES:  Allergies  Allergen Reactions   Atorvastatin     UNSPECIFIED REACTION      I spent 35 minutes providing this consultation; time includes spent with patient/family, chart review and documentation. More than 50% of the time in this consultation was spent on care coordination Thank you for the opportunity to participate in the care of Carolyn Parrish Please call our office at [phone] if we can be of additional assistance.  Note: Portions of this note were generated with Lobbyist. Dictation errors may occur despite best attempts at proofreading.  Teodoro Spray, NP

## 2021-03-22 ENCOUNTER — Encounter: Payer: Self-pay | Admitting: Hematology and Oncology

## 2021-03-22 ENCOUNTER — Inpatient Hospital Stay: Payer: Commercial Managed Care - HMO | Attending: Hematology and Oncology | Admitting: Hematology and Oncology

## 2021-03-22 DIAGNOSIS — Z170 Estrogen receptor positive status [ER+]: Secondary | ICD-10-CM

## 2021-03-22 DIAGNOSIS — C50912 Malignant neoplasm of unspecified site of left female breast: Secondary | ICD-10-CM | POA: Diagnosis not present

## 2021-03-22 DIAGNOSIS — C50212 Malignant neoplasm of upper-inner quadrant of left female breast: Secondary | ICD-10-CM

## 2021-03-22 NOTE — Progress Notes (Signed)
Carolyn Parrish  Telephone:([phone] Fax:([phone]     ID: CYLAH FANNIN DOB: 1940-08-28  MR#: [id number]  LID#:[id number]  Patient Care Team: Hendricks Limes, MD as PCP - General (Internal Medicine) Erroll Luna, MD as Consulting Physician (General Surgery) Magrinat, Virgie Dad, MD (Inactive) as Consulting Physician (Oncology) Kyung Rudd, MD as Consulting Physician (Radiation Oncology) Charise Killian, Lavonna Monarch, NP as Nurse Practitioner (Family Medicine) OTHER MD:   CHIEF COMPLAINT: Estrogen receptor positive breast cancer  CURRENT TREATMENT: anastrozole   INTERVAL HISTORY: Patient is scheduled for telephone visit.  Daughter answered the phone call.  She tells that mom speech is hard to understand given her previous stroke and she provided most of the history.  According to daughter, mom has been slowly declining, has been losing weight over the past 1 year or so.  She has not been eating as much because of difficulty swallowing.  She mostly eats softer diet.  She denies any issues with anastrozole or any breast cancer related issues but she is overall worried about mom's decline.  According to daughter, she is mostly bedbound, will be moved to the chair about 3 times a week but sometimes she does not even like to do that.  Hence it is very difficult to do any mammograms or in person visits.  REVIEW OF SYSTEMS:    COVID 19 VACCINATION STATUS: Moderna x2, status post booster x1   HISTORY OF CURRENT ILLNESS: From the original intake note:  Carolyn Parrish was found to have a palpable mass in the left breast. She underwent bilateral diagnostic mammography with tomography and left breast ultrasonography at [facility] on 08/31/2017 showing: breast density category C. There was a suspicious mass in the left breast at the 9 o'clock retroareolar location involving the medial aspect of the nipple, measuring 2.8 x 1.6 x 1.0 cm. Sonographic evaluation of the left  axilla demonstrates no suspicious lymphadenopathy. Limited mammographic evaluation of the right breast is unremarkable.  Accordingly on 09/05/2017 she proceeded to biopsy of the left breast area in question. The pathology from this procedure showed ([id number]): Invasive ductal carcinoma grade 2. Perineural invasion is identified. Prognostic indicators significant for: estrogen receptor, 100% positive and progesterone receptor, 80% positive, both with strong staining intensity. Proliferation marker Ki67 at 15%. HER2 not amplified with ratios HER2/CEP17 signals 1.70 and average HER2 copies per cell 4.75.   The patient's subsequent history is as detailed below.   PAST MEDICAL HISTORY: Past Medical History:  Diagnosis Date   Cancer ([place])    BREAST CANCER   CHF (congestive heart failure) (HCC)    DM (diabetes mellitus), secondary, with peripheral vascular complications (HCC)    Hyperlipidemia    Hypertension    Schizoaffective disorder ([place])    Stroke ([place])   Polyosteoarthrtis and generalized muscle weakness.  Diabetes- takes metformin   PAST SURGICAL HISTORY: Past Surgical History:  Procedure Laterality Date   CHOLECYSTECTOMY     2016   MASTECTOMY COMPLETE / SIMPLE Left 10/30/2017   SIMPLE MASTECTOMY WITH AXILLARY SENTINEL NODE BIOPSY Left 10/30/2017   Procedure: LEFT SIMPLE MASTECTOMY;  Surgeon: Erroll Luna, MD;  Location: [facility];  Service: General;  Laterality: Left;   Cholecystectomy 2016   FAMILY HISTORY No family history on file. The patient's father died of old age at 62. The patient's mother also died of old age at 24. The patient has 5 brothers and 6 sisters. The patient denies a family history of breast or ovarian cancer.  GYNECOLOGIC HISTORY:  No LMP recorded. Patient is postmenopausal. Age at first live birth: 81 years old She is [place] P4 (the last was still born).  Her LMP was age [age]. She never took HRT or oral contraception.    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 She has lived in a nursing home since having a stroke in 1996. The patient is widowed. The patient's daughter, [name] lives in [place], [place] and works as a Freight forwarder. The patient's son, [name] lives in [place], [place] and works as a Administrator. The patient's daughter, [name] lives in [place] and works as a Customer service manager. The patient as 3 grandchildren. She is a Psychologist, forensic.     ADVANCED DIRECTIVES: The patient's daughter, [name] is her HCPOA, and she can be reached at [phone]   HEALTH MAINTENANCE: Social History   Tobacco Use   Smoking status: Never   Smokeless tobacco: Never  Vaping Use   Vaping Use: Never used  Substance Use Topics   Alcohol use: Not Currently   Drug use: Never        Allergies  Allergen Reactions   Atorvastatin     UNSPECIFIED REACTION     Current Outpatient Medications  Medication Sig Dispense Refill   acetaminophen (TYLENOL) 500 MG tablet Take 500 mg by mouth every 4 (four) hours as needed.     alum & mag hydroxide-simeth (MAALOX/MYLANTA) 200-200-20 MG/5ML suspension Take 30 mLs by mouth every 4 (four) hours as needed for indigestion or heartburn.     Amino Acids-Protein Hydrolys (FEEDING SUPPLEMENT, PRO-STAT SUGAR FREE 64,) LIQD Take 30 mLs by mouth 3 (three) times daily with meals.     anastrozole (ARIMIDEX) 1 MG tablet Take 1 tablet (1 mg total) by mouth daily. 90 tablet 4   aspirin EC 81 MG tablet Take 81 mg by mouth daily.     bisacodyl (DULCOLAX) 10 MG suppository Place 10 mg rectally as needed for moderate constipation.     camphor-menthol (SARNA) lotion Apply 1 application topically 2 (two) times daily as needed for itching.     Cholecalciferol (VITAMIN D3) 25 MCG (1000 UT) CAPS Take 1 capsule by mouth daily.     DIVALPROEX SODIUM ER PO Take 125 mg by mouth daily. Give 4 capsules by mouth daily (500 mg)     docusate sodium (COLACE) 100 MG capsule Take 100 mg by mouth 2 (two) times daily. For constipation     magnesium hydroxide  (MILK OF MAGNESIA) 400 MG/5ML suspension Take 30 mLs by mouth daily as needed for mild constipation.     Nutritional Supplements (ENSURE ENLIVE PO) Take 237 mLs by mouth 4 (four) times daily.     OLANZapine (ZYPREXA) 5 MG tablet Take 1 tablet (5 mg total) by mouth at bedtime. 30 tablet 0   oxyCODONE (OXY IR/ROXICODONE) 5 MG immediate release tablet Take 5 mg by mouth every 4 (four) hours as needed for pain.     polyethylene glycol (MIRALAX / GLYCOLAX) packet Take 17 g by mouth daily.     sennosides-docusate sodium (SENOKOT-S) 8.6-50 MG tablet Take 1 tablet by mouth at bedtime.     Sodium Phosphates (RA SALINE ENEMA) 19-7 GM/118ML ENEM Place 1 each rectally as needed (for constipation).     vitamin B-12 (CYANOCOBALAMIN) 500 MCG tablet Take 500 mcg by mouth daily.     No current facility-administered medications for this visit.    OBJECTIVE:  There were no vitals filed for this visit.     There is  no height or weight on file to calculate BMI.   Wt Readings from Last 3 Encounters:  09/07/20 178 lb 6.4 oz (80.9 kg)  02/03/20 196 lb (88.9 kg)  10/14/19 196 lb 6.4 oz (89.1 kg)  ECOG FS:2 - Symptomatic, <50% confined to bed    LAB RESULTS:  CMP     Component Value Date/Time   NA 137 01/03/2021 0000   K 3.6 01/03/2021 0000   CL 104 01/03/2021 0000   CO2 23 (A) 01/03/2021 0000   GLUCOSE 111 (H) 02/19/2018 1221   BUN 18 01/03/2021 0000   CREATININE 0.7 01/03/2021 0000   CREATININE 0.77 02/19/2018 1221   CREATININE 0.90 09/12/2017 1132   CALCIUM 8.9 01/03/2021 0000   PROT 7.5 02/19/2018 1221   ALBUMIN 3.1 (A) 01/03/2021 0000   AST 119 (A) 01/03/2021 0000   AST 17 09/12/2017 1132   ALT 134 (A) 01/03/2021 0000   ALT 26 09/12/2017 1132   ALKPHOS 563 (A) 01/03/2021 0000   BILITOT 0.3 02/19/2018 1221   BILITOT 0.5 09/12/2017 1132   GFRNONAA 88.49 11/01/2020 0000   GFRNONAA >60 09/12/2017 1132   GFRAA >90 11/01/2020 0000   GFRAA >60 09/12/2017 1132    No results found for:  TOTALPROTELP, ALBUMINELP, A1GS, A2GS, BETS, BETA2SER, GAMS, MSPIKE, SPEI  No results found for: KPAFRELGTCHN, LAMBDASER, KAPLAMBRATIO  Lab Results  Component Value Date   WBC 20.0 01/03/2021   NEUTROABS 4 10/01/2019   HGB 12.1 01/03/2021   HCT 37 01/03/2021   MCV 96.4 02/19/2018   PLT 280 01/03/2021    No results found for: LABCA2  No components found for: [id number]  No results for input(s): INR in the last 168 hours.  No results found for: LABCA2  No results found for: NUU725  No results found for: DGU440  No results found for: HKV425  No results found for: CA2729  No components found for: HGQUANT  No results found for: CEA1 / No results found for: CEA1   No results found for: AFPTUMOR  No results found for: CHROMOGRNA  No results found for: TOTALPROTELP, ALBUMINELP, A1GS, A2GS, BETS, BETA2SER, GAMS, MSPIKE, SPEI (this displays SPEP labs)  No results found for: KPAFRELGTCHN, LAMBDASER, KAPLAMBRATIO (kappa/lambda light chains)  No results found for: HGBA, HGBA2QUANT, HGBFQUANT, HGBSQUAN (Hemoglobinopathy evaluation)   No results found for: LDH  Lab Results  Component Value Date   IRON 69 02/19/2019   TIBC 335 02/19/2019   IRONPCTSAT 20.60 02/19/2019   (Iron and TIBC)  Lab Results  Component Value Date   FERRITIN 232.60 02/19/2019    Urinalysis    Component Value Date/Time   COLORURINE YELLOW 01/08/2018 0012   APPEARANCEUR CLEAR 01/08/2018 0012   LABSPEC 1.020 01/08/2018 0012   PHURINE 5.0 01/08/2018 0012   GLUCOSEU NEGATIVE 01/08/2018 0012   HGBUR NEGATIVE 01/08/2018 0012   BILIRUBINUR NEGATIVE 01/08/2018 0012   KETONESUR 20 (A) 01/08/2018 0012   PROTEINUR NEGATIVE 01/08/2018 0012   NITRITE NEGATIVE 01/08/2018 0012   LEUKOCYTESUR SMALL (A) 01/08/2018 0012    STUDIES: No results found.   ELIGIBLE FOR AVAILABLE RESEARCH PROTOCOL: no  ASSESSMENT: 81 y.o. [facility] skilled nursing facility resident status post left breast upper outer  quadrant biopsy 09/05/2017 for a clinical T2N0, stage Ib invasive ductal carcinoma, grade 2, estrogen and progesterone receptor positive, HER-2 not amplified, with an MIB-1 of 15%.  (1) status post left mastectomy 10/30/2017, for a pT4 pN1, stage IIIB invasive ductal carcinoma, grade 2, with lymphovascular invasion and involvement of  the nipple and epidermis of the areola, but with negative margins  (a) 1 of 2 non-sentinel lymph nodes removed positive extra capsular extension  (2) anastrozole started on 12/12/2017, to be continued a total of 7 years  (3) Adjuvant radiation 01/08/2018 - 03/01/2018  (a) The patient initially received a dose of 50.4 Gy in 28 fractions to the left chest wall and supraclavicular region. This was delivered using a 3-D conformal, 4 field technique. The patient then received a boost to the mastectomy scar. This delivered an additional 10 Gy in 5 fractions using an en face electron field. The total dose was 60.4 Gy.  (4) status post remote CVA, followed by [facility] care /palliative care   PLAN: Called her daughter and spoke to her.  She is tolerating anastrozole well and Dr Jana Hakim suggested it for 7 yrs. According to daughter, she has not had any new breast cancer related issues but has been slowly declining and losing weight for the past 1 year.  She has lost about 30 pounds or so in the past 1 year.  She has not been eating very well, has a good appetite but cannot swallow everything. Dr Jana Hakim and patient's daughter previously had a discussion about how she cannot do mammograms regularly and how her PS may interfere with being aggressive. Hence they have agreed to virtual visits with her daughter being at bedside. Her last visit with Dr. Jana Hakim was in person and from his evaluation, there is no evidence of recurrence. Today's visit was a telephone visit with her daughter participated, according to daughter, mom speech is hard to understand since she had stroke.  She  is willing to do a video visit for her next visit.  Given the limited connection with the patient, telephone visit, based on the information we were provided, there appears to be no new health concerns from breast cancer standpoint hence we have discussed about continuing current anastrozole and returning to clinic in about 6 months via video visit.  Total encounter time : 20 min  I connected with  Kelena D Schweickert on 03/22/21 by a telephone  application and verified that I am speaking with the correct person using two identifiers.   I discussed the limitations of evaluation and management by telemedicine. The patient expressed understanding and agreed to proceed.   *Total Encounter Time as defined by the Centers for Medicare and Medicaid Services includes, in addition to the face-to-face time of a patient visit (documented in the note above) non-face-to-face time: obtaining and reviewing outside history, ordering and reviewing medications, tests or procedures, care coordination (communications with other health care professionals or caregivers) and documentation in the medical record.

## 2021-03-23 ENCOUNTER — Telehealth: Payer: Self-pay | Admitting: Hematology and Oncology

## 2021-03-23 NOTE — Telephone Encounter (Signed)
Scheduled appointment per 02/15 los. Patient aware.

## 2021-04-01 ENCOUNTER — Non-Acute Institutional Stay: Payer: Medicare Other | Admitting: Hospice

## 2021-04-01 ENCOUNTER — Other Ambulatory Visit: Payer: Self-pay

## 2021-04-01 DIAGNOSIS — R1310 Dysphagia, unspecified: Secondary | ICD-10-CM

## 2021-04-01 DIAGNOSIS — F0390 Unspecified dementia without behavioral disturbance: Secondary | ICD-10-CM

## 2021-04-01 DIAGNOSIS — Z515 Encounter for palliative care: Secondary | ICD-10-CM

## 2021-04-01 DIAGNOSIS — R5381 Other malaise: Secondary | ICD-10-CM

## 2021-04-01 NOTE — Progress Notes (Signed)
[facility] Pall[facility] Consult Note Telephone: [phone]  Fax: [phone]  PATIENT NAME: Carolyn Parrish DOB: 07-09-1940 MRN: [id number]  PRIMARY CARE PROVIDER:   Hendricks Limes, MD Hendricks Limes, [place],  [place] [address]  REFERRING PROVIDER: Hendricks Limes, MD Hendricks Limes, MD [address] [place],  [place] [address]  Linda Sharon [facility] NP  RESPONSIBLE PARTY: Extended Emergency Contact Information Primary Emergency Contact: Khrystina, Bonnes Mobile Phone: [phone] Relation: Daughter   Contact Information     Name Relation Home Work Mobile   Terianne, Thaker Daughter   [phone]       Visit is to build trust and highlight Palliative Medicine as specialized medical care for people living with serious illness, aimed at facilitating better quality of life through symptoms relief, assisting with advance care planning and complex medical decision making. NP called Kennith Gain and updated on patient's status.   RECOMMENDATIONS/PLAN:   Advance Care Planning/Code Status: Patient's CODE STATUS has changed she is now a DO NOT RESUSCITATE.  Goals of Care: Goals of care include to maximize quality of life and symptom management.  MOST selections include limited additional intervention, IV fluids for defined trial period, antibiotics if indicated, feeding tube for defined trial period. Kennith Gain asked  questions on the benefits of hospice service; answers provided to her satisfaction.  She would consider weight to let patient get hospice service in the future Palliative care team will continue to support patient, patient's family, and medical team.  Symptom management/Plan:  Dementia: Fast 7b, impoverished thoughts, limited language, bedbound, incontinent of bowel and bladder and limited communication ability.  Continue ongoing supportive care.  Fall precautions.  Physical debility: secondary to CVA and advanced  dementia. Continue out of bed, restorative exercises and socialization.    Dysphagia: Continue pured diet as ordered.  Aspiration precautions discussed. ST consult as needed Low back pain: Stable.  Managed with Tylenol and oxycodone.   Repositioning for comfort.   Follow up: Palliative care will continue to follow for complex medical decision making, advance care planning, and clarification of goals. Return 6 weeks or prn. Encouraged to call provider sooner with any concerns.  CHIEF COMPLAINT: Palliative follow up visit  HISTORY OF PRESENT ILLNESS:  Carolyn Parrish a 81 y.o. 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 History of breast cancer, CAD, DM2, schizophrenia.  Patient not able to clearly answer simple questions.  FLACC 0.  Collaborative discussion with [facility] NP on patient's likely eligibility to hospice service.  Nursing with no concerns.  History obtained from review of EMR, discussion with primary team, family and/or patient. Records reviewed and summarized above. All 10 point systems reviewed and are negative except as documented in history of present illness above  Review and summarization of Epic records shows history from other than patient.   Palliative Care was asked to follow this patient o help address complex decision making in the context of advance care planning and goals of care clarification.   PPS: 30%  PHYSICAL EXAM  General: In no acute distress, appropriately dressed, resting in bed Cardiovascular: regular rate and rhythm; no edema in BLE Pulmonary: no cough, no increased work of breathing, normal respiratory effort Abdomen: soft, non tender, no guarding, positive bowel sounds in all quadrants GU:  no suprapubic tenderness Eyes: Normal lids, no discharge ENMT: Moist mucous membranes Musculoskeletal:  weakness, bedbound Skin: no rash to visible skin, warm  without cyanosis,  Psych:  non-anxious affect Neurological: Weakness but otherwise non focal, f memory loss/confusion heme/lymph/immuno: no bruises, no bleeding  PERTINENT MEDICATIONS:  Outpatient Encounter Medications as of 04/01/2021  Medication Sig   acetaminophen (TYLENOL) 500 MG tablet Take 500 mg by mouth every 4 (four) hours as needed.   alum & mag hydroxide-simeth (MAALOX/MYLANTA) 200-200-20 MG/5ML suspension Take 30 mLs by mouth every 4 (four) hours as needed for indigestion or heartburn.   Amino Acids-Protein Hydrolys (FEEDING SUPPLEMENT, PRO-STAT SUGAR FREE 64,) LIQD Take 30 mLs by mouth 3 (three) times daily with meals.   anastrozole (ARIMIDEX) 1 MG tablet Take 1 tablet (1 mg total) by mouth daily.   aspirin EC 81 MG tablet Take 81 mg by mouth daily.   bisacodyl (DULCOLAX) 10 MG suppository Place 10 mg rectally as needed for moderate constipation.   camphor-menthol (SARNA) lotion Apply 1 application topically 2 (two) times daily as needed for itching.   Cholecalciferol (VITAMIN D3) 25 MCG (1000 UT) CAPS Take 1 capsule by mouth daily.   DIVALPROEX SODIUM ER PO Take 125 mg by mouth daily. Give 4 capsules by mouth daily (500 mg)   docusate sodium (COLACE) 100 MG capsule Take 100 mg by mouth 2 (two) times daily. For constipation   magnesium hydroxide (MILK OF MAGNESIA) 400 MG/5ML suspension Take 30 mLs by mouth daily as needed for mild constipation.   Nutritional Supplements (ENSURE ENLIVE PO) Take 237 mLs by mouth 4 (four) times daily.   OLANZapine (ZYPREXA) 5 MG tablet Take 1 tablet (5 mg total) by mouth at bedtime.   oxyCODONE (OXY IR/ROXICODONE) 5 MG immediate release tablet Take 5 mg by mouth every 4 (four) hours as needed for pain.   polyethylene glycol (MIRALAX / GLYCOLAX) packet Take 17 g by mouth daily.   sennosides-docusate sodium (SENOKOT-S) 8.6-50 MG tablet Take 1 tablet by mouth at bedtime.   Sodium Phosphates (RA SALINE ENEMA) 19-7 GM/118ML ENEM Place 1 each rectally as needed (for constipation).    vitamin B-12 (CYANOCOBALAMIN) 500 MCG tablet Take 500 mcg by mouth daily.   No facility-administered encounter medications on file as of 04/01/2021.    HOSPICE ELIGIBILITY/DIAGNOSIS: TBD  PAST MEDICAL HISTORY:  Past Medical History:  Diagnosis Date   Cancer ([place])    BREAST CANCER   CHF (congestive heart failure) (HCC)    DM (diabetes mellitus), secondary, with peripheral vascular complications (HCC)    Hyperlipidemia    Hypertension    Schizoaffective disorder ([place])    Stroke ([place])       ALLERGIES:  Allergies  Allergen Reactions   Atorvastatin     UNSPECIFIED REACTION      I spent 55 minutes providing this consultation; time includes spent with patient/family, chart review and documentation. More than 50% of the time in this consultation was spent on care coordination Thank you for the opportunity to participate in the care of Shakiara D Branscomb Please call our office at [phone] if we can be of additional assistance.  Note: Portions of this note were generated with Lobbyist. Dictation errors may occur despite best attempts at proofreading.  Teodoro Spray, NP

## 2021-07-04 ENCOUNTER — Non-Acute Institutional Stay: Payer: Medicare Other | Admitting: Hospice

## 2021-07-04 DIAGNOSIS — Z515 Encounter for palliative care: Secondary | ICD-10-CM

## 2021-07-04 DIAGNOSIS — R1310 Dysphagia, unspecified: Secondary | ICD-10-CM

## 2021-07-04 DIAGNOSIS — F39 Unspecified mood [affective] disorder: Secondary | ICD-10-CM

## 2021-07-04 DIAGNOSIS — F0390 Unspecified dementia without behavioral disturbance: Secondary | ICD-10-CM

## 2021-07-04 NOTE — Progress Notes (Signed)
[facility] Pall[facility] Consult Note Telephone: [phone]  Fax: [phone]  PATIENT NAME: Carolyn Parrish DOB: 01/20/1941 MRN: [id number]  PRIMARY CARE PROVIDER:   Hendricks Limes, MD Hendricks Limes, [place],  [place] [address]  REFERRING PROVIDER: Hendricks Limes, MD Hendricks Limes, MD [address] [place],  [place] [address]  Linda Sharon [facility] NP  RESPONSIBLE PARTY: Extended Emergency Contact Information Primary Emergency Contact: Rejeana, Fadness Mobile Phone: [phone] Relation: Daughter   Contact Information     Name Relation Home Work Mobile   Annaliyah, Willig Daughter   [phone]       Visit is to build trust and highlight Palliative Medicine as specialized medical care for people living with serious illness, aimed at facilitating better quality of life through symptoms relief, assisting with advance care planning and complex medical decision making.  NP called [name] and updated her on visit for which she expressed appreciation.  RECOMMENDATIONS/PLAN:   Advance Care Planning/Code Status: Patient's CODE STATUS has changed she is now a DO NOT RESUSCITATE.  Goals of Care: Goals of care include to maximize quality of life and symptom management.  MOST selections include limited additional intervention, IV fluids for defined trial period, antibiotics if indicated, feeding tube for defined trial period.  Discussion with [name] on prospects of hospice service.  She is not interested in hospice service at this time.  Palliative care team will continue to support patient, patient's family, and medical team.  Symptom management/Plan:  Dementia: Advanced,  FAST 7c, can no longer walk, impoverished thoughts, limited language, bedbound, incontinent of bowel and bladder and limited communication ability.  Continue ongoing supportive care.  Fall precautions.  Mood disorder: Managed with Depakote sprinkles,  Olanzapine.  Psych consult as planned.  Dysphagia: Continue pured diet as ordered.  Aspiration precautions in place. ST consult as needed   Follow up: Palliative care will continue to follow for complex medical decision making, advance care planning, and clarification of goals. Return 6 weeks or prn. Encouraged to call provider sooner with any concerns.  CHIEF COMPLAINT: Palliative follow up visit  HISTORY OF PRESENT ILLNESS:  Carolyn Parrish a 81 y.o. 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 History of breast cancer, CAD, DM2, mood disorder, schizophrenia.  History obtained from review of EMR, discussion with primary team, family and/or patient. Records reviewed and summarized above. All 10 point systems reviewed and are negative except as documented in history of present illness above  Review and summarization of Epic records shows history from other than patient.   Palliative Care was asked to follow this patient o help address complex decision making in the context of advance care planning and goals of care clarification.   PPS: 30%  PHYSICAL EXAM  General: In no acute distress, appropriately dressed, resting in bed, FLACC 0 Cardiovascular: regular rate and rhythm; no edema in BLE Pulmonary: no cough, no increased work of breathing, normal respiratory effort Abdomen: soft, non tender, no guarding, positive bowel sounds in all quadrants GU:  no suprapubic tenderness Eyes: Normal lids, no discharge ENMT: Moist mucous membranes Musculoskeletal:  weakness, bedbound Skin: no rash to visible skin, warm without cyanosis,  Psych: non-anxious affect Neurological: Weakness but otherwise non focal, f memory loss/confusion heme/lymph/immuno: no bruises, no bleeding  PERTINENT MEDICATIONS:  Outpatient Encounter Medications as of 07/04/2021  Medication Sig   acetaminophen (TYLENOL) 500 MG tablet Take 500 mg  by mouth  every 4 (four) hours as needed.   alum & mag hydroxide-simeth (MAALOX/MYLANTA) 200-200-20 MG/5ML suspension Take 30 mLs by mouth every 4 (four) hours as needed for indigestion or heartburn.   Amino Acids-Protein Hydrolys (FEEDING SUPPLEMENT, PRO-STAT SUGAR FREE 64,) LIQD Take 30 mLs by mouth 3 (three) times daily with meals.   anastrozole (ARIMIDEX) 1 MG tablet Take 1 tablet (1 mg total) by mouth daily.   aspirin EC 81 MG tablet Take 81 mg by mouth daily.   bisacodyl (DULCOLAX) 10 MG suppository Place 10 mg rectally as needed for moderate constipation.   camphor-menthol (SARNA) lotion Apply 1 application topically 2 (two) times daily as needed for itching.   Cholecalciferol (VITAMIN D3) 25 MCG (1000 UT) CAPS Take 1 capsule by mouth daily.   DIVALPROEX SODIUM ER PO Take 125 mg by mouth daily. Give 4 capsules by mouth daily (500 mg)   docusate sodium (COLACE) 100 MG capsule Take 100 mg by mouth 2 (two) times daily. For constipation   magnesium hydroxide (MILK OF MAGNESIA) 400 MG/5ML suspension Take 30 mLs by mouth daily as needed for mild constipation.   Nutritional Supplements (ENSURE ENLIVE PO) Take 237 mLs by mouth 4 (four) times daily.   OLANZapine (ZYPREXA) 5 MG tablet Take 1 tablet (5 mg total) by mouth at bedtime.   oxyCODONE (OXY IR/ROXICODONE) 5 MG immediate release tablet Take 5 mg by mouth every 4 (four) hours as needed for pain.   polyethylene glycol (MIRALAX / GLYCOLAX) packet Take 17 g by mouth daily.   sennosides-docusate sodium (SENOKOT-S) 8.6-50 MG tablet Take 1 tablet by mouth at bedtime.   Sodium Phosphates (RA SALINE ENEMA) 19-7 GM/118ML ENEM Place 1 each rectally as needed (for constipation).   vitamin B-12 (CYANOCOBALAMIN) 500 MCG tablet Take 500 mcg by mouth daily.   No facility-administered encounter medications on file as of 07/04/2021.    HOSPICE ELIGIBILITY/DIAGNOSIS: TBD  PAST MEDICAL HISTORY:  Past Medical History:  Diagnosis Date   Cancer ([place])    BREAST CANCER    CHF (congestive heart failure) (HCC)    DM (diabetes mellitus), secondary, with peripheral vascular complications (HCC)    Hyperlipidemia    Hypertension    Schizoaffective disorder ([place])    Stroke ([place])       ALLERGIES:  Allergies  Allergen Reactions   Atorvastatin     UNSPECIFIED REACTION      I spent 45 minutes providing this consultation; time includes spent with patient/family, chart review and documentation. More than 50% of the time in this consultation was spent on care coordination Thank you for the opportunity to participate in the care of Carolyn Parrish Please call our office at [phone] if we can be of additional assistance.  Note: Portions of this note were generated with L[employer]ist. Dictation errors may occur despite best attempts at proofreading.  Teodoro Spray, NP

## 2021-09-20 ENCOUNTER — Inpatient Hospital Stay: Payer: Commercial Managed Care - HMO | Attending: Hematology and Oncology | Admitting: Hematology and Oncology

## 2021-09-20 NOTE — Progress Notes (Deleted)
[facility]  Telephone:(336) [phone] Fax:(336) [phone]     ID: Carolyn Parrish DOB: April 12, 1940  MR#: [id number]  CSN#:[id number]  Patient Care Team: Pcp, No as PCP - General Erroll Luna, MD as Consulting Physician (General Surgery) Magrinat, Virgie Dad, MD (Inactive) as Consulting Physician (Oncology) Kyung Rudd, MD as Consulting Physician (Radiation Oncology) Teodoro Spray, NP as Nurse Practitioner (Family Medicine) OTHER MD:   CHIEF COMPLAINT: Estrogen receptor positive breast cancer  CURRENT TREATMENT: Anastrozole  INTERVAL HISTORY:  Patient is scheduled for telephone visit.   Patient is limited with mobility given her history of stroke hence we only do televisits.  REVIEW OF SYSTEMS:    COVID 19 VACCINATION STATUS: Moderna x2, status post booster x1   HISTORY OF CURRENT ILLNESS: From the original intake note:  Carolyn Parrish was found to have a palpable mass in the left breast. She underwent bilateral diagnostic mammography with tomography and left breast ultrasonography at [facility] on 08/31/2017 showing: breast density category C. There was a suspicious mass in the left breast at the 9 o'clock retroareolar location involving the medial aspect of the nipple, measuring 2.8 x 1.6 x 1.0 cm. Sonographic evaluation of the left axilla demonstrates no suspicious lymphadenopathy. Limited mammographic evaluation of the right breast is unremarkable.  Accordingly on 09/05/2017 she proceeded to biopsy of the left breast area in question. The pathology from this procedure showed ([id number]): Invasive ductal carcinoma grade 2. Perineural invasion is identified. Prognostic indicators significant for: estrogen receptor, 100% positive and progesterone receptor, 80% positive, both with strong staining intensity. Proliferation marker Ki67 at 15%. HER2 not amplified with ratios HER2/CEP17 signals 1.70 and average HER2 copies per cell 4.75.   The patient's  subsequent history is as detailed below.   PAST MEDICAL HISTORY: Past Medical History:  Diagnosis Date   Cancer ([place])    BREAST CANCER   CHF (congestive heart failure) (HCC)    DM (diabetes mellitus), secondary, with peripheral vascular complications (HCC)    Hyperlipidemia    Hypertension    Schizoaffective disorder ([place])    Stroke ([place])   Polyosteoarthrtis and generalized muscle weakness.  Diabetes- takes metformin   PAST SURGICAL HISTORY: Past Surgical History:  Procedure Laterality Date   CHOLECYSTECTOMY     2016   MASTECTOMY COMPLETE / SIMPLE Left 10/30/2017   SIMPLE MASTECTOMY WITH AXILLARY SENTINEL NODE BIOPSY Left 10/30/2017   Procedure: LEFT SIMPLE MASTECTOMY;  Surgeon: Erroll Luna, MD;  Location: [facility];  Service: General;  Laterality: Left;   Cholecystectomy 2016   FAMILY HISTORY No family history on file. The patient's father died of old age at 90. The patient's mother also died of old age at 35. The patient has 5 brothers and 6 sisters. The patient denies a family history of breast or ovarian cancer.   GYNECOLOGIC HISTORY:  No LMP recorded. Patient is postmenopausal. Age at first live birth: 81 years old She is [place] P4 (the last was still born).  Her LMP was age [age]. She never took HRT or oral contraception.    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 She has lived in a nursing home since having a stroke in 1996. The patient is widowed. The patient's daughter, Carolyn Parrish lives in [place], [place] and works as a Freight forwarder. The patient's son, Carolyn Parrish lives in [place], [place] and works as a Administrator. The patient's daughter, Carolyn Parrish lives in [place] and works as a Customer service manager. The patient as 3 grandchildren. She is a  [employer].     ADVANCED DIRECTIVES: The patient's daughter, Carolyn Parrish is her HCPOA, and she can be reached at [phone]   HEALTH MAINTENANCE: Social History   Tobacco Use   Smoking status: Never   Smokeless tobacco: Never  Vaping Use    Vaping Use: Never used  Substance Use Topics   Alcohol use: Not Currently   Drug use: Never        Allergies  Allergen Reactions   Atorvastatin     UNSPECIFIED REACTION     Current Outpatient Medications  Medication Sig Dispense Refill   acetaminophen (TYLENOL) 500 MG tablet Take 500 mg by mouth every 4 (four) hours as needed.     alum & mag hydroxide-simeth (MAALOX/MYLANTA) 200-200-20 MG/5ML suspension Take 30 mLs by mouth every 4 (four) hours as needed for indigestion or heartburn.     Amino Acids-Protein Hydrolys (FEEDING SUPPLEMENT, PRO-STAT SUGAR FREE 64,) LIQD Take 30 mLs by mouth 3 (three) times daily with meals.     anastrozole (ARIMIDEX) 1 MG tablet Take 1 tablet (1 mg total) by mouth daily. 90 tablet 4   aspirin EC 81 MG tablet Take 81 mg by mouth daily.     bisacodyl (DULCOLAX) 10 MG suppository Place 10 mg rectally as needed for moderate constipation.     camphor-menthol (SARNA) lotion Apply 1 application topically 2 (two) times daily as needed for itching.     Cholecalciferol (VITAMIN D3) 25 MCG (1000 UT) CAPS Take 1 capsule by mouth daily.     DIVALPROEX SODIUM ER PO Take 125 mg by mouth daily. Give 4 capsules by mouth daily (500 mg)     docusate sodium (COLACE) 100 MG capsule Take 100 mg by mouth 2 (two) times daily. For constipation     magnesium hydroxide (MILK OF MAGNESIA) 400 MG/5ML suspension Take 30 mLs by mouth daily as needed for mild constipation.     Nutritional Supplements (ENSURE ENLIVE PO) Take 237 mLs by mouth 4 (four) times daily.     OLANZapine (ZYPREXA) 5 MG tablet Take 1 tablet (5 mg total) by mouth at bedtime. 30 tablet 0   oxyCODONE (OXY IR/ROXICODONE) 5 MG immediate release tablet Take 5 mg by mouth every 4 (four) hours as needed for pain.     polyethylene glycol (MIRALAX / GLYCOLAX) packet Take 17 g by mouth daily.     sennosides-docusate sodium (SENOKOT-S) 8.6-50 MG tablet Take 1 tablet by mouth at bedtime.     Sodium Phosphates (RA SALINE  ENEMA) 19-7 GM/118ML ENEM Place 1 each rectally as needed (for constipation).     vitamin B-12 (CYANOCOBALAMIN) 500 MCG tablet Take 500 mcg by mouth daily.     No current facility-administered medications for this visit.    OBJECTIVE:  There were no vitals filed for this visit.     There is no height or weight on file to calculate BMI.   Wt Readings from Last 3 Encounters:  09/07/20 178 lb 6.4 oz (80.9 kg)  02/03/20 196 lb (88.9 kg)  10/14/19 196 lb 6.4 oz (89.1 kg)  ECOG FS:2 - Symptomatic, <50% confined to bed    LAB RESULTS:  CMP     Component Value Date/Time   NA 137 01/03/2021 0000   K 3.6 01/03/2021 0000   CL 104 01/03/2021 0000   CO2 23 (A) 01/03/2021 0000   GLUCOSE 111 (H) 02/19/2018 1221   BUN 18 01/03/2021 0000   CREATININE 0.7 01/03/2021 0000   CREATININE 0.77 02/19/2018 1221  CREATININE 0.90 09/12/2017 1132   CALCIUM 8.9 01/03/2021 0000   PROT 7.5 02/19/2018 1221   ALBUMIN 3.1 (A) 01/03/2021 0000   AST 119 (A) 01/03/2021 0000   AST 17 09/12/2017 1132   ALT 134 (A) 01/03/2021 0000   ALT 26 09/12/2017 1132   ALKPHOS 563 (A) 01/03/2021 0000   BILITOT 0.3 02/19/2018 1221   BILITOT 0.5 09/12/2017 1132   GFRNONAA 88.49 11/01/2020 0000   GFRNONAA >60 09/12/2017 1132   GFRAA >90 11/01/2020 0000   GFRAA >60 09/12/2017 1132    No results found for: "TOTALPROTELP", "ALBUMINELP", "A1GS", "A2GS", "BETS", "BETA2SER", "GAMS", "MSPIKE", "SPEI"  No results found for: "KPAFRELGTCHN", "LAMBDASER", "KAPLAMBRATIO"  Lab Results  Component Value Date   WBC 20.0 01/03/2021   NEUTROABS 4 10/01/2019   HGB 12.1 01/03/2021   HCT 37 01/03/2021   MCV 96.4 02/19/2018   PLT 280 01/03/2021    No results found for: "LABCA2"  No components found for: "ZOXWRU045"  No results for input(s): "INR" in the last 168 hours.  No results found for: "LABCA2"  No results found for: "WUJ811"  No results found for: "CAN125"  No results found for: "CAN153"  No results found  for: "CA2729"  No components found for: "HGQUANT"  No results found for: "CEA1", "CEA" / No results found for: "CEA1", "CEA"   No results found for: "AFPTUMOR"  No results found for: "CHROMOGRNA"  No results found for: "TOTALPROTELP", "ALBUMINELP", "A1GS", "A2GS", "BETS", "BETA2SER", "GAMS", "MSPIKE", "SPEI" (this displays SPEP labs)  No results found for: "KPAFRELGTCHN", "LAMBDASER", "KAPLAMBRATIO" (kappa/lambda light chains)  No results found for: "HGBA", "HGBA2QUANT", "HGBFQUANT", "HGBSQUAN" (Hemoglobinopathy evaluation)   No results found for: "LDH"  Lab Results  Component Value Date   IRON 69 02/19/2019   TIBC 335 02/19/2019   IRONPCTSAT 20.60 02/19/2019   (Iron and TIBC)  Lab Results  Component Value Date   FERRITIN 232.60 02/19/2019    Urinalysis    Component Value Date/Time   COLORURINE YELLOW 01/08/2018 0012   APPEARANCEUR CLEAR 01/08/2018 0012   LABSPEC 1.020 01/08/2018 0012   PHURINE 5.0 01/08/2018 0012   GLUCOSEU NEGATIVE 01/08/2018 0012   HGBUR NEGATIVE 01/08/2018 0012   BILIRUBINUR NEGATIVE 01/08/2018 0012   KETONESUR 20 (A) 01/08/2018 0012   PROTEINUR NEGATIVE 01/08/2018 0012   NITRITE NEGATIVE 01/08/2018 0012   LEUKOCYTESUR SMALL (A) 01/08/2018 0012    STUDIES: No results found.   ELIGIBLE FOR AVAILABLE RESEARCH PROTOCOL: no  ASSESSMENT: 81 y.o. [facility] skilled nursing facility resident status post left breast upper outer quadrant biopsy 09/05/2017 for a clinical T2N0, stage Ib invasive ductal carcinoma, grade 2, estrogen and progesterone receptor positive, HER-2 not amplified, with an MIB-1 of 15%.  (1) status post left mastectomy 10/30/2017, for a pT4 pN1, stage IIIB invasive ductal carcinoma, grade 2, with lymphovascular invasion and involvement of the nipple and epidermis of the areola, but with negative margins  (a) 1 of 2 non-sentinel lymph nodes removed positive extra capsular extension  (2) anastrozole started on 12/12/2017, to be  continued a total of 7 years  (3) Adjuvant radiation 01/08/2018 - 03/01/2018  (a) The patient initially received a dose of 50.4 Gy in 28 fractions to the left chest wall and supraclavicular region. This was delivered using a 3-D conformal, 4 field technique. The patient then received a boost to the mastectomy scar. This delivered an additional 10 Gy in 5 fractions using an en face electron field. The total dose was 60.4 Gy.  (4) status post  remote CVA, followed by [facility] care /palliative care   PLAN:  Called her daughter and spoke to her.  She is tolerating anastrozole well and Dr Jana Hakim suggested it for 7 yrs. According to daughter, she has not had any new breast cancer related issues but has been slowly declining and losing weight for the past 1 year.  She has lost about 30 pounds or so in the past 1 year.  She has not been eating very well, has a good appetite but cannot swallow everything. Dr Jana Hakim and patient's daughter previously had a discussion about how she cannot do mammograms regularly and how her PS may interfere with being aggressive. Hence they have agreed to virtual visits with her daughter being at bedside.   Total encounter time : 20 min  I connected with  Carolyn Parrish on 09/20/21 by a telephone  application and verified that I am speaking with the correct person using two identifiers.   I discussed the limitations of evaluation and management by telemedicine. The patient expressed understanding and agreed to proceed.   *Total Encounter Time as defined by the Centers for Medicare and Medicaid Services includes, in addition to the face-to-face time of a patient visit (documented in the note above) non-face-to-face time: obtaining and reviewing outside history, ordering and reviewing medications, tests or procedures, care coordination (communications with other health care professionals or caregivers) and documentation in the medical record.

## 2021-10-25 ENCOUNTER — Inpatient Hospital Stay (HOSPITAL_BASED_OUTPATIENT_CLINIC_OR_DEPARTMENT_OTHER): Payer: Commercial Managed Care - HMO | Admitting: Hematology and Oncology

## 2021-10-25 DIAGNOSIS — C50212 Malignant neoplasm of upper-inner quadrant of left female breast: Secondary | ICD-10-CM

## 2021-10-25 DIAGNOSIS — Z170 Estrogen receptor positive status [ER+]: Secondary | ICD-10-CM

## 2021-10-25 DIAGNOSIS — C50912 Malignant neoplasm of unspecified site of left female breast: Secondary | ICD-10-CM

## 2021-10-25 NOTE — Progress Notes (Signed)
[facility]  Telephone:(336) [phone] Fax:(336) [phone]     ID: Carolyn Parrish DOB: 26-Sep-1940  MR#: [id number]  BJS#:[id number]  Patient Care Team: Pcp, No as PCP - General Erroll Luna, MD as Consulting Physician (General Surgery) Magrinat, Virgie Dad, MD (Inactive) as Consulting Physician (Oncology) Kyung Rudd, MD as Consulting Physician (Radiation Oncology) Teodoro Spray, NP as Nurse Practitioner (Family Medicine) OTHER MD:   CHIEF COMPLAINT: Estrogen receptor positive breast cancer  CURRENT TREATMENT: anastrozole  INTERVAL HISTORY: Patient is scheduled for telehealth visit.  Unfortunately I could not call on time hence had to speak to the daughter alone.  According to the daughter, nutrition continues to be the #1 issue for her mom.  She continues to decline in weight.  She states that she is very worried about her nutrition but cancer is the least of her concerns at this time.  She has been taking anastrozole as prescribed.  She does not think there has been any change from the breast cancer standpoint.  She has a Designer, jewellery and an inpatient physician.  She will talk to them about considering a breast exam at least once a month.    No other concerning review of system    COVID 19 VACCINATION STATUS: Moderna x2, status post booster x1   HISTORY OF CURRENT ILLNESS: From the original intake note:  Carolyn Parrish was found to have a palpable mass in the left breast. She underwent bilateral diagnostic mammography with tomography and left breast ultrasonography at [facility] on 08/31/2017 showing: breast density category C. There was a suspicious mass in the left breast at the 9 o'clock retroareolar location involving the medial aspect of the nipple, measuring 2.8 x 1.6 x 1.0 cm. Sonographic evaluation of the left axilla demonstrates no suspicious lymphadenopathy. Limited mammographic evaluation of the right breast is  unremarkable.  Accordingly on 09/05/2017 she proceeded to biopsy of the left breast area in question. The pathology from this procedure showed ([id number]): Invasive ductal carcinoma grade 2. Perineural invasion is identified. Prognostic indicators significant for: estrogen receptor, 100% positive and progesterone receptor, 80% positive, both with strong staining intensity. Proliferation marker Ki67 at 15%. HER2 not amplified with ratios HER2/CEP17 signals 1.70 and average HER2 copies per cell 4.75.   The patient's subsequent history is as detailed below.   PAST MEDICAL HISTORY: Past Medical History:  Diagnosis Date   Cancer ([place])    BREAST CANCER   CHF (congestive heart failure) (HCC)    DM (diabetes mellitus), secondary, with peripheral vascular complications (HCC)    Hyperlipidemia    Hypertension    Schizoaffective disorder ([place])    Stroke ([place])   Polyosteoarthrtis and generalized muscle weakness.  Diabetes- takes metformin   PAST SURGICAL HISTORY: Past Surgical History:  Procedure Laterality Date   CHOLECYSTECTOMY     2016   MASTECTOMY COMPLETE / SIMPLE Left 10/30/2017   SIMPLE MASTECTOMY WITH AXILLARY SENTINEL NODE BIOPSY Left 10/30/2017   Procedure: LEFT SIMPLE MASTECTOMY;  Surgeon: Erroll Luna, MD;  Location: [facility];  Service: General;  Laterality: Left;   Cholecystectomy 2016   FAMILY HISTORY No family history on file. The patient's father died of old age at 55. The patient's mother also died of old age at 29. The patient has 5 brothers and 6 sisters. The patient denies a family history of breast or ovarian cancer.   GYNECOLOGIC HISTORY:  No LMP recorded. Patient is postmenopausal. Age at first live birth: 81 years old She is  [place] P4 (the last was still born).  Her LMP was age [age]. She never took HRT or oral contraception.    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 The patient is widowed. The  patient's daughter, Carolyn Parrish lives in [place], [place] and works as a Freight forwarder. The patient's son, Carolyn Parrish lives in [place], [place] and works as a Administrator. The patient's daughter, Carolyn Parrish lives in [place] and works as a Customer service manager. The patient as 3 grandchildren. She is a Psychologist, forensic.     ADVANCED DIRECTIVES: The patient's daughter, Carolyn Parrish is her HCPOA, and she can be reached at [phone]   HEALTH MAINTENANCE: Social History   Tobacco Use   Smoking status: Never   Smokeless tobacco: Never  Vaping Use   Vaping Use: Never used  Substance Use Topics   Alcohol use: Not Currently   Drug use: Never        Allergies  Allergen Reactions   Atorvastatin     UNSPECIFIED REACTION     Current Outpatient Medications  Medication Sig Dispense Refill   acetaminophen (TYLENOL) 500 MG tablet Take 500 mg by mouth every 4 (four) hours as needed.     alum & mag hydroxide-simeth (MAALOX/MYLANTA) 200-200-20 MG/5ML suspension Take 30 mLs by mouth every 4 (four) hours as needed for indigestion or heartburn.     Amino Acids-Protein Hydrolys (FEEDING SUPPLEMENT, PRO-STAT SUGAR FREE 64,) LIQD Take 30 mLs by mouth 3 (three) times daily with meals.     anastrozole (ARIMIDEX) 1 MG tablet Take 1 tablet (1 mg total) by mouth daily. 90 tablet 4   aspirin EC 81 MG tablet Take 81 mg by mouth daily.     bisacodyl (DULCOLAX) 10 MG suppository Place 10 mg rectally as needed for moderate constipation.     camphor-menthol (SARNA) lotion Apply 1 application topically 2 (two) times daily as needed for itching.     Cholecalciferol (VITAMIN D3) 25 MCG (1000 UT) CAPS Take 1 capsule by mouth daily.     DIVALPROEX SODIUM ER PO Take 125 mg by mouth daily. Give 4 capsules by mouth daily (500 mg)     docusate sodium (COLACE) 100 MG capsule Take 100 mg by mouth 2 (two) times daily. For constipation     magnesium hydroxide (MILK OF MAGNESIA) 400 MG/5ML suspension Take 30 mLs by mouth daily as needed for mild constipation.      Nutritional Supplements (ENSURE ENLIVE PO) Take 237 mLs by mouth 4 (four) times daily.     OLANZapine (ZYPREXA) 5 MG tablet Take 1 tablet (5 mg total) by mouth at bedtime. 30 tablet 0   oxyCODONE (OXY IR/ROXICODONE) 5 MG immediate release tablet Take 5 mg by mouth every 4 (four) hours as needed for pain.     polyethylene glycol (MIRALAX / GLYCOLAX) packet Take 17 g by mouth daily.     sennosides-docusate sodium (SENOKOT-S) 8.6-50 MG tablet Take 1 tablet by mouth at bedtime.     Sodium Phosphates (RA SALINE ENEMA) 19-7 GM/118ML ENEM Place 1 each rectally as needed (for constipation).     vitamin B-12 (CYANOCOBALAMIN) 500 MCG tablet Take 500 mcg by mouth daily.     No current facility-administered medications for this visit.    OBJECTIVE:  There were no vitals filed for this visit.     There is no height or weight on file to calculate BMI.   Wt Readings from Last 3 Encounters:  09/07/20  178 lb 6.4 oz (80.9 kg)  02/03/20 196 lb (88.9 kg)  10/14/19 196 lb 6.4 oz (89.1 kg)  ECOG FS:2 - Symptomatic, <50% confined to bed   Physical exam not done, patient bedbound LAB RESULTS:  CMP     Component Value Date/Time   NA 137 01/03/2021 0000   K 3.6 01/03/2021 0000   CL 104 01/03/2021 0000   CO2 23 (A) 01/03/2021 0000   GLUCOSE 111 (H) 02/19/2018 1221   BUN 18 01/03/2021 0000   CREATININE 0.7 01/03/2021 0000   CREATININE 0.77 02/19/2018 1221   CREATININE 0.90 09/12/2017 1132   CALCIUM 8.9 01/03/2021 0000   PROT 7.5 02/19/2018 1221   ALBUMIN 3.1 (A) 01/03/2021 0000   AST 119 (A) 01/03/2021 0000   AST 17 09/12/2017 1132   ALT 134 (A) 01/03/2021 0000   ALT 26 09/12/2017 1132   ALKPHOS 563 (A) 01/03/2021 0000   BILITOT 0.3 02/19/2018 1221   BILITOT 0.5 09/12/2017 1132   GFRNONAA 88.49 11/01/2020 0000   GFRNONAA >60 09/12/2017 1132   GFRAA >90 11/01/2020 0000   GFRAA >60 09/12/2017 1132    No results found for: "TOTALPROTELP", "ALBUMINELP", "A1GS", "A2GS", "BETS", "BETA2SER",  "GAMS", "MSPIKE", "SPEI"  No results found for: "KPAFRELGTCHN", "LAMBDASER", "KAPLAMBRATIO"  Lab Results  Component Value Date   WBC 20.0 01/03/2021   NEUTROABS 4 10/01/2019   HGB 12.1 01/03/2021   HCT 37 01/03/2021   MCV 96.4 02/19/2018   PLT 280 01/03/2021    No results found for: "LABCA2"  No components found for: "PJASNK539"  No results for input(s): "INR" in the last 168 hours.  No results found for: "LABCA2"  No results found for: "JQB341"  No results found for: "CAN125"  No results found for: "CAN153"  No results found for: "CA2729"  No components found for: "HGQUANT"  No results found for: "CEA1", "CEA" / No results found for: "CEA1", "CEA"   No results found for: "AFPTUMOR"  No results found for: "CHROMOGRNA"  No results found for: "TOTALPROTELP", "ALBUMINELP", "A1GS", "A2GS", "BETS", "BETA2SER", "GAMS", "MSPIKE", "SPEI" (this displays SPEP labs)  No results found for: "KPAFRELGTCHN", "LAMBDASER", "KAPLAMBRATIO" (kappa/lambda light chains)  No results found for: "HGBA", "HGBA2QUANT", "HGBFQUANT", "HGBSQUAN" (Hemoglobinopathy evaluation)   No results found for: "LDH"  Lab Results  Component Value Date   IRON 69 02/19/2019   TIBC 335 02/19/2019   IRONPCTSAT 20.60 02/19/2019   (Iron and TIBC)  Lab Results  Component Value Date   FERRITIN 232.60 02/19/2019    Urinalysis    Component Value Date/Time   COLORURINE YELLOW 01/08/2018 0012   APPEARANCEUR CLEAR 01/08/2018 0012   LABSPEC 1.020 01/08/2018 0012   PHURINE 5.0 01/08/2018 0012   GLUCOSEU NEGATIVE 01/08/2018 0012   HGBUR NEGATIVE 01/08/2018 0012   BILIRUBINUR NEGATIVE 01/08/2018 0012   KETONESUR 20 (A) 01/08/2018 0012   PROTEINUR NEGATIVE 01/08/2018 0012   NITRITE NEGATIVE 01/08/2018 0012   LEUKOCYTESUR SMALL (A) 01/08/2018 0012    STUDIES: No results found.   ELIGIBLE FOR AVAILABLE RESEARCH PROTOCOL: no  ASSESSMENT: 81 y.o. [facility] skilled nursing facility resident status  post left breast upper outer quadrant biopsy 09/05/2017 for a clinical T2N0, stage Ib invasive ductal carcinoma, grade 2, estrogen and progesterone receptor positive, HER-2 not amplified, with an MIB-1 of 15%.  (1) status post left mastectomy 10/30/2017, for a pT4 pN1, stage IIIB invasive ductal carcinoma, grade 2, with lymphovascular invasion and involvement of the nipple and epidermis of the areola, but with negative margins  (  a) 1 of 2 non-sentinel lymph nodes removed positive extra capsular extension  (2) anastrozole started on 12/12/2017, to be continued a total of 7 years  (3) Adjuvant radiation 01/08/2018 - 03/01/2018  (a) The patient initially received a dose of 50.4 Gy in 28 fractions to the left chest wall and supraclavicular region. This was delivered using a 3-D conformal, 4 field technique. The patient then received a boost to the mastectomy scar. This delivered an additional 10 Gy in 5 fractions using an en face electron field. The total dose was 60.4 Gy.  (4) status post remote CVA, followed by [facility] care /palliative care   PLAN:  Called her daughter and spoke to her. We couldn't connect for the video visit as planned today She continues to lose weight, this has gotten worse. She continues to stay bed bound, hence cannot come for an in person visit. She has continued on anastrozole however. Daughter also says " cancer is the least of her concerns" Given the limited connection with the patient, telephone visit, based on the information we were provided, there appears to be no new health concerns from breast cancer standpoint hence we have discussed about continuing current anastrozole and returning to clinic in about 6 months or sooner as needed I also believe that her morbidities take priority at this time and her life expectancy is likely limited from other comorbidities and the worsening nutrition.  Total time spent: 15 min  *Total Encounter Time as defined by the Centers for  Medicare and Medicaid Services includes, in addition to the face-to-face time of a patient visit (documented in the note above) non-face-to-face time: obtaining and reviewing outside history, ordering and reviewing medications, tests or procedures, care coordination (communications with other health care professionals or caregivers) and documentation in the medical record. Ms. Slagel I am since

## 2021-12-03 DIAGNOSIS — Z853 Personal history of malignant neoplasm of breast: Secondary | ICD-10-CM | POA: Diagnosis not present

## 2021-12-03 DIAGNOSIS — F0390 Unspecified dementia without behavioral disturbance: Secondary | ICD-10-CM | POA: Insufficient documentation

## 2021-12-03 DIAGNOSIS — Z7982 Long term (current) use of aspirin: Secondary | ICD-10-CM | POA: Diagnosis not present

## 2021-12-03 DIAGNOSIS — W06XXXA Fall from bed, initial encounter: Secondary | ICD-10-CM | POA: Diagnosis not present

## 2021-12-03 DIAGNOSIS — S0083XA Contusion of other part of head, initial encounter: Secondary | ICD-10-CM | POA: Insufficient documentation

## 2021-12-03 DIAGNOSIS — I2510 Atherosclerotic heart disease of native coronary artery without angina pectoris: Secondary | ICD-10-CM | POA: Diagnosis not present

## 2021-12-03 DIAGNOSIS — E119 Type 2 diabetes mellitus without complications: Secondary | ICD-10-CM | POA: Diagnosis not present

## 2021-12-03 DIAGNOSIS — S0990XA Unspecified injury of head, initial encounter: Secondary | ICD-10-CM | POA: Diagnosis present

## 2021-12-04 ENCOUNTER — Emergency Department (HOSPITAL_COMMUNITY): Payer: Medicare Other

## 2021-12-04 ENCOUNTER — Encounter (HOSPITAL_COMMUNITY): Payer: Self-pay

## 2021-12-04 ENCOUNTER — Emergency Department (HOSPITAL_COMMUNITY)
Admission: EM | Admit: 2021-12-04 | Discharge: 2021-12-04 | Disposition: A | Discharge status: Home / Self Care | Payer: Medicare Other | Attending: Emergency Medicine | Admitting: Emergency Medicine

## 2021-12-04 ENCOUNTER — Other Ambulatory Visit: Payer: Self-pay

## 2021-12-04 DIAGNOSIS — S0990XA Unspecified injury of head, initial encounter: Secondary | ICD-10-CM

## 2021-12-04 DIAGNOSIS — S0083XA Contusion of other part of head, initial encounter: Secondary | ICD-10-CM | POA: Diagnosis not present

## 2021-12-04 DIAGNOSIS — W19XXXA Unspecified fall, initial encounter: Secondary | ICD-10-CM

## 2021-12-04 NOTE — ED Provider Notes (Signed)
[facility]-EMERGENCY DEPT Provider Note   CSN: [id number] Arrival date & time: 12/03/21  2358     History  Chief Complaint  Patient presents with   Fall    Carolyn Parrish is a 81 y.o. female.  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 Patient is not ambulatory at baseline. She was found next to her bed. Hematoma to the back of the head. Patient noted some LUE pain with movement, but will not answer many other questions. Can not explain what contributed to her fall.  She is not on chronic anticoagulation.  Level 5 caveat 2/2 dementia.  CODE STATUS is DNR.  The history is provided by the EMS personnel. No language interpreter was used.  Fall       Home Medications Prior to Admission medications   Medication Sig Start Date End Date Taking? Authorizing Provider  acetaminophen (TYLENOL) 500 MG tablet Take 500 mg by mouth every 4 (four) hours as needed.    [provider]  alum & mag hydroxide-simeth (MAALOX/MYLANTA) 200-200-20 MG/5ML suspension Take 30 mLs by mouth every 4 (four) hours as needed for indigestion or heartburn.    [provider]  Amino Acids-Protein Hydrolys (FEEDING SUPPLEMENT, PRO-STAT SUGAR FREE 64,) LIQD Take 30 mLs by mouth 3 (three) times daily with meals.    [provider]  anastrozole (ARIMIDEX) 1 MG tablet Take 1 tablet (1 mg total) by mouth daily. 11/23/17   Magrinat, Valentino Hue, MD  aspirin EC 81 MG tablet Take 81 mg by mouth daily.    [provider]  bisacodyl (DULCOLAX) 10 MG suppository Place 10 mg rectally as needed for moderate constipation.    [provider]  camphor-menthol Wynelle Fanny) lotion Apply 1 application topically 2 (two) times daily as needed for itching.    [provider]  Cholecalciferol (VITAMIN  D3) 25 MCG (1000 UT) CAPS Take 1 capsule by mouth daily.    [provider]  DIVALPROEX SODIUM ER PO Take 125 mg by mouth daily. Give 4 capsules by mouth daily (500 mg) 03/05/18   [provider]  docusate sodium (COLACE) 100 MG capsule Take 100 mg by mouth 2 (two) times daily. For constipation    [provider]  magnesium hydroxide (MILK OF MAGNESIA) 400 MG/5ML suspension Take 30 mLs by mouth daily as needed for mild constipation.    [provider]  Nutritional Supplements (ENSURE ENLIVE PO) Take 237 mLs by mouth 4 (four) times daily.    [provider]  OLANZapine (ZYPREXA) 5 MG tablet Take 1 tablet (5 mg total) by mouth at bedtime. 01/12/18   Rolly Salter, MD  oxyCODONE (OXY IR/ROXICODONE) 5 MG immediate release tablet Take 5 mg by mouth every 4 (four) hours as needed for pain.    [provider]  polyethylene glycol (MIRALAX / GLYCOLAX) packet Take 17 g by mouth daily.    [provider]  sennosides-docusate sodium (SENOKOT-S) 8.6-50 MG tablet Take 1 tablet by mouth at bedtime.    [provider]  Sodium Phosphates (RA SALINE ENEMA) 19-7 GM/118ML ENEM Place 1 each rectally as needed (for constipation).    [provider]  vitamin B-12 (CYANOCOBALAMIN) 500 MCG tablet Take 500 mcg by mouth daily.    [provider]      Allergies  Atorvastatin    Review of Systems   Review of Systems  Unable to perform ROS: Dementia    Physical Exam Updated Vital Signs BP 131/65 (BP Location: Right Arm)   Pulse 65   Temp 98.4 F (36.9 C) (Oral)   Resp 18   Ht 5\' 6"  (1.676 m)   Wt 62.2 kg   SpO2 99%   BMI 22.13 kg/m   Physical Exam Vitals and nursing note reviewed.  Constitutional:      General: She is not in acute distress.    Appearance: She is well-developed. She is not diaphoretic.     Comments: Nontoxic appearing and in NAD  HENT:     Head: Normocephalic.     Comments: Hematoma to posterior  parietal scalp without skull instability. Eyes:     General: No scleral icterus.    Extraocular Movements: Extraocular movements intact.     Conjunctiva/sclera: Conjunctivae normal.  Neck:     Comments: C-collar in place Cardiovascular:     Rate and Rhythm: Normal rate and regular rhythm.     Pulses: Normal pulses.  Pulmonary:     Effort: Pulmonary effort is normal. No respiratory distress.     Comments: Respirations even and unlabored Abdominal:     General: There is no distension.     Palpations: Abdomen is soft.  Musculoskeletal:        General: Normal range of motion.     Cervical back: Normal range of motion.     Comments: Preserved ROM of BUE. No crepitus or deformity of the L shoulder, L elbow, L wrist.   Skin:    General: Skin is warm and dry.     Coloration: Skin is not pale.     Findings: No erythema or rash.  Neurological:     Mental Status: She is alert.     Comments: Patient alert, moving extremities spontaneously.  Psychiatric:        Behavior: Behavior normal.     ED Results / Procedures / Treatments   Labs (all labs ordered are listed, but only abnormal results are displayed) Labs Reviewed - No data to display  EKG None  Radiology CT Head Wo Contrast  Result Date: 12/04/2021 CLINICAL DATA:  Fall from bed, head and neck trauma. Hematoma to back of head. EXAM: CT HEAD WITHOUT CONTRAST CT CERVICAL SPINE WITHOUT CONTRAST TECHNIQUE: Multidetector CT imaging of the head and cervical spine was performed following the standard protocol without intravenous contrast. Multiplanar CT image reconstructions of the cervical spine were also generated. RADIATION DOSE REDUCTION: This exam was performed according to the departmental dose-optimization program which includes automated exposure control, adjustment of the mA and/or kV according to patient size and/or use of iterative reconstruction technique. COMPARISON:  01/10/2018. FINDINGS: CT HEAD FINDINGS Brain: No acute  intracranial hemorrhage, midline shift or mass effect. No extra-axial fluid collection. Diffuse atrophy is noted, and slightly more pronounced at the cerebellum. Periventricular white matter hypodensities are present bilaterally. No hydrocephalus. Vascular: Atherosclerotic calcification of the carotid siphons and vertebral arteries. No hyperdense vessel. Skull: No acute fracture. Sinuses/Orbits: No acute finding. Other: Small scalp hematoma over the parietal bone on the left posteriorly. CT CERVICAL SPINE FINDINGS Alignment: Normal. Skull base and vertebrae: No acute fracture. No primary bone lesion or focal pathologic process. Soft tissues and spinal canal: No prevertebral fluid or swelling. No visible canal hematoma. Disc levels: There is partial fusion of the C5 through C7 vertebral bodies. Intervertebral disc space narrowing and endplate  osteophyte formation is noted at C3-C4 and C4-C5. Facet arthropathy is noted bilaterally. There is calcification of the posterior longitudinal ligament. Upper chest: Interstitial prominence and thickening are noted at the left lung apex. Other: No acute abnormality. IMPRESSION: 1. No acute intracranial process. 2. Atrophy with chronic microvascular ischemic changes. 3. Degenerative changes in the cervical spine without evidence of acute fracture. Electronically Signed   By: Thornell Sartorius M.D.   On: 12/04/2021 01:50   CT Cervical Spine Wo Contrast  Result Date: 12/04/2021 CLINICAL DATA:  Fall from bed, head and neck trauma. Hematoma to back of head. EXAM: CT HEAD WITHOUT CONTRAST CT CERVICAL SPINE WITHOUT CONTRAST TECHNIQUE: Multidetector CT imaging of the head and cervical spine was performed following the standard protocol without intravenous contrast. Multiplanar CT image reconstructions of the cervical spine were also generated. RADIATION DOSE REDUCTION: This exam was performed according to the departmental dose-optimization program which includes automated exposure  control, adjustment of the mA and/or kV according to patient size and/or use of iterative reconstruction technique. COMPARISON:  01/10/2018. FINDINGS: CT HEAD FINDINGS Brain: No acute intracranial hemorrhage, midline shift or mass effect. No extra-axial fluid collection. Diffuse atrophy is noted, and slightly more pronounced at the cerebellum. Periventricular white matter hypodensities are present bilaterally. No hydrocephalus. Vascular: Atherosclerotic calcification of the carotid siphons and vertebral arteries. No hyperdense vessel. Skull: No acute fracture. Sinuses/Orbits: No acute finding. Other: Small scalp hematoma over the parietal bone on the left posteriorly. CT CERVICAL SPINE FINDINGS Alignment: Normal. Skull base and vertebrae: No acute fracture. No primary bone lesion or focal pathologic process. Soft tissues and spinal canal: No prevertebral fluid or swelling. No visible canal hematoma. Disc levels: There is partial fusion of the C5 through C7 vertebral bodies. Intervertebral disc space narrowing and endplate osteophyte formation is noted at C3-C4 and C4-C5. Facet arthropathy is noted bilaterally. There is calcification of the posterior longitudinal ligament. Upper chest: Interstitial prominence and thickening are noted at the left lung apex. Other: No acute abnormality. IMPRESSION: 1. No acute intracranial process. 2. Atrophy with chronic microvascular ischemic changes. 3. Degenerative changes in the cervical spine without evidence of acute fracture. Electronically Signed   By: Thornell Sartorius M.D.   On: 12/04/2021 01:50    Procedures Procedures    Medications Ordered in ED Medications - No data to display  ED Course/ Medical Decision Making/ A&P                           Medical Decision Making Amount and/or Complexity of Data Reviewed Radiology: ordered.   This patient presents to the ED for concern of unwitnessed fall, this involves an extensive number of treatment options, and is a  complaint that carries with it a high risk of complications and morbidity.  The differential diagnosis includes ICH vs skull fx vs hematoma vs concussion   Co morbidities that complicate the patient evaluation  Dementia    Additional history obtained:  Additional history obtained from EMS External records from outside source obtained and reviewed including head CT results from 2019   Imaging Studies ordered:  I ordered imaging studies including CT head, CT C-spine  I independently visualized and interpreted imaging which showed no acute pathology I agree with the radiologist interpretation   Cardiac Monitoring:  The patient was maintained on a cardiac monitor.  I personally viewed and interpreted the cardiac monitored which showed an underlying rhythm of: sinus bradycardia   Medicines ordered and  prescription drug management:  I have reviewed the patients home medicines and have made adjustments as needed   Test Considered:  UA   Reevaluation:  After the interventions noted above, I reevaluated the patient and found that they have :stayed the same   Social Determinants of Health:  Insured patient SNF resident   Dispostion:  After consideration of the diagnostic results and the patients response to treatment, I feel that the patent would benefit from outpatient PCP recheck PRN. Transported back to SNF via [facility] in stable condition.          Final Clinical Impression(s) / ED Diagnoses Final diagnoses:  Fall, initial encounter  Injury of head, initial encounter    Rx / DC Orders ED Discharge Orders     None         Antony Madura, PA-C 12/04/21 2352    Geoffery Lyons, MD 12/05/21 [phone]

## 2021-12-04 NOTE — ED Triage Notes (Signed)
Pt arrived by [employer], from [facility]. Pt fell from her bed this am, does not walk at baseline.  Has a hematoma to the back of head and the left arm hurts her when she moves it.  Has a c-collar on due to not being able to answer questions which is baseline for her.

## 2022-01-03 ENCOUNTER — Non-Acute Institutional Stay: Payer: Medicare Other | Admitting: Hospice

## 2022-01-03 DIAGNOSIS — Z515 Encounter for palliative care: Secondary | ICD-10-CM

## 2022-01-03 DIAGNOSIS — R1310 Dysphagia, unspecified: Secondary | ICD-10-CM

## 2022-01-03 DIAGNOSIS — F39 Unspecified mood [affective] disorder: Secondary | ICD-10-CM

## 2022-01-03 DIAGNOSIS — F0390 Unspecified dementia without behavioral disturbance: Secondary | ICD-10-CM

## 2022-01-03 NOTE — Progress Notes (Signed)
[facility] Consult Note Telephone: [phone]  Fax: [phone]  PATIENT NAME: Carolyn Parrish DOB: 08-15-1940 MRN: [id number]  PRIMARY CARE PROVIDER:   Pcp, No No referring provider defined for this encounter.  REFERRING PROVIDER: Pcp, No No referring provider defined for this encounter.  Marlane Mingle [employer] NP  RESPONSIBLE PARTY: Extended Emergency Contact Information Primary Emergency Contact: [place], [place] Mobile Phone: [phone] Relation: Daughter   Contact Information     Name Relation Home Work Mobile   Graviela, Nodal Daughter   [phone]       Visit is to build trust and highlight Palliative Medicine as specialized medical care for people living with serious illness, aimed at facilitating better quality of life through symptoms relief, assisting with advance care planning and complex medical decision making.  NP called [name], mailbox was full and could not accept any voicemail. RECOMMENDATIONS/PLAN:   Advance Care Planning/Code Status: Patient is a DO NOT RESUSCITATE.  Goals of Care: Goals of care include to maximize quality of life and symptom management.  MOST selections include limited additional intervention, IV fluids for defined trial period, antibiotics if indicated, feeding tube for defined trial period.  Discussion with [name] on prospects of hospice service.  She is not interested in hospice service at this time.  Palliative care team will continue to support patient, patient's family, and medical team.  Symptom management/Plan:  Dementia: Advanced, worsening memory loss and confusion in line with dementia disease trajectory.  FAST 7c, can no longer walk, impoverished thoughts, limited language, bedbound, incontinent of bowel and bladder and limited communication ability.  Continue ongoing supportive care.  Fall precautions.  Mood disorder: Managed with Depakote sprinkles, Olanzapine.   Psych consult as planned.  Routine CBC CMP, Depakote level. Dysphagia: Continue pured diet as ordered.  Aspiration precautions in place. ST consult as needed   Follow up: Palliative care will continue to follow for complex medical decision making, advance care planning, and clarification of goals. Return 6 weeks or prn. Encouraged to call provider sooner with any concerns.  CHIEF COMPLAINT: Palliative follow up visit  HISTORY OF PRESENT ILLNESS:  Carolyn Parrish a 81 y.o. 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 History of breast cancer, CAD, DM2, mood disorder, schizophrenia.  History obtained from review of EMR, discussion with primary team, family and/or patient. Records reviewed and summarized above. All 10 point systems reviewed and are negative except as documented in history of present illness above  Review and summarization of Epic records shows history from other than patient.   Palliative Care was asked to follow this patient o help address complex decision making in the context of advance care planning and goals of care clarification.    PERTINENT MEDICATIONS:  Outpatient Encounter Medications as of 01/03/2022  Medication Sig   acetaminophen (TYLENOL) 500 MG tablet Take 500 mg by mouth every 4 (four) hours as needed.   alum & mag hydroxide-simeth (MAALOX/MYLANTA) 200-200-20 MG/5ML suspension Take 30 mLs by mouth every 4 (four) hours as needed for indigestion or heartburn.   Amino Acids-Protein Hydrolys (FEEDING SUPPLEMENT, PRO-STAT SUGAR FREE 64,) LIQD Take 30 mLs by mouth 3 (three) times daily with meals.   anastrozole (ARIMIDEX) 1 MG tablet Take 1 tablet (1 mg total) by mouth daily.   aspirin EC 81 MG tablet Take 81 mg by mouth daily.   bisacodyl (DULCOLAX) 10 MG suppository Place 10 mg rectally as needed for moderate constipation.  camphor-menthol (SARNA) lotion Apply 1 application topically 2 (two)  times daily as needed for itching.   Cholecalciferol (VITAMIN D3) 25 MCG (1000 UT) CAPS Take 1 capsule by mouth daily.   DIVALPROEX SODIUM ER PO Take 125 mg by mouth daily. Give 4 capsules by mouth daily (500 mg)   docusate sodium (COLACE) 100 MG capsule Take 100 mg by mouth 2 (two) times daily. For constipation   magnesium hydroxide (MILK OF MAGNESIA) 400 MG/5ML suspension Take 30 mLs by mouth daily as needed for mild constipation.   Nutritional Supplements (ENSURE ENLIVE PO) Take 237 mLs by mouth 4 (four) times daily.   OLANZapine (ZYPREXA) 5 MG tablet Take 1 tablet (5 mg total) by mouth at bedtime.   oxyCODONE (OXY IR/ROXICODONE) 5 MG immediate release tablet Take 5 mg by mouth every 4 (four) hours as needed for pain.   polyethylene glycol (MIRALAX / GLYCOLAX) packet Take 17 g by mouth daily.   sennosides-docusate sodium (SENOKOT-S) 8.6-50 MG tablet Take 1 tablet by mouth at bedtime.   Sodium Phosphates (RA SALINE ENEMA) 19-7 GM/118ML ENEM Place 1 each rectally as needed (for constipation).   vitamin B-12 (CYANOCOBALAMIN) 500 MCG tablet Take 500 mcg by mouth daily.   No facility-administered encounter medications on file as of 01/03/2022.    HOSPICE ELIGIBILITY/DIAGNOSIS: TBD  PAST MEDICAL HISTORY:  Past Medical History:  Diagnosis Date   Cancer ([place])    BREAST CANCER   CHF (congestive heart failure) (HCC)    DM (diabetes mellitus), secondary, with peripheral vascular complications (HCC)    Hyperlipidemia    Hypertension    Schizoaffective disorder ([place])    Stroke ([place])       ALLERGIES:  Allergies  Allergen Reactions   Atorvastatin     UNSPECIFIED REACTION      I spent 40 minutes providing this consultation; time includes spent with patient/family, chart review and documentation. More than 50% of the time in this consultation was spent on care coordination Thank you for the opportunity to participate in the care of Carolyn Parrish Please call our office at [phone] if  we can be of additional assistance.  Note: Portions of this note were generated with Lobbyist. Dictation errors may occur despite best attempts at proofreading.  Teodoro Spray, NP

## 2022-01-19 ENCOUNTER — Non-Acute Institutional Stay: Payer: Medicare Other | Admitting: Hospice

## 2022-01-19 DIAGNOSIS — F39 Unspecified mood [affective] disorder: Secondary | ICD-10-CM

## 2022-01-19 DIAGNOSIS — F0390 Unspecified dementia without behavioral disturbance: Secondary | ICD-10-CM

## 2022-01-19 DIAGNOSIS — Z515 Encounter for palliative care: Secondary | ICD-10-CM

## 2022-01-19 DIAGNOSIS — R1310 Dysphagia, unspecified: Secondary | ICD-10-CM

## 2022-01-19 NOTE — Progress Notes (Signed)
[facility] Consult Note Telephone: [phone]  Fax: [phone]  PATIENT NAME: Carolyn Parrish DOB: 30-Mar-1940 MRN: [id number]  PRIMARY CARE PROVIDER:   Pcp, No No referring provider defined for this encounter.  REFERRING PROVIDER: Pcp, No No referring provider defined for this encounter.  Marlane Mingle [employer] NP  RESPONSIBLE PARTY: Extended Emergency Contact Information Primary Emergency Contact: Carolyn Parrish, Carolyn Parrish Mobile Phone: [phone] Relation: Daughter   Contact Information     Name Relation Home Work Mobile   Carolyn, Parrish Daughter   [phone]       Visit is to build trust and highlight Palliative Medicine as specialized medical care for people living with serious illness, aimed at facilitating better quality of life through symptoms relief, assisting with advance care planning and complex medical decision making.   NP called Carolyn Parrish and updated her on visit.  She expressed appreciation for the update, and would like to meet up with NP at next visit. RECOMMENDATIONS/PLAN:   Advance Care Planning/Code Status: Patient is a DO NOT RESUSCITATE.  Goals of Care: Goals of care include to maximize quality of life and symptom management.  MOST selections include limited additional intervention, IV fluids for defined trial period, antibiotics if indicated, feeding tube for defined trial period.  Carolyn Parrish is not interested in hospice service at this time.  She wants patient to have jelly sandwich; pt on pured diet . She said she has discussed with management who approved it but not closely implemented.   Palliative care team will continue to support patient, patient's family, and medical team.  Symptom management/Plan:  Dementia: Advanced, needing more cueing to swallow food, worsening memory loss and confusion. FAST 7c, can no longer walk, impoverished thoughts, limited language, bedbound, incontinent of bowel and  bladder and worsening limited communication ability in line with dementia disease trajectory.  Continue ongoing supportive care.  Fall precautions.  Mood disorder: Continue with Depakote sprinkles, Olanzapine.  Psych consult as planned.  Routine CBC CMP, Depakote level. Dysphagia: Patient on pured diet.  Aspiration precautions in place. ST consult as needed  Follow up: Palliative care will continue to follow for complex medical decision making, advance care planning, and clarification of goals. Return 6 weeks or prn. Encouraged to call provider sooner with any concerns.  CHIEF COMPLAINT: Palliative follow up visit  HISTORY OF PRESENT ILLNESS:  Carolyn Parrish a 81 y.o. 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 History of breast cancer, CAD, DM2, mood disorder, schizophrenia.  History obtained from review of EMR, discussion with primary team, family and/or patient. Records reviewed and summarized above. All 10 point systems reviewed and are negative except as documented in history of present illness above  Review and summarization of Epic records shows history from other than patient.   Palliative Care was asked to follow this patient o help address complex decision making in the context of advance care planning and goals of care clarification.    PERTINENT MEDICATIONS:  Outpatient Encounter Medications as of 01/19/2022  Medication Sig   acetaminophen (TYLENOL) 500 MG tablet Take 500 mg by mouth every 4 (four) hours as needed.   alum & mag hydroxide-simeth (MAALOX/MYLANTA) 200-200-20 MG/5ML suspension Take 30 mLs by mouth every 4 (four) hours as needed for indigestion or heartburn.   Amino Acids-Protein Hydrolys (FEEDING SUPPLEMENT, PRO-STAT SUGAR FREE 64,) LIQD Take 30 mLs by mouth 3 (three) times daily with meals.   anastrozole (ARIMIDEX) 1 MG tablet  Take 1 tablet (1 mg total) by mouth daily.   aspirin EC 81 MG tablet  Take 81 mg by mouth daily.   bisacodyl (DULCOLAX) 10 MG suppository Place 10 mg rectally as needed for moderate constipation.   camphor-menthol (SARNA) lotion Apply 1 application topically 2 (two) times daily as needed for itching.   Cholecalciferol (VITAMIN D3) 25 MCG (1000 UT) CAPS Take 1 capsule by mouth daily.   DIVALPROEX SODIUM ER PO Take 125 mg by mouth daily. Give 4 capsules by mouth daily (500 mg)   docusate sodium (COLACE) 100 MG capsule Take 100 mg by mouth 2 (two) times daily. For constipation   magnesium hydroxide (MILK OF MAGNESIA) 400 MG/5ML suspension Take 30 mLs by mouth daily as needed for mild constipation.   Nutritional Supplements (ENSURE ENLIVE PO) Take 237 mLs by mouth 4 (four) times daily.   OLANZapine (ZYPREXA) 5 MG tablet Take 1 tablet (5 mg total) by mouth at bedtime.   oxyCODONE (OXY IR/ROXICODONE) 5 MG immediate release tablet Take 5 mg by mouth every 4 (four) hours as needed for pain.   polyethylene glycol (MIRALAX / GLYCOLAX) packet Take 17 g by mouth daily.   sennosides-docusate sodium (SENOKOT-S) 8.6-50 MG tablet Take 1 tablet by mouth at bedtime.   Sodium Phosphates (RA SALINE ENEMA) 19-7 GM/118ML ENEM Place 1 each rectally as needed (for constipation).   vitamin B-12 (CYANOCOBALAMIN) 500 MCG tablet Take 500 mcg by mouth daily.   No facility-administered encounter medications on file as of 01/19/2022.    HOSPICE ELIGIBILITY/DIAGNOSIS: TBD  PAST MEDICAL HISTORY:  Past Medical History:  Diagnosis Date   Cancer ([place])    BREAST CANCER   CHF (congestive heart failure) (HCC)    DM (diabetes mellitus), secondary, with peripheral vascular complications (HCC)    Hyperlipidemia    Hypertension    Schizoaffective disorder ([place])    Stroke ([place])       ALLERGIES:  Allergies  Allergen Reactions   Atorvastatin     UNSPECIFIED REACTION      I spent 45 minutes providing this consultation; time includes spent with patient/family, chart review and documentation.  More than 50% of the time in this consultation was spent on care coordination Thank you for the opportunity to participate in the care of Carolyn Parrish Please call our office at [phone] if we can be of additional assistance.  Note: Portions of this note were generated with Lobbyist. Dictation errors may occur despite best attempts at proofreading.  Teodoro Spray, NP

## 2022-02-27 ENCOUNTER — Non-Acute Institutional Stay: Payer: Commercial Managed Care - HMO | Admitting: Hospice

## 2022-02-27 DIAGNOSIS — Z515 Encounter for palliative care: Secondary | ICD-10-CM

## 2022-02-27 DIAGNOSIS — F0390 Unspecified dementia without behavioral disturbance: Secondary | ICD-10-CM

## 2022-02-27 DIAGNOSIS — R1310 Dysphagia, unspecified: Secondary | ICD-10-CM

## 2022-02-27 DIAGNOSIS — F39 Unspecified mood [affective] disorder: Secondary | ICD-10-CM

## 2022-02-27 NOTE — Progress Notes (Signed)
[facility] Consult Note Telephone: [phone]  Fax: [phone]  PATIENT NAME: Carolyn Parrish DOB: August 30, 1940 MRN: [id number]  PRIMARY CARE PROVIDER:   Pcp, No No referring provider defined for this encounter.  REFERRING PROVIDER: Pcp, No No referring provider defined for this encounter.  Carolyn Parrish [facility] NP  RESPONSIBLE PARTY: Extended Emergency Contact Information Primary Emergency Contact: Carolyn, Parrish Mobile Phone: [phone] Relation: Daughter   Contact Information     Name Relation Home Work Mobile   [place], [place] Daughter   [phone]       Visit is to build trust and highlight Palliative Medicine as specialized medical care for people living with serious illness, aimed at facilitating better quality of life through symptoms relief, assisting with advance care planning and complex medical decision making.    RECOMMENDATIONS/PLAN:   Advance Care Planning/Code Status: Patient is a DO NOT RESUSCITATE.  Goals of Care: Goals of care include to maximize quality of life and symptom management.  MOST selections include limited additional intervention, IV fluids for defined trial period, antibiotics if indicated, feeding tube for defined trial period.   Previous discussion with [name] - not interested in hospice service at this time.  She wants patient to have jelly sandwich; pt on pured diet . She said she has discussed with management who approved it but not closely implemented.   Palliative care team will continue to support patient, patient's family, and medical team.  Symptom management/Plan:  Dementia: Advanced, bed bound, hoyer lift for all transfers; now has a [place] wheelchair, OT ongoing for positioning and fine motor skills. Patient needing more cueing to swallow food, worsening memory loss and confusion in line with Dementia disease trajectory. FAST 7c, can no longer   Continue ongoing  supportive care.  Fall/safety precautions.   Mood disorder: Continue with Depakote sprinkles, Olanzapine.  Psych consult as planned.  Routine CBC CMP, Depakote level.  Dysphagia: Continue pured diet.  Aspiration precautions in place. ST consult as needed  Follow up: Palliative care will continue to follow for complex medical decision making, advance care planning, and clarification of goals. Return 6 weeks or prn. Encouraged to call provider sooner with any concerns.  CHIEF COMPLAINT: Palliative follow up visit  HISTORY OF PRESENT ILLNESS:  Carolyn Parrish a 82 y.o. 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 History of breast cancer, CAD, DM2, mood disorder, schizophrenia.  History obtained from review of EMR, discussion with primary team, family and/or patient. Records reviewed and summarized above. All 10 point systems reviewed and are negative except as documented in history of present illness above. Review and summarization of Epic records shows history from other than patient.   Palliative Care was asked to follow this patient o help address complex decision making in the context of advance care planning and goals of care clarification.    PERTINENT MEDICATIONS:  Outpatient Encounter Medications as of 02/27/2022  Medication Sig   acetaminophen (TYLENOL) 500 MG tablet Take 500 mg by mouth every 4 (four) hours as needed.   alum & mag hydroxide-simeth (MAALOX/MYLANTA) 200-200-20 MG/5ML suspension Take 30 mLs by mouth every 4 (four) hours as needed for indigestion or heartburn.   Amino Acids-Protein Hydrolys (FEEDING SUPPLEMENT, PRO-STAT SUGAR FREE 64,) LIQD Take 30 mLs by mouth 3 (three) times daily with meals.   anastrozole (ARIMIDEX) 1 MG tablet Take 1 tablet (1 mg total) by mouth daily.   aspirin EC 81 MG  tablet Take 81 mg by mouth daily.   bisacodyl (DULCOLAX) 10 MG suppository Place 10 mg rectally as needed for  moderate constipation.   camphor-menthol (SARNA) lotion Apply 1 application topically 2 (two) times daily as needed for itching.   Cholecalciferol (VITAMIN D3) 25 MCG (1000 UT) CAPS Take 1 capsule by mouth daily.   DIVALPROEX SODIUM ER PO Take 125 mg by mouth daily. Give 4 capsules by mouth daily (500 mg)   docusate sodium (COLACE) 100 MG capsule Take 100 mg by mouth 2 (two) times daily. For constipation   magnesium hydroxide (MILK OF MAGNESIA) 400 MG/5ML suspension Take 30 mLs by mouth daily as needed for mild constipation.   Nutritional Supplements (ENSURE ENLIVE PO) Take 237 mLs by mouth 4 (four) times daily.   OLANZapine (ZYPREXA) 5 MG tablet Take 1 tablet (5 mg total) by mouth at bedtime.   oxyCODONE (OXY IR/ROXICODONE) 5 MG immediate release tablet Take 5 mg by mouth every 4 (four) hours as needed for pain.   polyethylene glycol (MIRALAX / GLYCOLAX) packet Take 17 g by mouth daily.   sennosides-docusate sodium (SENOKOT-S) 8.6-50 MG tablet Take 1 tablet by mouth at bedtime.   Sodium Phosphates (RA SALINE ENEMA) 19-7 GM/118ML ENEM Place 1 each rectally as needed (for constipation).   vitamin B-12 (CYANOCOBALAMIN) 500 MCG tablet Take 500 mcg by mouth daily.   No facility-administered encounter medications on file as of 02/27/2022.    HOSPICE ELIGIBILITY/DIAGNOSIS: TBD  PAST MEDICAL HISTORY:  Past Medical History:  Diagnosis Date   Cancer ([place])    BREAST CANCER   CHF (congestive heart failure) (HCC)    DM (diabetes mellitus), secondary, with peripheral vascular complications (HCC)    Hyperlipidemia    Hypertension    Schizoaffective disorder ([place])    Stroke ([place])       ALLERGIES:  Allergies  Allergen Reactions   Atorvastatin     UNSPECIFIED REACTION      I spent 35 minutes providing this consultation; time includes spent with patient/family, chart review and documentation. More than 50% of the time in this consultation was spent on care coordination.  Thank you for the  opportunity to participate in the care of Carolyn Parrish Please call our office at [phone] if we can be of additional assistance.  Note: Portions of this note were generated with Lobbyist. Dictation errors may occur despite best attempts at proofreading.  Teodoro Spray, NP

## 2022-04-07 ENCOUNTER — Non-Acute Institutional Stay: Payer: Medicare Other | Admitting: Hospice

## 2022-04-07 DIAGNOSIS — R1310 Dysphagia, unspecified: Secondary | ICD-10-CM

## 2022-04-07 DIAGNOSIS — Z515 Encounter for palliative care: Secondary | ICD-10-CM

## 2022-04-07 DIAGNOSIS — F39 Unspecified mood [affective] disorder: Secondary | ICD-10-CM

## 2022-04-07 DIAGNOSIS — F0390 Unspecified dementia without behavioral disturbance: Secondary | ICD-10-CM

## 2022-04-07 NOTE — Progress Notes (Signed)
[facility] Consult Note Telephone: [phone]  Fax: [phone]  PATIENT NAME: Carolyn Parrish DOB: 10/12/1940 MRN: [id number]  PRIMARY CARE PROVIDER:   Marlane Mingle [facility] NP  REFERRING PROVIDER:   Marlane Mingle [facility] NP  RESPONSIBLE PARTY: Extended Emergency Contact Information Primary Emergency Contact: Micheal, Mathwig Mobile Phone: [phone] Relation: Daughter   Contact Information     Name Relation Home Work Mobile   Melainie, [place] Daughter   [phone]       Visit is to build trust and highlight Palliative Medicine as specialized medical care for people living with serious illness, aimed at facilitating better quality of life through symptoms relief, assisting with advance care planning and complex medical decision making.    RECOMMENDATIONS/PLAN:   Advance Care Planning/Code Status: Patient is a DO NOT RESUSCITATE.  Goals of Care: Goals of care include to maximize quality of life and symptom management.  MOST selections include limited additional intervention, IV fluids for defined trial period, antibiotics if indicated, feeding tube for defined trial period.   Previous discussion with [name] - not interested in hospice service at this time.  She wants patient to have jelly sandwich despite the fact that patient is on pured diet.  Management is aware.   Palliative care team will continue to support patient, patient's family, and medical team.  Symptom management/Plan:  Dementia: Advanced with severe memory loss. FAST 7B.  OT reports patient participating in restorative exercises, working with arm bike and pegboard while in supine or sitting position. Continue ongoing supportive care.  Fall/safety precautions.   Mood disorder: Stable.  Continue with Depakote sprinkles, Olanzapine.  Psych consult as planned.  Routine CBC CMP, Depakote level.  Dysphagia: Continue pured diet.  Aspiration precautions in  place. ST consult as needed  Follow up: Palliative care will continue to follow for complex medical decision making, advance care planning, and clarification of goals. Return 6 weeks or prn. Encouraged to call provider sooner with any concerns.  CHIEF COMPLAINT: Palliative follow up visit  HISTORY OF PRESENT ILLNESS:  Carolyn Parrish a 82 y.o. 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 History of breast cancer, CAD, DM2, mood disorder, schizophrenia.  Patient denies pain/discomfort, FLACC 0, nursing with no concerns today.  History obtained from review of EMR, discussion with primary team, family and/or patient. Records reviewed and summarized above. All 10 point systems reviewed and are negative except as documented in history of present illness above. Review and summarization of Epic records shows history from other than patient.   Palliative Care was asked to follow this patient o help address complex decision making in the context of advance care planning and goals of care clarification.    PERTINENT MEDICATIONS:  Outpatient Encounter Medications as of 04/07/2022  Medication Sig   acetaminophen (TYLENOL) 500 MG tablet Take 500 mg by mouth every 4 (four) hours as needed.   alum & mag hydroxide-simeth (MAALOX/MYLANTA) 200-200-20 MG/5ML suspension Take 30 mLs by mouth every 4 (four) hours as needed for indigestion or heartburn.   Amino Acids-Protein Hydrolys (FEEDING SUPPLEMENT, PRO-STAT SUGAR FREE 64,) LIQD Take 30 mLs by mouth 3 (three) times daily with meals.   anastrozole (ARIMIDEX) 1 MG tablet Take 1 tablet (1 mg total) by mouth daily.   aspirin EC 81 MG tablet Take 81 mg by mouth daily.   bisacodyl (DULCOLAX) 10 MG suppository Place 10 mg rectally as needed for moderate constipation.  camphor-menthol (SARNA) lotion Apply 1 application topically 2 (two) times daily as needed for itching.    Cholecalciferol (VITAMIN D3) 25 MCG (1000 UT) CAPS Take 1 capsule by mouth daily.   DIVALPROEX SODIUM ER PO Take 125 mg by mouth daily. Give 4 capsules by mouth daily (500 mg)   docusate sodium (COLACE) 100 MG capsule Take 100 mg by mouth 2 (two) times daily. For constipation   magnesium hydroxide (MILK OF MAGNESIA) 400 MG/5ML suspension Take 30 mLs by mouth daily as needed for mild constipation.   Nutritional Supplements (ENSURE ENLIVE PO) Take 237 mLs by mouth 4 (four) times daily.   OLANZapine (ZYPREXA) 5 MG tablet Take 1 tablet (5 mg total) by mouth at bedtime.   oxyCODONE (OXY IR/ROXICODONE) 5 MG immediate release tablet Take 5 mg by mouth every 4 (four) hours as needed for pain.   polyethylene glycol (MIRALAX / GLYCOLAX) packet Take 17 g by mouth daily.   sennosides-docusate sodium (SENOKOT-S) 8.6-50 MG tablet Take 1 tablet by mouth at bedtime.   Sodium Phosphates (RA SALINE ENEMA) 19-7 GM/118ML ENEM Place 1 each rectally as needed (for constipation).   vitamin B-12 (CYANOCOBALAMIN) 500 MCG tablet Take 500 mcg by mouth daily.   No facility-administered encounter medications on file as of 04/07/2022.    HOSPICE ELIGIBILITY/DIAGNOSIS: TBD  PAST MEDICAL HISTORY:  Past Medical History:  Diagnosis Date   Cancer ([place])    BREAST CANCER   CHF (congestive heart failure) (HCC)    DM (diabetes mellitus), secondary, with peripheral vascular complications (HCC)    Hyperlipidemia    Hypertension    Schizoaffective disorder ([place])    Stroke ([place])       ALLERGIES:  Allergies  Allergen Reactions   Atorvastatin     UNSPECIFIED REACTION      I spent 35 minutes providing this consultation; time includes spent with patient/family, chart review and documentation. More than 50% of the time in this consultation was spent on care coordination.  Thank you for the opportunity to participate in the care of Carolyn Parrish Please call our office at [phone] if we can be of additional  assistance.  Note: Portions of this note were generated with Lobbyist. Dictation errors may occur despite best attempts at proofreading.  Teodoro Spray, NP

## 2022-04-27 ENCOUNTER — Non-Acute Institutional Stay: Payer: Medicare Other | Admitting: Hospice

## 2022-04-27 DIAGNOSIS — R1310 Dysphagia, unspecified: Secondary | ICD-10-CM

## 2022-04-27 DIAGNOSIS — F39 Unspecified mood [affective] disorder: Secondary | ICD-10-CM

## 2022-04-27 DIAGNOSIS — F0390 Unspecified dementia without behavioral disturbance: Secondary | ICD-10-CM

## 2022-04-27 DIAGNOSIS — Z515 Encounter for palliative care: Secondary | ICD-10-CM

## 2022-04-27 NOTE — Progress Notes (Signed)
[facility] Consult Note Telephone: [phone]  Fax: [phone]  PATIENT NAME: Carolyn Parrish DOB: September 30, 1940 MRN: [id number]  PRIMARY CARE PROVIDER:   Marlane Mingle [facility] NP  REFERRING PROVIDER:   Marlane Mingle [facility] NP  RESPONSIBLE PARTY: Extended Emergency Contact Information Primary Emergency Contact: [place], [place] Mobile Phone: [phone] Relation: Daughter   Contact Information     Name Relation Home Work Mobile   Sh[place], [place] Daughter   [phone]       Visit is to build trust and highlight Palliative Medicine as specialized medical care for people living with serious illness, aimed at facilitating better quality of life through symptoms relief, assisting with advance care planning and complex medical decision making.  NP called and left voicemail with updates as requested, and callback number.  RECOMMENDATIONS/PLAN:   Advance Care Planning/Code Status: Patient is a DO NOT RESUSCITATE.  Goals of Care: Goals of care include to maximize quality of life and symptom management.  MOST selections include limited additional intervention, IV fluids for defined trial period, antibiotics if indicated, feeding tube for defined trial period.   Previous discussion with [name] - not interested in hospice service at this time.  She wants patient to have jelly sandwich despite the fact that patient is on pured diet.  Management is aware.   Palliative care team will continue to support patient, patient's family, and medical team.  Symptom management/Plan:  Dementia: severe memory loss, impoverished though, limited language. FAST 7B.  Continue with restorative exercises, working with arm bike and pegboard while in supine or sitting position. Continue ongoing supportive care.  Fall/safety precautions.   Mood disorder: Stable.  Continue with Depakote sprinkles, Olanzapine.  Psych consult as planned.  Routine CBC CMP,  Depakote level.  Dysphagia: Pured diet.   She continues to get peanut butter and jelly sandwich per daughter's request. Aspiration precautions in place. ST consult as needed  Follow up: Palliative care will continue to follow for complex medical decision making, advance care planning, and clarification of goals. Return 6 weeks or prn. Encouraged to call provider sooner with any concerns.  CHIEF COMPLAINT: Palliative follow up visit  HISTORY OF PRESENT ILLNESS:  Carolyn Parrish a 82 y.o. 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 History of breast cancer, CAD, DM2, mood disorder, schizophrenia.  Patient denies pain/discomfort, FLACC 0, nursing with no concerns today, no hospitalization since last visit.  History obtained from review of EMR, discussion with primary team, family and/or patient. Records reviewed and summarized above. All 10 point systems reviewed and are negative except as documented in history of present illness above. Review and summarization of Epic records shows history from other than patient.   Palliative Care was asked to follow this patient o help address complex decision making in the context of advance care planning and goals of care clarification.    PERTINENT MEDICATIONS:  Outpatient Encounter Medications as of 04/27/2022  Medication Sig   acetaminophen (TYLENOL) 500 MG tablet Take 500 mg by mouth every 4 (four) hours as needed.   alum & mag hydroxide-simeth (MAALOX/MYLANTA) 200-200-20 MG/5ML suspension Take 30 mLs by mouth every 4 (four) hours as needed for indigestion or heartburn.   Amino Acids-Protein Hydrolys (FEEDING SUPPLEMENT, PRO-STAT SUGAR FREE 64,) LIQD Take 30 mLs by mouth 3 (three) times daily with meals.   anastrozole (ARIMIDEX) 1 MG tablet Take 1 tablet (1 mg total) by mouth daily.  aspirin EC 81 MG tablet Take 81 mg by mouth daily.   bisacodyl (DULCOLAX) 10 MG  suppository Place 10 mg rectally as needed for moderate constipation.   camphor-menthol (SARNA) lotion Apply 1 application topically 2 (two) times daily as needed for itching.   Cholecalciferol (VITAMIN D3) 25 MCG (1000 UT) CAPS Take 1 capsule by mouth daily.   DIVALPROEX SODIUM ER PO Take 125 mg by mouth daily. Give 4 capsules by mouth daily (500 mg)   docusate sodium (COLACE) 100 MG capsule Take 100 mg by mouth 2 (two) times daily. For constipation   magnesium hydroxide (MILK OF MAGNESIA) 400 MG/5ML suspension Take 30 mLs by mouth daily as needed for mild constipation.   Nutritional Supplements (ENSURE ENLIVE PO) Take 237 mLs by mouth 4 (four) times daily.   OLANZapine (ZYPREXA) 5 MG tablet Take 1 tablet (5 mg total) by mouth at bedtime.   oxyCODONE (OXY IR/ROXICODONE) 5 MG immediate release tablet Take 5 mg by mouth every 4 (four) hours as needed for pain.   polyethylene glycol (MIRALAX / GLYCOLAX) packet Take 17 g by mouth daily.   sennosides-docusate sodium (SENOKOT-S) 8.6-50 MG tablet Take 1 tablet by mouth at bedtime.   Sodium Phosphates (RA SALINE ENEMA) 19-7 GM/118ML ENEM Place 1 each rectally as needed (for constipation).   vitamin B-12 (CYANOCOBALAMIN) 500 MCG tablet Take 500 mcg by mouth daily.   No facility-administered encounter medications on file as of 04/27/2022.    HOSPICE ELIGIBILITY/DIAGNOSIS: TBD  PAST MEDICAL HISTORY:  Past Medical History:  Diagnosis Date   Cancer ([place])    BREAST CANCER   CHF (congestive heart failure) (HCC)    DM (diabetes mellitus), secondary, with peripheral vascular complications (HCC)    Hyperlipidemia    Hypertension    Schizoaffective disorder ([place])    Stroke ([place])       ALLERGIES:  Allergies  Allergen Reactions   Atorvastatin     UNSPECIFIED REACTION      I spent 35 minutes providing this consultation; time includes spent with patient/family, chart review and documentation. More than 50% of the time in this consultation was spent  on care coordination.  Thank you for the opportunity to participate in the care of Carolyn Parrish Please call our office at [phone] if we can be of additional assistance.  Note: Portions of this note were generated with Lobbyist. Dictation errors may occur despite best attempts at proofreading.  Teodoro Spray, NP

## 2022-06-07 ENCOUNTER — Non-Acute Institutional Stay: Payer: Medicare Other | Admitting: Hospice

## 2022-06-07 DIAGNOSIS — F39 Unspecified mood [affective] disorder: Secondary | ICD-10-CM

## 2022-06-07 DIAGNOSIS — Z515 Encounter for palliative care: Secondary | ICD-10-CM

## 2022-06-07 DIAGNOSIS — F0390 Unspecified dementia without behavioral disturbance: Secondary | ICD-10-CM

## 2022-06-07 DIAGNOSIS — R1310 Dysphagia, unspecified: Secondary | ICD-10-CM

## 2022-06-07 NOTE — Progress Notes (Signed)
Therapist, nutritional Palliative Care Consult Note Telephone: [phone]  Fax: [phone]  PATIENT NAME: Carolyn Parrish DOB: August 31, 1940 MRN: [id number]  PRIMARY CARE PROVIDER:   Primitivo Gauze [employer] NP  REFERRING PROVIDER:   Primitivo Gauze [employer] NP  RESPONSIBLE PARTY: Extended Emergency Contact Information Primary Emergency Contact: Azadeh, Hyder Mobile Phone: [phone] Relation: Daughter   Contact Information     Name Relation Home Work Mobile   Donnisha, Besecker Daughter   [phone]       Visit is to build trust and highlight Palliative Medicine as specialized medical care for people living with serious illness, aimed at facilitating better quality of life through symptoms relief, assisting with advance care planning and complex medical decision making.     RECOMMENDATIONS/PLAN:   Advance Care Planning/Code Status: Patient is a DO NOT RESUSCITATE.  Goals of Care: Goals of care include to maximize quality of life and symptom management.  MOST selections include limited additional intervention, IV fluids for defined trial period, antibiotics if indicated, feeding tube for defined trial period.   Previous discussion with Erie Noe - not interested in hospice service at this time.  She wants patient to have jelly sandwich despite the fact that patient is on pured diet.  Management is aware.   Palliative care team will continue to support patient, patient's family, and medical team.  Symptom management/Plan:  Dementia: Advanced.  Out of bed daily, restorative exercises to optimize wellbeing. Continue ongoing supportive care.  Fall/safety precautions Monitor for functional decline and personality changes.    Mood disorder: Stable.  Continue with Depakote sprinkles, Olanzapine.  Psych consult as planned.  Routine CBC CMP, Depakote level.  Dysphagia: Pured diet.   She continues to get peanut butter and jelly sandwich per daughter's request.  Aspiration precautions in place. ST consult as needed  Follow up: Palliative care will continue to follow for complex medical decision making, advance care planning, and clarification of goals. Return 6 weeks or prn. Encouraged to call provider sooner with any concerns.  CHIEF COMPLAINT: Palliative follow up visit  HISTORY OF PRESENT ILLNESS:  Carolyn Parrish a 82 y.o. 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 History of breast cancer, CAD, DM2, mood disorder, schizophrenia.  Patient denies pain/discomfort, FLACC 0, nursing with no concerns today, no hospitalization since last visit.  History obtained from review of EMR, discussion with primary team, family and/or patient. Records reviewed and summarized above. All 10 point systems reviewed and are negative except as documented in history of present illness above. Review and summarization of Epic records shows history from other than patient.   Palliative Care was asked to follow this patient o help address complex decision making in the context of advance care planning and goals of care clarification.    PERTINENT MEDICATIONS:  Outpatient Encounter Medications as of 06/07/2022  Medication Sig   acetaminophen (TYLENOL) 500 MG tablet Take 500 mg by mouth every 4 (four) hours as needed.   alum & mag hydroxide-simeth (MAALOX/MYLANTA) 200-200-20 MG/5ML suspension Take 30 mLs by mouth every 4 (four) hours as needed for indigestion or heartburn.   Amino Acids-Protein Hydrolys (FEEDING SUPPLEMENT, PRO-STAT SUGAR FREE 64,) LIQD Take 30 mLs by mouth 3 (three) times daily with meals.   anastrozole (ARIMIDEX) 1 MG tablet Take 1 tablet (1 mg total) by mouth daily.   aspirin EC 81 MG tablet Take 81 mg by mouth daily.   bisacodyl (DULCOLAX) 10 MG  suppository Place 10 mg rectally as needed for moderate constipation.   camphor-menthol (SARNA) lotion Apply 1 application  topically 2 (two) times daily as needed for itching.   Cholecalciferol (VITAMIN D3) 25 MCG (1000 UT) CAPS Take 1 capsule by mouth daily.   DIVALPROEX SODIUM ER PO Take 125 mg by mouth daily. Give 4 capsules by mouth daily (500 mg)   docusate sodium (COLACE) 100 MG capsule Take 100 mg by mouth 2 (two) times daily. For constipation   magnesium hydroxide (MILK OF MAGNESIA) 400 MG/5ML suspension Take 30 mLs by mouth daily as needed for mild constipation.   Nutritional Supplements (ENSURE ENLIVE PO) Take 237 mLs by mouth 4 (four) times daily.   OLANZapine (ZYPREXA) 5 MG tablet Take 1 tablet (5 mg total) by mouth at bedtime.   oxyCODONE (OXY IR/ROXICODONE) 5 MG immediate release tablet Take 5 mg by mouth every 4 (four) hours as needed for pain.   polyethylene glycol (MIRALAX / GLYCOLAX) packet Take 17 g by mouth daily.   sennosides-docusate sodium (SENOKOT-S) 8.6-50 MG tablet Take 1 tablet by mouth at bedtime.   Sodium Phosphates (RA SALINE ENEMA) 19-7 GM/118ML ENEM Place 1 each rectally as needed (for constipation).   vitamin B-12 (CYANOCOBALAMIN) 500 MCG tablet Take 500 mcg by mouth daily.   No facility-administered encounter medications on file as of 06/07/2022.    HOSPICE ELIGIBILITY/DIAGNOSIS: TBD  PAST MEDICAL HISTORY:  Past Medical History:  Diagnosis Date   Cancer (HCC)    BREAST CANCER   CHF (congestive heart failure) (HCC)    DM (diabetes mellitus), secondary, with peripheral vascular complications (HCC)    Hyperlipidemia    Hypertension    Schizoaffective disorder (HCC)    Stroke (HCC)       ALLERGIES:  Allergies  Allergen Reactions   Atorvastatin     UNSPECIFIED REACTION      I spent 35 minutes providing this consultation; time includes spent with patient/family, chart review and documentation. More than 50% of the time in this consultation was spent on care coordination.  Thank you for the opportunity to participate in the care of Carolyn Parrish Please call our office  at [phone] if we can be of additional assistance.  Note: Portions of this note were generated with Scientist, clinical (histocompatibility and immunogenetics). Dictation errors may occur despite best attempts at proofreading.  Rosaura Carpenter, NP

## 2022-07-20 ENCOUNTER — Non-Acute Institutional Stay: Payer: Medicare Other | Admitting: Hospice

## 2022-07-20 DIAGNOSIS — R1310 Dysphagia, unspecified: Secondary | ICD-10-CM

## 2022-07-20 DIAGNOSIS — M545 Low back pain, unspecified: Secondary | ICD-10-CM

## 2022-07-20 DIAGNOSIS — Z515 Encounter for palliative care: Secondary | ICD-10-CM

## 2022-07-20 DIAGNOSIS — F39 Unspecified mood [affective] disorder: Secondary | ICD-10-CM

## 2022-07-20 DIAGNOSIS — F0390 Unspecified dementia without behavioral disturbance: Secondary | ICD-10-CM

## 2022-07-20 NOTE — Progress Notes (Signed)
Therapist, nutritional Palliative Care Consult Note Telephone: [phone]  Fax: [phone]  PATIENT NAME: Carolyn Parrish DOB: [birth year] MRN: [id number]  PRIMARY CARE PROVIDER:   Primitivo Gauze [employer] NP  REFERRING PROVIDER:   Primitivo Gau[facility] [employer] NP  RESPONSIBLE PARTY: Extended Emergency Contact Information Primary Emergency Contact: Peg, Salminen Mobile Phone: [phone] Relation: Daughter   Contact Information     Name Relation Home Work Mobile   Machel, Leedham Daughter   [phone]       Visit is to build trust and highlight Palliative Medicine as specialized medical care for people living with serious illness, aimed at facilitating better quality of life through symptoms relief, assisting with advance care planning and complex medical decision making.     RECOMMENDATIONS/PLAN:   Advance Care Planning/Code Status: Patient is a DO NOT RESUSCITATE.  Goals of Care: Goals of care include to maximize quality of life and symptom management.  MOST selections include limited additional intervention, IV fluids for defined trial period, antibiotics if indicated, feeding tube for defined trial period.   Previous discussion with Erie Noe - not interested in hospice service at this time.  She wants patient to have jelly sandwich despite the fact that patient is on pured diet.  Management is aware.   Palliative care team will continue to support patient, patient's family, and medical team.  Symptom management/Plan:  Low back pain: chronic, intermittent.  Tylenol 500 mg by mouth BID; Tylenol 500 mg by mouth every 8 hours prn pain. Out of bed daily.   Dementia: Advanced.  Out of bed daily, restorative exercises to optimize wellbeing. Continue ongoing supportive care.  Fall/safety precautions Monitor for functional decline and personality changes.   Mood disorder: Managed with Depakote sprinkles, Olanzapine.  Psych consult as planned.  Routine CBC  CMP, Depakote level.  Dysphagia: Pured diet.   She continues to get peanut butter and jelly sandwich per daughter's request. Aspiration precautions in place. ST consult as needed.  138.4 Ibs 07/17/22 143 Ibs 07/03/22 140 Ibs 06/26/22 141.8 Ibs  5/13 Continue Magic cup, Prostat Ensure. Offer assistance during meals to ensure adequate oral intake.   Follow up: Palliative care will continue to follow for complex medical decision making, advance care planning, and clarification of goals. Return 6 weeks or prn. Encouraged to call provider sooner with any concerns.  CHIEF COMPLAINT: Palliative follow up visit  HISTORY OF PRESENT ILLNESS:  Carolyn Parrish a 82 y.o. 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 History of breast cancer on Anastrozole, CAD, DM2, mood disorder, schizophrenia.  Patient endorses back pain.  History obtained from review of EMR, discussion with primary team, family and/or patient. Records reviewed and summarized above. All 10 point systems reviewed and are negative except as documented in history of present illness above. Review and summarization of Epic records shows history from other than patient.   Palliative Care was asked to follow this patient o help address complex decision making in the context of advance care planning and goals of care clarification.    PERTINENT MEDICATIONS:  Outpatient Encounter Medications as of 07/20/2022  Medication Sig   acetaminophen (TYLENOL) 500 MG tablet Take 500 mg by mouth every 4 (four) hours as needed.   alum & mag hydroxide-simeth (MAALOX/MYLANTA) 200-200-20 MG/5ML suspension Take 30 mLs by mouth every 4 (four) hours as needed for indigestion or heartburn.   Amino Acids-Protein Hydrolys (FEEDING SUPPLEMENT, PRO-STAT SUGAR FREE 64,) LIQD Take  30 mLs by mouth 3 (three) times daily with meals.   anastrozole (ARIMIDEX) 1 MG tablet Take 1 tablet (1 mg  total) by mouth daily.   aspirin EC 81 MG tablet Take 81 mg by mouth daily.   bisacodyl (DULCOLAX) 10 MG suppository Place 10 mg rectally as needed for moderate constipation.   camphor-menthol (SARNA) lotion Apply 1 application topically 2 (two) times daily as needed for itching.   Cholecalciferol (VITAMIN D3) 25 MCG (1000 UT) CAPS Take 1 capsule by mouth daily.   DIVALPROEX SODIUM ER PO Take 125 mg by mouth daily. Give 4 capsules by mouth daily (500 mg)   docusate sodium (COLACE) 100 MG capsule Take 100 mg by mouth 2 (two) times daily. For constipation   magnesium hydroxide (MILK OF MAGNESIA) 400 MG/5ML suspension Take 30 mLs by mouth daily as needed for mild constipation.   Nutritional Supplements (ENSURE ENLIVE PO) Take 237 mLs by mouth 4 (four) times daily.   OLANZapine (ZYPREXA) 5 MG tablet Take 1 tablet (5 mg total) by mouth at bedtime.   oxyCODONE (OXY IR/ROXICODONE) 5 MG immediate release tablet Take 5 mg by mouth every 4 (four) hours as needed for pain.   polyethylene glycol (MIRALAX / GLYCOLAX) packet Take 17 g by mouth daily.   sennosides-docusate sodium (SENOKOT-S) 8.6-50 MG tablet Take 1 tablet by mouth at bedtime.   Sodium Phosphates (RA SALINE ENEMA) 19-7 GM/118ML ENEM Place 1 each rectally as needed (for constipation).   vitamin B-12 (CYANOCOBALAMIN) 500 MCG tablet Take 500 mcg by mouth daily.   No facility-administered encounter medications on file as of 07/20/2022.    HOSPICE ELIGIBILITY/DIAGNOSIS: TBD  PAST MEDICAL HISTORY:  Past Medical History:  Diagnosis Date   Cancer (HCC)    BREAST CANCER   CHF (congestive heart failure) (HCC)    DM (diabetes mellitus), secondary, with peripheral vascular complications (HCC)    Hyperlipidemia    Hypertension    Schizoaffective disorder (HCC)    Stroke (HCC)       ALLERGIES:  Allergies  Allergen Reactions   Atorvastatin     UNSPECIFIED REACTION      I spent 35 minutes providing this consultation; time includes spent with  patient/family, chart review and documentation. More than 50% of the time in this consultation was spent on care coordination.  Thank you for the opportunity to participate in the care of Carolyn Parrish Please call our office at [phone] if we can be of additional assistance.  Note: Portions of this note were generated with Scientist, clinical (histocompatibility and immunogenetics). Dictation errors may occur despite best attempts at proofreading.  Rosaura Carpenter, NP

## 2023-05-30 ENCOUNTER — Encounter (HOSPITAL_COMMUNITY): Payer: Self-pay | Admitting: Pulmonary Disease

## 2023-05-30 ENCOUNTER — Other Ambulatory Visit: Payer: Self-pay

## 2023-05-30 ENCOUNTER — Emergency Department (HOSPITAL_COMMUNITY)

## 2023-05-30 ENCOUNTER — Inpatient Hospital Stay (HOSPITAL_COMMUNITY)

## 2023-05-30 ENCOUNTER — Inpatient Hospital Stay (HOSPITAL_COMMUNITY)
Admission: EM | Admit: 2023-05-30 | Discharge: 2023-06-12 | DRG: 871 | Disposition: A | Discharge status: Skilled Nursing Facility | Source: Skilled Nursing Facility | Attending: Internal Medicine | Admitting: Internal Medicine

## 2023-05-30 DIAGNOSIS — A4151 Sepsis due to Escherichia coli [E. coli]: Principal | ICD-10-CM | POA: Diagnosis present

## 2023-05-30 DIAGNOSIS — I69398 Other sequelae of cerebral infarction: Secondary | ICD-10-CM

## 2023-05-30 DIAGNOSIS — F259 Schizoaffective disorder, unspecified: Secondary | ICD-10-CM | POA: Diagnosis present

## 2023-05-30 DIAGNOSIS — R29810 Facial weakness: Secondary | ICD-10-CM | POA: Diagnosis present

## 2023-05-30 DIAGNOSIS — E1165 Type 2 diabetes mellitus with hyperglycemia: Secondary | ICD-10-CM | POA: Diagnosis present

## 2023-05-30 DIAGNOSIS — I110 Hypertensive heart disease with heart failure: Secondary | ICD-10-CM | POA: Diagnosis present

## 2023-05-30 DIAGNOSIS — Z66 Do not resuscitate: Secondary | ICD-10-CM | POA: Diagnosis present

## 2023-05-30 DIAGNOSIS — Z1152 Encounter for screening for COVID-19: Secondary | ICD-10-CM | POA: Diagnosis not present

## 2023-05-30 DIAGNOSIS — N390 Urinary tract infection, site not specified: Secondary | ICD-10-CM | POA: Diagnosis present

## 2023-05-30 DIAGNOSIS — J9601 Acute respiratory failure with hypoxia: Secondary | ICD-10-CM | POA: Diagnosis present

## 2023-05-30 DIAGNOSIS — I5032 Chronic diastolic (congestive) heart failure: Secondary | ICD-10-CM | POA: Diagnosis present

## 2023-05-30 DIAGNOSIS — Z923 Personal history of irradiation: Secondary | ICD-10-CM

## 2023-05-30 DIAGNOSIS — Z79811 Long term (current) use of aromatase inhibitors: Secondary | ICD-10-CM | POA: Diagnosis not present

## 2023-05-30 DIAGNOSIS — E873 Alkalosis: Secondary | ICD-10-CM | POA: Diagnosis present

## 2023-05-30 DIAGNOSIS — E162 Hypoglycemia, unspecified: Principal | ICD-10-CM

## 2023-05-30 DIAGNOSIS — R569 Unspecified convulsions: Secondary | ICD-10-CM

## 2023-05-30 DIAGNOSIS — E785 Hyperlipidemia, unspecified: Secondary | ICD-10-CM | POA: Diagnosis present

## 2023-05-30 DIAGNOSIS — Z6823 Body mass index (BMI) 23.0-23.9, adult: Secondary | ICD-10-CM

## 2023-05-30 DIAGNOSIS — E1151 Type 2 diabetes mellitus with diabetic peripheral angiopathy without gangrene: Secondary | ICD-10-CM | POA: Diagnosis present

## 2023-05-30 DIAGNOSIS — E876 Hypokalemia: Secondary | ICD-10-CM | POA: Diagnosis present

## 2023-05-30 DIAGNOSIS — I959 Hypotension, unspecified: Secondary | ICD-10-CM | POA: Diagnosis not present

## 2023-05-30 DIAGNOSIS — L89306 Pressure-induced deep tissue damage of unspecified buttock: Secondary | ICD-10-CM | POA: Diagnosis present

## 2023-05-30 DIAGNOSIS — R4701 Aphasia: Secondary | ICD-10-CM | POA: Diagnosis present

## 2023-05-30 DIAGNOSIS — E11649 Type 2 diabetes mellitus with hypoglycemia without coma: Secondary | ICD-10-CM | POA: Diagnosis present

## 2023-05-30 DIAGNOSIS — A419 Sepsis, unspecified organism: Secondary | ICD-10-CM | POA: Diagnosis not present

## 2023-05-30 DIAGNOSIS — E43 Unspecified severe protein-calorie malnutrition: Secondary | ICD-10-CM | POA: Diagnosis present

## 2023-05-30 DIAGNOSIS — F0390 Unspecified dementia without behavioral disturbance: Secondary | ICD-10-CM | POA: Diagnosis not present

## 2023-05-30 DIAGNOSIS — G40901 Epilepsy, unspecified, not intractable, with status epilepticus: Secondary | ICD-10-CM | POA: Diagnosis present

## 2023-05-30 DIAGNOSIS — I10 Essential (primary) hypertension: Secondary | ICD-10-CM | POA: Diagnosis not present

## 2023-05-30 DIAGNOSIS — J9600 Acute respiratory failure, unspecified whether with hypoxia or hypercapnia: Secondary | ICD-10-CM | POA: Diagnosis not present

## 2023-05-30 DIAGNOSIS — Z853 Personal history of malignant neoplasm of breast: Secondary | ICD-10-CM

## 2023-05-30 DIAGNOSIS — I6930 Unspecified sequelae of cerebral infarction: Secondary | ICD-10-CM

## 2023-05-30 DIAGNOSIS — Z7982 Long term (current) use of aspirin: Secondary | ICD-10-CM

## 2023-05-30 DIAGNOSIS — E8809 Other disorders of plasma-protein metabolism, not elsewhere classified: Secondary | ICD-10-CM | POA: Diagnosis present

## 2023-05-30 DIAGNOSIS — G9341 Metabolic encephalopathy: Secondary | ICD-10-CM | POA: Diagnosis present

## 2023-05-30 DIAGNOSIS — I2510 Atherosclerotic heart disease of native coronary artery without angina pectoris: Secondary | ICD-10-CM | POA: Diagnosis present

## 2023-05-30 DIAGNOSIS — M7989 Other specified soft tissue disorders: Secondary | ICD-10-CM | POA: Diagnosis not present

## 2023-05-30 LAB — CBC
HCT: 37.2 % (ref 36.0–46.0)
Hemoglobin: 10.7 g/dL — ABNORMAL LOW (ref 12.0–15.0)
MCH: 26.7 pg (ref 26.0–34.0)
MCHC: 28.8 g/dL — ABNORMAL LOW (ref 30.0–36.0)
MCV: 92.8 fL (ref 80.0–100.0)
Platelets: 287 10*3/uL (ref 150–400)
RBC: 4.01 MIL/uL (ref 3.87–5.11)
RDW: 19 % — ABNORMAL HIGH (ref 11.5–15.5)
WBC: 13 10*3/uL — ABNORMAL HIGH (ref 4.0–10.5)
nRBC: 0 % (ref 0.0–0.2)

## 2023-05-30 LAB — I-STAT VENOUS BLOOD GAS, ED
Acid-base deficit: 3 mmol/L — ABNORMAL HIGH (ref 0.0–2.0)
Bicarbonate: 19.1 mmol/L — ABNORMAL LOW (ref 20.0–28.0)
Calcium, Ion: 0.87 mmol/L — CL (ref 1.15–1.40)
HCT: 34 % — ABNORMAL LOW (ref 36.0–46.0)
Hemoglobin: 11.6 g/dL — ABNORMAL LOW (ref 12.0–15.0)
O2 Saturation: 100 %
Potassium: 2.9 mmol/L — ABNORMAL LOW (ref 3.5–5.1)
Sodium: 144 mmol/L (ref 135–145)
TCO2: 20 mmol/L — ABNORMAL LOW (ref 22–32)
pCO2, Ven: 26.3 mmHg — ABNORMAL LOW (ref 44–60)
pH, Ven: 7.469 — ABNORMAL HIGH (ref 7.25–7.43)
pO2, Ven: 204 mmHg — ABNORMAL HIGH (ref 32–45)

## 2023-05-30 LAB — I-STAT CHEM 8, ED
BUN: 12 mg/dL (ref 8–23)
Calcium, Ion: 0.94 mmol/L — ABNORMAL LOW (ref 1.15–1.40)
Chloride: 115 mmol/L — ABNORMAL HIGH (ref 98–111)
Creatinine, Ser: 0.5 mg/dL (ref 0.44–1.00)
Glucose, Bld: 84 mg/dL (ref 70–99)
HCT: 38 % (ref 36.0–46.0)
Hemoglobin: 12.9 g/dL (ref 12.0–15.0)
Potassium: 3.7 mmol/L (ref 3.5–5.1)
Sodium: 144 mmol/L (ref 135–145)
TCO2: 20 mmol/L — ABNORMAL LOW (ref 22–32)

## 2023-05-30 LAB — DIFFERENTIAL
Abs Immature Granulocytes: 0.11 10*3/uL — ABNORMAL HIGH (ref 0.00–0.07)
Basophils Absolute: 0.1 10*3/uL (ref 0.0–0.1)
Basophils Relative: 1 %
Eosinophils Absolute: 0 10*3/uL (ref 0.0–0.5)
Eosinophils Relative: 0 %
Immature Granulocytes: 1 %
Lymphocytes Relative: 20 %
Lymphs Abs: 2.6 10*3/uL (ref 0.7–4.0)
Monocytes Absolute: 1.2 10*3/uL — ABNORMAL HIGH (ref 0.1–1.0)
Monocytes Relative: 9 %
Neutro Abs: 9 10*3/uL — ABNORMAL HIGH (ref 1.7–7.7)
Neutrophils Relative %: 69 %

## 2023-05-30 LAB — I-STAT ARTERIAL BLOOD GAS, ED
Acid-Base Excess: 1 mmol/L (ref 0.0–2.0)
Bicarbonate: 27 mmol/L (ref 20.0–28.0)
Calcium, Ion: 1.31 mmol/L (ref 1.15–1.40)
HCT: 32 % — ABNORMAL LOW (ref 36.0–46.0)
Hemoglobin: 10.9 g/dL — ABNORMAL LOW (ref 12.0–15.0)
O2 Saturation: 100 %
Patient temperature: 97.7
Potassium: 3 mmol/L — ABNORMAL LOW (ref 3.5–5.1)
Sodium: 146 mmol/L — ABNORMAL HIGH (ref 135–145)
TCO2: 28 mmol/L (ref 22–32)
pCO2 arterial: 45.3 mmHg (ref 32–48)
pH, Arterial: 7.381 (ref 7.35–7.45)
pO2, Arterial: 397 mmHg — ABNORMAL HIGH (ref 83–108)

## 2023-05-30 LAB — RESP PANEL BY RT-PCR (RSV, FLU A&B, COVID)  RVPGX2
Influenza A by PCR: NEGATIVE
Influenza B by PCR: NEGATIVE
Resp Syncytial Virus by PCR: NEGATIVE
SARS Coronavirus 2 by RT PCR: NEGATIVE

## 2023-05-30 LAB — COMPREHENSIVE METABOLIC PANEL WITH GFR
ALT: 34 U/L (ref 0–44)
AST: 39 U/L (ref 15–41)
Albumin: 2.7 g/dL — ABNORMAL LOW (ref 3.5–5.0)
Alkaline Phosphatase: 115 U/L (ref 38–126)
Anion gap: 12 (ref 5–15)
BUN: 10 mg/dL (ref 8–23)
CO2: 21 mmol/L — ABNORMAL LOW (ref 22–32)
Calcium: 8.8 mg/dL — ABNORMAL LOW (ref 8.9–10.3)
Chloride: 113 mmol/L — ABNORMAL HIGH (ref 98–111)
Creatinine, Ser: 0.64 mg/dL (ref 0.44–1.00)
GFR, Estimated: >60 mL/min (ref >60)
Glucose, Bld: 122 mg/dL — ABNORMAL HIGH (ref 70–99)
Potassium: 3.1 mmol/L — ABNORMAL LOW (ref 3.5–5.1)
Sodium: 146 mmol/L — ABNORMAL HIGH (ref 135–145)
Total Bilirubin: 1 mg/dL (ref 0.0–1.2)
Total Protein: 6.8 g/dL (ref 6.5–8.1)

## 2023-05-30 LAB — BASIC METABOLIC PANEL WITH GFR
Anion gap: 11 (ref 5–15)
BUN: 11 mg/dL (ref 8–23)
CO2: 21 mmol/L — ABNORMAL LOW (ref 22–32)
Calcium: 8.6 mg/dL — ABNORMAL LOW (ref 8.9–10.3)
Chloride: 111 mmol/L (ref 98–111)
Creatinine, Ser: 0.57 mg/dL (ref 0.44–1.00)
GFR, Estimated: >60 mL/min (ref >60)
Glucose, Bld: 171 mg/dL — ABNORMAL HIGH (ref 70–99)
Potassium: 4.1 mmol/L (ref 3.5–5.1)
Sodium: 143 mmol/L (ref 135–145)

## 2023-05-30 LAB — CBG MONITORING, ED
Glucose-Capillary: 17 mg/dL — CL (ref 70–99)
Glucose-Capillary: 218 mg/dL — ABNORMAL HIGH (ref 70–99)
Glucose-Capillary: 73 mg/dL (ref 70–99)
Glucose-Capillary: 55 mg/dL — ABNORMAL LOW (ref 70–99)
Glucose-Capillary: 117 mg/dL — ABNORMAL HIGH (ref 70–99)
Glucose-Capillary: 159 mg/dL — ABNORMAL HIGH (ref 70–99)

## 2023-05-30 LAB — URINALYSIS, ROUTINE W REFLEX MICROSCOPIC
Bilirubin Urine: NEGATIVE
Glucose, UA: >=500 mg/dL — AB
Hgb urine dipstick: NEGATIVE
Ketones, ur: 20 mg/dL — AB
Nitrite: POSITIVE — AB
Protein, ur: 30 mg/dL — AB
Specific Gravity, Urine: 1.018 (ref 1.005–1.030)
WBC, UA: >50 WBC/hpf (ref 0–5)
pH: 5 (ref 5.0–8.0)

## 2023-05-30 LAB — RAPID URINE DRUG SCREEN, HOSP PERFORMED
Amphetamines: NOT DETECTED
Barbiturates: NOT DETECTED
Benzodiazepines: POSITIVE — AB
Cocaine: NOT DETECTED
Opiates: NOT DETECTED
Tetrahydrocannabinol: NOT DETECTED

## 2023-05-30 LAB — MAGNESIUM: Magnesium: 1.8 mg/dL (ref 1.7–2.4)

## 2023-05-30 LAB — HEMOGLOBIN A1C
Hgb A1c MFr Bld: 5.1 % (ref 4.8–5.6)
Mean Plasma Glucose: 99.67 mg/dL

## 2023-05-30 LAB — ETHANOL: Alcohol, Ethyl (B): <15 mg/dL (ref <15)

## 2023-05-30 LAB — PHOSPHORUS: Phosphorus: 2.1 mg/dL — ABNORMAL LOW (ref 2.5–4.6)

## 2023-05-30 MED ORDER — SODIUM CHLORIDE 0.9% FLUSH
3.0000 mL | Freq: Two times a day (BID) | INTRAVENOUS | Status: DC
  Administered 2023-05-30 – 2023-06-12 (×27): 3 mL via INTRAVENOUS

## 2023-05-30 MED ORDER — LACTATED RINGERS IV BOLUS
500.0000 mL | Freq: Once | INTRAVENOUS | Status: AC
  Administered 2023-05-30: 500 mL via INTRAVENOUS

## 2023-05-30 MED ORDER — DEXTROSE 50 % IV SOLN
INTRAVENOUS | Status: AC
  Administered 2023-05-30: 50 mL
  Filled 2023-05-30: qty 50

## 2023-05-30 MED ORDER — LEVETIRACETAM IN NACL 1000 MG/100ML IV SOLN
1000.0000 mg | Freq: Once | INTRAVENOUS | Status: AC
  Administered 2023-05-30: 1000 mg via INTRAVENOUS
  Filled 2023-05-30: qty 100

## 2023-05-30 MED ORDER — DOCUSATE SODIUM 100 MG PO CAPS: 100.0000 mg | ORAL_CAPSULE | Freq: Two times a day (BID) | ORAL | Status: DC | PRN

## 2023-05-30 MED ORDER — HEPARIN SODIUM (PORCINE) 5000 UNIT/ML IJ SOLN
5000.0000 [IU] | Freq: Three times a day (TID) | INTRAMUSCULAR | Status: DC
Start: 2023-05-30 — End: 2023-06-12
  Administered 2023-05-30 – 2023-06-12 (×39): 5000 [IU] via SUBCUTANEOUS
  Filled 2023-05-30 (×39): qty 1

## 2023-05-30 MED ORDER — PROSOURCE TF20 ENFIT COMPATIBL EN LIQD
60.0000 mL | Freq: Every day | ENTERAL | Status: DC
  Administered 2023-05-31: 60 mL
  Filled 2023-05-30: qty 60

## 2023-05-30 MED ORDER — PROPOFOL 1000 MG/100ML IV EMUL
0.0000 ug/kg/min | INTRAVENOUS | Status: DC
Start: 2023-05-30 — End: 2023-05-30

## 2023-05-30 MED ORDER — ACETAMINOPHEN 325 MG PO TABS: 650.0000 mg | ORAL_TABLET | ORAL | Status: DC | PRN

## 2023-05-30 MED ORDER — ONDANSETRON HCL 4 MG/2ML IJ SOLN: 4.0000 mg | Freq: Four times a day (QID) | INTRAMUSCULAR | Status: DC | PRN

## 2023-05-30 MED ORDER — LACTATED RINGERS IV SOLN: INTRAVENOUS | Status: DC

## 2023-05-30 MED ORDER — CALCIUM GLUCONATE-NACL 1-0.675 GM/50ML-% IV SOLN
1.0000 g | Freq: Once | INTRAVENOUS | Status: AC
  Administered 2023-05-30: 1000 mg via INTRAVENOUS
  Filled 2023-05-30: qty 50

## 2023-05-30 MED ORDER — PROPOFOL 1000 MG/100ML IV EMUL
INTRAVENOUS | Status: AC
  Administered 2023-05-30: 10 ug/kg/min via INTRAVENOUS
  Filled 2023-05-30: qty 100

## 2023-05-30 MED ORDER — DEXTROSE-SODIUM CHLORIDE 5-0.45 % IV SOLN: INTRAVENOUS | Status: AC

## 2023-05-30 MED ORDER — DOCUSATE SODIUM 50 MG/5ML PO LIQD
100.0000 mg | Freq: Two times a day (BID) | ORAL | Status: DC
  Administered 2023-05-31 – 2023-06-01 (×2): 100 mg
  Filled 2023-05-30 (×3): qty 10

## 2023-05-30 MED ORDER — NOREPINEPHRINE 4 MG/250ML-% IV SOLN
2.0000 ug/min | INTRAVENOUS | Status: DC
  Administered 2023-05-30: 8 ug/min via INTRAVENOUS
  Administered 2023-05-31: 6 ug/min via INTRAVENOUS
  Administered 2023-05-31: 7 ug/min via INTRAVENOUS
  Administered 2023-06-01: 8 ug/min via INTRAVENOUS
  Administered 2023-06-01: 10 ug/min via INTRAVENOUS
  Administered 2023-06-01: 8 ug/min via INTRAVENOUS
  Administered 2023-06-02: 3 ug/min via INTRAVENOUS
  Administered 2023-06-02: 7 ug/min via INTRAVENOUS
  Filled 2023-05-30 (×8): qty 250

## 2023-05-30 MED ORDER — SODIUM CHLORIDE 0.9 % IV SOLN
250.0000 mL | INTRAVENOUS | Status: AC
  Administered 2023-05-30: 250 mL via INTRAVENOUS

## 2023-05-30 MED ORDER — MIDAZOLAM-SODIUM CHLORIDE 100-0.9 MG/100ML-% IV SOLN
0.5000 mg/h | INTRAVENOUS | Status: DC
  Administered 2023-05-30: 0.5 mg/h via INTRAVENOUS
  Filled 2023-05-30: qty 100

## 2023-05-30 MED ORDER — FENTANYL CITRATE PF 50 MCG/ML IJ SOSY
25.0000 ug | PREFILLED_SYRINGE | INTRAMUSCULAR | Status: DC | PRN
  Administered 2023-05-31: 50 ug via INTRAVENOUS
  Filled 2023-05-30: qty 1

## 2023-05-30 MED ORDER — NOREPINEPHRINE 4 MG/250ML-% IV SOLN
INTRAVENOUS | Status: AC
  Administered 2023-05-30: 4 mg via INTRAVENOUS
  Filled 2023-05-30: qty 250

## 2023-05-30 MED ORDER — PANTOPRAZOLE SODIUM 40 MG IV SOLR
40.0000 mg | Freq: Every day | INTRAVENOUS | Status: DC
  Administered 2023-05-30 – 2023-05-31 (×2): 40 mg via INTRAVENOUS
  Filled 2023-05-30 (×2): qty 10

## 2023-05-30 MED ORDER — KCL-LACTATED RINGERS-D5W 20 MEQ/L IV SOLN
INTRAVENOUS | Status: AC
  Filled 2023-05-30: qty 1000

## 2023-05-30 MED ORDER — POLYETHYLENE GLYCOL 3350 17 G PO PACK
17.0000 g | PACK | Freq: Every day | ORAL | Status: DC
  Administered 2023-05-30 – 2023-06-01 (×2): 17 g
  Filled 2023-05-30 (×2): qty 1

## 2023-05-30 MED ORDER — FENTANYL CITRATE PF 50 MCG/ML IJ SOSY: 25.0000 ug | PREFILLED_SYRINGE | INTRAMUSCULAR | Status: DC | PRN

## 2023-05-30 MED ORDER — SODIUM CHLORIDE 0.9% FLUSH: 3.0000 mL | INTRAVENOUS | Status: DC | PRN

## 2023-05-30 MED ORDER — SODIUM CHLORIDE 0.9 % IV SOLN
250.0000 mL | INTRAVENOUS | Status: AC | PRN
  Administered 2023-05-30: 250 mL via INTRAVENOUS

## 2023-05-30 MED ORDER — LORAZEPAM 2 MG/ML IJ SOLN
INTRAMUSCULAR | Status: AC
  Filled 2023-05-30: qty 1

## 2023-05-30 MED ORDER — LEVETIRACETAM IN NACL 500 MG/100ML IV SOLN: 500.0000 mg | Freq: Two times a day (BID) | INTRAVENOUS | Status: DC

## 2023-05-30 MED ORDER — VITAL HIGH PROTEIN PO LIQD: 1000.0000 mL | ORAL | Status: DC

## 2023-05-30 MED ORDER — ASPIRIN 81 MG PO CHEW
81.0000 mg | CHEWABLE_TABLET | Freq: Every day | ORAL | Status: DC
  Administered 2023-05-30 – 2023-06-05 (×7): 81 mg
  Filled 2023-05-30 (×7): qty 1

## 2023-05-30 MED ORDER — POTASSIUM CHLORIDE 20 MEQ PO PACK
60.0000 meq | PACK | Freq: Once | ORAL | Status: AC
  Administered 2023-05-30: 60 meq
  Filled 2023-05-30: qty 3

## 2023-05-30 MED ORDER — DEXTROSE 50 % IV SOLN
1.0000 | Freq: Once | INTRAVENOUS | Status: AC
  Administered 2023-05-30: 50 mL via INTRAVENOUS
  Filled 2023-05-30: qty 50

## 2023-05-30 MED ORDER — POLYETHYLENE GLYCOL 3350 17 G PO PACK: 17.0000 g | PACK | Freq: Every day | ORAL | Status: DC | PRN

## 2023-05-30 NOTE — ED Notes (Signed)
Norepi restarted

## 2023-05-30 NOTE — Consult Note (Addendum)
 PLEASE SEE ATTENDING NEUROLOGY CONSULT NOTE FROM SAME DATE FOR FULL FINDINGS AND RECOMMENDATIONS  Greg Leaks, MD [facility] [phone]  If 7pm- 7am, please page neurology on call as listed in [facility].   NEUROLOGY CONSULT NOTE   Date of service: May 30, 2023 Patient Name: Carolyn Parrish MRN:  [id number] DOB:  1940-06-14 Chief Complaint: Code Stroke Requesting Provider: Merdis Stalling, MD  History of Present Illness  Carolyn Parrish is a 83 y.o. 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 She takes 81 mg aspirin  daily.   Patient was reportedly acting unusually last night including taking off all her clothes, acting uncharacteristically. She reportedly fell sometime in the evening, possibly around 2200 on 05/29/2023.  This morning the patient was altered with rightward focused gaze (different than her baseline).   Per daughter she is bedbound at baseline. Was more agitated than normal Sunday when she saw her. Daughter was told she slid out of bed Tuesday night between 9 and 10pm and bruised her ankle. Hallucinating and agitated this morning per staff.    EMS was called. EMS witnessed seizures en route, patient given 5 mg versed en route. At arrival, patient remained disoriented and altered. A CBG prior to CT showed glucose of 17. D50 given prior to CT scan.  LKW: 05/29/2023, mid day Modified rankin score: 5-Severe disability-bedridden, incontinent, needs constant attention IV Thrombolysis:  No (stroke not suspected) EVT: No (stroke not suspected) ICH Score: 2  NIHSS components Score: Comment  1a Level of Conscious 0[] 1[x] 2[] 3[]     1b LOC Questions 0[] 1[] 2[x]      1c LOC Commands 0[] 1[] 2[x]      2 Best Gaze 0[] 1[x] 2[]      3 Visual 0[x] 1[] 2[] 3[]     4 Facial Palsy 0[x]  1[] 2[] 3[]     5a Motor Arm - left 0[] 1[] 2[] 3[] 4[x] UN[] Unable to assess due to patient condition.   5b Motor Arm - Right 0[] 1[] 2[] 3[] 4[x] UN[]   6a Motor Leg - Left 0[] 1[] 2[] 3[] 4[x] UN[]   6b Motor Leg - Right 0[] 1[] 2[] 3[] 4[x] UN[]   7 Limb Ataxia 0[] 1[] 2[] 3[] UN[x]    8 Sensory 0[] 1[] 2[x] UN[]     9 Best Language 0[] 1[] 2[] 3[x]     10 Dysarthria 0[] 1[] 2[] UN[x]     11 Extinct. and Inattention 0[] 1[] 2[x]      TOTAL: 37+ (unable to assess many measures)      ROS  Unable to ascertain due to patient condition  Past History   Past Medical History:  Diagnosis Date   Cancer (HCC)    BREAST CANCER   CHF (congestive heart failure) (HCC)    DM (diabetes mellitus), secondary, with peripheral vascular complications (HCC)    Hyperlipidemia    Hypertension    Schizoaffective disorder (HCC)    Stroke (HCC)     Past Surgical History:  Procedure Laterality Date   CHOLECYSTECTOMY     20 16   MASTECTOMY COMPLETE / SIMPLE Left 10/30/2017   SIMPLE MASTECTOMY WITH AXILLARY SENTINEL NODE BIOPSY Left 10/30/2017   Procedure: LEFT SIMPLE MASTECTOMY;  Surgeon: Sim Dryer, MD;  Location: [facility] OR;  Service: General;  Laterality: Left;    Family History: No family history on file.  Social History  reports that she has never smoked. She has never used smokeless tobacco. She reports that she does not currently use alcohol. She reports that she does not use drugs.  Allergies  Allergen Reactions   Atorvastatin     UNSPECIFIED REACTION     Medications   Current Facility-Administered Medications:    0.9 %  sodium chloride  infusion, 250 mL, Intravenous, PRN, Merdis Stalling, MD, Last Rate: 10 mL/hr at 05/30/23 0922, 250 mL at 05/30/23 9562   levETIRAcetam  (KEPPRA ) IVPB 1000 mg/100 mL premix, 1,000 mg, Intravenous, Once **FOLLOWED BY** [COMPLETED] levETIRAcetam  (KEPPRA ) IVPB 1000 mg/100 mL premix, 1,000 mg, Intravenous, Once, Eleni Griffin, MD, Last Rate: 400  mL/hr at 05/30/23 0900, 1,000 mg at 05/30/23 0900   LORazepam  (ATIVAN ) 2 MG/ML injection, , , ,    midazolam  (VERSED ) 100 mg/100 mL (1 mg/mL) premix infusion, 0.5-10 mg/hr, Intravenous, Continuous, Naasz, Vana Gelineau, MD   sodium chloride  flush (NS) 0.9 % injection 3 mL, 3 mL, Intravenous, Q12H, Naasz, Vana Gelineau, MD   sodium chloride  flush (NS) 0.9 % injection 3 mL, 3 mL, Intravenous, PRN, Merdis Stalling, MD  Current Outpatient Medications:    acetaminophen  (TYLENOL ) 500 MG tablet, Take 500 mg by mouth every 4 (four) hours as needed., Disp: , Rfl:    alum & mag hydroxide-simeth (MAALOX/MYLANTA) 200-200-20 MG/5ML suspension, Take 30 mLs by mouth every 4 (four) hours as needed for indigestion or heartburn., Disp: , Rfl:    Amino Acids-Protein Hydrolys (FEEDING SUPPLEMENT, PRO-STAT SUGAR FREE 64,) LIQD, Take 30 mLs by mouth 3 (three) times daily with meals., Disp: , Rfl:    anastrozole  (ARIMIDEX ) 1 MG tablet, Take 1 tablet (1 mg total) by mouth daily., Disp: 90 tablet, Rfl: 4   aspirin  EC 81 MG tablet, Take 81 mg by mouth daily., Disp: , Rfl:    bisacodyl  (DULCOLAX) 10 MG suppository, Place 10 mg rectally as needed for moderate constipation., Disp: , Rfl:    camphor-menthol (SARNA) lotion, Apply 1 application topically 2 (two) times daily as needed for itching., Disp: , Rfl:    Cholecalciferol (VITAMIN D3) 25 MCG (1000 UT) CAPS, Take 1 capsule by mouth daily., Disp: , Rfl:    DIVALPROEX  SODIUM ER PO, Take 125 mg by mouth daily. Give 4 capsules by mouth daily (500 mg), Disp: , Rfl:    docusate sodium  (COLACE) 100 MG capsule, Take 100 mg by mouth 2 (two) times daily. For constipation, Disp: , Rfl:    magnesium  hydroxide (MILK OF MAGNESIA) 400 MG/5ML suspension, Take 30 mLs by mouth daily as needed for mild constipation., Disp: , Rfl:    Nutritional Supplements (ENSURE ENLIVE PO), Take 237 mLs by mouth 4 (four) times daily., Disp: , Rfl:    OLANZapine  (ZYPREXA ) 5 MG tablet, Take 1 tablet (5 mg total) by  mouth at bedtime., Disp: 30 tablet, Rfl: 0   oxyCODONE  (OXY IR/ROXICODONE ) 5 MG immediate release tablet, Take 5 mg by mouth every 4 (four) hours as needed for pain., Disp: , Rfl:    polyethylene glycol (MIRALAX  / GLYCOLAX ) packet, Take 17 g by mouth daily., Disp: , Rfl:    sennosides-docusate sodium  (SENOKOT-S) 8.6-50 MG tablet, Take 1 tablet by mouth at bedtime., Disp: , Rfl:    Sodium Phosphates (RA SALINE ENEMA) 19-7 GM/118ML ENEM, Place 1 each rectally as needed (for constipation)., Disp: , Rfl:    vitamin B-12 (  CYANOCOBALAMIN ) 500 MCG tablet, Take 500 mcg by mouth daily., Disp: , Rfl:   Vitals   Vitals:   2023-06-03 0800 03-Jun-2023 0833  BP:  110/63  Weight: 64.4 kg   Height: 5\' 6"  (1.676 m)     Body mass index is 22.92 kg/m.  Physical Exam   Constitutional: Elderly woman with rightward gaze, thin, frail in appearance. cachectic Psych: Unable to assess. Eyes: No scleral injection.  HENT: No OP obstruction.  Head: Normocephalic.  Cardiovascular: Tachycardic. Regular rhythm. Respiratory: Varied. Pt initially had [place] O2, but was intubated by the time exam was completed. Skin: Skin was thin, but there was no bruising on her extremities or visible axial skeleton.  Neurologic Examination  Neurological Exam:  Mental status/Cognition: Waxing/waning LOC, primarily lethargic and non-responsive. Eyes Open, eye contact poor, psychomotor activity is non voluntary.   She has poor attention.   Speech/language: Nonverbal. Unable to assess further due to patient condition. Cranial nerves:   CN II Pinpoint pupils on arrival. she does not blink to threat bilaterally.   CN III,IV,VI Unable to assess due to patient condition.   CN V    CN VII    CN VIII    CN IX & X    CN XI    CN XII     Motor:  Muscle bulk: reduced Drift: Unable to assess due to patient condition. Strength: Unable to assess due to patient condition. Handgrip and push and pull: Unable to assess due to patient  condition.   Sensation:  Light touch Unable to assess due to patient condition.   Pin prick  Withdraws to pain in L more than R side.   Temperature     Vibration    Proprioception      Coordination/Complex Motor:  Unable to assess due to patient condition.  Labs/Imaging/Neurodiagnostic studies   CBC:  Recent Labs  Lab 06-03-2023 0832  HGB 12.9  HCT 38.0   Basic Metabolic Panel:  Lab Results  Component Value Date   NA 144 06/03/2023   K 3.7 Jun 03, 2023   CO2 23 (A) 01/03/2021   GLUCOSE 84 03-Jun-2023   BUN 12 06/03/23   CREATININE 0.50 Jun 03, 2023   CALCIUM  8.9 01/03/2021   GFRNONAA 88.49 11/01/2020   GFRAA >90 11/01/2020   Lipid Panel:  Lab Results  Component Value Date   LDLCALC 122 10/01/2019   HgbA1c:  Lab Results  Component Value Date   HGBA1C 5.6 10/01/2019   Urine Drug Screen:     Component Value Date/Time   LABOPIA NONE DETECTED 01/08/2018 0012   COCAINSCRNUR NONE DETECTED 01/08/2018 0012   LABBENZ NONE DETECTED 01/08/2018 0012   AMPHETMU NONE DETECTED 01/08/2018 0012   THCU NONE DETECTED 01/08/2018 0012   LABBARB NONE DETECTED 01/08/2018 0012    Alcohol Level No results found for: "ETH" INR  Lab Results  Component Value Date   INR 1.17 11/16/2017   APTT No results found for: "APTT" AED levels: No results found for: "PHENYTOIN", "ZONISAMIDE", "LAMOTRIGINE", "LEVETIRACETA"  CT Head without contrast(Personally reviewed):  IMPRESSION: 1. No CT evidence of acute intracranial abnormality. 2. ASPECTS is 10  MRI Brain(Personally reviewed): pending  Neurodiagnostics rEEG:  pending  ASSESSMENT   Ryleeann D Poinsett is a 83 y.o. 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 Patient was noted to be seizing en route with glucose of 94.   Upon arrival, the  patient's CBG was found to be 17. Seizures corrected after administration of  D50. Seizures suspected as result of hypoglycemia of uncertain etiology. Severe neurological symptoms may be present and lasting after an acute case of hypoglycemia such as this.  Pt's imaging on arrival included a CT head wo contrast, which showed no acute intracranial pathology. CVA was considered unlikely in the setting, though the patient's neuro deficits remain significant due to seizures. Patient was failing to protect her airway upon arrival to her ED room post CT, and was intubated.   Per family, the patient is DNR, however the patient would likely want to have intubation and other relevant medical interventions short of CPR/ cardiac resuscitation.  Pt will require EEG, MRI, and be carefully monitored for signs of worsening neuro status.  RECOMMENDATIONS  - Pt on propofol , recommend switch to versed  when appropriate - 2 g of Keppra  given emergently. - Add daily keppra  500 mg BID when patient is no longer sedated via Versed  - rEEG pending (requested MRI-compatible leads) - Will need MRI wo contrast when patient stabilizes - Urinalysis w/ reflex to microscopy - Urine culture - HgbA1c ______________________________________________________________________  Signed: Addie Addison, MD [facility] Health Physician, PGY-1 05/30/2023 9:55 AM

## 2023-05-30 NOTE — Code Documentation (Signed)
 Stroke Response Nurse Documentation Code Documentation  Carolyn Parrish is a 83 y.o. female arriving to [facility]  via [facility] on 05/30/2023 with past medical hx of schizoaffective disorder, hypertension, hyperlipidemia, stroke, CHF, cancer, and diabetes. On aspirin  81 mg daily. Code stroke was activated by EMS.   Patient from [facility] where she was LKW at last night when she fell around 10pm. This morning was altered with right gaze. EMS witnessed seizure activity. 5mg  versed  given and placed on a non rebreather. Patient is DNR.   Stroke team at the bedside on patient arrival. Labs drawn and patient cleared for CT by Dr. Drury Geralds who remained with patient throughout the process. Seizure activity noted. CBG 17. Patient to CT with team. D50 given. NIHSS 37, see documentation for details and code stroke times. Patient with decreased LOC, disoriented, not following commands, right gaze preference , bilateral hemianopia, bilateral arm weakness, bilateral leg weakness, bilateral decreased sensation, Global aphasia , dysarthria , and Sensory  neglect on exam. The following imaging was completed:  CT Head. Patient is not a candidate for IV Thrombolytic due to out of window. Patient is not a candidate for IR due to stroke not suspected. Repeat CBG 218. Gaze improved. Concerns for airway protection discussed with patient's daughter by Dr. Doretta Gant. Decision made to intubate patient.   Care Plan: q2 hour NIHSS and VS.   Bedside handoff with ED RN.    Felicitas Horse Stroke Response RN

## 2023-05-30 NOTE — ED Notes (Addendum)
 Patient is currently intubated and sedated, will move legs slightly independently.

## 2023-05-30 NOTE — ED Notes (Signed)
 CBG is 73

## 2023-05-30 NOTE — ED Triage Notes (Signed)
 Pt from SNF, had a fall last night , staff claimed no injury. Staff stated pt was altered when they woke up.

## 2023-05-30 NOTE — ED Notes (Signed)
 Norepi started at 4mcg, propofol  stopped per verbal order and switched to versed 

## 2023-05-30 NOTE — Progress Notes (Signed)
 PCCM Brief Note  CBG remains low, 73 on repeat.  1 amp D50 ordered. Add D51/2NS at 50/hr.   Rafael Bun, PA - [facility] For pager details, please see AMION or use Epic chat  After 1900, please call [facility] for cross coverage needs 05/30/2023, 1:26 PM

## 2023-05-30 NOTE — Consult Note (Signed)
 NEUROLOGY CONSULT NOTE   Date of service: May 30, 2023 Patient Name: Carolyn Parrish MRN:  [id number] DOB:  September 09, 1940 Chief Complaint: stroke code Requesting Provider: Arlina Lair, MD  History of Present Illness   Carolyn Parrish is a 83 y.o. female with hx of with hx of advanced dementia, physical debility secondary to CVA, breast cancer, CAD, HTN, HLD, CHF, DM2, mood disorder, schizophrenia presents to the ED as a code stroke from her nursing home [facility]). She takes 81 mg aspirin  daily.   Last known well was 2100 last night.  She was noted to have had a fall sometime around 2200 last night.  This morning she was noted by facility staff to be profoundly altered with right forced gaze deviation.  EMS was dispatched and they called a code stroke and route to the ED.  They witnessed seizures and route and she was given 5 mg of Versed .  On arrival patient was having intermittent active seizures with right gaze deviation and bilateral upper extremity shaking, not responsive to commands or noxious stimuli. A CBG prior to CT showed glucose of 17. D50 given prior to CT scan.   Personal review of CT head showed no acute intracranial process. TNK was not administered due to presentation outside the window and presentation favored to be due to active seizures in the setting of hypoglycemia.  Seizures resolved after administration of D50 and 2g IV keppra .    She initially presented with DNAR/DNI paperwork however when daughter arrived at hospital she stated that the family's wishes were to intubate her if needed for seizure but not to defibrillate or administer chest compressions if patient went into cardiac arrest.  Patient was significantly altered due to postictal state and intubation was performed for airway protection.  She was initially sedated with propofol  however this was changed to Versed  after she developed hypotension requiring Levophed .  LKW: Last night ~2100 Modified rankin score:  5-Severe disability-bedridden, incontinent, needs constant attention IV Thrombolysis: No outside, outside window and sx favored to be 2/2 seizure  NIHSS components Score: Comment  1a Level of Conscious 0[]  1[]  2[x]  3[]      1b LOC Questions 0[]  1[]  2[x]       1c LOC Commands 0[]  1[]  2[x]       2 Best Gaze 0[]  1[]  2[x]       3 Visual 0[]  1[]  2[]  3[x]      4 Facial Palsy 0[x]  1[]  2[]  3[]      5a Motor Arm - left 0[]  1[]  2[]  3[]  4[x]  UN[]    5b Motor Arm - Right 0[]  1[]  2[]  3[]  4[x]  UN[]    6a Motor Leg - Left 0[]  1[]  2[]  3[]  4[x]  UN[]    6b Motor Leg - Right 0[]  1[]  2[]  3[]  4[x]  UN[]    7 Limb Ataxia 0[x]  1[]  2[]  3[]  UN[]     8 Sensory 0[]  1[]  2[x]  UN[]      9 Best Language 0[]  1[]  2[]  3[x]      10 Dysarthria 0[]  1[]  2[x]  UN[]      11 Extinct. and Inattention 0[]  1[]  2[x]       TOTAL:  37   Any unchecked boxes represent a score of 0  Exam  Vitals:   05/30/23 2035 05/30/23 2045  BP: (!) 94/51 (!) 96/50  Pulse: 78 77  Resp: 16 18  Temp:    SpO2: 100% 100%     ROS   Unable to ascertain due to AMS  Past History   Past Medical History:  Diagnosis Date  Cancer (HCC)    BREAST CANCER   CHF (congestive heart failure) (HCC)    DM (diabetes mellitus), secondary, with peripheral vascular complications (HCC)    Hyperlipidemia    Hypertension    Schizoaffective disorder (HCC)    Stroke [facility])     Past Surgical History:  Procedure Laterality Date   CHOLECYSTECTOMY     2016   MASTECTOMY COMPLETE / SIMPLE Left 10/30/2017   SIMPLE MASTECTOMY WITH AXILLARY SENTINEL NODE BIOPSY Left 10/30/2017   Procedure: LEFT SIMPLE MASTECTOMY;  Surgeon: Sim Dryer, MD;  Location: [facility] OR;  Service: General;  Laterality: Left;    Family History: History reviewed. No pertinent family history.  Social History  reports that she has never smoked. She has never used smokeless tobacco. She reports that she does not currently use alcohol. She reports that she does not use drugs.  Allergies  Allergen  Reactions   Atorvastatin     UNSPECIFIED REACTION     Medications   Current Facility-Administered Medications:    0.9 %  sodium chloride  infusion, 250 mL, Intravenous, PRN, Merdis Stalling, MD, Last Rate: 10 mL/hr at 05/30/23 0922, 250 mL at 05/30/23 0922   0.9 %  sodium chloride  infusion, 250 mL, Intravenous, Continuous, Desai, Rahul P, PA-C, Last Rate: 20 mL/hr at 05/30/23 1403, 250 mL at 05/30/23 1403   acetaminophen  (TYLENOL ) tablet 650 mg, 650 mg, Oral, Q4H PRN, Bowser, Grace E, NP   aspirin  chewable tablet 81 mg, 81 mg, Per Tube, Daily, Desai, Rahul P, PA-C, 81 mg at 05/30/23 1401   dextrose  5 % and 0.45 % NaCl infusion, , Intravenous, Continuous, Desai, Rahul P, PA-C, Last Rate: 50 mL/hr at 05/30/23 1446, New Bag at 05/30/23 1446   dextrose  5% in lactated ringers  with KCl 20 mEq/L infusion, , Intravenous, Continuous, Agarwala, Ravi, MD, Last Rate: 40 mL/hr at 05/30/23 1533, New Bag at 05/30/23 1533   docusate (COLACE) 50 MG/5ML liquid 100 mg, 100 mg, Per Tube, BID, Desai, Rahul P, PA-C   docusate sodium  (COLACE) capsule 100 mg, 100 mg, Oral, BID PRN, Bowser, Grace E, NP   feeding supplement (PROSource TF20) liquid 60 mL, 60 mL, Per Tube, Daily, Agarwala, Ravi, MD   feeding supplement (VITAL HIGH PROTEIN) liquid 1,000 mL, 1,000 mL, Per Tube, Q24H, Agarwala, Ravi, MD   fentaNYL  (SUBLIMAZE ) injection 25 mcg, 25 mcg, Intravenous, Q15 min PRN, Desai, Rahul P, PA-C   fentaNYL  (SUBLIMAZE ) injection 25-100 mcg, 25-100 mcg, Intravenous, Q30 min PRN, Desai, Rahul P, PA-C   heparin  injection 5,000 Units, 5,000 Units, Subcutaneous, Q8H, Bowser, Grace E, NP   midazolam  (VERSED ) 100 mg/100 mL (1 mg/mL) premix infusion, 0.5-10 mg/hr, Intravenous, Continuous, Merdis Stalling, MD, Last Rate: 0.5 mL/hr at 05/30/23 0942, 0.5 mg/hr at 05/30/23 7829   norepinephrine  (LEVOPHED ) 4mg  in (0.016 mg/mL) premix infusion, 2-10 mcg/min, Intravenous, Titrated, Desai, Rahul P, PA-C, Last Rate: 30 mL/hr at  05/30/23 1952, 8 mcg/min at 05/30/23 1952   ondansetron  (ZOFRAN ) injection 4 mg, 4 mg, Intravenous, Q6H PRN, Bowser, Darin Edinger, NP   pantoprazole  (PROTONIX ) injection 40 mg, 40 mg, Intravenous, QHS, Bowser, Grace E, NP   polyethylene glycol (MIRALAX  / GLYCOLAX ) packet 17 g, 17 g, Oral, Daily PRN, Bowser, Grace E, NP   polyethylene glycol (MIRALAX  / GLYCOLAX ) packet 17 g, 17 g, Per Tube, Daily, Desai, Rahul P, PA-C, 17 g at 05/30/23 1401   sodium chloride  flush (NS) 0.9 % injection 3 mL, 3 mL, Intravenous, Q12H, Merdis Stalling, MD,  3 mL at 05/30/23 1030   sodium chloride  flush (NS) 0.9 % injection 3 mL, 3 mL, Intravenous, PRN, Merdis Stalling, MD  Current Outpatient Medications:    acetaminophen  (TYLENOL ) 500 MG tablet, Take 500 mg by mouth every 4 (four) hours as needed., Disp: , Rfl:    alum & mag hydroxide-simeth (MAALOX/MYLANTA) 200-200-20 MG/5ML suspension, Take 30 mLs by mouth every 4 (four) hours as needed for indigestion or heartburn., Disp: , Rfl:    anastrozole  (ARIMIDEX ) 1 MG tablet, Take 1 tablet (1 mg total) by mouth daily., Disp: 90 tablet, Rfl: 4   famotidine  (PEPCID ) 20 MG tablet, Take 20 mg by mouth daily., Disp: , Rfl:    Nutritional Supplements (ENSURE ENLIVE PO), Take 237 mLs by mouth 4 (four) times daily., Disp: , Rfl:    polyethylene glycol (MIRALAX  / GLYCOLAX ) packet, Take 17 g by mouth daily., Disp: , Rfl:    promethazine (PHENERGAN) 12.5 MG tablet, Take 12.5 mg by mouth every 6 (six) hours as needed for nausea or vomiting., Disp: , Rfl:    sennosides-docusate sodium  (SENOKOT-S) 8.6-50 MG tablet, Take 1 tablet by mouth at bedtime., Disp: , Rfl:    Amino Acids-Protein Hydrolys (FEEDING SUPPLEMENT, PRO-STAT SUGAR FREE 64,) LIQD, Take 30 mLs by mouth 3 (three) times daily with meals., Disp: , Rfl:    aspirin  EC 81 MG tablet, Take 81 mg by mouth daily. (Patient not taking: Reported on 05/30/2023), Disp: , Rfl:    bisacodyl  (DULCOLAX) 10 MG suppository, Place 10 mg rectally as  needed for moderate constipation. (Patient not taking: Reported on 05/30/2023), Disp: , Rfl:    camphor-menthol (SARNA) lotion, Apply 1 application topically 2 (two) times daily as needed for itching. (Patient not taking: Reported on 05/30/2023), Disp: , Rfl:    Cholecalciferol (VITAMIN D3) 25 MCG (1000 UT) CAPS, Take 1 capsule by mouth daily. (Patient not taking: Reported on 05/30/2023), Disp: , Rfl:    DIVALPROEX  SODIUM ER PO, Take 125 mg by mouth daily. Give 4 capsules by mouth daily (500 mg) (Patient not taking: Reported on 05/30/2023), Disp: , Rfl:    docusate sodium  (COLACE) 100 MG capsule, Take 100 mg by mouth 2 (two) times daily. For constipation (Patient not taking: Reported on 05/30/2023), Disp: , Rfl:    magnesium  hydroxide (MILK OF MAGNESIA) 400 MG/5ML suspension, Take 30 mLs by mouth daily as needed for mild constipation. (Patient not taking: Reported on 05/30/2023), Disp: , Rfl:    OLANZapine  (ZYPREXA ) 5 MG tablet, Take 1 tablet (5 mg total) by mouth at bedtime. (Patient not taking: Reported on 05/30/2023), Disp: 30 tablet, Rfl: 0   oxyCODONE  (OXY IR/ROXICODONE ) 5 MG immediate release tablet, Take 5 mg by mouth every 4 (four) hours as needed for pain. (Patient not taking: Reported on 05/30/2023), Disp: , Rfl:    Sodium Phosphates (RA SALINE ENEMA) 19-7 GM/118ML ENEM, Place 1 each rectally as needed (for constipation). (Patient not taking: Reported on 05/30/2023), Disp: , Rfl:    vitamin B-12 (CYANOCOBALAMIN ) 500 MCG tablet, Take 500 mcg by mouth daily. (Patient not taking: Reported on 05/30/2023), Disp: , Rfl:   Vitals   Vitals:   05/30/23 2025 05/30/23 2030 05/30/23 2035 05/30/23 2045  BP: (!) 91/53 (!) 94/49 (!) 94/51 (!) 96/50  Pulse: 80 80 78 77  Resp: 18 18 16 18   Temp:      TempSrc:      SpO2: 100% 100% 100% 100%  Weight:      Height:  Body mass index is 22.92 kg/m.  Physical Exam   Gen: patient lying on stretcher actively seizing CV: extremities appear  well-perfused Resp: increased WOB on NRB  Neurologic Examination   MS: no verbal or motor response to noxious stimuli, does not follow commands Speech: nonverbal CN: PERRL, no blink to threat bilat, R gaze deviation, oral automatisms Motor & sensory: intermittent BUE rhythmic jerking but no movement in any extremity to pain Coordination: UTA Gait: UTA  Labs/Imaging/Neurodiagnostic studies   CBC:  Recent Labs  Lab 28-Jun-2023 0945 06-28-23 1008 28-Jun-2023 1204  WBC 13.0*  --   --   NEUTROABS 9.0*  --   --   HGB 10.7* 11.6* 10.9*  HCT 37.2 34.0* 32.0*  MCV 92.8  --   --   PLT 287  --   --    Basic Metabolic Panel:  Lab Results  Component Value Date   NA 146 (H) 06-28-2023   K 3.0 (L) 28-Jun-2023   CO2 21 (L) 2023/06/28   GLUCOSE 122 (H) 06/28/2023   BUN 10 Jun 28, 2023   CREATININE 0.64 06-28-23   CALCIUM  8.8 (L) 06/28/23   GFRNONAA >60 06-28-23   GFRAA >90 11/01/2020   Lipid Panel:  Lab Results  Component Value Date   LDLCALC 122 10/01/2019   HgbA1c:  Lab Results  Component Value Date   HGBA1C 5.1 2023/06/28   Urine Drug Screen:     Component Value Date/Time   LABOPIA NONE DETECTED 28-Jun-2023 0945   COCAINSCRNUR NONE DETECTED Jun 28, 2023 0945   LABBENZ POSITIVE (A) 2023/06/28 0945   AMPHETMU NONE DETECTED Jun 28, 2023 0945   THCU NONE DETECTED 28-Jun-2023 0945   LABBARB NONE DETECTED 06/28/23 0945    Alcohol Level     Component Value Date/Time   ETH <15 06-28-23 0945   INR  Lab Results  Component Value Date   INR 1.17 11/16/2017   APTT No results found for: "APTT" AED levels: No results found for: "PHENYTOIN", "ZONISAMIDE", "LAMOTRIGINE", "LEVETIRACETA"  CT Head without contrast(Personally reviewed): 1. No CT evidence of acute intracranial abnormality. 2. ASPECTS is 10  ASSESSMENT   Kimyetta D Schumpert is a 83 y.o. female with hx of with hx of advanced dementia, physical debility secondary to CVA, breast cancer, CAD, HTN, HLD, CHF, DM2, mood  disorder, schizophrenia presented with status epilepticus in setting of severe hypoglycemia  RECOMMENDATIONS   - Patient intubated for airway protection and admitted to CCM - Sedated on versed  - LTM EEG - Seizures favored to be provoked in setting of severe hypoglycemia, will not continue keppra  unless LTM EEG shows further seizure activity - MRI brain wwo contrast for further evaluation given first lifetime seizure and concern for possible neurologic sequelae of severe hyperglycemia (EEG leads are MRI-compatible) - Will continue to follow.   This patient is critically ill and at significant risk of neurological worsening, death and Parrish requires constant monitoring of vital signs, hemodynamics,respiratory and cardiac monitoring, neurological assessment, discussion with family, other specialists and medical decision making of high complexity. I spent 90 minutes of neurocritical Parrish time  in the Parrish of  this patient. This was time spent independent of any time provided by nurse practitioner or PA.  Greg Leaks, MD [facility] [phone]  If 7pm- 7am, please page neurology on call as listed in AMION.

## 2023-05-30 NOTE — ED Notes (Signed)
 IV site with levophed  running through it is patent with no signs of infiltration.

## 2023-05-30 NOTE — ED Notes (Signed)
 Patient's IV with levophed  running is currently patent and without signs of infiltration.

## 2023-05-30 NOTE — H&P (Addendum)
 NAME:  Carolyn Parrish, MRN:  [id number], DOB:  [birth year], LOS: 0 ADMISSION DATE:  05/30/2023, CONSULTATION DATE:  05/30/23 REFERRING MD:  Drury Geralds CHIEF COMPLAINT:  AMS   History of Present Illness:  Pt is encephelopathic; therefore, this HPI is obtained from chart review.Carolyn Parrish is a 83 y.o. female who has a PMH as below including but not limited to prior CVA with residual weakness and deficits, dementia, functional decline, breast CA s/p XRT and mastectomy 2020. She resides at [facility]. She was in her usual state of health 4/22 but that night suffered a fall followed by some "behavioral changes" that were not normal.  On morning of 4/23, she remained altered and had a right gaze deviation prompting EMS to be called.  She was brought to [facility] ED as code stroke; however, seizures noted on arrival and CBG was 17. She was given an amp of D50 with resolution of seizures. CT head was negative. She remained altered and possibly post ictal with concern for airway protection. DNR form noted; however, per discussions with daughter, this was primarily in regards to chest compressions/defib and she was ok with intubation; therefore, she was intubated for airway protection.  Neurology was consulted and suspected AMS was secondary to seizures in setting of profound hypoglycemia.  Pertinent  Medical History:  has Malignant neoplasm of upper-inner quadrant of left breast in female, estrogen receptor positive (HCC); Encounter for central line placement; History of stroke; DM (diabetes mellitus), type 2 with peripheral vascular complications (HCC); Schizoaffective disorder (HCC); Breast cancer, stage 2, left (HCC); Sepsis (HCC); Septic shock (HCC); AKI (acute kidney injury) (HCC); Lactic acidosis; Bipolar disorder (HCC); History of completed stroke; Dehydration; Acute encephalopathy; Acute hypernatremia; Vascular dementia (HCC); Essential hypertension; Hypertensive heart disease with heart failure (HCC);  Dyslipidemia; Normochromic normocytic anemia; Vitamin D  deficiency; Adult failure to thrive; and Seizure (HCC) on their problem list.   Significant Hospital Events: Including procedures, antibiotic start and stop dates in addition to other pertinent events   4/23 admit.  Interim History / Subjective:  Sedated on Midazolam . Being hooked up to EEG.  Objective:  Blood pressure 116/67, pulse 71, temperature 98.7 F (37.1 C), temperature source Oral, resp. rate (!) 21, height 5\' 6"  (1.676 m), weight 64.4 kg, SpO2 100%.    Vent Mode: PRVC FiO2 (%):  [100 %] 100 % Set Rate:  [18 bmp] 18 bmp Vt Set:  [470 mL] 470 mL PEEP:  [5 cmH20] 5 cmH20 Plateau Pressure:  [27 cmH20] 27 cmH20   Intake/Output Summary (Last 24 hours) at 05/30/2023 1118 Last data filed at 05/30/2023 1050 Gross per 24 hour  Intake 104.77 ml  Output --  Net 104.77 ml   Filed Weights   05/30/23 0800  Weight: 64.4 kg    Examination: General: Elderly female, in NAD. Neuro: Sedated, not responsive. HEENT: /AT. Sclerae anicteric. ETT in place. Cardiovascular: RRR, no M/R/G.  Lungs: Respirations even and unlabored.  CTA bilaterally, No W/R/R. Abdomen: BS x 4, soft, NT/ND.  Musculoskeletal: Muscle wasting. Small right knee abrasion that is superficial, no edema.  Skin: Intact, warm, no rashes.  Labs/imaging personally reviewed:  CT head 4/23 > neg. MRI brain 4/23 >  EEG 4/23 >   Assessment & Plan:   Seizures - suspected 2/2 hypoglycemia, resolved after D50. - Neuro on board, appreciate the assistance. - EEG per neuro. - AED's per neuro. - CBG's q4hrs for now. - MRI brain when able.  Acute respiratory insufficiency with inability to protect  the airway - 2/2 above. Now s/p intubation. - Full vent support. - Wean as mental status allows. - Bronchial hygiene. - CXR intermittently.  Hypotension - presumed sedation related, not felt to be 2/2 sepsis. - Continue low dose NE as needed for goal MAP > 60. - Wean  sedation as able.  Hypokalemia. Hypocalcemia - being repleted already. - 60 mEq K now. - Follow BMP, repeat this PM and in AM.  Hx CVA, schizoaffective disorder. - Continue PTA ASA. - Hold PTA Olanzapine , Oxycodone .  Hx HTN, HLD, CHF per report (no echo available). - Continue PTA ASA.  Goals of care. - DNR form signed and at bedside; however, upon discussion with daughter, this is only applicable for chest compressions and defibrillations. Pt and family ok with short term intubation. Clarified DNR order and confirmed with daughter that we will continue mechanical ventilation as she is already intubated but will place DNR orders in the chart in the event of any arrest.  Best practice (evaluated daily):  Diet/type: NPO DVT prophylaxis: prophylactic heparin   Pressure ulcer(s): pressure ulcer assessment deferred  GI prophylaxis: PPI Lines: N/A Foley:  Yes, and it is still needed Code Status:  DNR Last date of multidisciplinary goals of care discussion: None yet.  Labs   CBC: Recent Labs  Lab 05/30/23 0832 05/30/23 0945 05/30/23 1008  WBC  --  13.0*  --   NEUTROABS  --  9.0*  --   HGB 12.9 10.7* 11.6*  HCT 38.0 37.2 34.0*  MCV  --  92.8  --   PLT  --  287  --     Basic Metabolic Panel: Recent Labs  Lab 05/30/23 0832 05/30/23 0945 05/30/23 1008  NA 144 146* 144  K 3.7 3.1* 2.9*  CL 115* 113*  --   CO2  --  21*  --   GLUCOSE 84 122*  --   BUN 12 10  --   CREATININE 0.50 0.64  --   CALCIUM   --  8.8*  --    GFR: Estimated Creatinine Clearance: 52.5 mL/min (by C-G formula based on SCr of 0.64 mg/dL). Recent Labs  Lab 05/30/23 0945  WBC 13.0*    Liver Function Tests: Recent Labs  Lab 05/30/23 0945  AST 39  ALT 34  ALKPHOS 115  BILITOT 1.0  PROT 6.8  ALBUMIN  2.7*   No results for input(s): "LIPASE", "AMYLASE" in the last 168 hours. No results for input(s): "AMMONIA" in the last 168 hours.  ABG    Component Value Date/Time   HCO3 19.1 (L)  05/30/2023 1008   TCO2 20 (L) 05/30/2023 1008   ACIDBASEDEF 3.0 (H) 05/30/2023 1008   O2SAT 100 05/30/2023 1008     Coagulation Profile: No results for input(s): "INR", "PROTIME" in the last 168 hours.  Cardiac Enzymes: No results for input(s): "CKTOTAL", "CKMB", "CKMBINDEX", "TROPONINI" in the last 168 hours.  HbA1C: Hemoglobin A1C  Date/Time Value Ref Range Status  10/01/2019 12:00 AM 5.6  Final  02/19/2019 12:00 AM 5.6  Final    CBG: Recent Labs  Lab 05/30/23 0828 05/30/23 0840  GLUCAP 17* 218*    Review of Systems:   Unable to obtain as pt is encephalopathic.  Past Medical History:  She,  has a past medical history of Cancer (HCC), CHF (congestive heart failure) (HCC), DM (diabetes mellitus), secondary, with peripheral vascular complications (HCC), Hyperlipidemia, Hypertension, Schizoaffective disorder (HCC), and Stroke (HCC).   Surgical History:   Past Surgical History:  Procedure Laterality  Date   CHOLECYSTECTOMY     2016   MASTECTOMY COMPLETE / SIMPLE Left 10/30/2017   SIMPLE MASTECTOMY WITH AXILLARY SENTINEL NODE BIOPSY Left 10/30/2017   Procedure: LEFT SIMPLE MASTECTOMY;  Surgeon: Sim Dryer, MD;  Location: [facility] OR;  Service: General;  Laterality: Left;     Social History:   reports that she has never smoked. She has never used smokeless tobacco. She reports that she does not currently use alcohol. She reports that she does not use drugs.   Family History:  Her family history is not on file.   Allergies Allergies  Allergen Reactions   Atorvastatin     UNSPECIFIED REACTION      Home Medications  Prior to Admission medications   Medication Sig Start Date End Date Taking? Authorizing Provider  acetaminophen  (TYLENOL ) 500 MG tablet Take 500 mg by mouth every 4 (four) hours as needed.   Yes [provider]  alum & mag hydroxide-simeth (MAALOX/MYLANTA) 200-200-20 MG/5ML suspension Take 30 mLs by mouth every 4 (four) hours as needed for  indigestion or heartburn.   Yes [provider]  anastrozole  (ARIMIDEX ) 1 MG tablet Take 1 tablet (1 mg total) by mouth daily. 11/23/17  Yes Magrinat, Rozella Cornfield, MD  famotidine  (PEPCID ) 20 MG tablet Take 20 mg by mouth daily. 04/18/23  Yes [provider]  Nutritional Supplements (ENSURE ENLIVE PO) Take 237 mLs by mouth 4 (four) times daily.   Yes [provider]  polyethylene glycol (MIRALAX  / GLYCOLAX ) packet Take 17 g by mouth daily.   Yes [provider]  promethazine (PHENERGAN) 12.5 MG tablet Take 12.5 mg by mouth every 6 (six) hours as needed for nausea or vomiting. 05/17/23  Yes [provider]  sennosides-docusate sodium  (SENOKOT-S) 8.6-50 MG tablet Take 1 tablet by mouth at bedtime.   Yes [provider]  Amino Acids-Protein Hydrolys (FEEDING SUPPLEMENT, PRO-STAT SUGAR FREE 64,) LIQD Take 30 mLs by mouth 3 (three) times daily with meals.    [provider]  aspirin  EC 81 MG tablet Take 81 mg by mouth daily. Patient not taking: Reported on 05/30/2023    [provider]  bisacodyl  (DULCOLAX) 10 MG suppository Place 10 mg rectally as needed for moderate constipation. Patient not taking: Reported on 05/30/2023    [provider]  camphor-menthol [facility]) lotion Apply 1 application topically 2 (two) times daily as needed for itching. Patient not taking: Reported on 05/30/2023    [provider]  Cholecalciferol (VITAMIN D3) 25 MCG (1000 UT) CAPS Take 1 capsule by mouth daily. Patient not taking: Reported on 05/30/2023    [provider]  DIVALPROEX  SODIUM ER PO Take 125 mg by mouth daily. Give 4 capsules by mouth daily (500 mg) Patient not taking: Reported on 05/30/2023 03/05/18   [provider]  docusate sodium  (COLACE) 100 MG capsule Take 100 mg by mouth 2 (two) times daily. For constipation Patient not taking: Reported on 05/30/2023    [provider]  magnesium  hydroxide (MILK OF  MAGNESIA) 400 MG/5ML suspension Take 30 mLs by mouth daily as needed for mild constipation. Patient not taking: Reported on 05/30/2023    [provider]  OLANZapine  (ZYPREXA ) 5 MG tablet Take 1 tablet (5 mg total) by mouth at bedtime. Patient not taking: Reported on 05/30/2023 01/12/18   Kraig Peru, MD  oxyCODONE  (OXY IR/ROXICODONE ) 5 MG immediate release tablet Take 5 mg by mouth every 4 (four) hours as needed for pain. Patient not taking:  Reported on 05/30/2023    [provider]  Sodium Phosphates (RA SALINE ENEMA) 19-7 GM/118ML ENEM Place 1 each rectally as needed (for constipation). Patient not taking: Reported on 05/30/2023    [provider]  vitamin B-12 (CYANOCOBALAMIN ) 500 MCG tablet Take 500 mcg by mouth daily. Patient not taking: Reported on 05/30/2023    [provider]     Critical care time: 40 min.   Rafael Bun, PA - [facility] For pager details, please see AMION or use Epic chat  After 1900, please call [facility] for cross coverage needs 05/30/2023, 11:18 AM

## 2023-05-30 NOTE — ED Provider Notes (Signed)
 [place] EMERGENCY DEPARTMENT AT [facility] Provider Note   CSN: [id number] Arrival date & time: 05/30/23  4098  An emergency department physician performed an initial assessment on this suspected stroke patient at 0826.  History  Chief Complaint  Patient presents with   Altered Mental Status    Carolyn Parrish is a 83 y.o. female with PMH as listed below who presents by EMS from [place] as a code stroke. LKN was yesterday after a fall at approximately 2200, began to have movements concerning for seizure-like activity with forced right gaze deviation.  EMS gave 5 mg IM versed . Patient had PO glucose of 94 mg/dL with EMS but on arrival was found to be 17 mg/dL, and EMS states their glucometer is broken. Patient is seizing on arrival with R facial droop and tonic clonic movements.  Last known normal was 2100 last night.  Patient presented with DNR paperwork.  Daughter arrived at hospital and informed us  that patient would not want compressions but would want everything up to then including intubation or ICU admission.   Past Medical History:  Diagnosis Date   Cancer (HCC)    BREAST CANCER   CHF (congestive heart failure) (HCC)    DM (diabetes mellitus), secondary, with peripheral vascular complications (HCC)    Hyperlipidemia    Hypertension    Schizoaffective disorder (HCC)    Stroke (HCC)        Home Medications Prior to Admission medications   Medication Sig Start Date End Date Taking? Authorizing Provider  acetaminophen  (TYLENOL ) 500 MG tablet Take 500 mg by mouth every 4 (four) hours as needed.    [provider]  alum & mag hydroxide-simeth (MAALOX/MYLANTA) 200-200-20 MG/5ML suspension Take 30 mLs by mouth every 4 (four) hours as needed for indigestion or heartburn.    [provider]  Amino Acids-Protein Hydrolys (FEEDING SUPPLEMENT, PRO-STAT SUGAR FREE 64,) LIQD Take 30 mLs by mouth 3 (three) times daily with meals.    [provider]  anastrozole  (ARIMIDEX ) 1 MG tablet Take 1 tablet (1 mg total) by mouth daily. 11/23/17   Magrinat, Rozella Cornfield, MD  aspirin  EC 81 MG tablet Take 81 mg by mouth daily.    [provider]  bisacodyl  (DULCOLAX) 10 MG suppository Place 10 mg rectally as needed for moderate constipation.    [provider]  camphor-menthol Eyvonne Hollering) lotion Apply 1 application topically 2 (two) times daily as needed for itching.    [provider]  Cholecalciferol (VITAMIN D3) 25 MCG (1000 UT) CAPS Take 1 capsule by mouth daily.    [provider]  DIVALPROEX  SODIUM ER PO Take 125 mg by mouth daily. Give 4 capsules by mouth daily (500 mg) 03/05/18   [provider]  docusate sodium  (COLACE) 100 MG capsule Take 100 mg by mouth 2 (two) times daily. For constipation    [provider]  magnesium  hydroxide (MILK OF MAGNESIA) 400 MG/5ML suspension Take 30 mLs by mouth daily as needed for mild constipation.    [provider]  Nutritional Supplements (ENSURE ENLIVE PO) Take 237 mLs by mouth 4 (four) times daily.    [provider]  OLANZapine  (ZYPREXA ) 5 MG tablet Take 1 tablet (5 mg total) by mouth at bedtime. 01/12/18   Kraig Peru, MD  oxyCODONE  (OXY IR/ROXICODONE ) 5 MG immediate release tablet Take 5 mg by mouth every 4 (four) hours as needed for pain.    [provider]  polyethylene glycol (MIRALAX  /  GLYCOLAX ) packet Take 17 g by mouth daily.    [provider]  sennosides-docusate sodium  (SENOKOT-S) 8.6-50 MG tablet Take 1 tablet by mouth at bedtime.    [provider]  Sodium Phosphates  (RA SALINE ENEMA) 19-7 GM/118ML ENEM Place 1 each rectally as needed (for constipation).    [provider]  vitamin B-12 (CYANOCOBALAMIN ) 500 MCG tablet Take 500 mcg by mouth daily.    [provider]      Allergies    Atorvastatin    Review of Systems   Review of Systems A 10 point review of systems  was performed and is negative unless otherwise reported in HPI.  Physical Exam Updated Vital Signs BP 110/63   Ht 5\' 6"  (1.676 m)   Wt 64.4 kg   BMI 22.92 kg/m  Physical Exam General: Acutely ill appearing elderly female, lying in bed.  HEENT: PERRLA, Sclera anicteric, MMM, trachea midline.  Cardiology: RRR, no murmurs/rubs/gallops.  Resp: Mildly increased work of breathing with coarse breath sounds bilaterally and mild tachypnea Abd: Soft, non-tender, non-distended. No rebound tenderness or guarding.  GU: Deferred. MSK: No peripheral edema or signs of trauma. Extremities without deformity or TTP. No cyanosis or clubbing. Skin: warm, dry.  Neuro: Unresponsive, does not respond to noxious stimuli, right gaze deviation, oral automatisms, midrange pupils.  Intermittent bilateral upper extremity rhythmic jerking movements but no response to pain.    ED Results / Procedures / Treatments   Labs (all labs ordered are listed, but only abnormal results are displayed) Labs Reviewed  CBC - Abnormal; Notable for the following components:   WBC 13.0 (*)    Hemoglobin 10.7 (*)    MCHC 28.8 (*)    RDW 19.0 (*)    All other components within normal limits  DIFFERENTIAL - Abnormal; Notable for the following components:   Neutro Abs 9.0 (*)    Monocytes Absolute 1.2 (*)    Abs Immature Granulocytes 0.11 (*)    All other components within normal limits  COMPREHENSIVE METABOLIC PANEL WITH GFR - Abnormal; Notable for the following components:   Sodium 146 (*)    Potassium 3.1 (*)    Chloride 113 (*)    CO2 21 (*)    Glucose, Bld 122 (*)    Calcium  8.8 (*)    Albumin  2.7 (*)    All other components within normal limits  RAPID URINE DRUG SCREEN, HOSP PERFORMED - Abnormal; Notable for the following components:   Benzodiazepines POSITIVE (*)    All other components within normal limits  PROTIME-INR - Abnormal; Notable for the following components:   Prothrombin Time 15.9 (*)    INR 1.3 (*)     All other components within normal limits  I-STAT CHEM 8, ED - Abnormal; Notable for the following components:   Chloride 115 (*)    Calcium , Ion 0.94 (*)    TCO2 20 (*)    All other components within normal limits  CBG MONITORING, ED - Abnormal; Notable for the following components:   Glucose-Capillary 17 (*)    All other components within normal limits  CBG MONITORING, ED - Abnormal; Notable for the following components:   Glucose-Capillary 218 (*)    All other components within normal limits  I-STAT VENOUS BLOOD GAS, ED - Abnormal; Notable for the following components:   pH, Ven 7.469 (*)    pCO2, Ven 26.3 (*)    pO2, Ven 204 (*)    Bicarbonate 19.1 (*)    TCO2  20 (*)    Acid-base deficit 3.0 (*)    Potassium 2.9 (*)    Calcium , Ion 0.87 (*)    HCT 34.0 (*)    Hemoglobin 11.6 (*)    All other components within normal limits  I-STAT ARTERIAL BLOOD GAS, ED - Abnormal; Notable for the following components:   pO2, Arterial 397 (*)    Sodium 146 (*)    Potassium 3.0 (*)    HCT 32.0 (*)    Hemoglobin 10.9 (*)    All other components within normal limits  CBG MONITORING, ED - Abnormal; Notable for the following components:   Glucose-Capillary 55 (*)    All other components within normal limits  CBG MONITORING, ED - Abnormal; Notable for the following components:   Glucose-Capillary 117 (*)    All other components within normal limits  CBG MONITORING, ED - Abnormal; Notable for the following components:   Glucose-Capillary 159 (*)    All other components within normal limits  CBG MONITORING, ED - Abnormal; Notable for the following components:   Glucose-Capillary 163 (*)    All other components within normal limits  RESP PANEL BY RT-PCR (RSV, FLU A&B, COVID)  RVPGX2  MRSA NEXT GEN BY PCR, NASAL  CULTURE, BLOOD (ROUTINE X 2)  CULTURE, BLOOD (ROUTINE X 2)  URINE CULTURE  ETHANOL  APTT  MAGNESIUM     EKG EKG Interpretation Date/Time:  Wednesday May 30 2023 10:48:43  EDT Ventricular Rate:  80 PR Interval:  125 QRS Duration:  88 QT Interval:  425 QTC Calculation: 491 R Axis:   23  Text Interpretation: Sinus rhythm Abnormal R-wave progression, early transition Nonspecific T abnormalities, anterior leads Borderline prolonged QT interval When compared with ECG of 01/07/2018, No significant change was found Confirmed by Alissa April ([id number]) on 05/31/2023 2:20:40 AM  Radiology CT HEAD CODE STROKE WO CONTRAST Result Date: 05/30/2023 CLINICAL DATA:  Code stroke. Neuro deficit, right-sided gaze, altered mental status. EXAM: CT HEAD WITHOUT CONTRAST TECHNIQUE: Contiguous axial images were obtained from the base of the skull through the vertex without intravenous contrast. RADIATION DOSE REDUCTION: This exam was performed according to the departmental dose-optimization program which includes automated exposure control, adjustment of the mA and/or kV according to patient size and/or use of iterative reconstruction technique. COMPARISON:  12/04/2021. FINDINGS: Brain: No acute intracranial hemorrhage. No CT evidence of acute infarct. Nonspecific hypoattenuation in the periventricular and subcortical white matter favored to reflect chronic microvascular ischemic changes. Mild parenchymal volume loss. No edema, mass effect, or midline shift. The basilar cisterns are patent. Ventricles: Prominence of the ventricles suggesting underlying parenchymal volume loss. Vascular: Atherosclerotic calcifications of the carotid siphons. No hyperdense vessel. Skull: No acute or aggressive finding. Orbits: Orbits are symmetric. Sinuses: Mild mucosal thickening in the ethmoid sinuses. Other: Mastoid air cells are clear. Partially visualized nasal airway. ASPECTS [facility] Stroke Program Early CT Score) - Ganglionic level infarction (caudate, lentiform nuclei, internal capsule, insula, M1-M3 cortex): 7 - Supraganglionic infarction (M4-M6 cortex): 3 Total score (0-10 with 10 being normal): 10  IMPRESSION: 1. No CT evidence of acute intracranial abnormality. 2. ASPECTS is 10 These results were communicated to Dr. Doretta Gant At 8:44 am on 05/30/2023 by text page via the [facility] messaging system. Electronically Signed   By: Denny Flack M.D.   On: 05/30/2023 08:44    Procedures .Critical Care  Performed by: Merdis Stalling, MD Authorized by: Merdis Stalling, MD   Critical care provider statement:    Critical care time (  minutes):  60   Critical care was necessary to treat or prevent imminent or life-threatening deterioration of the following conditions:  CNS failure or compromise and respiratory failure   Critical care was time spent personally by me on the following activities:  Development of treatment plan with patient or surrogate, discussions with consultants, evaluation of patient's response to treatment, examination of patient, ordering and review of laboratory studies, ordering and review of radiographic studies, ordering and performing treatments and interventions, pulse oximetry, re-evaluation of patient's condition, review of old charts, obtaining history from patient or surrogate, ventilator management and blood draw for specimens   Care discussed with: admitting provider   Procedure Name: Intubation Date/Time: 05/30/2023 8:59 AM  Performed by: Merdis Stalling, MDPre-anesthesia Checklist: Patient identified, Patient being monitored, Emergency Drugs available, Timeout performed and Suction available Oxygen Delivery Method: Ambu bag Preoxygenation: Pre-oxygenation with 100% oxygen Induction Type: Rapid sequence Ventilation: Mask ventilation without difficulty and Nasal airway inserted- appropriate to patient size Laryngoscope Size: 3 and Glidescope Grade View: Grade I Tube size: 7.5 mm Number of attempts: 1 Airway Equipment and Method: Video-laryngoscopy Placement Confirmation: ETT inserted through vocal cords under direct vision, CO2 detector and Breath sounds checked- equal and  bilateral Secured at: 23 cm Tube secured with: ETT holder Dental Injury: Teeth and Oropharynx as per pre-operative assessment  Difficulty Due To: Difficulty was unanticipated Comments: Highest O2 sat prior to intubation, 85%; desatted to 67% with intubation but returned to 93% with bagging via tube.        Medications Ordered in ED Medications  LORazepam  (ATIVAN ) 2 MG/ML injection (has no administration in time range)  levETIRAcetam  (KEPPRA ) IVPB 1000 mg/100 mL premix (has no administration in time range)    Followed by  levETIRAcetam  (KEPPRA ) IVPB 1000 mg/100 mL premix (has no administration in time range)  sodium chloride  flush (NS) 0.9 % injection 3 mL (has no administration in time range)  sodium chloride  flush (NS) 0.9 % injection 3 mL (has no administration in time range)  0.9 %  sodium chloride  infusion (has no administration in time range)  propofol  (DIPRIVAN ) 1000 MG/100ML infusion (has no administration in time range)  norepinephrine  (LEVOPHED ) 4-5 MG/250ML-% infusion SOLN (has no administration in time range)  dextrose  50 % solution (50 mLs  Given 05/30/23 0830)    ED Course/ Medical Decision Making/ A&P                          Medical Decision Making Amount and/or Complexity of Data Reviewed Labs: ordered. Decision-making details documented in ED Course. Radiology: ordered. Decision-making details documented in ED Course.  Risk Prescription drug management. Decision regarding hospitalization.    This patient presents to the ED for concern of seizure-like activity and respiratory distress, this involves an extensive number of treatment options, and is a complaint that carries with it a high risk of complications and morbidity.  I considered the following differential and admission for this acute, potentially life threatening condition.   MDM:    Patient presents seizing with R facial droop, seizure stops after a few minutes without further treatment. Neurology  evaluating patient on arrival. Patient has DNR paperwork. Patient was taken directly to CT scanner where [facility] ruled out ICH. It was at this time that her CBG was determined to be 17 mg/dL, which was corrected to >200 mg/dL with 1 amp D50. Patient still somnolent with tachypnea and significantly rhonchorous breath sounds. Satting 83% on NRB. Patient's  daughter presents to bedside in ED pod where she confirms DNR but states that her mother would want intubation under these circumstance. Pre-oxygenated with BVM 100% O2 and only satting 85%. Patient was emergently intubated with etomidate/succinylcholine , satting 93% with ETT and 100% afterward. Post-intubation sedation w/ propofol /fentanyl . Patient with BP 100-110 systolic prior to propofol , have ordered levophed  for BP support as needed. Neurology recommended keppra  load.    Ddx for patient's seizure includes: Consider likely hypoglycemia as a cause however she still is noted to have seizures even when her glucose is corrected.  CT brain shows no ICH.  Will need MRI brain with and without contrast for further evaluation.  She is on long-term EEG now.  She does have hypocalcemia with which will be repleted, could be contributing to her seizures but doubtful that this is the cause.  Low concern for alcohol/drug withdrawal or toxic ingestion.   DDX for dyspnea includes but is not limited to: Patient is tachypneic with coarse breath sounds and hypoxia requiring intubation.  Consider likely aspiration in the setting of seizures and altered mental status.  Also consider pneumonia.  No pulmonary edema or pneumothorax or consolidative pneumonia noted on her chest x-ray.   Clinical Course as of 05/30/23 1338  Wed May 30, 2023  0931 ETT pulled from 23 to 21 cm d/t R mainstem [HN]  0931 Patient hypotensive to 70s systolic after propofol . Levophed  hung. D/w Dr. Doretta Gant who would prefer sedation with versed .  [HN]  0932 DG Chest Portable 1 View CXR clear [HN]  1013  pH, Ven(!): 7.469 Mild respiratory alkalosis.  [HN]  1013 Calcium  Ionized(!!): 0.87 +hypocalcemia, will replete [HN]  1014 Consulted to intensivist [HN]    Clinical Course User Index [HN] Merdis Stalling, MD    Labs: I Ordered, and personally interpreted labs.  The pertinent results include: Those listed above  Imaging Studies ordered: I ordered imaging studies including CTH, CXR, abd XR I independently visualized and interpreted imaging. I agree with the radiologist interpretation  Additional history obtained from chart review, EMS, daughter at bedside.    Cardiac Monitoring: The patient was maintained on a cardiac monitor.  I personally viewed and interpreted the cardiac monitored which showed an underlying rhythm of: Normal sinus rhythm  Reevaluation: After the interventions noted above, I reevaluated the patient and found that they have :improved  Social Determinants of Health:  lives at facility  Disposition: Admitted to ICU w/ neurology following  Co morbidities that complicate the patient evaluation  Past Medical History:  Diagnosis Date   Cancer (HCC)    BREAST CANCER   CHF (congestive heart failure) (HCC)    DM (diabetes mellitus), secondary, with peripheral vascular complications (HCC)    Hyperlipidemia    Hypertension    Schizoaffective disorder (HCC)    Stroke (HCC)      Medicines Meds ordered this encounter  Medications   LORazepam  (ATIVAN ) 2 MG/ML injection    Rudisill, Baker Bon L: cabinet override   dextrose  50 % solution    Mack, Hannah K: cabinet override   FOLLOWED BY Linked Order Group    levETIRAcetam  (KEPPRA ) IVPB 1000 mg/100 mL premix    levETIRAcetam  (KEPPRA ) IVPB 1000 mg/100 mL premix   sodium chloride  flush (NS) 0.9 % injection 3 mL   sodium chloride  flush (NS) 0.9 % injection 3 mL   0.9 %  sodium chloride  infusion   propofol  (DIPRIVAN ) 1000 MG/100ML infusion    Rudisill, Tony L: cabinet override   norepinephrine  (LEVOPHED )  4-5  MG/250ML-% infusion SOLN    Rudisill, Tony L: cabinet override    I have reviewed the patients home medicines and have made adjustments as needed  Problem List / ED Course: Problem List Items Addressed This Visit       Other   * (Principal) Seizure (HCC)   Other Visit Diagnoses       Hypoglycemia    -  Primary     Acute respiratory failure with hypoxia (HCC)                       This note was created using dictation software, which may contain spelling or grammatical errors.    Merdis Stalling, MD 06/02/23 [phone]

## 2023-05-30 NOTE — Progress Notes (Signed)
 LTM EEG hooked up and running - no initial skin breakdown - push button tested - [facility] is NOT monitoring at this time, Pt is in the ED. MRI leads applied

## 2023-05-30 NOTE — ED Notes (Signed)
 Patient's IV that levophed  is running through is currently patent without signs of infiltration.

## 2023-05-31 ENCOUNTER — Inpatient Hospital Stay (HOSPITAL_COMMUNITY)

## 2023-05-31 DIAGNOSIS — E876 Hypokalemia: Secondary | ICD-10-CM | POA: Diagnosis not present

## 2023-05-31 DIAGNOSIS — R569 Unspecified convulsions: Secondary | ICD-10-CM

## 2023-05-31 DIAGNOSIS — J9600 Acute respiratory failure, unspecified whether with hypoxia or hypercapnia: Secondary | ICD-10-CM

## 2023-05-31 DIAGNOSIS — I959 Hypotension, unspecified: Secondary | ICD-10-CM

## 2023-05-31 DIAGNOSIS — E162 Hypoglycemia, unspecified: Secondary | ICD-10-CM | POA: Diagnosis not present

## 2023-05-31 DIAGNOSIS — E43 Unspecified severe protein-calorie malnutrition: Secondary | ICD-10-CM | POA: Insufficient documentation

## 2023-05-31 LAB — PROTIME-INR
INR: 1.3 — ABNORMAL HIGH (ref 0.8–1.2)
Prothrombin Time: 15.9 s — ABNORMAL HIGH (ref 11.4–15.2)

## 2023-05-31 LAB — BASIC METABOLIC PANEL WITH GFR
Anion gap: 9 (ref 5–15)
BUN: 12 mg/dL (ref 8–23)
CO2: 24 mmol/L (ref 22–32)
Calcium: 8.7 mg/dL — ABNORMAL LOW (ref 8.9–10.3)
Chloride: 111 mmol/L (ref 98–111)
Creatinine, Ser: 0.52 mg/dL (ref 0.44–1.00)
GFR, Estimated: >60 mL/min (ref >60)
Glucose, Bld: 153 mg/dL — ABNORMAL HIGH (ref 70–99)
Potassium: 3.5 mmol/L (ref 3.5–5.1)
Sodium: 144 mmol/L (ref 135–145)

## 2023-05-31 LAB — MRSA NEXT GEN BY PCR, NASAL: MRSA by PCR Next Gen: NOT DETECTED

## 2023-05-31 LAB — GLUCOSE, CAPILLARY
Glucose-Capillary: 119 mg/dL — ABNORMAL HIGH (ref 70–99)
Glucose-Capillary: 137 mg/dL — ABNORMAL HIGH (ref 70–99)
Glucose-Capillary: 141 mg/dL — ABNORMAL HIGH (ref 70–99)
Glucose-Capillary: 111 mg/dL — ABNORMAL HIGH (ref 70–99)
Glucose-Capillary: 133 mg/dL — ABNORMAL HIGH (ref 70–99)
Glucose-Capillary: 152 mg/dL — ABNORMAL HIGH (ref 70–99)

## 2023-05-31 LAB — CBC
HCT: 31.3 % — ABNORMAL LOW (ref 36.0–46.0)
Hemoglobin: 9.5 g/dL — ABNORMAL LOW (ref 12.0–15.0)
MCH: 26.7 pg (ref 26.0–34.0)
MCHC: 30.4 g/dL (ref 30.0–36.0)
MCV: 87.9 fL (ref 80.0–100.0)
Platelets: 333 10*3/uL (ref 150–400)
RBC: 3.56 MIL/uL — ABNORMAL LOW (ref 3.87–5.11)
RDW: 18.6 % — ABNORMAL HIGH (ref 11.5–15.5)
WBC: 25.4 10*3/uL — ABNORMAL HIGH (ref 4.0–10.5)
nRBC: 0 % (ref 0.0–0.2)

## 2023-05-31 LAB — APTT: aPTT: 33 s (ref 24–36)

## 2023-05-31 LAB — LIPID PANEL
Cholesterol: 107 mg/dL (ref 0–200)
HDL: 33 mg/dL — ABNORMAL LOW (ref >40)
LDL Cholesterol: 63 mg/dL (ref 0–99)
Total CHOL/HDL Ratio: 3.2 ratio
Triglycerides: 54 mg/dL (ref <150)
VLDL: 11 mg/dL (ref 0–40)

## 2023-05-31 LAB — PHOSPHORUS: Phosphorus: 1.8 mg/dL — ABNORMAL LOW (ref 2.5–4.6)

## 2023-05-31 LAB — MAGNESIUM: Magnesium: 1.8 mg/dL (ref 1.7–2.4)

## 2023-05-31 LAB — CBG MONITORING, ED: Glucose-Capillary: 163 mg/dL — ABNORMAL HIGH (ref 70–99)

## 2023-05-31 MED ORDER — POTASSIUM PHOSPHATES 15 MMOLE/5ML IV SOLN
30.0000 mmol | Freq: Once | INTRAVENOUS | Status: AC
  Administered 2023-05-31: 30 mmol via INTRAVENOUS
  Filled 2023-05-31: qty 10

## 2023-05-31 MED ORDER — ORAL CARE MOUTH RINSE
15.0000 mL | OROMUCOSAL | Status: DC
  Administered 2023-05-31 – 2023-06-02 (×10): 15 mL via OROMUCOSAL

## 2023-05-31 MED ORDER — FREE WATER
100.0000 mL | Freq: Four times a day (QID) | Status: DC
  Administered 2023-05-31 – 2023-06-05 (×20): 100 mL

## 2023-05-31 MED ORDER — THIAMINE MONONITRATE 100 MG PO TABS
100.0000 mg | ORAL_TABLET | Freq: Every day | ORAL | Status: DC
  Administered 2023-05-31 – 2023-06-05 (×6): 100 mg
  Filled 2023-05-31 (×6): qty 1

## 2023-05-31 MED ORDER — K PHOS MONO-SOD PHOS DI & MONO 155-852-130 MG PO TABS: 500.0000 mg | ORAL_TABLET | Freq: Two times a day (BID) | ORAL | Status: DC

## 2023-05-31 MED ORDER — ORAL CARE MOUTH RINSE: 15.0000 mL | OROMUCOSAL | Status: DC | PRN

## 2023-05-31 MED ORDER — PROSOURCE TF20 ENFIT COMPATIBL EN LIQD
60.0000 mL | Freq: Two times a day (BID) | ENTERAL | Status: DC
  Administered 2023-05-31 – 2023-06-12 (×24): 60 mL
  Filled 2023-05-31 (×24): qty 60

## 2023-05-31 MED ORDER — ORAL CARE MOUTH RINSE
15.0000 mL | OROMUCOSAL | Status: DC
  Administered 2023-05-31 (×6): 15 mL via OROMUCOSAL

## 2023-05-31 MED ORDER — GADOBUTROL 1 MMOL/ML IV SOLN
7.0000 mL | Freq: Once | INTRAVENOUS | Status: AC | PRN
  Administered 2023-05-31: 7 mL via INTRAVENOUS

## 2023-05-31 MED ORDER — CHLORHEXIDINE GLUCONATE CLOTH 2 % EX PADS
6.0000 | MEDICATED_PAD | Freq: Every day | CUTANEOUS | Status: DC
  Administered 2023-05-31 – 2023-06-01 (×2): 6 via TOPICAL

## 2023-05-31 MED ORDER — ACETAMINOPHEN 325 MG PO TABS
650.0000 mg | ORAL_TABLET | ORAL | Status: AC | PRN
Start: 2023-05-31 — End: ?
  Administered 2023-05-31 – 2023-06-05 (×3): 650 mg
  Filled 2023-05-31 (×3): qty 2

## 2023-05-31 MED ORDER — OSMOLITE 1.5 CAL PO LIQD
1000.0000 mL | ORAL | Status: DC
  Administered 2023-05-31 – 2023-06-04 (×4): 1000 mL
  Filled 2023-05-31: qty 1000

## 2023-05-31 MED ORDER — ADULT MULTIVITAMIN W/MINERALS CH
1.0000 | ORAL_TABLET | Freq: Every day | ORAL | Status: DC
  Administered 2023-05-31 – 2023-06-05 (×6): 1
  Filled 2023-05-31 (×6): qty 1

## 2023-05-31 NOTE — Progress Notes (Signed)
 Pt transferred from MRI to 4N16 on ventilator with no complications

## 2023-05-31 NOTE — Progress Notes (Signed)
 Patient self extubated. No stridor noted. VS stable. HR-85, RR-18, BP 115/54/, Sp02-100 on 2L nasal cannula.

## 2023-05-31 NOTE — Progress Notes (Signed)
 Initial Nutrition Assessment  DOCUMENTATION CODES:   Severe malnutrition in context of chronic illness  INTERVENTION:   Initiate tube feeding via NG/Cortrak: Osmolite 1.5 at 20 ml/h and increase by 10 ml every 8 hours to goal rate of 40 ml/hr (960 ml per day)  Prosource TF20 60 ml BID  Provides 1600 kcal, 100 gm protein, 729 ml free water  daily  100 ml free water  flush every 6 hours  100 mg thiamine  daily x 7 days  MVI with minerals daily  Monitor magnesium  and phosphorus x 4 occurrences, MD to replete as needed, as pt is at risk for refeeding syndrome given pt meets criteria for severe malnutrition.   NUTRITION DIAGNOSIS:   Severe Malnutrition related to chronic illness as evidenced by severe muscle depletion, severe fat depletion.  GOAL:   Patient will meet greater than or equal to 90% of their needs  MONITOR:   TF tolerance, Diet advancement  REASON FOR ASSESSMENT:   Consult Enteral/tube feeding initiation and management  ASSESSMENT:   Pt with PMH of prior CVA with residual weakness and deficits, schizoaffective disorder, CHF, DM, dementia, functional decline, breast ca s/p XRT and mastectomy 2020 who lives at [facility] admitted for seizures suspected due to hypoglycemia.  No family at bedside. Spoke with RN about slow advancement of TF. NG in placed with plans for extubation today. Currently on vent.  Noted per chart review pt has hx of functional decline and FTT. Per weight hx pt has lost 20% of her weight from 2022 to 2023.  Cortrak order pending.   Medications reviewed and include: colace, protonix , miralax  D5 LR with 20 mEq KCl/L @ 40 ml/hr Versed  Levo @ 7 mcg 30 mmol Kphos x 1   Labs reviewed:  Phos 1.8 Mag 1.8 CBG's: 119-163  L NG: per xray tip in proximal stomach  NUTRITION - FOCUSED PHYSICAL EXAM:  Flowsheet Row Most Recent Value  Orbital Region Severe depletion  Upper Arm Region Severe depletion  Thoracic and Lumbar Region Severe  depletion  Buccal Region Unable to assess  Temple Region Severe depletion  Clavicle Bone Region Severe depletion  Clavicle and Acromion Bone Region Severe depletion  Scapular Bone Region Severe depletion  Dorsal Hand Severe depletion  Patellar Region Severe depletion  Anterior Thigh Region Severe depletion  Posterior Calf Region Severe depletion  Edema (RD Assessment) Severe  [RUE]  Hair Reviewed  Eyes Unable to assess  Mouth Unable to assess  Skin Reviewed  Nails Reviewed       Diet Order:   Diet Order             Diet NPO time specified  Diet effective now                   EDUCATION NEEDS:   Not appropriate for education at this time  Skin:  Skin Assessment: Skin Integrity Issues: Skin Integrity Issues:: DTI DTI: buttocks  Last BM:  4/24 type 6, medium - FMS added  Height:   Ht Readings from Last 1 Encounters:  05/30/23 5\' 6"  (1.676 m)    Weight:   Wt Readings from Last 1 Encounters:  05/30/23 64.4 kg    BMI:  Body mass index is 22.92 kg/m.  Estimated Nutritional Needs:   Kcal:  1500-1700  Protein:  95-105 grams  Fluid:  >1.5 L/day  Randine Butcher., RD, LDN, CNSC See AMiON for contact information

## 2023-05-31 NOTE — ED Notes (Signed)
 Patient transported to MRI with RT without incident, report given to Cleora Daft RN at bedside in MRI.

## 2023-05-31 NOTE — Procedures (Addendum)
 Patient Name: AMBER WILLIARD  MRN: [id number]  Epilepsy Attending: Arleene Lack  Referring Physician/Provider: Eleni Griffin, MD  Duration: 05/30/2023 1103 to 05/31/2023 7253  Patient history: 82yo f with seizures in setting of hypoglycemia. EEG to evaluate for seizure  Level of alertness: comatose/ lethargic   AEDs during EEG study: Versed   Technical aspects: This EEG study was done with scalp electrodes positioned according to the 10-20 International system of electrode placement. Electrical activity was reviewed with band pass filter of 1-70Hz , sensitivity of 7 uV/mm, display speed of 75mm/sec with a 60Hz  notched filter applied as appropriate. EEG data were recorded continuously and digitally stored.  Video monitoring was available and reviewed as appropriate.  Description: EEG initially showed continuous generalized and lateralized left hemisphere 3 to 6 Hz theta-delta slowing admixed with 13-15hz  beta activity. Gradually EEG improved and showed posterior dominant rhythm of 7.5 Hz activity of moderate voltage (25-35 uV) seen predominantly in posterior head regions, symmetric and reactive to eye opening and eye closing. Sleep was characterized by vertex waves, sleep spindles (12 to 14 Hz), maximal frontocentral region. EEG also showed near continuous 3-6hz  theta-delta slowing in left hemisphere. Hyperventilation and photic stimulation were not performed.     ABNORMALITY - Continuous slow, generalized and lateralized left hemisphere  IMPRESSION: This study is suggestive of cortical dysfunction arising from left hemisphere likely secondary to underlying structural abnormality, post-ictal state. Additionally there was moderate to severe diffuse encephalopathy likely due to sedation which gradually resolved. No seizures or epileptiform discharges were seen throughout the recording.  Whitaker Holderman O Alyzza Andringa

## 2023-05-31 NOTE — Progress Notes (Signed)
 Pt. Transported to MRI with RT and RN on 100% O2 without any complications and relieved by another RT to monitor pt. During scan.

## 2023-05-31 NOTE — Progress Notes (Signed)
 LTM maint complete - no skin breakdown seen. Patient transported from ED to MRI to 4N. Study additional time due to cord misplaced, during transport, all parts connected study running [facility] monitored, Event button test confirmed by [facility].

## 2023-05-31 NOTE — Progress Notes (Signed)
 Neurology plan of care  Carolyn Parrish is a 83 y.o. 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 She was intubated and admitted to [facility].  Please see neurology consult note from yesterday for full findings and initial recommendations.  She was placed on continuous EEG which showed only generalized slowing and more pronounced left focal slowing.  There were no interictal's or electrographic seizures seen and EEG was discontinued.  MRI brain with and without contrast was personally reviewed and showed no acute intracranial abnormality.  Seizure was provoked by severe hypoglycemia and resolved with treatment therefore there is no indication for continuation of Keppra .  CCM plans to wean sedation and extubate when able.  Neurology to sign off but please reengage if additional neurologic concerns arise.  She may follow-up with her primary care doctor.  Greg Leaks, MD [facility] [phone]  If 7pm- 7am, please page neurology on call as listed in AMION.

## 2023-05-31 NOTE — TOC Initial Note (Signed)
 Transition of Care [facility]) - Initial/Assessment Note    Patient Details  Name: Carolyn Parrish MRN: [id number] Date of Birth: Jan 02, 1941  Transition of Care [facility]) CM/SW Contact:    Jannice Mends, LCSW Phone Number: 05/31/2023, 9:50 AM  Clinical Narrative:                 Patient admitted from [facility] and is currently intubated. CSW will continue to follow.   Expected Discharge Plan: Skilled Nursing Facility Barriers to Discharge: Continued Medical Work up   Patient Goals and CMS Choice            Expected Discharge Plan and Services In-house Referral: Clinical Social Work   Post Acute Care Choice: Skilled Nursing Facility Living arrangements for the past 2 months: Skilled Nursing Facility                                      Prior Living Arrangements/Services Living arrangements for the past 2 months: Skilled Nursing Facility Lives with:: Facility Resident Patient language and need for interpreter reviewed:: Yes Do you feel safe going back to the place where you live?: Yes      Need for Family Participation in Patient Care: Yes (Comment) Care giver support system in place?: Yes (comment)   Criminal Activity/Legal Involvement Pertinent to Current Situation/Hospitalization: No - Comment as needed  Activities of Daily Living      Permission Sought/Granted Permission sought to share information with : Facility Medical sales representative, Family Supports Permission granted to share information with : No     Permission granted to share info w AGENCY: [facility]        Emotional Assessment Appearance:: Appears stated age Attitude/Demeanor/Rapport: Unable to Assess Affect (typically observed): Unable to Assess Orientation: :  (Intubated) Alcohol / Substance Use: Not Applicable Psych Involvement: No (comment)  Admission diagnosis:  Seizure (HCC) [R56.9] Hypoglycemia [E16.2] Acute respiratory failure with hypoxia (HCC) [J96.01] Patient Active Problem  List   Diagnosis Date Noted   Seizure (HCC) 05/30/2023   Adult failure to thrive 09/07/2020   Dyslipidemia 01/23/2019   Normochromic normocytic anemia 01/23/2019   Vitamin D  deficiency 01/23/2019   Hypertensive heart disease with heart failure (HCC) 10/17/2018   Essential hypertension 08/01/2018   Vascular dementia (HCC) 01/22/2018   Acute hypernatremia 01/09/2018   Acute encephalopathy 01/08/2018   Dehydration    Sepsis (HCC) 11/14/2017   Septic shock (HCC) 11/14/2017   AKI (acute kidney injury) (HCC)    Lactic acidosis    Bipolar disorder (HCC)    History of completed stroke    Breast cancer, stage 2, left (HCC) 10/30/2017   Encounter for central line placement 09/12/2017   History of stroke 09/12/2017   DM (diabetes mellitus), type 2 with peripheral vascular complications (HCC) 09/12/2017   Schizoaffective disorder (HCC) 09/12/2017   Malignant neoplasm of upper-inner quadrant of left breast in female, estrogen receptor positive (HCC) 09/07/2017   PCP:  Pcp, No Pharmacy:  No Pharmacies Listed    Social Drivers of Health (SDOH) Social History: SDOH Screenings   Food Insecurity: No Food Insecurity (01/15/2018)  Transportation Needs: No Transportation Needs (01/15/2018)  Depression (PHQ2-9): Medium Risk (12/03/2018)  Financial Resource Strain: Low Risk  (01/15/2018)  Physical Activity: Inactive (01/15/2018)  Social Connections: Moderately Isolated (01/15/2018)  Stress: Stress Concern Present (01/15/2018)  Tobacco Use: Low Risk  (05/30/2023)   SDOH Interventions:     Readmission Risk  Interventions     No data to display

## 2023-05-31 NOTE — Progress Notes (Signed)
 eLink Physician-Brief Progress Note Patient Name: LOELLA HICKLE DOB: Jul 27, 1940 MRN: [id number]   Date of Service  05/31/2023  HPI/Events of Note  Patient admitted with hypoglycemia, suspected hypoglycemic encephalopathy , acute hypoxemic / hypercapnic respiratory failure requiring intubation and mechanical ventilation, work up is in progress.  eICU Interventions  New Patient Evaluation.        Sybrina Laning U Latrisa Hellums 05/31/2023, 4:16 AM

## 2023-05-31 NOTE — Progress Notes (Signed)
 LTM EEG discontinued - no skin breakdown at [facility].

## 2023-05-31 NOTE — ED Notes (Addendum)
 Mepilex placed on sacrum and bilat heels. Heels floated with pillows, patient offloaded to right with pillow. IV with levophed  patent with no signs of infiltration.

## 2023-05-31 NOTE — ED Notes (Signed)
 EEG called x 2 with no answer for notification that patient is being removed from EEG to go to MRI.

## 2023-05-31 NOTE — H&P (Signed)
 NAME:  Carolyn Parrish, MRN:  [id number], DOB:  [birth year], LOS: 1 ADMISSION DATE:  05/30/2023, CONSULTATION DATE:  05/30/23 REFERRING MD:  Drury Geralds CHIEF COMPLAINT:  AMS   History of Present Illness:  Pt is encephelopathic; therefore, this HPI is obtained from chart review.Carolyn Parrish is a 83 y.o. female who has a PMH as below including but not limited to prior CVA with residual weakness and deficits, dementia, functional decline, breast CA s/p XRT and mastectomy 2020. She resides at [facility]. She was in her usual state of health 4/22 but that night suffered a fall followed by some "behavioral changes" that were not normal.  On morning of 4/23, she remained altered and had a right gaze deviation prompting EMS to be called.  She was brought to [facility] ED as code stroke; however, seizures noted on arrival and CBG was 17. She was given an amp of D50 with resolution of seizures. CT head was negative. She remained altered and possibly post ictal with concern for airway protection. DNR form noted; however, per discussions with daughter, this was primarily in regards to chest compressions/defib and she was ok with intubation; therefore, she was intubated for airway protection.  Neurology was consulted and suspected AMS was secondary to seizures in setting of profound hypoglycemia.  Pertinent  Medical History:  has Malignant neoplasm of upper-inner quadrant of left breast in female, estrogen receptor positive (HCC); Encounter for central line placement; History of stroke; DM (diabetes mellitus), type 2 with peripheral vascular complications (HCC); Schizoaffective disorder (HCC); Breast cancer, stage 2, left (HCC); Sepsis (HCC); Septic shock (HCC); AKI (acute kidney injury) (HCC); Lactic acidosis; Bipolar disorder (HCC); History of completed stroke; Dehydration; Acute encephalopathy; Acute hypernatremia; Vascular dementia (HCC); Essential hypertension; Hypertensive heart disease with heart failure (HCC);  Dyslipidemia; Normochromic normocytic anemia; Vitamin D  deficiency; Adult failure to thrive; and Seizure (HCC) on their problem list.   Significant Hospital Events: Including procedures, antibiotic start and stop dates in addition to other pertinent events   4/23 admit.  Interim History / Subjective:  No further seizure activity overnight. BG have been mid-100s on D5-LR.   Objective:  Blood pressure (!) 100/57, pulse 71, temperature 97.9 F (36.6 C), temperature source Axillary, resp. rate 18, height 5\' 6"  (1.676 m), weight 64.4 kg, SpO2 100%.    Vent Mode: PRVC FiO2 (%):  [40 %-100 %] 40 % Set Rate:  [18 bmp] 18 bmp Vt Set:  [470 mL] 470 mL PEEP:  [5 cmH20] 5 cmH20 Plateau Pressure:  [14 cmH20-27 cmH20] 23 cmH20   Intake/Output Summary (Last 24 hours) at 05/31/2023 0818 Last data filed at 05/31/2023 0600 Gross per 24 hour  Intake 3019.03 ml  Output 425 ml  Net 2594.03 ml   Filed Weights   05/30/23 0800  Weight: 64.4 kg    Examination: General: Elderly female, in NAD. Neuro: Sedated,minimally responsive.  HEENT: ETT and OGT in place.  Cardiovascular: Normal HS Lungs: On full support. Chest clear Abdomen: soft.  Musculoskeletal: Muscle wasting. Small right knee abrasion that is superficial, no edema.  Skin: Intact, warm, no rashes.  CEEG- no evidence of seizures on personal review   Labs/imaging personally reviewed:  CT head 4/23 > neg. MRI brain 4/23 > age related changes.    Assessment & Plan:   Seizures - suspected 2/2 hypoglycemia, resolved after D50. Acute respiratory insufficiency with inability to protect the airway - 2/2 above. Now s/p intubation. Hypotension - presumed sedation related, not felt to be 2/2 sepsis.  Hypokalemia - replete. Hypocalcemia - being repleted already. Hx CVA, schizoaffective disorder. Hx HTN, HLD, CHF per report (no echo available).  Plan:  - Establish NG enteral access to ensure nutrition post-extubation  - Once done, wean  sedation and attempt to extubate.   Best practice (evaluated daily):  Diet/type: NPO - start tube feeds.  DVT prophylaxis: prophylactic heparin   Pressure ulcer(s): pressure ulcer assessment deferred  GI prophylaxis: PPI Lines: N/A Foley:  Yes, and it is still needed Code Status:  DNR Last date of multidisciplinary goals of care discussion: None yet. CRITICAL CARE Performed by: Arlina Lair   Total critical care time: 40 minutes  Critical care time was exclusive of separately billable procedures and treating other patients.  Critical care was necessary to treat or prevent imminent or life-threatening deterioration.  Critical care was time spent personally by me on the following activities: development of treatment plan with patient and/or surrogate as well as nursing, discussions with consultants, evaluation of patient's response to treatment, examination of patient, obtaining history from patient or surrogate, ordering and performing treatments and interventions, ordering and review of laboratory studies, ordering and review of radiographic studies, pulse oximetry, re-evaluation of patient's condition and participation in multidisciplinary rounds.  Arlina Lair, MD [facility] ICU Physician [facility] [place] [facility]  Pager: [phone] Mobile: [phone] After hours: [phone].   05/31/2023, 8:18 AM

## 2023-05-31 NOTE — Progress Notes (Signed)
MB performed maintenance on electrodes. All impedances are below 10k ohms. No skin breakdown noted.  

## 2023-05-31 NOTE — ED Notes (Signed)
 Patient's daughter called and updated on patient's bed status.

## 2023-06-01 ENCOUNTER — Inpatient Hospital Stay (HOSPITAL_COMMUNITY)

## 2023-06-01 DIAGNOSIS — E785 Hyperlipidemia, unspecified: Secondary | ICD-10-CM

## 2023-06-01 DIAGNOSIS — J9600 Acute respiratory failure, unspecified whether with hypoxia or hypercapnia: Secondary | ICD-10-CM | POA: Diagnosis not present

## 2023-06-01 DIAGNOSIS — A419 Sepsis, unspecified organism: Secondary | ICD-10-CM | POA: Diagnosis not present

## 2023-06-01 DIAGNOSIS — N390 Urinary tract infection, site not specified: Secondary | ICD-10-CM | POA: Diagnosis not present

## 2023-06-01 DIAGNOSIS — I10 Essential (primary) hypertension: Secondary | ICD-10-CM

## 2023-06-01 DIAGNOSIS — R569 Unspecified convulsions: Secondary | ICD-10-CM | POA: Diagnosis not present

## 2023-06-01 LAB — BASIC METABOLIC PANEL WITH GFR
Anion gap: 8 (ref 5–15)
BUN: 14 mg/dL (ref 8–23)
CO2: 22 mmol/L (ref 22–32)
Calcium: 8.1 mg/dL — ABNORMAL LOW (ref 8.9–10.3)
Chloride: 110 mmol/L (ref 98–111)
Creatinine, Ser: 0.43 mg/dL — ABNORMAL LOW (ref 0.44–1.00)
GFR, Estimated: >60 mL/min (ref >60)
Glucose, Bld: 121 mg/dL — ABNORMAL HIGH (ref 70–99)
Potassium: 4.1 mmol/L (ref 3.5–5.1)
Sodium: 140 mmol/L (ref 135–145)

## 2023-06-01 LAB — ECHOCARDIOGRAM COMPLETE BUBBLE STUDY
Area-P 1/2: 3.85 cm2
Calc EF: 61.2 %
Height: 66 in
S' Lateral: 2.8 cm
Single Plane A2C EF: 64.7 %
Single Plane A4C EF: 57.3 %
Weight: 2292.78 [oz_av]

## 2023-06-01 LAB — MAGNESIUM: Magnesium: 1.8 mg/dL (ref 1.7–2.4)

## 2023-06-01 LAB — PHOSPHORUS: Phosphorus: 2.7 mg/dL (ref 2.5–4.6)

## 2023-06-01 LAB — GLUCOSE, CAPILLARY
Glucose-Capillary: 167 mg/dL — ABNORMAL HIGH (ref 70–99)
Glucose-Capillary: 125 mg/dL — ABNORMAL HIGH (ref 70–99)
Glucose-Capillary: 112 mg/dL — ABNORMAL HIGH (ref 70–99)
Glucose-Capillary: 137 mg/dL — ABNORMAL HIGH (ref 70–99)
Glucose-Capillary: 138 mg/dL — ABNORMAL HIGH (ref 70–99)
Glucose-Capillary: 136 mg/dL — ABNORMAL HIGH (ref 70–99)

## 2023-06-01 LAB — CBC
HCT: 30.1 % — ABNORMAL LOW (ref 36.0–46.0)
Hemoglobin: 9 g/dL — ABNORMAL LOW (ref 12.0–15.0)
MCH: 26.7 pg (ref 26.0–34.0)
MCHC: 29.9 g/dL — ABNORMAL LOW (ref 30.0–36.0)
MCV: 89.3 fL (ref 80.0–100.0)
Platelets: 287 10*3/uL (ref 150–400)
RBC: 3.37 MIL/uL — ABNORMAL LOW (ref 3.87–5.11)
RDW: 18.7 % — ABNORMAL HIGH (ref 11.5–15.5)
WBC: 17.1 10*3/uL — ABNORMAL HIGH (ref 4.0–10.5)
nRBC: 0 % (ref 0.0–0.2)

## 2023-06-01 MED ORDER — MAGNESIUM SULFATE 2 GM/50ML IV SOLN
2.0000 g | Freq: Once | INTRAVENOUS | Status: AC
  Administered 2023-06-01: 2 g via INTRAVENOUS
  Filled 2023-06-01: qty 50

## 2023-06-01 MED ORDER — ALBUMIN HUMAN 25 % IV SOLN
25.0000 g | Freq: Four times a day (QID) | INTRAVENOUS | Status: AC
  Administered 2023-06-01 – 2023-06-02 (×4): 25 g via INTRAVENOUS
  Filled 2023-06-01 (×4): qty 100

## 2023-06-01 MED ORDER — SODIUM CHLORIDE 0.9 % IV SOLN
2.0000 g | INTRAVENOUS | Status: AC
  Administered 2023-06-01 – 2023-06-07 (×7): 2 g via INTRAVENOUS
  Filled 2023-06-01 (×7): qty 20

## 2023-06-01 MED ORDER — MIDODRINE HCL 5 MG PO TABS
10.0000 mg | ORAL_TABLET | Freq: Three times a day (TID) | ORAL | Status: DC
  Administered 2023-06-01 – 2023-06-03 (×6): 10 mg
  Filled 2023-06-01 (×6): qty 2

## 2023-06-01 MED ORDER — CHLORHEXIDINE GLUCONATE CLOTH 2 % EX PADS
6.0000 | MEDICATED_PAD | Freq: Every day | CUTANEOUS | Status: DC
  Administered 2023-06-02 – 2023-06-05 (×4): 6 via TOPICAL

## 2023-06-01 MED ORDER — PHENYLEPHRINE HCL-NACL 20-0.9 MG/250ML-% IV SOLN
25.0000 ug/min | INTRAVENOUS | Status: DC
  Administered 2023-06-01: 25 ug/min via INTRAVENOUS
  Filled 2023-06-01: qty 250

## 2023-06-01 MED ORDER — SODIUM CHLORIDE 0.9 % IV SOLN: 250.0000 mL | INTRAVENOUS | Status: AC

## 2023-06-01 NOTE — Progress Notes (Signed)
  Echocardiogram 2D Echocardiogram has been performed.  Carolyn Parrish 06/01/2023, 9:08 AM

## 2023-06-01 NOTE — Progress Notes (Signed)
 NAME:  Carolyn Parrish, MRN:  [id number], DOB:  February 16, 1940, LOS: 2 ADMISSION DATE:  05/30/2023, CONSULTATION DATE:  05/30/23 REFERRING MD:  Drury Geralds CHIEF COMPLAINT:  AMS   History of Present Illness:  Pt is encephelopathic; therefore, this HPI is obtained from chart review.  Carolyn Parrish is a 83 y.o. female who has a PMH as below including but not limited to prior CVA with residual weakness and deficits, dementia, functional decline, breast CA s/p XRT and mastectomy 2020. She resides at [facility]. She was in her usual state of health 4/22 but that night suffered a fall followed by some "behavioral changes" that were not normal.  On morning of 4/23, she remained altered and had a right gaze deviation prompting EMS to be called.  She was brought to [facility] ED as code stroke; however, seizures noted on arrival and CBG was 17. She was given an amp of D50 with resolution of seizures. CT head was negative. She remained altered and possibly post ictal with concern for airway protection. DNR form noted; however, per discussions with daughter, this was primarily in regards to chest compressions/defib and she was ok with intubation; therefore, she was intubated for airway protection.  Neurology was consulted and suspected AMS was secondary to seizures in setting of profound hypoglycemia.  Pertinent  Medical History:  has Malignant neoplasm of upper-inner quadrant of left breast in female, estrogen receptor positive (HCC); Encounter for central line placement; History of stroke; DM (diabetes mellitus), type 2 with peripheral vascular complications (HCC); Schizoaffective disorder (HCC); Breast cancer, stage 2, left (HCC); Sepsis (HCC); Septic shock (HCC); AKI (acute kidney injury) (HCC); Lactic acidosis; Bipolar disorder (HCC); History of completed stroke; Dehydration; Acute encephalopathy; Acute hypernatremia; Vascular dementia (HCC); Essential hypertension; Hypertensive heart disease with heart failure  (HCC); Dyslipidemia; Normochromic normocytic anemia; Vitamin D  deficiency; Adult failure to thrive; Seizure (HCC); and Protein-calorie malnutrition, severe on their problem list.   Significant Hospital Events: Including procedures, antibiotic start and stop dates in addition to other pertinent events   4/23 admit as code stroke evaluation on admission revealed seizure activity in the setting of severe hypoglycemia.   4/24 self extubated late afternoon 4/25 remains on peripheral pressors with low-grade fever and worsening leukocytosis.  UA with many bacteria, nitrates, and leukocytes  Interim History / Subjective:  Opens eyes to verbal stimuli, can state name  Objective:  Blood pressure (!) 99/49, pulse 67, temperature 98.1 F (36.7 C), temperature source Axillary, resp. rate 17, height 5\' 6"  (1.676 m), weight 65 kg, SpO2 100%.    Vent Mode: Stand-by FiO2 (%):  [40 %] 40 % Set Rate:  [18 bmp] 18 bmp Vt Set:  [470 mL] 470 mL PEEP:  [5 cmH20] 5 cmH20 Pressure Support:  [7 cmH20] 7 cmH20 Plateau Pressure:  [20 cmH20-23 cmH20] 20 cmH20   Intake/Output Summary (Last 24 hours) at 06/01/2023 0736 Last data filed at 06/01/2023 0700 Gross per 24 hour  Intake 2802.36 ml  Output 460 ml  Net 2342.36 ml   Filed Weights   05/30/23 0800 06/01/23 0500  Weight: 64.4 kg 65 kg    Examination: General: Acute on chronic ill-appearing deconditioned elderly female lying in bed in no acute distress HEENT: [place]/AT, MM pink/moist, PERRL,  Neuro: Alert to self, nonfocal CV: s1s2 regular rate and rhythm, no murmur, rubs, or gallops,  PULM: Clear to auscultation bilaterally, no increased work of breathing, no added breath sounds GI: soft, bowel sounds active in all 4 quadrants, non-tender, non-distended,  tolerating TF Extremities: warm/dry, no edema  Skin: no rashes or lesions  Resolved problems  Acute respiratory insufficiency with inability to protect the airway  - Self extubated afternoon of  4/24  Assessment & Plan:   New onset seizures suspected 2/2 hypoglycemia - Resolved  -Presented as code stroke; however, seizures noted on arrival and CBG was 17. She was given an amp of D50 with resolution of seizures. CT and MRI head was negative.  Hx CVA, schizoaffective disorder P: Neuro protective measures Delirium precautions Secondary stroke prevention Seizure precautions Continue D5 LR Minimize sedation as able Per neurology no indications for empiric antiepileptics  Sepsis in the setting of urinary tract infection, POA -initially admitted with normal vital signs, only mild leukocytosis and hypotension was felt secondary to sedation.  However a.m. 4/25 patient's leukocytosis has worsened mild fever and positive UA with many bacteria and nitrates and leukocytes.  P: Continue pressors for MAP goal greater than 65 Start ceftriaxone  Check urine culture and blood cultures Monitor urine output Check echocardiogram  Hypokalemia  Hypocalcemia Hypophosphatemia P: Supplement as needed  Essential hypertension Hyperlipidemia CHF per report (no echo available) P: Check echocardiogram Continuous telemetry Optimize electrolytes Daily weight  At risk malnutrition Hypoalbuminemia P: Place Cortrack Continue tube feeds Optimize protein Supplemental albumin   Best practice (evaluated daily):  Diet/type: NPO - start tube feeds.  DVT prophylaxis: prophylactic heparin   Pressure ulcer(s): pressure ulcer assessment deferred  GI prophylaxis: PPI Lines: N/A Foley:  Yes, and it is still needed Code Status:  DNR Last date of multidisciplinary goals of care discussion: None yet.  CRITICAL CARE Performed by: Abbye Lao D. Harris   Total critical care time: 38 minutes  Critical care time was exclusive of separately billable procedures and treating other patients.  Critical care was necessary to treat or prevent imminent or life-threatening deterioration.  Critical care was  time spent personally by me on the following activities: development of treatment plan with patient and/or surrogate as well as nursing, discussions with consultants, evaluation of patient's response to treatment, examination of patient, obtaining history from patient or surrogate, ordering and performing treatments and interventions, ordering and review of laboratory studies, ordering and review of radiographic studies, pulse oximetry and re-evaluation of patient's condition.  Allante Beane D. Harris, NP-C [facility] Personal contact information can be found on Amion  If no contact or response made please call [phone] 06/01/2023, 7:47 AM

## 2023-06-01 NOTE — Progress Notes (Signed)
 eLink Physician-Brief Progress Note Patient Name: CALEEN TAAFFE DOB: 08-05-1940 MRN: [id number]   Date of Service  06/01/2023  HPI/Events of Note  96/43 MAP 59 Patient is on 10 mcg of peripheral Levophed .  eICU Interventions  Peripheral phenylephrine  gtt ordered.        Trinna Kunst U Mishayla Sliwinski 06/01/2023, 12:47 AM

## 2023-06-01 NOTE — Evaluation (Signed)
 Clinical/Bedside Swallow Evaluation Patient Details  Name: Carolyn Parrish MRN: [id number] Date of Birth: Oct 18, 1940  Today's Date: 06/01/2023 Time: SLP Start Time (ACUTE ONLY): 1552 SLP Stop Time (ACUTE ONLY): 1607 SLP Time Calculation (min) (ACUTE ONLY): 15 min  Past Medical History:  Past Medical History:  Diagnosis Date   Cancer (HCC)    BREAST CANCER   CHF (congestive heart failure) (HCC)    DM (diabetes mellitus), secondary, with peripheral vascular complications (HCC)    Hyperlipidemia    Hypertension    Schizoaffective disorder (HCC)    Stroke [facility])    Past Surgical History:  Past Surgical History:  Procedure Laterality Date   CHOLECYSTECTOMY     2016   MASTECTOMY COMPLETE / SIMPLE Left 10/30/2017   SIMPLE MASTECTOMY WITH AXILLARY SENTINEL NODE BIOPSY Left 10/30/2017   Procedure: LEFT SIMPLE MASTECTOMY;  Surgeon: Sim Dryer, MD;  Location: [facility] OR;  Service: General;  Laterality: Left;   HPI:  Carolyn Parrish is an 83 yo female presenting to ED from [facility] 4/23 with AMS and R gaze deviation. Admitted with status epilepticus in the setting of severe hypoglycemia. Ultimately intubated for airway protection, 4/23 and self-extubated 4/24. PMH includes prior CVA with residual weakness and deficits, dementia, functional decline, breast cancer s/p XRT and mastectomy 2020    Assessment / Plan / Recommendation  Clinical Impression  Per MD note from 2022, pt typically consumes a Dys 3 diet with thin liquids. Unable to reach pt's daughter to confirm. Pt self extubated late in the day previous date and presents with a hoarse vocal quality. She followed all commands but was generally disoriented, which is suspected to be similar to her baseline given history of CVA and dementia. No focal oral motor deficits were noted. No s/s of aspiration were initially noted with controlled sips of thin liquids, but there was congested, explosive coughing when pt was challenged with  consecutive sips. She achieved prompt and complete oral clearance with purees and regular solids despite edentulism. Given recent self-extubation and performance this date, plan to proceed with an MBS for further assesment. Pending results, recommend she remain NPO except ice chips. Cortrak is in place for medication administration, but meds can be given crushed in puree if needed. SLP Visit Diagnosis: Dysphagia, unspecified (R13.10)    Aspiration Risk  Moderate aspiration risk    Diet Recommendation NPO;Ice chips PRN after oral care    Medication Administration: Via alternative means (crushed with puree if needed)    Other  Recommendations Oral Care Recommendations: Oral care QID;Oral care prior to ice chip/H20;Staff/trained caregiver to provide oral care    Recommendations for follow up therapy are one component of a multi-disciplinary discharge planning process, led by the attending physician.  Recommendations may be updated based on patient status, additional functional criteria and insurance authorization.  Follow up Recommendations Skilled nursing-short term rehab (<3 hours/day)      Assistance Recommended at Discharge    Functional Status Assessment Patient has had a recent decline in their functional status and demonstrates the ability to make significant improvements in function in a reasonable and predictable amount of time.  Frequency and Duration min 2x/week  2 weeks       Prognosis Prognosis for improved oropharyngeal function: Good Barriers to Reach Goals: Cognitive deficits;Time post onset      Swallow Study   General HPI: Carolyn Parrish is an 83 yo female presenting to ED from [facility] 4/23 with AMS and R gaze deviation.  Admitted with status epilepticus in the setting of severe hypoglycemia. Ultimately intubated for airway protection, 4/23 and self-extubated 4/24. PMH includes prior CVA with residual weakness and deficits, dementia, functional decline, breast  cancer s/p XRT and mastectomy 2020 Type of Study: Bedside Swallow Evaluation Previous Swallow Assessment: none in chart Diet Prior to this Study: NPO;Cortrak/Small bore NG tube Temperature Spikes Noted: Yes Respiratory Status: Nasal cannula History of Recent Intubation: Yes Total duration of intubation (days): 2 days Date extubated: 05/31/23 (self extubated) Behavior/Cognition: Alert;Cooperative Oral Cavity Assessment: Within Functional Limits Oral Care Completed by SLP: No Oral Cavity - Dentition: Edentulous Vision: Functional for self-feeding Self-Feeding Abilities: Needs assist Patient Positioning: Upright in bed Baseline Vocal Quality: Hoarse Volitional Cough: Strong Volitional Swallow: Able to elicit    Oral/Motor/Sensory Function Overall Oral Motor/Sensory Function: Within functional limits   Ice Chips Ice chips: Not tested   Thin Liquid Thin Liquid: Impaired Presentation: Straw Pharyngeal  Phase Impairments: Wet Vocal Quality;Cough - Immediate    Nectar Thick Nectar Thick Liquid: Not tested   Honey Thick Honey Thick Liquid: Not tested   Puree Puree: Within functional limits Presentation: Spoon   Solid     Solid: Within functional limits      Amil Kale, M.A., CCC-SLP Speech Language Pathology, Acute Rehabilitation Services  Secure Chat preferred [phone]  06/01/2023,4:54 PM

## 2023-06-01 NOTE — Procedures (Signed)
 Cortrak  Person Inserting Tube:  Dorla Gartner C, RD Tube Type:  Cortrak - 43 inches Tube Size:  10 Tube Location:  Left nare Initial Placement:  Stomach Secured by: Bridle Technique Used to Measure Tube Placement:  Marking at nare/corner of mouth Cortrak Secured At:  62 cm   Cortrak Tube Team Note:  Consult received to place a Cortrak feeding tube.   No x-ray is required. RN may begin using tube.   If the tube becomes dislodged please keep the tube and contact the Cortrak team at [email or web address] for replacement.  If after hours and replacement cannot be delayed, place a NG tube and confirm placement with an abdominal x-ray.    Randine Butcher., RD, LDN, CNSC See AMiON for contact information

## 2023-06-02 ENCOUNTER — Inpatient Hospital Stay (HOSPITAL_COMMUNITY)

## 2023-06-02 DIAGNOSIS — A419 Sepsis, unspecified organism: Secondary | ICD-10-CM | POA: Diagnosis not present

## 2023-06-02 DIAGNOSIS — N390 Urinary tract infection, site not specified: Secondary | ICD-10-CM | POA: Diagnosis not present

## 2023-06-02 DIAGNOSIS — R569 Unspecified convulsions: Secondary | ICD-10-CM | POA: Diagnosis not present

## 2023-06-02 LAB — BASIC METABOLIC PANEL WITH GFR
Anion gap: 5 (ref 5–15)
BUN: 14 mg/dL (ref 8–23)
CO2: 28 mmol/L (ref 22–32)
Calcium: 8.4 mg/dL — ABNORMAL LOW (ref 8.9–10.3)
Chloride: 107 mmol/L (ref 98–111)
Creatinine, Ser: 0.32 mg/dL — ABNORMAL LOW (ref 0.44–1.00)
GFR, Estimated: >60 mL/min (ref >60)
Glucose, Bld: 135 mg/dL — ABNORMAL HIGH (ref 70–99)
Potassium: 3.8 mmol/L (ref 3.5–5.1)
Sodium: 140 mmol/L (ref 135–145)

## 2023-06-02 LAB — BLOOD CULTURE ID PANEL (REFLEXED) - BCID2

## 2023-06-02 LAB — GLUCOSE, CAPILLARY
Glucose-Capillary: 168 mg/dL — ABNORMAL HIGH (ref 70–99)
Glucose-Capillary: 134 mg/dL — ABNORMAL HIGH (ref 70–99)
Glucose-Capillary: 104 mg/dL — ABNORMAL HIGH (ref 70–99)
Glucose-Capillary: 113 mg/dL — ABNORMAL HIGH (ref 70–99)
Glucose-Capillary: 158 mg/dL — ABNORMAL HIGH (ref 70–99)
Glucose-Capillary: 124 mg/dL — ABNORMAL HIGH (ref 70–99)

## 2023-06-02 LAB — CBC
HCT: 27.1 % — ABNORMAL LOW (ref 36.0–46.0)
Hemoglobin: 8.2 g/dL — ABNORMAL LOW (ref 12.0–15.0)
MCH: 26.9 pg (ref 26.0–34.0)
MCHC: 30.3 g/dL (ref 30.0–36.0)
MCV: 88.9 fL (ref 80.0–100.0)
Platelets: 241 10*3/uL (ref 150–400)
RBC: 3.05 MIL/uL — ABNORMAL LOW (ref 3.87–5.11)
RDW: 18.6 % — ABNORMAL HIGH (ref 11.5–15.5)
WBC: 11.1 10*3/uL — ABNORMAL HIGH (ref 4.0–10.5)
nRBC: 0 % (ref 0.0–0.2)

## 2023-06-02 LAB — MAGNESIUM: Magnesium: 2.2 mg/dL (ref 1.7–2.4)

## 2023-06-02 LAB — PHOSPHORUS: Phosphorus: 2.1 mg/dL — ABNORMAL LOW (ref 2.5–4.6)

## 2023-06-02 MED ORDER — ORAL CARE MOUTH RINSE: 15.0000 mL | OROMUCOSAL | Status: DC | PRN

## 2023-06-02 MED ORDER — ANASTROZOLE 1 MG PO TABS
1.0000 mg | ORAL_TABLET | Freq: Every day | ORAL | Status: DC
  Administered 2023-06-02 – 2023-06-05 (×4): 1 mg
  Filled 2023-06-02 (×4): qty 1

## 2023-06-02 MED ORDER — ORAL CARE MOUTH RINSE
15.0000 mL | OROMUCOSAL | Status: DC
  Administered 2023-06-02 – 2023-06-12 (×42): 15 mL via OROMUCOSAL

## 2023-06-02 MED ORDER — ALBUMIN HUMAN 25 % IV SOLN
25.0000 g | Freq: Four times a day (QID) | INTRAVENOUS | Status: AC
  Administered 2023-06-02 – 2023-06-03 (×4): 25 g via INTRAVENOUS
  Filled 2023-06-02 (×4): qty 100

## 2023-06-02 NOTE — Evaluation (Addendum)
 Modified Barium Swallow Study  Patient Details  Name: Carolyn Parrish MRN: [id number] Date of Birth: Mar 05, 1940  Today's Date: 06/02/2023  Modified Barium Swallow completed.  Full report located under Chart Review in the Imaging Section.  History of Present Illness Shirleymae D Flock is an 83 yo female presenting to ED from [facility] 4/23 with AMS and R gaze deviation. Admitted with status epilepticus in the setting of severe hypoglycemia. Ultimately intubated for airway protection, 4/23 and self-extubated 4/24. PMH includes prior CVA with residual weakness and deficits, dementia, functional decline, breast cancer s/p XRT and mastectomy 2020   Clinical Impression Pt completed a limited MBS secondary to gagging on barium. Oral phase appeared to be fairly functional, although she only took up to pureed textures. Pharyngeally she does have reduced movement of the hyolaryngeal complex, contributing to reduced laryngeal vestibule closure and epiglottic inversion. There is no epiglottic inversion with smaller, lighter boluses. These physiological changes, combined with impaired timing, result in fairly consistent penetration with thin (PAS 2) and nectar thick liquids (PAS 3) with mostly small volumes taken at a time. Penetration was consistently more transient (PAS 2), if it occurred at all, with purees and honey thick liquids, even with bigger boluses, as a result of more timely swallow and a little more epiglottic inversion. Pt had mostly mild pharyngeal residue except for straw sip of nectar which required multiple spontaneous swallows to try to clear her valleculae and pyriform sinuses, during which pt would re-penetrate residue. Given that pt could not be challenged well wtih larger boluses, would start with Dys 1 (puree) diet and honey thick liquids. SLP will f/u for hopeful progression.  Factors that may increase risk of adverse event in presence of aspiration Roderick Civatte & Jessy Morocco 2021): Limited  mobility;Dependence for feeding and/or oral hygiene;Presence of tubes (ETT, trach, NG, etc.);Reduced cognitive function  Swallow Evaluation Recommendations Recommendations: PO diet PO Diet Recommendation: Dysphagia 1 (Pureed);Moderately thick liquids (Level 3, honey thick) Liquid Administration via: Cup;Straw Medication Administration: Crushed with puree Supervision: Staff to assist with self-feeding;Full assist for feeding Swallowing strategies  : Minimize environmental distractions;Slow rate;Small bites/sips Postural changes: Position pt fully upright for meals Oral care recommendations: Oral care BID (2x/day) Caregiver Recommendations: Avoid jello, ice cream, thin soups, popsicles;Remove water  pitcher;Have oral suction available       Beth Brooke., M.A. CCC-SLP Acute Rehabilitation Services Office: [phone]  Secure chat preferred  06/02/2023,12:27 PM

## 2023-06-02 NOTE — Progress Notes (Signed)
 PHARMACY - PHYSICIAN COMMUNICATION CRITICAL VALUE ALERT - BLOOD CULTURE IDENTIFICATION (BCID)  Carolyn Parrish is an 83 y.o. female who presented to [facility] on 05/30/2023 with a chief complaint of AMS.  Assessment:  Pt found to be hypoglycemic with resultant sz activity, intubated for airway protection; yesterday WBC noted to be 25.4 w/ hypotension, UA abnormal and started on ABX for sepsis d/t presumed UTI, now growing E.coli in 1 of 3 blood cx bottles.  Name of physician (or Provider) Contacted: CJEllison MD  Current antibiotics: ceftriaxone   Changes to prescribed antibiotics recommended:  Patient is on recommended antibiotics - No changes needed.  Results for orders placed or performed during the hospital encounter of 05/30/23  Blood Culture ID Panel (Reflexed) (Collected: 06/01/2023  9:38 AM)  Result Value Ref Range   Enterococcus faecalis NOT DETECTED NOT DETECTED   Enterococcus Faecium NOT DETECTED NOT DETECTED   Listeria monocytogenes NOT DETECTED NOT DETECTED   Staphylococcus species NOT DETECTED NOT DETECTED   Staphylococcus aureus (BCID) NOT DETECTED NOT DETECTED   Staphylococcus epidermidis NOT DETECTED NOT DETECTED   Staphylococcus lugdunensis NOT DETECTED NOT DETECTED   Streptococcus species NOT DETECTED NOT DETECTED   Streptococcus agalactiae NOT DETECTED NOT DETECTED   Streptococcus pneumoniae NOT DETECTED NOT DETECTED   Streptococcus pyogenes NOT DETECTED NOT DETECTED   A.calcoaceticus-baumannii NOT DETECTED NOT DETECTED   Bacteroides fragilis NOT DETECTED NOT DETECTED   Enterobacterales DETECTED (A) NOT DETECTED   Enterobacter cloacae complex NOT DETECTED NOT DETECTED   Escherichia coli DETECTED (A) NOT DETECTED   Klebsiella aerogenes NOT DETECTED NOT DETECTED   Klebsiella oxytoca NOT DETECTED NOT DETECTED   Klebsiella pneumoniae NOT DETECTED NOT DETECTED   Proteus species NOT DETECTED NOT DETECTED   Salmonella species NOT DETECTED NOT DETECTED   Serratia  marcescens NOT DETECTED NOT DETECTED   Haemophilus influenzae NOT DETECTED NOT DETECTED   Neisseria meningitidis NOT DETECTED NOT DETECTED   Pseudomonas aeruginosa NOT DETECTED NOT DETECTED   Stenotrophomonas maltophilia NOT DETECTED NOT DETECTED   Candida albicans NOT DETECTED NOT DETECTED   Candida auris NOT DETECTED NOT DETECTED   Candida glabrata NOT DETECTED NOT DETECTED   Candida krusei NOT DETECTED NOT DETECTED   Candida parapsilosis NOT DETECTED NOT DETECTED   Candida tropicalis NOT DETECTED NOT DETECTED   Cryptococcus neoformans/gattii NOT DETECTED NOT DETECTED   CTX-M ESBL NOT DETECTED NOT DETECTED   Carbapenem resistance IMP NOT DETECTED NOT DETECTED   Carbapenem resistance KPC NOT DETECTED NOT DETECTED   Carbapenem resistance NDM NOT DETECTED NOT DETECTED   Carbapenem resist OXA 48 LIKE NOT DETECTED NOT DETECTED   Carbapenem resistance VIM NOT DETECTED NOT DETECTED    Lonnie Roberts, PharmD, BCPS  06/02/2023  5:08 AM

## 2023-06-02 NOTE — Evaluation (Addendum)
 Physical Therapy Evaluation & Discharge Patient Details Name: Carolyn Parrish MRN: [id number] DOB: 02-07-1940 Today's Date: 06/02/2023  History of Present Illness  Pt is an 83 y.o. female who presented 05/30/23 with AMS and R gaze. Witnessed seizure en route to ED. MRI showed no acute intracranial abnormality. Pt admitted with hypoglycemia provoked seizure and sepsis secondary to UTI. Cortrak placed 4/25. ETT 4/23 - 4/24. PMH: prior CVA with residual weakness and deficits, dementia, functional decline, breast CA s/p XRT and mastectomy 2020, CHF, DM, HLD, HTN, schizoaffective disorder   Clinical Impression  Pt presents with condition above. PTA, she was dependent on staff for sitting up EOB and for hoyer lift transfers OOB to a recliner (no longer using w/c). She could assist with rolling. She is a long-term resident at [facility]. Currently, pt seems to be at her baseline, needing total assist to transition supine <> sit EOB and min-modA with moments of CGA for sitting balance at EOB. She demonstrates deficits in cognition (hx of cognitive deficits at baseline), generalized strength, balance, and activity tolerance. As pt appears to be at her baseline, no acute PT needs identified. However, she could benefit from PT at her SNF to work on getting OOB more frequently to improve her pulmonary function, mood, and core strength as there is potential to improve her sitting balance. All education completed and questions answered. Acute PT will sign off and defer further PT to her SNF. Family in agreement.       If plan is discharge home, recommend the following: Two people to help with walking and/or transfers;Two people to help with bathing/dressing/bathroom;Assistance with cooking/housework;Assistance with feeding;Direct supervision/assist for medications management;Direct supervision/assist for financial management;Assist for transportation;Help with stairs or ramp for entrance   Can travel by private  vehicle   No    Equipment Recommendations None recommended by PT  Recommendations for Other Services       Functional Status Assessment Patient has not had a recent decline in their functional status     Precautions / Restrictions Precautions Precautions: Fall Recall of Precautions/Restrictions: Impaired Precaution/Restrictions Comments: cortrak Restrictions Weight Bearing Restrictions Per Provider Order: No      Mobility  Bed Mobility Overal bed mobility: Needs Assistance Bed Mobility: Supine to Sit, Sit to Supine     Supine to sit: Total assist, HOB elevated Sit to supine: Total assist, HOB elevated   General bed mobility comments: Cues provided for pt to move each leg off L EOB with pt assisting some but <25%, needing total assist to complete trunk ascension, rotation of body, and leg  management to transition supine to sit L EOB. Total assist needed to transition back to supine.    Transfers                   General transfer comment: deferred, uses hoyer lift for OOB transfers at baseline    Ambulation/Gait               General Gait Details: deferred, does not ambulate at baseline  Stairs            Wheelchair Mobility     Tilt Bed    Modified Rankin (Stroke Patients Only) Modified Rankin (Stroke Patients Only) Pre-Morbid Rankin Score: Severe disability Modified Rankin: Severe disability     Balance Overall balance assessment: Needs assistance Sitting-balance support: No upper extremity supported, Feet supported Sitting balance-Leahy Scale: Poor Sitting balance - Comments: Posterior lean, initially needing modA but pt able to progress  to minA with brief CGA when cued to reach forward towards targets like her toes or therapist's hand.       Standing balance comment: deferred, does not stand at baseline                             Pertinent Vitals/Pain Pain Assessment Pain Assessment: Faces Faces Pain Scale:  Hurts a little bit Pain Location: generalized with mobility Pain Descriptors / Indicators: Grimacing, Discomfort Pain Intervention(s): Limited activity within patient's tolerance, Monitored during session, Repositioned    Home Living Family/patient expects to be discharged to:: Skilled nursing facility [facility] - long term resident)                        Prior Function Prior Level of Function : Needs assist (CVA in 1996, maybe L residual weakness?, mild; after 2015 she became less mobile after a surgery; 2019 became completely dependent after masectomy)             Mobility Comments: Hoyer lift transfers OOB; does not get OOB often and does not use w/c much anymore, primarily only goes to a recliner if OOB; pt can reach to assist in rolling but needs assistance to complete; Heavy assist for supine to sit and sitting balance ADLs Comments: bed baths; can hold sippy cup but needs assistance to use utensils; can feed herself finger foods; needs assistance for meds     Extremity/Trunk Assessment   Upper Extremity Assessment Upper Extremity Assessment: Defer to OT evaluation;Generalized weakness (seemed fairly symmetrically weak but able to lift both against gravity)    Lower Extremity Assessment Lower Extremity Assessment: Generalized weakness (seemed fairly symmetrically weak but able to lift both knees against gravity and wiggle toes; reports able to detect touch bil)    Cervical / Trunk Assessment Cervical / Trunk Assessment: Kyphotic  Communication   Communication Communication: Impaired Factors Affecting Communication: Reduced clarity of speech;Difficulty expressing self (family reports baseline pt has difficulty with clarity of speech)    Cognition Arousal: Alert Behavior During Therapy: WFL for tasks assessed/performed   PT - Cognitive impairments: History of cognitive impairments                       PT - Cognition Comments: Per chart, pt has hx  of dementia but able to recall her name, her kid's names, and that she is currently in the hospital. Slow to process cues but follows simple, single-step commands. Decreased awareness of her directional lean and need to correct it. Following commands: Impaired Following commands impaired: Follows one step commands with increased time     Cueing Cueing Techniques: Verbal cues, Tactile cues, Gestural cues     General Comments General comments (skin integrity, edema, etc.): VSS; daughter, son, and family friend present and supportive throughout    Exercises     Assessment/Plan    PT Assessment All further PT needs can be met in the next venue of care  PT Problem List Decreased strength;Decreased activity tolerance;Decreased balance;Decreased mobility;Decreased cognition       PT Treatment Interventions      PT Goals (Current goals can be found in the Care Plan section)  Acute Rehab PT Goals Patient Stated Goal: did not state; family wishes for pt to get PT at the SNF PT Goal Formulation: All assessment and education complete, DC therapy Time For Goal Achievement: 06/03/23 Potential to Achieve Goals:  Fair    Frequency       Co-evaluation               AM-PAC PT "6 Clicks" Mobility  Outcome Measure Help needed turning from your back to your side while in a flat bed without using bedrails?: Total Help needed moving from lying on your back to sitting on the side of a flat bed without using bedrails?: Total Help needed moving to and from a bed to a chair (including a wheelchair)?: Total Help needed standing up from a chair using your arms (e.g., wheelchair or bedside chair)?: Total Help needed to walk in hospital room?: Total Help needed climbing 3-5 steps with a railing? : Total 6 Click Score: 6    End of Session   Activity Tolerance: Patient tolerated treatment well Patient left: in bed;with call bell/phone within reach;with family/visitor present;with nursing/sitter  in room (no alarm as nursing actively cleaning pt) Nurse Communication: Mobility status;Other (comment) (no alarm as nursing actively cleaning pt due to pt's BM) PT Visit Diagnosis: Muscle weakness (generalized) (M62.81)    Time: 6578-4696 PT Time Calculation (min) (ACUTE ONLY): 30 min   Charges:   PT Evaluation $PT Eval Moderate Complexity: 1 Mod PT Treatments $Therapeutic Activity: 8-22 mins PT General Charges $$ ACUTE PT VISIT: 1 Visit         Vernida Goodie, PT, DPT Acute Rehabilitation Services  Office: [phone]   Ellyn Hack 06/02/2023, 4:02 PM

## 2023-06-02 NOTE — Progress Notes (Signed)
 NAME:  Carolyn Parrish, MRN:  [id number], DOB:  December 30, 1940, LOS: 3 ADMISSION DATE:  05/30/2023, CONSULTATION DATE:  05/30/23 REFERRING MD:  Drury Geralds CHIEF COMPLAINT:  AMS   History of Present Illness:  Pt is encephelopathic; therefore, this HPI is obtained from chart review.  Carolyn Parrish is a 83 y.o. female who has a PMH as below including but not limited to prior CVA with residual weakness and deficits, dementia, functional decline, breast CA s/p XRT and mastectomy 2020. She resides at [facility] She was in her usual state of health 4/22 but that night suffered a fall followed by some "behavioral changes" that were not normal.  On morning of 4/23, she remained altered and had a right gaze deviation prompting EMS to be called.  She was brought to [facility] ED as code stroke; however, seizures noted on arrival and CBG was 17. She was given an amp of D50 with resolution of seizures. CT head was negative. She remained altered and possibly post ictal with concern for airway protection. DNR form noted; however, per discussions with daughter, this was primarily in regards to chest compressions/defib and she was ok with intubation; therefore, she was intubated for airway protection.  Neurology was consulted and suspected AMS was secondary to seizures in setting of profound hypoglycemia.  Pertinent  Medical History:  has Malignant neoplasm of upper-inner quadrant of left breast in female, estrogen receptor positive (HCC); Encounter for central line placement; History of stroke; DM (diabetes mellitus), type 2 with peripheral vascular complications (HCC); Schizoaffective disorder (HCC); Breast cancer, stage 2, left (HCC); Sepsis (HCC); Septic shock (HCC); AKI (acute kidney injury) (HCC); Lactic acidosis; Bipolar disorder (HCC); History of completed stroke; Dehydration; Acute encephalopathy; Acute hypernatremia; Vascular dementia (HCC); Essential hypertension; Hypertensive heart disease with heart failure  (HCC); Dyslipidemia; Normochromic normocytic anemia; Vitamin D  deficiency; Adult failure to thrive; Seizure (HCC); and Protein-calorie malnutrition, severe on their problem list.   Significant Hospital Events: Including procedures, antibiotic start and stop dates in addition to other pertinent events   4/23 admit as code stroke evaluation on admission revealed seizure activity in the setting of severe hypoglycemia.   4/24 self extubated late afternoon 4/25 remains on peripheral pressors with low-grade fever and worsening leukocytosis.  UA with many bacteria, nitrates, and leukocytes 4/26 blood cultures positive for E. coli, remains pressor dependent  Interim History / Subjective:  Response to verbal stimuli, able to state name  Objective:  Blood pressure (!) 128/52, pulse 63, temperature 98.7 F (37.1 C), temperature source Axillary, resp. rate 20, height 5\' 6"  (1.676 m), weight 67.9 kg, SpO2 99%.        Intake/Output Summary (Last 24 hours) at 06/02/2023 0841 Last data filed at 06/02/2023 0800 Gross per 24 hour  Intake 2285.32 ml  Output 1730 ml  Net 555.32 ml   Filed Weights   05/30/23 0800 06/01/23 0500 06/02/23 0433  Weight: 64.4 kg 65 kg 67.9 kg    Examination: General: Acute on chronic ill-appearing deconditioned elderly thin female lying in bed in no acute distress HEENT: [place]/AT, MM pink/moist, PERRL,  Neuro: Alert and oriented x 1, nonfocal CV: s1s2 regular rate and rhythm, no murmur, rubs, or gallops,  PULM: Clear to auscultation bilaterally, no increased work of breathing, no added breath sounds GI: soft, bowel sounds active in all 4 quadrants, non-tender, non-distended, tolerating TF Extremities: warm/dry, no edema  Skin: no rashes or lesions   Resolved problems  Acute respiratory insufficiency with inability to protect the airway  -  Self extubated afternoon of 4/24  Assessment & Plan:   New onset seizures suspected 2/2 hypoglycemia - Resolved  -Presented as  code stroke; however, seizures noted on arrival and CBG was 17. She was given an amp of D50 with resolution of seizures. CT and MRI head was negative.  Hx CVA, schizoaffective disorder P: Neuroprotective measures Delirium precautions Secondary stroke prevention Seizure precautions Minimize sedation as able  Sepsis with E. coli bacteremia in the setting of urinary tract infection, POA -initially admitted with normal vital signs, only mild leukocytosis and hypotension was felt secondary to sedation.  However a.m. 4/25 patient's leukocytosis has worsened mild fever and positive UA with many bacteria and nitrates and leukocytes.  P: Continue ceftriaxone  Follow susceptibilities Pressors for MAP goal greater than 65  Hypokalemia  Hypocalcemia Hypophosphatemia P: Supplement as needed  Essential hypertension Hyperlipidemia CHF per report (no echo available) -Echo within normal limits P: Continuous telemetry Optimize electrolytes Daily weight Goal of euvolemia  At risk malnutrition Hypoalbuminemia P: Continue tube feeds Optimize protein  Best practice (evaluated daily):  Diet/type: NPO - start tube feeds.  DVT prophylaxis: prophylactic heparin   Pressure ulcer(s): pressure ulcer assessment deferred  GI prophylaxis: PPI Lines: N/A Foley:  Yes, and it is still needed Code Status:  DNR Last date of multidisciplinary goals of care discussion: None yet.  CRITICAL CARE Performed by: Shawon Denzer D. Harris   Total critical care time: 37 minutes  Critical care time was exclusive of separately billable procedures and treating other patients.  Critical care was necessary to treat or prevent imminent or life-threatening deterioration.  Critical care was time spent personally by me on the following activities: development of treatment plan with patient and/or surrogate as well as nursing, discussions with consultants, evaluation of patient's response to treatment, examination of  patient, obtaining history from patient or surrogate, ordering and performing treatments and interventions, ordering and review of laboratory studies, ordering and review of radiographic studies, pulse oximetry and re-evaluation of patient's condition.  Levan Aloia D. Harris, NP-C [facility] Personal contact information can be found on Amion  If no contact or response made please call [phone] 06/02/2023, 8:41 AM

## 2023-06-02 NOTE — Progress Notes (Signed)
 eLink Physician-Brief Progress Note Patient Name: CIANA RZEPECKI DOB: 04/20/1940 MRN: [id number]   Date of Service  06/02/2023  HPI/Events of Note  Notified by pharmacy E.coli in 1 of 3 blood cx bottles   Currently on ceftriaxone   eICU Interventions  No antibiotic changes indicated     Intervention Category Minor Interventions: Communication with other healthcare providers and/or family  Erma Joubert Genetta Kenning 06/02/2023, 5:18 AM

## 2023-06-02 NOTE — Progress Notes (Signed)
 Afternoon rounds   Patient remains stable with decreasing pressors requirement this afternoon. Called and spoke to patients daughter Sherian Dimitri for daily update  Azzam Mehra D. Harris, NP-C [facility] Personal contact information can be found on Amion  If no contact or response made please call [phone] 06/02/2023, 3:45 PM

## 2023-06-03 DIAGNOSIS — R569 Unspecified convulsions: Secondary | ICD-10-CM | POA: Diagnosis not present

## 2023-06-03 DIAGNOSIS — A419 Sepsis, unspecified organism: Secondary | ICD-10-CM | POA: Diagnosis not present

## 2023-06-03 DIAGNOSIS — N390 Urinary tract infection, site not specified: Secondary | ICD-10-CM | POA: Diagnosis not present

## 2023-06-03 LAB — CBC
HCT: 27.8 % — ABNORMAL LOW (ref 36.0–46.0)
Hemoglobin: 8.5 g/dL — ABNORMAL LOW (ref 12.0–15.0)
MCH: 27 pg (ref 26.0–34.0)
MCHC: 30.6 g/dL (ref 30.0–36.0)
MCV: 88.3 fL (ref 80.0–100.0)
Platelets: 214 10*3/uL (ref 150–400)
RBC: 3.15 MIL/uL — ABNORMAL LOW (ref 3.87–5.11)
RDW: 18.9 % — ABNORMAL HIGH (ref 11.5–15.5)
WBC: 10 10*3/uL (ref 4.0–10.5)
nRBC: 0.3 % — ABNORMAL HIGH (ref 0.0–0.2)

## 2023-06-03 LAB — BASIC METABOLIC PANEL WITH GFR
Anion gap: 11 (ref 5–15)
BUN: 14 mg/dL (ref 8–23)
CO2: 25 mmol/L (ref 22–32)
Calcium: 8.8 mg/dL — ABNORMAL LOW (ref 8.9–10.3)
Chloride: 106 mmol/L (ref 98–111)
Creatinine, Ser: 0.39 mg/dL — ABNORMAL LOW (ref 0.44–1.00)
GFR, Estimated: >60 mL/min (ref >60)
Glucose, Bld: 151 mg/dL — ABNORMAL HIGH (ref 70–99)
Potassium: 3.3 mmol/L — ABNORMAL LOW (ref 3.5–5.1)
Sodium: 142 mmol/L (ref 135–145)

## 2023-06-03 LAB — GLUCOSE, CAPILLARY
Glucose-Capillary: 143 mg/dL — ABNORMAL HIGH (ref 70–99)
Glucose-Capillary: 132 mg/dL — ABNORMAL HIGH (ref 70–99)
Glucose-Capillary: 116 mg/dL — ABNORMAL HIGH (ref 70–99)
Glucose-Capillary: 137 mg/dL — ABNORMAL HIGH (ref 70–99)

## 2023-06-03 LAB — PHOSPHORUS: Phosphorus: 1.6 mg/dL — ABNORMAL LOW (ref 2.5–4.6)

## 2023-06-03 LAB — MAGNESIUM: Magnesium: 2.2 mg/dL (ref 1.7–2.4)

## 2023-06-03 MED ORDER — POTASSIUM CHLORIDE 20 MEQ PO PACK: 60.0000 meq | PACK | Freq: Once | ORAL | Status: DC

## 2023-06-03 MED ORDER — MIDODRINE HCL 5 MG PO TABS
5.0000 mg | ORAL_TABLET | Freq: Three times a day (TID) | ORAL | Status: DC
  Administered 2023-06-03 – 2023-06-05 (×5): 5 mg
  Filled 2023-06-03 (×5): qty 1

## 2023-06-03 MED ORDER — K PHOS MONO-SOD PHOS DI & MONO 155-852-130 MG PO TABS
500.0000 mg | ORAL_TABLET | Freq: Once | ORAL | Status: AC
  Administered 2023-06-03: 500 mg
  Filled 2023-06-03: qty 2

## 2023-06-03 MED ORDER — INFLUENZA VAC A&B SURF ANT ADJ 0.5 ML IM SUSY: 0.5000 mL | PREFILLED_SYRINGE | INTRAMUSCULAR | Status: DC

## 2023-06-03 MED ORDER — POTASSIUM CHLORIDE 20 MEQ PO PACK
60.0000 meq | PACK | Freq: Once | ORAL | Status: AC
  Administered 2023-06-03: 60 meq
  Filled 2023-06-03: qty 3

## 2023-06-03 NOTE — Progress Notes (Signed)
 Pharmacy Electrolyte Replacement  Recent Labs:  Recent Labs    06/03/23 0432  K 3.3*  MG 2.2  PHOS 1.6*  CREATININE 0.39*    Low Critical Values (K </= 2.5, Phos </= 1, Mg </= 1) Present: None  MD Contacted: Deneise Finlay, PA  Plan: KCl 60 mEq ordered per PA KPhos Neutral 500 mg per tube x1  Caroline Cinnamon, PharmD, BCPS Clinical Pharmacist Clinical phone for 06/03/2023 is [phone] 06/03/2023 11:14 AM

## 2023-06-03 NOTE — Progress Notes (Signed)
 Contacted Deneise Finlay NP regarding pt's swollen, red, and warm left upper extremity. US  was ordered.

## 2023-06-03 NOTE — Progress Notes (Signed)
 NAME:  Carolyn Parrish, MRN:  [id number], DOB:  10-Jan-1941, LOS: 4 ADMISSION DATE:  05/30/2023, CONSULTATION DATE:  05/30/23 REFERRING MD:  Drury Geralds CHIEF COMPLAINT:  AMS   History of Present Illness:  Pt is encephelopathic; therefore, this HPI is obtained from chart review.  Carolyn Parrish is a 83 y.o. female who has a PMH as below including but not limited to prior CVA with residual weakness and deficits, dementia, functional decline, breast CA s/p XRT and mastectomy 2020. She resides at [facility]. She was in her usual state of health 4/22 but that night suffered a fall followed by some "behavioral changes" that were not normal.  On morning of 4/23, she remained altered and had a right gaze deviation prompting EMS to be called.  She was brought to [facility] ED as code stroke; however, seizures noted on arrival and CBG was 17. She was given an amp of D50 with resolution of seizures. CT head was negative. She remained altered and possibly post ictal with concern for airway protection. DNR form noted; however, per discussions with daughter, this was primarily in regards to chest compressions/defib and she was ok with intubation; therefore, she was intubated for airway protection.  Neurology was consulted and suspected AMS was secondary to seizures in setting of profound hypoglycemia.  Pertinent  Medical History:  has Malignant neoplasm of upper-inner quadrant of left breast in female, estrogen receptor positive (HCC); Encounter for central line placement; History of stroke; DM (diabetes mellitus), type 2 with peripheral vascular complications (HCC); Schizoaffective disorder (HCC); Breast cancer, stage 2, left (HCC); Sepsis (HCC); Septic shock (HCC); AKI (acute kidney injury) (HCC); Lactic acidosis; Bipolar disorder (HCC); History of completed stroke; Dehydration; Acute encephalopathy; Acute hypernatremia; Vascular dementia (HCC); Essential hypertension; Hypertensive heart disease with heart failure  (HCC); Dyslipidemia; Normochromic normocytic anemia; Vitamin D  deficiency; Adult failure to thrive; Seizure (HCC); and Protein-calorie malnutrition, severe on their problem list.   Significant Hospital Events: Including procedures, antibiotic start and stop dates in addition to other pertinent events   4/23 admit as code stroke evaluation on admission revealed seizure activity in the setting of severe hypoglycemia.   4/24 self extubated late afternoon 4/25 remains on peripheral pressors with low-grade fever and worsening leukocytosis.  UA with many bacteria, nitrates, and leukocytes 4/26 blood cultures positive for E. coli, remains pressor dependent 4/27 no acute issues overnight, now off vasopressor support  Interim History / Subjective:  More alert and interactive this a.m., ate about 75% of breakfast.  Objective:  Blood pressure (!) 108/55, pulse 72, temperature 97.8 F (36.6 C), temperature source Axillary, resp. rate (!) 28, height 5\' 6"  (1.676 m), weight 66 kg, SpO2 100%.        Intake/Output Summary (Last 24 hours) at 06/03/2023 0841 Last data filed at 06/03/2023 0800 Gross per 24 hour  Intake 3001.42 ml  Output 1700 ml  Net 1301.42 ml   Filed Weights   06/01/23 0500 06/02/23 0433 06/03/23 0500  Weight: 65 kg 67.9 kg 66 kg    Examination: General: Acute on chronic ill-appearing elderly female lying in bed on in no acute distress HEENT: [place]/AT, MM pink/moist, PERRL,  Neuro: Alert and oriented x 3, nonfocal CV: s1s2 regular rate and rhythm, no murmur, rubs, or gallops,  PULM: Clear to auscultation bilaterally, no increased work of breathing, no added breath sounds GI: soft, bowel sounds active in all 4 quadrants, non-tender, non-distended, tolerating TF oral diet Extremities: warm/dry, and no edema  Skin: no rashes or  lesions  Resolved problems  Acute respiratory insufficiency with inability to protect the airway  - Self extubated afternoon of 4/24  Assessment & Plan:    New onset seizures suspected 2/2 hypoglycemia - Resolved  -Presented as code stroke; however, seizures noted on arrival and CBG was 17. She was given an amp of D50 with resolution of seizures. CT and MRI head was negative.  Hx CVA, schizoaffective disorder P: Neuroprotective measures Delirium precautions Secondary stroke prevention Sedation as able  Sepsis with E. coli bacteremia in the setting of urinary tract infection, POA -initially admitted with normal vital signs, only mild leukocytosis and hypotension was felt secondary to sedation.  However a.m. 4/25 patient's leukocytosis has worsened mild fever and positive UA with many bacteria and nitrates and leukocytes.  P: Continue ceftriaxone  Follow susceptibilities  Hypokalemia  Hypocalcemia Hypophosphatemia P: Supplement as needed  Essential hypertension Hyperlipidemia CHF per report (no echo available) -Echo within normal limits P: Continuous telemetry Optimize electrolytes Daily weight Goal of euvolemia  At risk malnutrition Hypoalbuminemia P: Continue tube feeds Optimize protein Consider calorie count within the next few days to assess ongoing need of tube feed  Stable for transfer out of the ICU this afternoon, to TRH starting/28  Best practice (evaluated daily):  Diet/type: NPO - start tube feeds.  DVT prophylaxis: prophylactic heparin   Pressure ulcer(s): pressure ulcer assessment deferred  GI prophylaxis: PPI Lines: N/A Foley:  Yes, and it is still needed Code Status:  DNR Last date of multidisciplinary goals of care discussion: None yet.   Olanrewaju Osborn D. Harris, NP-C [facility] Personal contact information can be found on Amion  If no contact or response made please call [phone] 06/03/2023, 8:41 AM

## 2023-06-04 ENCOUNTER — Inpatient Hospital Stay (HOSPITAL_COMMUNITY)

## 2023-06-04 DIAGNOSIS — E43 Unspecified severe protein-calorie malnutrition: Secondary | ICD-10-CM

## 2023-06-04 DIAGNOSIS — N390 Urinary tract infection, site not specified: Secondary | ICD-10-CM

## 2023-06-04 DIAGNOSIS — A419 Sepsis, unspecified organism: Secondary | ICD-10-CM

## 2023-06-04 DIAGNOSIS — R569 Unspecified convulsions: Secondary | ICD-10-CM | POA: Diagnosis not present

## 2023-06-04 DIAGNOSIS — E876 Hypokalemia: Secondary | ICD-10-CM | POA: Diagnosis not present

## 2023-06-04 DIAGNOSIS — J9601 Acute respiratory failure with hypoxia: Secondary | ICD-10-CM

## 2023-06-04 DIAGNOSIS — E162 Hypoglycemia, unspecified: Secondary | ICD-10-CM | POA: Diagnosis not present

## 2023-06-04 DIAGNOSIS — M7989 Other specified soft tissue disorders: Secondary | ICD-10-CM

## 2023-06-04 LAB — CBC
HCT: 30.6 % — ABNORMAL LOW (ref 36.0–46.0)
Hemoglobin: 9.3 g/dL — ABNORMAL LOW (ref 12.0–15.0)
MCH: 27.2 pg (ref 26.0–34.0)
MCHC: 30.4 g/dL (ref 30.0–36.0)
MCV: 89.5 fL (ref 80.0–100.0)
Platelets: 258 10*3/uL (ref 150–400)
RBC: 3.42 MIL/uL — ABNORMAL LOW (ref 3.87–5.11)
RDW: 19.1 % — ABNORMAL HIGH (ref 11.5–15.5)
WBC: 9.6 10*3/uL (ref 4.0–10.5)
nRBC: 0 % (ref 0.0–0.2)

## 2023-06-04 LAB — BASIC METABOLIC PANEL WITH GFR
Anion gap: 11 (ref 5–15)
BUN: 18 mg/dL (ref 8–23)
CO2: 26 mmol/L (ref 22–32)
Calcium: 9.3 mg/dL (ref 8.9–10.3)
Chloride: 107 mmol/L (ref 98–111)
Creatinine, Ser: 0.43 mg/dL — ABNORMAL LOW (ref 0.44–1.00)
GFR, Estimated: >60 mL/min (ref >60)
Glucose, Bld: 118 mg/dL — ABNORMAL HIGH (ref 70–99)
Potassium: 3.4 mmol/L — ABNORMAL LOW (ref 3.5–5.1)
Sodium: 144 mmol/L (ref 135–145)

## 2023-06-04 LAB — PHOSPHORUS: Phosphorus: 1.2 mg/dL — ABNORMAL LOW (ref 2.5–4.6)

## 2023-06-04 LAB — GLUCOSE, CAPILLARY
Glucose-Capillary: 125 mg/dL — ABNORMAL HIGH (ref 70–99)
Glucose-Capillary: 126 mg/dL — ABNORMAL HIGH (ref 70–99)
Glucose-Capillary: 127 mg/dL — ABNORMAL HIGH (ref 70–99)
Glucose-Capillary: 128 mg/dL — ABNORMAL HIGH (ref 70–99)
Glucose-Capillary: 128 mg/dL — ABNORMAL HIGH (ref 70–99)
Glucose-Capillary: 139 mg/dL — ABNORMAL HIGH (ref 70–99)
Glucose-Capillary: 150 mg/dL — ABNORMAL HIGH (ref 70–99)
Glucose-Capillary: 89 mg/dL (ref 70–99)

## 2023-06-04 LAB — MAGNESIUM: Magnesium: 2.2 mg/dL (ref 1.7–2.4)

## 2023-06-04 MED ORDER — POTASSIUM PHOSPHATES 15 MMOLE/5ML IV SOLN
15.0000 mmol | Freq: Once | INTRAVENOUS | Status: AC
  Administered 2023-06-04: 15 mmol via INTRAVENOUS
  Filled 2023-06-04: qty 5

## 2023-06-04 MED ORDER — POTASSIUM & SODIUM PHOSPHATES 280-160-250 MG PO PACK
1.0000 | PACK | Freq: Three times a day (TID) | ORAL | Status: DC
  Administered 2023-06-04 – 2023-06-05 (×3): 1 via ORAL
  Filled 2023-06-04 (×4): qty 1

## 2023-06-04 MED ORDER — OSMOLITE 1.5 CAL PO LIQD
1000.0000 mL | Freq: Every day | ORAL | Status: DC
  Administered 2023-06-04 – 2023-06-11 (×8): 1000 mL
  Filled 2023-06-04 (×9): qty 1000

## 2023-06-04 NOTE — TOC Initial Note (Signed)
 Transition of Care [facility]) - Initial/Assessment Note    Patient Details  Name: Carolyn Parrish MRN: [id number] Date of Birth: [birth year]  [facility] of [facility]) CM/SW Contact:    Carmon Christen, LCSWA Phone Number: 06/04/2023, 2:15 PM  Clinical Narrative:                  CSW received consult for possible SNF placement at time of discharge. Due to patients current orientation CSW spoke with patients daughter Blaine Bump regarding PT recommendation of SNF placement at time of discharge. Patients daughter reports PTA patient comes from [facility] L[facility].Patients daughter expressed understanding of PT recommendation and is agreeable for patient to receive some short term rehab when patient returns to their LTC facility. CSW discussed insurance authorization process.No further questions reported at this time. CSW to continue to follow and assist with discharge planning needs.    Expected Discharge Plan: Skilled Nursing Facility Barriers to Discharge: Continued Medical Work up   Patient Goals and CMS Choice     Choice offered to / list presented to : Adult Children (Patients daughter)      Expected Discharge Plan and Services In-house Referral: Clinical Social Work   Post Acute Care Choice: Skilled Nursing Facility Living arrangements for the past 2 months: Skilled Nursing Facility                                      Prior Living Arrangements/Services Living arrangements for the past 2 months: Skilled Nursing Facility Lives with:: Facility Resident Patient language and need for interpreter reviewed:: Yes Do you feel safe going back to the place where you live?: Yes      Need for Family Participation in Patient Care: Yes (Comment) Care giver support system in place?: Yes (comment)   Criminal Activity/Legal Involvement Pertinent to Current Situation/Hospitalization: No - Comment as needed  Activities of Daily Living   ADL Screening (condition at time of  admission) Independently performs ADLs?: No Does the patient have a NEW difficulty with bathing/dressing/toileting/self-feeding that is expected to last >3 days?: Yes (Initiates electronic notice to provider for possible OT consult) Does the patient have a NEW difficulty with getting in/out of bed, walking, or climbing stairs that is expected to last >3 days?: Yes (Initiates electronic notice to provider for possible PT consult) Does the patient have a NEW difficulty with communication that is expected to last >3 days?: Yes (Initiates electronic notice to provider for possible SLP consult) Is the patient deaf or have difficulty hearing?: No Does the patient have difficulty seeing, even when wearing glasses/contacts?: No Does the patient have difficulty concentrating, remembering, or making decisions?: Yes  Permission Sought/Granted Permission sought to share information with : Case Manager, Family Supports, Oceanographer granted to share information with : No     Permission granted to share info w AGENCY: [facility]        Emotional Assessment Appearance:: Appears stated age Attitude/Demeanor/Rapport: Unable to Assess Affect (typically observed): Unable to Assess Orientation: : Oriented to Self, Oriented to Place Alcohol / Substance Use: Not Applicable Psych Involvement: No (comment)  Admission diagnosis:  Seizure (HCC) [R56.9] Hypoglycemia [E16.2] Acute respiratory failure with hypoxia (HCC) [J96.01] Patient Active Problem List   Diagnosis Date Noted   Protein-calorie malnutrition, severe 05/31/2023   Seizure (HCC) 05/30/2023   Adult failure to thrive 09/07/2020   Dyslipidemia 01/23/2019   Normochromic normocytic anemia 01/23/2019  Vitamin D  deficiency 01/23/2019   Hypertensive heart disease with heart failure (HCC) 10/17/2018   Essential hypertension 08/01/2018   Vascular dementia (HCC) 01/22/2018   Acute hypernatremia 01/09/2018   Acute  encephalopathy 01/08/2018   Dehydration    Sepsis (HCC) 11/14/2017   Septic shock (HCC) 11/14/2017   AKI (acute kidney injury) (HCC)    Lactic acidosis    Bipolar disorder (HCC)    History of completed stroke    Breast cancer, stage 2, left (HCC) 10/30/2017   Encounter for central line placement 09/12/2017   History of stroke 09/12/2017   DM (diabetes mellitus), type 2 with peripheral vascular complications (HCC) 09/12/2017   Schizoaffective disorder (HCC) 09/12/2017   Malignant neoplasm of upper-inner quadrant of left breast in female, estrogen receptor positive (HCC) 09/07/2017   PCP:  Pcp, No Pharmacy:  No Pharmacies Listed    Social Drivers of Health (SDOH) Social History: SDOH Screenings   Food Insecurity: Patient Unable To Answer (06/01/2023)  Housing: Patient Unable To Answer (06/01/2023)  Transportation Needs: Patient Unable To Answer (06/01/2023)  Utilities: Patient Unable To Answer (06/01/2023)  Depression (PHQ2-9): Medium Risk (12/03/2018)  Financial Resource Strain: Low Risk  (01/15/2018)  Physical Activity: Inactive (01/15/2018)  Social Connections: Unknown (06/01/2023)  Stress: Stress Concern Present (01/15/2018)  Tobacco Use: Low Risk  (05/30/2023)   SDOH Interventions:     Readmission Risk Interventions     No data to display

## 2023-06-04 NOTE — Plan of Care (Signed)

## 2023-06-04 NOTE — Plan of Care (Addendum)
 The patient remains on MC-6E at time of writing. Enteral feeds via Cortrak. Rectal pouch and external urine catheter for incontinence management. Febril overnight with Yellow MEWS; E-Link team notified (see additional note). Plan for LUE U/S soon.   Edit: 0981 hours (06/04/23): Patient declined AM labs; Dr. Butch Cashing notified.   Problem: Education: Goal: Knowledge of General Education information will improve Description: Including pain rating scale, medication(s)/side effects and non-pharmacologic comfort measures Outcome: Progressing   Problem: Health Behavior/Discharge Planning: Goal: Ability to manage health-related needs will improve Outcome: Progressing   Problem: Clinical Measurements: Goal: Ability to maintain clinical measurements within normal limits will improve Outcome: Progressing Goal: Will remain free from infection Outcome: Progressing Goal: Diagnostic test results will improve Outcome: Progressing Goal: Respiratory complications will improve Outcome: Progressing Goal: Cardiovascular complication will be avoided Outcome: Progressing   Problem: Activity: Goal: Risk for activity intolerance will decrease Outcome: Progressing   Problem: Nutrition: Goal: Adequate nutrition will be maintained Outcome: Progressing   Problem: Coping: Goal: Level of anxiety will decrease Outcome: Progressing   Problem: Elimination: Goal: Will not experience complications related to bowel motility Outcome: Progressing Goal: Will not experience complications related to urinary retention Outcome: Progressing   Problem: Pain Managment: Goal: General experience of comfort will improve and/or be controlled Outcome: Progressing   Problem: Safety: Goal: Ability to remain free from injury will improve Outcome: Progressing   Problem: Skin Integrity: Goal: Risk for impaired skin integrity will decrease Outcome: Progressing

## 2023-06-04 NOTE — Progress Notes (Signed)
 Speech Language Pathology Treatment: Dysphagia  Patient Details Name: Carolyn Parrish MRN: [id number] DOB: [birth year] Today's Date: 06/04/2023 Time: 1208-1221 SLP Time Calculation (min) (ACUTE ONLY): 13 min  Assessment / Plan / Recommendation Clinical Impression  Pt presents with improvements related to mentation and vocal quality today. She appeared more oriented and was able to participate in conversations functionally. No coughing was noted with honey thick liquids or consecutive sips of thin liquids. She masticated solids effectively and achieved complete oral clearance. Given reduced tolerance of barium during MBS, it is not ideal to repeat as results were so limited but she presents with clinical improvements that promote diet advancement. Recommend upgrading to pt's baseline diet of Dys 3 solids and thin liquids. SLP will continue following.    HPI HPI: Annleigh D Erno is an 83 yo female presenting to ED from [facility] 4/23 with AMS and R gaze deviation. Admitted with status epilepticus in the setting of severe hypoglycemia. Ultimately intubated for airway protection, 4/23 and self-extubated 4/24. PMH includes prior CVA with residual weakness and deficits, dementia, functional decline, breast cancer s/p XRT and mastectomy 2020      SLP Plan  Continue with current plan of care      Recommendations for follow up therapy are one component of a multi-disciplinary discharge planning process, led by the attending physician.  Recommendations may be updated based on patient status, additional functional criteria and insurance authorization.    Recommendations  Diet recommendations: Dysphagia 3 (mechanical soft);Thin liquid Liquids provided via: Cup;Straw Medication Administration: Crushed with puree Supervision: Staff to assist with self feeding;Full supervision/cueing for compensatory strategies Compensations: Minimize environmental distractions;Slow rate;Small sips/bites Postural  Changes and/or Swallow Maneuvers: Seated upright 90 degrees                  Oral care BID   Frequent or constant Supervision/Assistance Dysphagia, pharyngeal phase (R13.13)     Continue with current plan of care     Amil Kale, M.A., CCC-SLP Speech Language Pathology, Acute Rehabilitation Services  Secure Chat preferred [phone]   06/04/2023, 1:03 PM

## 2023-06-04 NOTE — Progress Notes (Signed)
   06/03/23 2145  Assess: MEWS Score  Temp 100.2 F (37.9 C)  BP (!) 124/47  MAP (mmHg) (!) 63  Pulse Rate 91  ECG Heart Rate 91  Resp (!) 30  Level of Consciousness Alert  SpO2 100 %  O2 Device Room Air  Assess: MEWS Score  MEWS Temp 0  MEWS Systolic 0  MEWS Pulse 0  MEWS RR 2  MEWS LOC 0  MEWS Score 2  MEWS Score Color Yellow  Assess: if the MEWS score is Yellow or Red  Were vital signs accurate and taken at a resting state? Yes  Does the patient meet 2 or more of the SIRS criteria? Yes  Does the patient have a confirmed or suspected source of infection? Yes  MEWS guidelines implemented  Yes, yellow  Treat  MEWS Interventions Considered administering scheduled or prn medications/treatments as ordered  Take Vital Signs  Increase Vital Sign Frequency  Yellow: Q2hr x1, continue Q4hrs until patient remains green for 12hrs  Escalate  MEWS: Escalate Yellow: Discuss with charge nurse and consider notifying provider and/or RRT  Notify: Charge Nurse/RN  Name of Charge Nurse/RN Notified Barkley Li, RN  Provider Notification  Provider Name/Title E-Link RN, John  Date Provider Notified 06/03/23  Time Provider Notified 2150  Method of Notification Call  Notification Reason Other (Comment) (MEWS)  Provider response No new orders  Date of Provider Response 06/03/23  Time of Provider Response 2150  Assess: SIRS CRITERIA  SIRS Temperature  0  SIRS Respirations  1  SIRS Pulse 1  SIRS WBC 0  SIRS Score Sum  2

## 2023-06-04 NOTE — Progress Notes (Signed)
 Progress Note   Patient: TANISHIA MACHIDA ZOX:[id number] DOB: [birth year] DOA: 05/30/2023     5 DOS: the patient was seen and examined on 06/04/2023   Brief hospital course: Grant D Bratten is a 83 y.o. female who has a PMH as below including but not limited to prior CVA with residual weakness and deficits, dementia, functional decline, breast CA s/p XRT and mastectomy 2020. She resides at  [facility].   Patient is admitted to ICU as code stroke on admission, revealed seizure activity in the setting of severe hyperglycemia.  Patient self extubated 4/24.  Patient had low-grade fever, worsening leukocytosis remained on IV pressors.  Urine cultures positive for E. coli, now off pressors transferred to TRH service.  Assessment and Plan: New onset seizure suspected secondary to hypoglycemia Continue neurochecks. Continue fall, seizure precautions. Monitor blood sugars, electrolytes closely.  Hypoglycemia: Continue to monitor blood sugars closely. Monitor oral intake and gradually wean off tube feeds. Patient's blood sugar stable so far. Hypoglycemia protocol.  Acute respiratory failure with inability to protect airway S/p extubation. Continue to monitor saturation closely.  Sepsis secondary to E. coli UTI. Patient is off pressors now. Continue Rocephin  therapy. Follow susceptibilities. Monitor vitals closely.  Electrolyte imbalances: Hypokalemia - oral replacements ordered. Hypocalcemia improved. Hypophosphatemia- oral and IV phos replacement ordered. Continue to monitor electrolytes and supplement as needed.  Essential hypertension: Monitor blood pressure.  Resume antihypertensives if BP remains high.  Hyperlipidemia-continue statin therapy.  History of CHF per report Echocardiogram within normal limits.  She is euvolemic. Avoid fluid overload.  At risk malnutrition Hypoalbuminemia Continue tube feeds.  Will start oral diet and taper off tube feeds.        Out  of bed to chair. Incentive spirometry. Nursing supportive care. Fall, aspiration precautions. Diet:  Diet Orders (From admission, onward)     Start     Ordered   06/04/23 1301  DIET DYS 3 Room service appropriate? Yes with Assist; Fluid consistency: Thin  Diet effective now       Question Answer Comment  Room service appropriate? Yes with Assist   Fluid consistency: Thin      06/04/23 1301           DVT prophylaxis: heparin  injection 5,000 Units Start: 05/30/23 2200 SCDs Start: 05/30/23 1047  Level of care: Telemetry Cardiac   Code Status: Do not attempt resuscitation (DNR) PRE-ARREST INTERVENTIONS DESIRED  Subjective: Patient is seen and examined today morning. She is able to answer me slowly. No distress, has NG tube.able to eat per RN 40%.   Physical Exam: Vitals:   06/04/23 0400 06/04/23 0459 06/04/23 0500 06/04/23 0816  BP: 128/60  124/65 (!) 140/59  Pulse: 88 87  89  Resp: 19  19 20   Temp: 98.1 F (36.7 C)  97.6 F (36.4 C) 98.1 F (36.7 C)  TempSrc: Axillary Oral  Oral  SpO2: 98% 99%  98%  Weight: 65.1 kg     Height:        General - Elderly African American female, no apparent distress HEENT - PERRLA, EOMI, atraumatic head, NG tube noted Lung - Clear, basal rales, rhonchi, wheezes. Heart - S1, S2 heard, no murmurs, rubs, trace  pedal edema. Abdomen - Soft, non tender, bowel sounds good  Neuro - Alert, awake, slow mentation, non focal exam. Skin - Warm and dry.  Data Reviewed:      Latest Ref Rng & Units 06/04/2023    8:15 AM 06/03/2023    4:32 AM  06/02/2023   11:53 AM  CBC  WBC 4.0 - 10.5 K/uL 9.6  10.0  11.1   Hemoglobin 12.0 - 15.0 g/dL 9.3  8.5  8.2   Hematocrit 36.0 - 46.0 % 30.6  27.8  27.1   Platelets 150 - 400 K/uL 258  214  241       Latest Ref Rng & Units 06/04/2023    8:15 AM 06/03/2023    4:32 AM 06/02/2023    7:18 AM  BMP  Glucose 70 - 99 mg/dL 147  829  562   BUN 8 - 23 mg/dL 18  14  14    Creatinine 0.44 - 1.00 mg/dL 1.30  8.65   7.84   Sodium 135 - 145 mmol/L 144  142  140   Potassium 3.5 - 5.1 mmol/L 3.4  3.3  3.8   Chloride 98 - 111 mmol/L 107  106  107   CO2 22 - 32 mmol/L 26  25  28    Calcium  8.9 - 10.3 mg/dL 9.3  8.8  8.4    No results found.  Family Communication: Discussed with patient, she understand and agree. All questions answered.  Disposition: Status is: Inpatient Remains inpatient appropriate because: tube feeds, monitor blood sugars, PT eval.  Planned Discharge Destination: Skilled nursing facility     Time spent: 45 minutes  Author: Aisha Hove, MD 06/04/2023 3:04 PM Secure chat 7am to 7pm For on call review [email or web address].

## 2023-06-04 NOTE — Progress Notes (Signed)
 VASCULAR LAB    Left upper extremity venous duplex has been performed.  See CV proc for preliminary results.   Nyia Tsao, RVT 06/04/2023, 3:30 PM

## 2023-06-05 DIAGNOSIS — E43 Unspecified severe protein-calorie malnutrition: Secondary | ICD-10-CM | POA: Diagnosis not present

## 2023-06-05 DIAGNOSIS — E876 Hypokalemia: Secondary | ICD-10-CM | POA: Diagnosis not present

## 2023-06-05 DIAGNOSIS — R569 Unspecified convulsions: Secondary | ICD-10-CM | POA: Diagnosis not present

## 2023-06-05 DIAGNOSIS — E162 Hypoglycemia, unspecified: Secondary | ICD-10-CM | POA: Diagnosis not present

## 2023-06-05 LAB — GLUCOSE, CAPILLARY
Glucose-Capillary: 130 mg/dL — ABNORMAL HIGH (ref 70–99)
Glucose-Capillary: 139 mg/dL — ABNORMAL HIGH (ref 70–99)
Glucose-Capillary: 147 mg/dL — ABNORMAL HIGH (ref 70–99)
Glucose-Capillary: 150 mg/dL — ABNORMAL HIGH (ref 70–99)
Glucose-Capillary: 197 mg/dL — ABNORMAL HIGH (ref 70–99)
Glucose-Capillary: 96 mg/dL (ref 70–99)

## 2023-06-05 LAB — BASIC METABOLIC PANEL WITH GFR
Anion gap: 11 (ref 5–15)
BUN: 19 mg/dL (ref 8–23)
CO2: 24 mmol/L (ref 22–32)
Calcium: 9.1 mg/dL (ref 8.9–10.3)
Chloride: 105 mmol/L (ref 98–111)
Creatinine, Ser: 0.43 mg/dL — ABNORMAL LOW (ref 0.44–1.00)
GFR, Estimated: >60 mL/min (ref >60)
Glucose, Bld: 141 mg/dL — ABNORMAL HIGH (ref 70–99)
Potassium: 3.6 mmol/L (ref 3.5–5.1)
Sodium: 140 mmol/L (ref 135–145)

## 2023-06-05 LAB — CBC
HCT: 30.6 % — ABNORMAL LOW (ref 36.0–46.0)
Hemoglobin: 9.4 g/dL — ABNORMAL LOW (ref 12.0–15.0)
MCH: 26.6 pg (ref 26.0–34.0)
MCHC: 30.7 g/dL (ref 30.0–36.0)
MCV: 86.7 fL (ref 80.0–100.0)
Platelets: 315 10*3/uL (ref 150–400)
RBC: 3.53 MIL/uL — ABNORMAL LOW (ref 3.87–5.11)
RDW: 19.1 % — ABNORMAL HIGH (ref 11.5–15.5)
WBC: 11.6 10*3/uL — ABNORMAL HIGH (ref 4.0–10.5)
nRBC: 0 % (ref 0.0–0.2)

## 2023-06-05 LAB — PHOSPHORUS: Phosphorus: 2.7 mg/dL (ref 2.5–4.6)

## 2023-06-05 LAB — CULTURE, BLOOD (ROUTINE X 2)

## 2023-06-05 MED ORDER — ANASTROZOLE 1 MG PO TABS
1.0000 mg | ORAL_TABLET | Freq: Every day | ORAL | Status: DC
  Administered 2023-06-06 – 2023-06-12 (×7): 1 mg via ORAL
  Filled 2023-06-05 (×7): qty 1

## 2023-06-05 MED ORDER — ASPIRIN 81 MG PO CHEW
81.0000 mg | CHEWABLE_TABLET | Freq: Every day | ORAL | Status: DC
  Administered 2023-06-06 – 2023-06-12 (×7): 81 mg via ORAL
  Filled 2023-06-05 (×7): qty 1

## 2023-06-05 MED ORDER — ADULT MULTIVITAMIN W/MINERALS CH
1.0000 | ORAL_TABLET | Freq: Every day | ORAL | Status: DC
  Administered 2023-06-06 – 2023-06-12 (×7): 1 via ORAL
  Filled 2023-06-05 (×7): qty 1

## 2023-06-05 MED ORDER — PHENOL 1.4 % MT LIQD
1.0000 | OROMUCOSAL | Status: DC | PRN
  Administered 2023-06-06: 1 via OROMUCOSAL
  Filled 2023-06-05: qty 177

## 2023-06-05 MED ORDER — TRAZODONE HCL 50 MG PO TABS
25.0000 mg | ORAL_TABLET | Freq: Every evening | ORAL | Status: DC | PRN
Start: 2023-06-05 — End: 2023-06-12
  Administered 2023-06-05 – 2023-06-11 (×6): 25 mg via ORAL
  Filled 2023-06-05 (×6): qty 1

## 2023-06-05 MED ORDER — ACETAMINOPHEN 325 MG PO TABS
650.0000 mg | ORAL_TABLET | ORAL | Status: DC | PRN
  Administered 2023-06-06 – 2023-06-11 (×3): 650 mg via ORAL
  Filled 2023-06-05 (×3): qty 2

## 2023-06-05 MED ORDER — MIDODRINE HCL 5 MG PO TABS
5.0000 mg | ORAL_TABLET | Freq: Three times a day (TID) | ORAL | Status: AC
  Administered 2023-06-05 – 2023-06-06 (×4): 5 mg via ORAL
  Filled 2023-06-05 (×4): qty 1

## 2023-06-05 MED ORDER — ENSURE ENLIVE PO LIQD
237.0000 mL | Freq: Two times a day (BID) | ORAL | Status: DC
  Administered 2023-06-05 – 2023-06-12 (×13): 237 mL via ORAL

## 2023-06-05 MED ORDER — THIAMINE MONONITRATE 100 MG PO TABS
100.0000 mg | ORAL_TABLET | Freq: Every day | ORAL | Status: AC
  Administered 2023-06-06: 100 mg via ORAL
  Filled 2023-06-05: qty 1

## 2023-06-05 NOTE — Plan of Care (Signed)

## 2023-06-05 NOTE — Progress Notes (Signed)
 Progress Note   Patient: Carolyn [facility] WUJ:[id number] DOB: [birth year] DOA: 05/30/2023     6 DOS: the patient was seen and examined on 06/05/2023   Brief hospital course: Carolyn [facility] is a 83 y.o. female who has a PMH as below including but not limited to prior CVA with residual weakness and deficits, dementia, functional decline, breast CA s/p XRT and mastectomy 2020. She resides at [facility].   Patient is admitted to ICU as code stroke on admission, revealed seizure activity in the setting of severe hyperglycemia.  Patient self extubated 4/24.  Patient had low-grade fever, worsening leukocytosis remained on IV pressors.  Urine cultures positive for E. coli, now off pressors transferred to TRH service.  Assessment and Plan: New onset seizure  suspected secondary to hypoglycemia Continue neurochecks. Continue fall, seizure precautions. Monitor blood sugars, electrolytes closely.  Hypoglycemia: Continue to monitor blood sugars closely. Monitor oral intake/ calorie count and gradually wean off nocturnal tube feeds. Patient's blood sugar stable so far. Hypoglycemia protocol.  Acute respiratory failure with inability to protect airway S/p extubation. Continue to monitor saturation closely.  Sepsis secondary to E. coli UTI. E.coli bacteremia Patient is off pressors now. Continue Rocephin  therapy. Susceptibilities reviewed. Transition to bactrim upon dc. Monitor vitals closely.  Electrolyte imbalances: Hypokalemia - resolved Hypocalcemia improved. Hypophosphatemia- improved. Continue to monitor electrolytes and supplement as needed.  Essential hypertension: Monitor blood pressure.  Resume antihypertensives if BP remains high.  Hyperlipidemia-continue statin therapy.  History of CHF- Echocardiogram within normal limits.  She is euvolemic. Avoid fluid overload.  Severe malnutrition Hypoalbuminemia Dietician on board monitoring calorie count. If her oral intake  improved plan to remove Cortrak.   Nutrition Documentation    Flowsheet Row ED to Hosp-Admission (Current) from 05/30/2023 in [place] 6E Progressive Care  Nutrition Problem Severe Malnutrition  Etiology chronic illness  Nutrition Goal Patient will meet greater than or equal to 90% of their needs  Interventions Tube feeding, Prostat, MVI     ,  Active Pressure Injury/Wound(s)     Pressure Ulcer  Duration          Pressure Injury 05/31/23 Buttocks Medial Deep Tissue Pressure Injury - Purple or maroon localized area of discolored intact skin or blood-filled blister due to damage of underlying soft tissue from pressure and/or shear. 5 days           Out of bed to chair. Incentive spirometry. Nursing supportive care. Fall, aspiration precautions. Diet:  Diet Orders (From admission, onward)     Start     Ordered   06/04/23 1301  DIET DYS 3 Room service appropriate? Yes with Assist; Fluid consistency: Thin  Diet effective now       Question Answer Comment  Room service appropriate? Yes with Assist   Fluid consistency: Thin      06/04/23 1301           DVT prophylaxis: heparin  injection 5,000 Units Start: 05/30/23 2200 SCDs Start: 05/30/23 1047  Level of care: Telemetry Cardiac   Code Status: Do not attempt resuscitation (DNR) PRE-ARREST INTERVENTIONS DESIRED  Subjective: Patient is seen and examined today morning. She is able to answer me slowly. Does complain of throat discomfort.   Physical Exam: Vitals:   06/05/23 0834 06/05/23 0933 06/05/23 1000 06/05/23 1134  BP: (!) 120/58 (!) 121/54  116/62  Pulse: 94 92 93 94  Resp: (!) 22 (!) 25 (!) 24 18  Temp: 99.4 F (37.4 C)   99.9 F (37.7  C)  TempSrc: Axillary   Oral  SpO2: 100% 100% 100% 100%  Weight:      Height:        General - Elderly African American female, distress due to throat discomfort. HEENT - PERRLA, EOMI, atraumatic head, NG tube noted Lung - Clear, basal rales, rhonchi, wheezes. Heart -  S1, S2 heard, no murmurs, rubs, trace  pedal edema. Abdomen - Soft, non tender, bowel sounds good  Neuro - Alert, awake, slow mentation, non focal exam. Skin - Warm and dry.  Data Reviewed:      Latest Ref Rng & Units 06/05/2023    4:58 AM 06/04/2023    8:15 AM 06/03/2023    4:32 AM  CBC  WBC 4.0 - 10.5 K/uL 11.6  9.6  10.0   Hemoglobin 12.0 - 15.0 g/dL 9.4  9.3  8.5   Hematocrit 36.0 - 46.0 % 30.6  30.6  27.8   Platelets 150 - 400 K/uL 315  258  214       Latest Ref Rng & Units 06/05/2023    4:58 AM 06/04/2023    8:15 AM 06/03/2023    4:32 AM  BMP  Glucose 70 - 99 mg/dL 161  096  045   BUN 8 - 23 mg/dL 19  18  14    Creatinine 0.44 - 1.00 mg/dL 4.09  8.11  9.14   Sodium 135 - 145 mmol/L 140  144  142   Potassium 3.5 - 5.1 mmol/L 3.6  3.4  3.3   Chloride 98 - 111 mmol/L 105  107  106   CO2 22 - 32 mmol/L 24  26  25    Calcium  8.9 - 10.3 mg/dL 9.1  9.3  8.8    VAS US  UPPER EXTREMITY VENOUS DUPLEX Result Date: 06/05/2023 UPPER VENOUS STUDY  Patient Name:  Carolyn [facility]  Date of Exam:   06/04/2023 Medical Rec #: [id number]          Accession #:    [id number] Date of Birth: [birth year]          Patient Gender: F Patient Age:   15 years Exam Location:  [facility] Procedure:      VAS US  UPPER EXTREMITY VENOUS DUPLEX Referring Phys: Laurina Popper HARRIS --------------------------------------------------------------------------------  Indications: Edema, and Erythema Limitations: Edema, patient moving head and would not keep arm pulled out away from body and poor ultrasound/tissue interface. Comparison Study: No prior study on file Performing Technologist: Carleene Chase RVS  Examination Guidelines: A complete evaluation includes B-mode imaging, spectral Doppler, color Doppler, and power Doppler as needed of all accessible portions of each vessel. Bilateral testing is considered an integral part of a complete examination. Limited examinations for reoccurring indications may be performed as  noted.  Right Findings: +----------+------------+---------+-----------+----------+-------+ RIGHT     CompressiblePhasicitySpontaneousPropertiesSummary +----------+------------+---------+-----------+----------+-------+ Subclavian               Yes       Yes                      +----------+------------+---------+-----------+----------+-------+  Left Findings: +----------+------------+---------+-----------+----------+---------------+ LEFT      CompressiblePhasicitySpontaneousProperties    Summary     +----------+------------+---------+-----------+----------+---------------+ IJV           Full                                                  +----------+------------+---------+-----------+----------+---------------+  Subclavian    Full       Yes       Yes                              +----------+------------+---------+-----------+----------+---------------+ Axillary                 Yes       Yes                              +----------+------------+---------+-----------+----------+---------------+ Brachial                 Yes       Yes                              +----------+------------+---------+-----------+----------+---------------+ Radial        Full                                                  +----------+------------+---------+-----------+----------+---------------+ Ulnar                                               patent by color +----------+------------+---------+-----------+----------+---------------+ Cephalic      None                                       Acute      +----------+------------+---------+-----------+----------+---------------+ Basilic       Full                                                  +----------+------------+---------+-----------+----------+---------------+  Summary:  Right: No evidence of superficial vein thrombosis in the upper extremity.  Left: No evidence of deep vein thrombosis in the upper  extremity. Findings consistent with acute superficial vein thrombosis involving the left cephalic vein throughout the forearm. Subcutaneous edema noted throughout forearm.  *See table(s) above for measurements and observations.  Diagnosing physician: Angela Kell MD Electronically signed by Angela Kell MD on 06/05/2023 at 11:06:27 AM.    Final     Family Communication: Discussed with patient, she understand and agree. All questions answered.  Disposition: Status is: Inpatient Remains inpatient appropriate because: tube feeds, monitor blood sugars, PT eval.  Planned Discharge Destination: Skilled nursing facility     Time spent: 42 minutes  Author: Aisha Hove, MD 06/05/2023 1:32 PM Secure chat 7am to 7pm For on call review [email or web address].

## 2023-06-05 NOTE — Evaluation (Signed)
 Occupational Therapy Evaluation Patient Details Name: Carolyn [facility] MRN: [id number] DOB: 21-Oct-1940 Today's Date: 06/05/2023   History of Present Illness   Pt is an 83 y.o. female who presented 05/30/23 with AMS and R gaze. Witnessed seizure en route to ED. MRI showed no acute intracranial abnormality. Pt admitted with hypoglycemia provoked seizure and sepsis secondary to UTI. Cortrak placed 4/25. ETT 4/23 - 4/24. PMH: prior CVA with residual weakness and deficits, dementia, functional decline, breast CA s/p XRT and mastectomy 2020, CHF, DM, HLD, HTN, schizoaffective disorder     Clinical Impressions Pt resting in bed, leaning to R side comfortably with pillows for support. Pt from [facility], PLOF total A for all ADLs and mobility, hoyer for OOB activities, reports she rarely gets OOB. Pt able to state date and name, did not know she was in [facility], not aware of situation. Pt likely at baseline, able to perform some LUE basic tasks, holding cups, has difficulty with holding utensils but would be able to perform some finger feeding. Pt total A for all bed mobility when attempting to sit EOB, roll L/R, little to now AROM to BLEs. Pt RUE has little to now AROM, minimal elbow flexion, some gross grip to R hand, history of CVA with residual deficits. Pt at this time is at baseline, has no further acute OT needs, return to [facility] is appropriate.      If plan is discharge home, recommend the following:   Two people to help with walking and/or transfers;A lot of help with bathing/dressing/bathroom;Assistance with cooking/housework;Assistance with feeding;Assist for transportation;Help with stairs or ramp for entrance;Direct supervision/assist for medications management     Functional Status Assessment   Patient has had a recent decline in their functional status and demonstrates the ability to make significant improvements in function in a reasonable and predictable amount of time.      Equipment Recommendations   None recommended by OT     Recommendations for Other Services         Precautions/Restrictions   Precautions Precautions: Fall Recall of Precautions/Restrictions: Impaired Precaution/Restrictions Comments: cortrak Restrictions Weight Bearing Restrictions Per Provider Order: No     Mobility Bed Mobility Overal bed mobility: Needs Assistance Bed Mobility: Rolling Rolling: Total assist         General bed mobility comments: total A for all bed mobility    Transfers Overall transfer level: Needs assistance                 General transfer comment: hoyer at baseline      Balance Overall balance assessment: Needs assistance Sitting-balance support: No upper extremity supported, Feet supported Sitting balance-Leahy Scale: Poor                                     ADL either performed or assessed with clinical judgement   ADL Overall ADL's : At baseline;Needs assistance/impaired                                       General ADL Comments: Pt likely at baseline, able to perform some self feeding with fingers, hold cups, total A for all other ADLs.     Vision         Perception         Praxis  Pertinent Vitals/Pain Pain Assessment Pain Assessment: No/denies pain     Extremity/Trunk Assessment Upper Extremity Assessment Upper Extremity Assessment: RUE deficits/detail;LUE deficits/detail RUE Deficits / Details: little to no AROM to RUE, history of CVA, not able to use functionally, some elbow flexion and gross grip. RUE Coordination: decreased fine motor;decreased gross motor LUE Deficits / Details: Functional for basic tasks, some finger feeding, able to hold cups, poor FM skills. LUE Coordination: decreased fine motor;decreased gross motor   Lower Extremity Assessment Lower Extremity Assessment: Defer to PT evaluation       Communication Communication Communication:  Impaired Factors Affecting Communication: Reduced clarity of speech;Difficulty expressing self   Cognition Arousal: Alert Behavior During Therapy: WFL for tasks assessed/performed Cognition: History of cognitive impairments                               Following commands: Impaired Following commands impaired: Follows one step commands with increased time     Cueing  General Comments   Cueing Techniques: Verbal cues;Tactile cues;Gestural cues      Exercises     Shoulder Instructions      Home Living Family/patient expects to be discharged to:: Skilled nursing facility                                 Additional Comments: from [facility], long term resident      Prior Functioning/Environment Prior Level of Function : Needs assist             Mobility Comments: Hoyer lift transfers OOB; does not get OOB often and does not use w/c much anymore, primarily only goes to a recliner if OOB; pt can reach to assist in rolling but needs assistance to complete; Heavy assist for supine to sit and sitting balance ADLs Comments: bed baths; can hold sippy cup but needs assistance to use utensils; can feed herself finger foods; needs assistance for meds    OT Problem List: Decreased strength;Decreased range of motion;Decreased activity tolerance;Impaired balance (sitting and/or standing);Decreased coordination;Decreased cognition;Impaired UE functional use   OT Treatment/Interventions:        OT Goals(Current goals can be found in the care plan section)   Acute Rehab OT Goals Patient Stated Goal: return to [facility] OT Goal Formulation: With patient Time For Goal Achievement: 06/19/23 Potential to Achieve Goals: Good   OT Frequency:       Co-evaluation              AM-PAC OT "6 Clicks" Daily Activity     Outcome Measure Help from another person eating meals?: A Lot Help from another person taking care of personal grooming?: Total Help from  another person toileting, which includes using toliet, bedpan, or urinal?: Total Help from another person bathing (including washing, rinsing, drying)?: Total Help from another person to put on and taking off regular upper body clothing?: Total Help from another person to put on and taking off regular lower body clothing?: Total 6 Click Score: 7   End of Session Nurse Communication: Mobility status  Activity Tolerance: Patient tolerated treatment well Patient left: in bed;with call bell/phone within reach  OT Visit Diagnosis: Muscle weakness (generalized) (M62.81);Feeding difficulties (R63.3);Other symptoms and signs involving cognitive function;Hemiplegia and hemiparesis                Time: 1308-6578 OT Time Calculation (min): 15 min Charges:  OT General Charges $OT Visit: 1 Visit OT Evaluation $OT Eval Moderate Complexity: 1 [address], OTR/L   Scherry Curtis 06/05/2023, 11:31 AM

## 2023-06-05 NOTE — Progress Notes (Signed)
 Nutrition Follow-up  DOCUMENTATION CODES:  Severe malnutrition in context of chronic illness  INTERVENTION:  48 hour calorie count Ensure Enlive po BID, each supplement provides 350 kcal and 20 grams of protein. Continue nocturnal TF via Cortrak: Osmolite 1.5 at 40ml x 12 hours (480ml per day) 60ml ProSource TF20 BID Provides 880 kcal (59% minimum estimated needs), 70g protein (74% minimum estimated needs), free water  daily  NUTRITION DIAGNOSIS:  Severe Malnutrition related to chronic illness as evidenced by severe muscle depletion, severe fat depletion. - remains applicable  GOAL:  Patient will meet greater than or equal to 90% of their needs - goal unmet, addressing via diet advancement, TF and nutrition supplements  MONITOR:   TF tolerance, Diet advancement  REASON FOR ASSESSMENT:   Consult Enteral/tube feeding initiation and management  ASSESSMENT:   Pt with PMH of prior CVA with residual weakness and deficits, schizoaffective disorder, CHF, DM, dementia, functional decline, breast ca s/p XRT and mastectomy 2020 who lives at [facility] admitted for seizures suspected due to hypoglycemia.  4/25: cortrak placed 4/26: s/p MBS- diet advanced to dysphagia 1, honey thick liquids 4/28: diet upgraded to dysphagia 3, thin liquids 4/29: diet downgraded to dysphagia 2 for ease of chewing (edentulous)  Checked in with pt at bedside. She is pleasant but a limited historian.  No family present at that time.  Spoke with RN who reports pt is eating well but is having difficulty with meat items d/t edentulous status. Reached out to SLP to discuss trying pt on dysphagia 2 diet. ST spoke with pt's daughter who plans to bring in pt's dentures and then will reassess on another date safest diet option for pt. Plan to trial dysphagia 2 diet for now.   RD ordered calorie count, hung envelope and discussed with RN and MD. Will gather objective data on nutritional adequacy to assess  necessity of continuing nocturnal TF versus discontinuing.   Meal completions: 4/26: 10% lunch, 30% dinner 4/27: 75% breakfast, 25% lunch 4/28: 30% breakfast, 75% lunch 4/29: 40% breakfast  Admit weight: 64.4 kg Current weight: 67.2 kg  Medications: MVI, thiamine , IV abx  Labs:   CBG's 139-150 x24 hours  Diet Order:   Diet Order             DIET DYS 2 Room service appropriate? Yes with Assist; Fluid consistency: Thin  Diet effective now                   EDUCATION NEEDS:   Not appropriate for education at this time  Skin:  Skin Assessment: Skin Integrity Issues: Skin Integrity Issues:: DTI DTI: buttocks  Last BM:  x24 hours via FMS  Height:   Ht Readings from Last 1 Encounters:  05/30/23 5\' 6"  (1.676 m)    Weight:   Wt Readings from Last 1 Encounters:  06/05/23 67.2 kg   BMI:  Body mass index is 23.91 kg/m.  Estimated Nutritional Needs:   Kcal:  1500-1700  Protein:  95-105 grams  Fluid:  >1.5 L/day  Carolyn [facility], RDN, LDN Clinical Nutrition See AMiON for contact information.

## 2023-06-05 NOTE — Progress Notes (Signed)
 Speech Language Pathology Treatment: Dysphagia  Patient Details Name: Carolyn [facility] MRN: [id number] DOB: 1940-05-21 Today's Date: 06/05/2023 Time: 1357-1410 SLP Time Calculation (min) (ACUTE ONLY): 13 min  Assessment / Plan / Recommendation Clinical Impression  Pt had just finished eating her lunch and was not interested in more food, but drank thin liquids without overt difficulty. Her daughter is at bedside and confirms that she has been on thin liquids PTA after previous swallow studies. SLP did provide Min cues for pacing. Note that pt had left a lot of the meat on her tray and per nurse and dietitian, meat has been a little challenging since advancement to mechanical soft. Daughter clarifies that pt is on mechanical soft primarily because she will not touch pureed foods. Whenever she makes food for her mom, she chops the food as finely as she can. She was doing this with the food on her trays here too. She is not interested in a pureed diet because she knows she will not eat enough of it, but she is interested in trying Dys 2 (minced/finely chopped) as this may be more consistent with baseline modifications. She is also planning to bring her dentures in, as she typically eats with them in place. SLP adjusted diet orders and will f/u at least briefly to see if this is a better fit for safety and efficiency.   HPI HPI: Carolyn [facility] is an 83 yo female presenting to ED from [facility] 4/23 with AMS and R gaze deviation. Admitted with status epilepticus in the setting of severe hypoglycemia. Ultimately intubated for airway protection, 4/23 and self-extubated 4/24. PMH includes prior CVA with residual weakness and deficits, dementia, functional decline, breast cancer s/p XRT and mastectomy 2020      SLP Plan  Continue with current plan of care      Recommendations for follow up therapy are one component of a multi-disciplinary discharge planning process, led by the attending physician.   Recommendations may be updated based on patient status, additional functional criteria and insurance authorization.    Recommendations  Diet recommendations: Dysphagia 2 (fine chop);Thin liquid Liquids provided via: Cup;Straw Medication Administration: Crushed with puree Supervision: Staff to assist with self feeding;Full supervision/cueing for compensatory strategies Compensations: Minimize environmental distractions;Slow rate;Small sips/bites Postural Changes and/or Swallow Maneuvers: Seated upright 90 degrees                  Oral care BID   Frequent or constant Supervision/Assistance Dysphagia, pharyngeal phase (R13.13)     Continue with current plan of care     Beth Brooke., M.A. CCC-SLP Acute Rehabilitation Services Office: [phone]  Secure chat preferred   06/05/2023, 3:07 PM

## 2023-06-05 NOTE — Care Management Important Message (Signed)
 Important Message  Patient Details  Name: Carolyn [facility] MRN: [id number] Date of Birth: [birth year]   Important Message Given:  Yes - Medicare IM     Janith Melnick 06/05/2023, 11:41 AM

## 2023-06-05 NOTE — TOC Progression Note (Signed)
 Transition of Care [facility]) - Progression Note    Patient Details  Name: WANIA KUTNER MRN: [id number] Date of Birth: Jan 11, 1941  [facility] CM/SW Contact  Carmon Christen, LCSWA Phone Number: 06/05/2023, 5:45 PM  Clinical Narrative:     CSW following to start insurance authorization closer to patient being medically stable for dc.   Expected Discharge Plan: Skilled Nursing Facility Barriers to Discharge: Continued Medical Work up  Expected Discharge Plan and Services In-house Referral: Clinical Social Work   Post Acute Care Choice: Skilled Nursing Facility Living arrangements for the past 2 months: Skilled Nursing Facility                                       Social Determinants of Health (SDOH) Interventions SDOH Screenings   Food Insecurity: Patient Unable To Answer (06/01/2023)  Housing: Patient Unable To Answer (06/01/2023)  Transportation Needs: Patient Unable To Answer (06/01/2023)  Utilities: Patient Unable To Answer (06/01/2023)  Depression (PHQ2-9): Medium Risk (12/03/2018)  Financial Resource Strain: Low Risk  (01/15/2018)  Physical Activity: Inactive (01/15/2018)  Social Connections: Unknown (06/01/2023)  Stress: Stress Concern Present (01/15/2018)  Tobacco Use: Low Risk  (05/30/2023)    Readmission Risk Interventions     No data to display

## 2023-06-06 DIAGNOSIS — R569 Unspecified convulsions: Secondary | ICD-10-CM | POA: Diagnosis not present

## 2023-06-06 LAB — GLUCOSE, CAPILLARY
Glucose-Capillary: 136 mg/dL — ABNORMAL HIGH (ref 70–99)
Glucose-Capillary: 132 mg/dL — ABNORMAL HIGH (ref 70–99)
Glucose-Capillary: 125 mg/dL — ABNORMAL HIGH (ref 70–99)
Glucose-Capillary: 88 mg/dL (ref 70–99)
Glucose-Capillary: 178 mg/dL — ABNORMAL HIGH (ref 70–99)
Glucose-Capillary: 132 mg/dL — ABNORMAL HIGH (ref 70–99)

## 2023-06-06 LAB — CBC
HCT: 27.4 % — ABNORMAL LOW (ref 36.0–46.0)
Hemoglobin: 8.5 g/dL — ABNORMAL LOW (ref 12.0–15.0)
MCH: 27.2 pg (ref 26.0–34.0)
MCHC: 31 g/dL (ref 30.0–36.0)
MCV: 87.5 fL (ref 80.0–100.0)
Platelets: 366 10*3/uL (ref 150–400)
RBC: 3.13 MIL/uL — ABNORMAL LOW (ref 3.87–5.11)
RDW: 19.6 % — ABNORMAL HIGH (ref 11.5–15.5)
WBC: 10.6 10*3/uL — ABNORMAL HIGH (ref 4.0–10.5)
nRBC: 0 % (ref 0.0–0.2)

## 2023-06-06 LAB — BASIC METABOLIC PANEL WITH GFR
Anion gap: 10 (ref 5–15)
BUN: 22 mg/dL (ref 8–23)
CO2: 24 mmol/L (ref 22–32)
Calcium: 9.3 mg/dL (ref 8.9–10.3)
Chloride: 107 mmol/L (ref 98–111)
Creatinine, Ser: 0.44 mg/dL (ref 0.44–1.00)
GFR, Estimated: >60 mL/min (ref >60)
Glucose, Bld: 118 mg/dL — ABNORMAL HIGH (ref 70–99)
Potassium: 4.5 mmol/L (ref 3.5–5.1)
Sodium: 141 mmol/L (ref 135–145)

## 2023-06-06 LAB — URINE CULTURE
Culture: >=100000 — AB
Special Requests: NORMAL

## 2023-06-06 LAB — CULTURE, BLOOD (ROUTINE X 2): Culture: NO GROWTH

## 2023-06-06 LAB — PHOSPHORUS: Phosphorus: 3.1 mg/dL (ref 2.5–4.6)

## 2023-06-06 NOTE — Plan of Care (Signed)
   Problem: Education: Goal: Knowledge of General Education information will improve Description: Including pain rating scale, medication(s)/side effects and non-pharmacologic comfort measures Outcome: Progressing   Problem: Nutrition: Goal: Adequate nutrition will be maintained Outcome: Progressing

## 2023-06-06 NOTE — Progress Notes (Signed)
 PROGRESS NOTE  Carolyn Parrish LKG:[id number] DOB: Aug 16, 1940 DOA: 05/30/2023 PCP: Pcp, No   LOS: 7 days   Brief narrative:  Carolyn Parrish is a 83 y.o. female with past medical history of CVA with residual weakness and deficits, dementia, functional decline, breast CA s/p XRT and mastectomy 2020 currently residing at [facility] was initially admitted to the ICU as code stroke.  Patient had seizure-like activity in the setting of severe hypoglycemia.  Self extubated on 05/31/2023.  Patient had low-grade fever, worsening leukocytosis and was on IV vasopressors.  Urine cultures were positive for E. coli.  Subsequently patient was transferred out of the ICU when her blood pressures were stable..       Assessment/Plan: Principal Problem:   Seizure (HCC) Active Problems:   Protein-calorie malnutrition, severe  Assessment and Plan:  New onset seizure  suspected secondary to hypoglycemia. No further seizures.  Will continue to monitor closely.  Seizure precautions.  Metabolic encephalopathy.  Still somnolent with aphasia.  Minimally interactive.   Hypoglycemia: On high glycemic protocol.  Monitor oral intake.  Was on nocturnal feeds.  Currently on dysphagia 2 diet.  Latest POC glucose of 132.   Acute respiratory failure with inability to protect airway S/p extubation.  Currently on room air.   Sepsis secondary to E. coli UTI. E.coli bacteremia from blood cultures 05/30/23 Off vasopressors.  Continue IV Rocephin .  Susceptibilities are back.  Transition to Bactrim on discharge.  Temperature max of 99.9 F.  Latest blood pressure of 103/49.  Hypokalemia - resolved.  Latest potassium of 4.5.  Hypocalcemia improved.  Latest calcium  of 9.3.  Hypophosphatemia- improved.  Latest phosphorus of 3.1.   Essential hypertension: Monitor blood pressure.  Marginally low at this time.   Hyperlipidemia Continue statins.   History of CHF- 2 D echocardiogram within normal limits, currently  compensated.   Severe malnutrition Hypoalbuminemia Body mass index is 22.24 kg/m.  Nutrition Status: Nutrition Problem: Severe Malnutrition Etiology: chronic illness Signs/Symptoms: severe muscle depletion, severe fat depletion Interventions: Tube feeding, Prostat, MVI  Dietitian on board.  If oral intake improved remove cortrak tube.  On dysphagia 2 diet.  On 48-hour calorie count.  On nocturnal feeds.  Deep tissue injury of the medial buttocks.  Present on admission.  Continue pressure ulcer prevention protocol. Pressure Injury 05/31/23 Buttocks Medial Deep Tissue Pressure Injury - Purple or maroon localized area of discolored intact skin or blood-filled blister due to damage of underlying soft tissue from pressure and/or shear. (Active)  05/31/23 0523  Location: Buttocks  Location Orientation: Medial  Staging: Deep Tissue Pressure Injury - Purple or maroon localized area of discolored intact skin or blood-filled blister due to damage of underlying soft tissue from pressure and/or shear.  Wound Description (Comments):   Present on Admission: Yes       DVT prophylaxis: heparin  injection 5,000 Units Start: 05/30/23 2200 SCDs Start: 05/30/23 1047   Disposition: Skilled nursing facility as per PT evaluation.  Patient from skilled nursing facility.  TOC on board.  Status is: Inpatient  Remains inpatient appropriate because: Pending clinical improvement, debility, cortrak tube tube feeding, need for skilled nursing facility placement.    Code Status:     Code Status: Do not attempt resuscitation (DNR) PRE-ARREST INTERVENTIONS DESIRED  Family Communication: None at bedside.  Consultants: PCCM  Procedures: Cortrak tube placement  Anti-infectives:  Rocephin  IV  Anti-infectives (From admission, onward)    Start     Dose/Rate Route Frequency Ordered Stop   06/01/23  1000  cefTRIAXone  (ROCEPHIN ) 2 g in sodium chloride  0.9 % 100 mL IVPB        2 g 200 mL/hr over 30 Minutes  Intravenous Every 24 hours 06/01/23 0755 06/08/23 0959       Subjective: Today, patient was seen and examined at bedside.  Nursing staff at bedside.  Patient appears to be sleepy and has indistinct speech.  Objective: Vitals:   06/06/23 0402 06/06/23 0736  BP: (!) 105/50 (!) 103/49  Pulse: 85 82  Resp: 20 20  Temp: 99.7 F (37.6 C) 98.1 F (36.7 C)  SpO2: 100% 100%    Intake/Output Summary (Last 24 hours) at 06/06/2023 0944 Last data filed at 06/06/2023 0902 Gross per 24 hour  Intake 1300.67 ml  Output 1000 ml  Net 300.67 ml   Filed Weights   06/04/23 0400 06/05/23 0421 06/06/23 0402  Weight: 65.1 kg 67.2 kg 62.5 kg   Body mass index is 22.24 kg/m.   Physical Exam: GENERAL: Patient is somnolent with indistinct speech, aphasia at baseline, not in obvious distress.  Appears weak and deconditioned.  Elderly female.  Cortrak tube tube in place. HENT: No scleral pallor or icterus. Pupils equally reactive to light. Oral mucosa is moist NECK: is supple, no gross swelling noted. CHEST: Clear to auscultation. No crackles or wheezes.   CVS: S1 and S2 heard, no murmur. Regular rate and rhythm.  ABDOMEN: Soft, non-tender, bowel sounds are present. EXTREMITIES: No edema. CNS: Weakness of the extremities noted moving some extremities.  Aphasic. SKIN: warm and dry without rashes.  Data Review: I have personally reviewed the following laboratory data and studies,  CBC: Recent Labs  Lab 05/30/23 0945 05/30/23 1008 06/02/23 1153 06/03/23 0432 06/04/23 0815 06/05/23 0458 06/06/23 0819  WBC 13.0*   < > 11.1* 10.0 9.6 11.6* 10.6*  NEUTROABS 9.0*  --   --   --   --   --   --   HGB 10.7*   < > 8.2* 8.5* 9.3* 9.4* 8.5*  HCT 37.2   < > 27.1* 27.8* 30.6* 30.6* 27.4*  MCV 92.8   < > 88.9 88.3 89.5 86.7 87.5  PLT 287   < > 241 214 258 315 366   < > = values in this interval not displayed.   Basic Metabolic Panel: Recent Labs  Lab 05/31/23 0821 06/01/23 4098 06/02/23 0718  06/03/23 0432 06/04/23 0815 06/05/23 0458 06/06/23 0456  NA 144 140 140 142 144 140 141  K 3.5 4.1 3.8 3.3* 3.4* 3.6 4.5  CL 111 110 107 106 107 105 107  CO2 24 22 28 25 26 24 24   GLUCOSE 153* 121* 135* 151* 118* 141* 118*  BUN 12 14 14 14 18 19 22   CREATININE 0.52 0.43* 0.32* 0.39* 0.43* 0.43* 0.44  CALCIUM  8.7* 8.1* 8.4* 8.8* 9.3 9.1 9.3  MG 1.8 1.8 2.2 2.2 2.2  --   --   PHOS 1.8* 2.7 2.1* 1.6* 1.2* 2.7 3.1   Liver Function Tests: Recent Labs  Lab 05/30/23 0945  AST 39  ALT 34  ALKPHOS 115  BILITOT 1.0  PROT 6.8  ALBUMIN  2.7*   No results for input(s): "LIPASE", "AMYLASE" in the last 168 hours. No results for input(s): "AMMONIA" in the last 168 hours. Cardiac Enzymes: No results for input(s): "CKTOTAL", "CKMB", "CKMBINDEX", "TROPONINI" in the last 168 hours. BNP (last 3 results) No results for input(s): "BNP" in the last 8760 hours.  ProBNP (last 3 results) No results for  input(s): "PROBNP" in the last 8760 hours.  CBG: Recent Labs  Lab 06/05/23 1554 06/05/23 1957 06/05/23 2336 06/06/23 0403 06/06/23 0735  GLUCAP 96 197* 130* 136* 132*   Recent Results (from the past 240 hours)  Resp panel by RT-PCR (RSV, Flu A&B, Covid) Anterior Nasal Swab     Status: None   Collection Time: 05/30/23  9:45 AM   Specimen: Anterior Nasal Swab  Result Value Ref Range Status   SARS Coronavirus 2 by RT PCR NEGATIVE NEGATIVE Final   Influenza A by PCR NEGATIVE NEGATIVE Final   Influenza B by PCR NEGATIVE NEGATIVE Final    Comment: (NOTE) The Xpert Xpress SARS-CoV-2/FLU/RSV plus assay is intended as an aid in the diagnosis of influenza from Nasopharyngeal swab specimens and should not be used as a sole basis for treatment. Nasal washings and aspirates are unacceptable for Xpert Xpress SARS-CoV-2/FLU/RSV testing.  Fact Sheet for Patients: BloggerCourse.com  Fact Sheet for Healthcare Providers: SeriousBroker.it  This test  is not yet approved or cleared by the [place]  FDA and has been authorized for detection and/or diagnosis of SARS-CoV-2 by FDA under an Emergency Use Authorization (EUA). This EUA will remain in effect (meaning this test can be used) for the duration of the COVID-19 declaration under Section 564(b)(1) of the Act, 21 U.S.C. section 360bbb-3(b)(1), unless the authorization is terminated or revoked.     Resp Syncytial Virus by PCR NEGATIVE NEGATIVE Final    Comment: (NOTE) Fact Sheet for Patients: BloggerCourse.com  Fact Sheet for Healthcare Providers: SeriousBroker.it  This test is not yet approved or cleared by the [place]  FDA and has been authorized for detection and/or diagnosis of SARS-CoV-2 by FDA under an Emergency Use Authorization (EUA). This EUA will remain in effect (meaning this test can be used) for the duration of the COVID-19 declaration under Section 564(b)(1) of the Act, 21 U.S.C. section 360bbb-3(b)(1), unless the authorization is terminated or revoked.  Performed at [facility], [address] [place], [place] [address]   Urine Culture     Status: Abnormal (Preliminary result)   Collection Time: 05/30/23  3:03 PM   Specimen: Urine, Catheterized  Result Value Ref Range Status   Specimen Description URINE, CATHETERIZED  Final   Special Requests Normal  Final   Culture (A)  Final    >=100,000 COLONIES/mL ESCHERICHIA COLI SUSCEPTIBILITIES TO FOLLOW Performed at [facility], [address], [place], [place] [address]    Report Status PENDING  Incomplete  MRSA Next Gen by PCR, Nasal     Status: None   Collection Time: 05/31/23  3:55 AM   Specimen: Nasal Mucosa; Nasal Swab  Result Value Ref Range Status   MRSA by PCR Next Gen NOT DETECTED NOT DETECTED Final    Comment: (NOTE) The GeneXpert MRSA Assay (FDA approved for NASAL specimens only), is one component of a comprehensive MRSA  colonization surveillance program. It is not intended to diagnose MRSA infection nor to guide or monitor treatment for MRSA infections. Test performance is not FDA approved in patients less than 13 years old. Performed at [facility], [address] [place], [place] [address]   Culture, blood (Routine X 2) w Reflex to ID Panel     Status: Abnormal   Collection Time: 06/01/23  9:38 AM   Specimen: BLOOD LEFT HAND  Result Value Ref Range Status   Specimen Description BLOOD LEFT HAND  Final   Special Requests   Final  BOTTLES DRAWN AEROBIC AND ANAEROBIC Blood Culture results may not be optimal due to an inadequate volume of blood received in culture bottles   Culture  Setup Time   Final    GRAM NEGATIVE RODS AEROBIC BOTTLE ONLY CRITICAL RESULT CALLED TO, READ BACK BY AND VERIFIED WITH: V BRYK,PHARMD@[id number]  06/02/23 MK Performed at [facility], [address] [place], [place] [address]    Culture ESCHERICHIA COLI (A)  Final   Report Status 06/05/2023 FINAL  Final   Organism ID, Bacteria ESCHERICHIA COLI  Final   Organism ID, Bacteria ESCHERICHIA COLI  Final      Susceptibility   Escherichia coli - KIRBY BAUER*    CEFAZOLIN  INTERMEDIATE Intermediate    Escherichia coli - MIC*    AMPICILLIN >=32 RESISTANT Resistant     CEFEPIME  <=0.12 SENSITIVE Sensitive     CEFTAZIDIME <=1 SENSITIVE Sensitive     CEFTRIAXONE  <=0.25 SENSITIVE Sensitive     CIPROFLOXACIN >=4 RESISTANT Resistant     GENTAMICIN >=16 RESISTANT Resistant     IMIPENEM <=0.25 SENSITIVE Sensitive     TRIMETH/SULFA <=20 SENSITIVE Sensitive     AMPICILLIN/SULBACTAM 16 INTERMEDIATE Intermediate     PIP/TAZO <=4 SENSITIVE Sensitive ug/mL    * ESCHERICHIA COLI    ESCHERICHIA COLI  Blood Culture ID Panel (Reflexed)     Status: Abnormal   Collection Time: 06/01/23  9:38 AM  Result Value Ref Range Status   Enterococcus faecalis NOT DETECTED NOT DETECTED Final   Enterococcus Faecium NOT DETECTED NOT DETECTED Final    Listeria monocytogenes NOT DETECTED NOT DETECTED Final   Staphylococcus species NOT DETECTED NOT DETECTED Final   Staphylococcus aureus (BCID) NOT DETECTED NOT DETECTED Final   Staphylococcus epidermidis NOT DETECTED NOT DETECTED Final   Staphylococcus lugdunensis NOT DETECTED NOT DETECTED Final   Streptococcus species NOT DETECTED NOT DETECTED Final   Streptococcus agalactiae NOT DETECTED NOT DETECTED Final   Streptococcus pneumoniae NOT DETECTED NOT DETECTED Final   Streptococcus pyogenes NOT DETECTED NOT DETECTED Final   A.calcoaceticus-baumannii NOT DETECTED NOT DETECTED Final   Bacteroides fragilis NOT DETECTED NOT DETECTED Final   Enterobacterales DETECTED (A) NOT DETECTED Final    Comment: Enterobacterales represent a large order of gram negative bacteria, not a single organism. CRITICAL RESULT CALLED TO, READ BACK BY AND VERIFIED WITH: V BRYK,PHARMD@0445  06/02/23 MK    Enterobacter cloacae complex NOT DETECTED NOT DETECTED Final   Escherichia coli DETECTED (A) NOT DETECTED Final    Comment: CRITICAL RESULT CALLED TO, READ BACK BY AND VERIFIED WITH: V BRYK,PHARMD@0445  06/02/23 MK    Klebsiella aerogenes NOT DETECTED NOT DETECTED Final   Klebsiella oxytoca NOT DETECTED NOT DETECTED Final   Klebsiella pneumoniae NOT DETECTED NOT DETECTED Final   Proteus species NOT DETECTED NOT DETECTED Final   Salmonella species NOT DETECTED NOT DETECTED Final   Serratia marcescens NOT DETECTED NOT DETECTED Final   Haemophilus influenzae NOT DETECTED NOT DETECTED Final   Neisseria meningitidis NOT DETECTED NOT DETECTED Final   Pseudomonas aeruginosa NOT DETECTED NOT DETECTED Final   Stenotrophomonas maltophilia NOT DETECTED NOT DETECTED Final   Candida albicans NOT DETECTED NOT DETECTED Final   Candida auris NOT DETECTED NOT DETECTED Final   Candida glabrata NOT DETECTED NOT DETECTED Final   Candida krusei NOT DETECTED NOT DETECTED Final   Candida parapsilosis NOT DETECTED NOT DETECTED  Final   Candida tropicalis NOT DETECTED NOT DETECTED Final   Cryptococcus neoformans/gattii NOT DETECTED NOT DETECTED Final   CTX-M ESBL  NOT DETECTED NOT DETECTED Final   Carbapenem resistance IMP NOT DETECTED NOT DETECTED Final   Carbapenem resistance KPC NOT DETECTED NOT DETECTED Final   Carbapenem resistance NDM NOT DETECTED NOT DETECTED Final   Carbapenem resist OXA 48 LIKE NOT DETECTED NOT DETECTED Final   Carbapenem resistance VIM NOT DETECTED NOT DETECTED Final    Comment: Performed at [facility], [address] [place], [place] [address]  Culture, blood (Routine X 2) w Reflex to ID Panel     Status: None   Collection Time: 06/01/23  9:43 AM   Specimen: BLOOD RIGHT ARM  Result Value Ref Range Status   Specimen Description BLOOD RIGHT ARM  Final   Special Requests   Final    AEROBIC BOTTLE ONLY Blood Culture results may not be optimal due to an inadequate volume of blood received in culture bottles   Culture   Final    NO GROWTH 5 DAYS Performed at [facility], [address], [place], [place] [address]    Report Status 06/06/2023 FINAL  Final     Studies: VAS US  UPPER EXTREMITY VENOUS DUPLEX Result Date: 06/05/2023 UPPER VENOUS STUDY  Patient Name:  ARNEDA FALL  Date of Exam:   06/04/2023 Medical Rec #: [id number]          Accession #:    [id number] Date of Birth: [birth year]          Patient Gender: F Patient Age:   89 years Exam Location:  [facility] Procedure:      VAS US  UPPER EXTREMITY VENOUS DUPLEX Referring Phys: [facility] HARRIS --------------------------------------------------------------------------------  Indications: Edema, and Erythema Limitations: Edema, patient moving head and would not keep arm pulled out away from body and poor ultrasound/tissue interface. Comparison Study: No prior study on file Performing Technologist: Carleene Chase RVS  Examination Guidelines: A complete evaluation includes B-mode imaging, spectral Doppler, color  Doppler, and power Doppler as needed of all accessible portions of each vessel. Bilateral testing is considered an integral part of a complete examination. Limited examinations for reoccurring indications may be performed as noted.  Right Findings: +----------+------------+---------+-----------+----------+-------+ RIGHT     CompressiblePhasicitySpontaneousPropertiesSummary +----------+------------+---------+-----------+----------+-------+ Subclavian               Yes       Yes                      +----------+------------+---------+-----------+----------+-------+  Left Findings: +----------+------------+---------+-----------+----------+---------------+ LEFT      CompressiblePhasicitySpontaneousProperties    Summary     +----------+------------+---------+-----------+----------+---------------+ IJV           Full                                                  +----------+------------+---------+-----------+----------+---------------+ Subclavian    Full       Yes       Yes                              +----------+------------+---------+-----------+----------+---------------+ Axillary                 Yes       Yes                              +----------+------------+---------+-----------+----------+---------------+  Brachial                 Yes       Yes                              +----------+------------+---------+-----------+----------+---------------+ Radial        Full                                                  +----------+------------+---------+-----------+----------+---------------+ Ulnar                                               patent by color +----------+------------+---------+-----------+----------+---------------+ Cephalic      None                                       Acute      +----------+------------+---------+-----------+----------+---------------+ Basilic       Full                                                   +----------+------------+---------+-----------+----------+---------------+  Summary:  Right: No evidence of superficial vein thrombosis in the upper extremity.  Left: No evidence of deep vein thrombosis in the upper extremity. Findings consistent with acute superficial vein thrombosis involving the left cephalic vein throughout the forearm. Subcutaneous edema noted throughout forearm.  *See table(s) above for measurements and observations.  Diagnosing physician: Angela Kell MD Electronically signed by Angela Kell MD on 06/05/2023 at 11:06:27 AM.    Final       Rosena Conradi, MD  [facility] 06/06/2023  If 7PM-7AM, please contact night-coverage

## 2023-06-06 NOTE — TOC Progression Note (Signed)
 Transition of Care [facility]) - Progression Note    Patient Details  Name: Carolyn Parrish MRN: [id number] Date of Birth: 08-02-1940  [facility]) CM/SW Contact  Carmon Christen, LCSWA Phone Number: 06/06/2023, 12:33 PM  Clinical Narrative:     CSW following to start insurance authorization closer to patient being medically stable for dc.   Expected Discharge Plan: Skilled Nursing Facility Barriers to Discharge: Continued Medical Work up  Expected Discharge Plan and Services In-house Referral: Clinical Social Work   Post Acute Care Choice: Skilled Nursing Facility Living arrangements for the past 2 months: Skilled Nursing Facility                                       Social Determinants of Health (SDOH) Interventions SDOH Screenings   Food Insecurity: Patient Unable To Answer (06/01/2023)  Housing: Patient Unable To Answer (06/01/2023)  Transportation Needs: Patient Unable To Answer (06/01/2023)  Utilities: Patient Unable To Answer (06/01/2023)  Depression (PHQ2-9): Medium Risk (12/03/2018)  Financial Resource Strain: Low Risk  (01/15/2018)  Physical Activity: Inactive (01/15/2018)  Social Connections: Unknown (06/01/2023)  Stress: Stress Concern Present (01/15/2018)  Tobacco Use: Low Risk  (05/30/2023)    Readmission Risk Interventions     No data to display

## 2023-06-06 NOTE — Plan of Care (Signed)

## 2023-06-06 NOTE — Progress Notes (Signed)
 Speech Language Pathology Treatment: Dysphagia  Patient Details Name: Carolyn Parrish MRN: [id number] DOB: July 22, 1940 Today's Date: 06/06/2023 Time: 4098-1191 SLP Time Calculation (min) (ACUTE ONLY): 10 min  Assessment / Plan / Recommendation Clinical Impression  Per RN, pt has been more lethargic today and less interested in POs. Her dentures were donned and she agreed to a few bites of graham crackers. Mastication and oral clearance appear functional. One instance of delayed coughing was noted following solids. No s/s of aspiration were observed with thin liquids via straw. Per discussion with SLP and pt's daughter previous date, this appears to be pt's baseline diet. Continue Dys 2 solids with thin liquids. Ongoing SLP f/u is not clinically indicated at this time, will sign off.    HPI HPI: Carolyn Parrish is an 83 yo female presenting to ED from [facility] 4/23 with AMS and R gaze deviation. Admitted with status epilepticus in the setting of severe hypoglycemia. Ultimately intubated for airway protection, 4/23 and self-extubated 4/24. PMH includes prior CVA with residual weakness and deficits, dementia, functional decline, breast cancer s/p XRT and mastectomy 2020      SLP Plan  All goals met      Recommendations for follow up therapy are one component of a multi-disciplinary discharge planning process, led by the attending physician.  Recommendations may be updated based on patient status, additional functional criteria and insurance authorization.    Recommendations  Diet recommendations: Dysphagia 2 (fine chop);Thin liquid Liquids provided via: Cup;Straw Medication Administration: Crushed with puree Supervision: Staff to assist with self feeding;Full supervision/cueing for compensatory strategies Compensations: Minimize environmental distractions;Slow rate;Small sips/bites Postural Changes and/or Swallow Maneuvers: Seated upright 90 degrees                  Oral care  BID   Frequent or constant Supervision/Assistance Dysphagia, pharyngeal phase (R13.13)     All goals met     Amil Kale, M.A., CCC-SLP Speech Language Pathology, Acute Rehabilitation Services  Secure Chat preferred [phone]   06/06/2023, 2:10 PM

## 2023-06-07 DIAGNOSIS — R569 Unspecified convulsions: Secondary | ICD-10-CM | POA: Diagnosis not present

## 2023-06-07 LAB — CBC
HCT: 27.8 % — ABNORMAL LOW (ref 36.0–46.0)
Hemoglobin: 8.6 g/dL — ABNORMAL LOW (ref 12.0–15.0)
MCH: 27.2 pg (ref 26.0–34.0)
MCHC: 30.9 g/dL (ref 30.0–36.0)
MCV: 88 fL (ref 80.0–100.0)
Platelets: 386 10*3/uL (ref 150–400)
RBC: 3.16 MIL/uL — ABNORMAL LOW (ref 3.87–5.11)
RDW: 19.3 % — ABNORMAL HIGH (ref 11.5–15.5)
WBC: 10.4 10*3/uL (ref 4.0–10.5)
nRBC: 0 % (ref 0.0–0.2)

## 2023-06-07 LAB — BASIC METABOLIC PANEL WITH GFR
Anion gap: 9 (ref 5–15)
BUN: 22 mg/dL (ref 8–23)
CO2: 26 mmol/L (ref 22–32)
Calcium: 9 mg/dL (ref 8.9–10.3)
Chloride: 106 mmol/L (ref 98–111)
Creatinine, Ser: 0.49 mg/dL (ref 0.44–1.00)
GFR, Estimated: >60 mL/min (ref >60)
Glucose, Bld: 135 mg/dL — ABNORMAL HIGH (ref 70–99)
Potassium: 4.4 mmol/L (ref 3.5–5.1)
Sodium: 141 mmol/L (ref 135–145)

## 2023-06-07 LAB — MAGNESIUM: Magnesium: 2.2 mg/dL (ref 1.7–2.4)

## 2023-06-07 LAB — GLUCOSE, CAPILLARY
Glucose-Capillary: 143 mg/dL — ABNORMAL HIGH (ref 70–99)
Glucose-Capillary: 120 mg/dL — ABNORMAL HIGH (ref 70–99)
Glucose-Capillary: 91 mg/dL (ref 70–99)
Glucose-Capillary: 97 mg/dL (ref 70–99)
Glucose-Capillary: 148 mg/dL — ABNORMAL HIGH (ref 70–99)

## 2023-06-07 LAB — PHOSPHORUS: Phosphorus: 2.9 mg/dL (ref 2.5–4.6)

## 2023-06-07 NOTE — Plan of Care (Signed)
  Problem: Clinical Measurements: Goal: Will remain free from infection Outcome: Progressing   Problem: Clinical Measurements: Goal: Respiratory complications will improve Outcome: Progressing   Problem: Clinical Measurements: Goal: Cardiovascular complication will be avoided Outcome: Progressing   Problem: Activity: Goal: Risk for activity intolerance will decrease Outcome: Progressing   Problem: Nutrition: Goal: Adequate nutrition will be maintained Outcome: Progressing   Problem: Elimination: Goal: Will not experience complications related to bowel motility Outcome: Progressing   Problem: Pain Managment: Goal: General experience of comfort will improve and/or be controlled Outcome: Progressing

## 2023-06-07 NOTE — Progress Notes (Addendum)
 PROGRESS NOTE  Carolyn Parrish:[id number] DOB: 1940/05/28 DOA: 05/30/2023 PCP: Pcp, No   LOS: 8 days   Brief narrative:  Carolyn Parrish is a 83 y.o. female with past medical history of CVA with residual weakness and deficits, dementia, functional decline, breast CA s/p XRT and mastectomy 2020 currently residing at [facility] was initially admitted to the ICU as code stroke.  Patient had seizure-like activity in the setting of severe hypoglycemia.  Self extubated on 05/31/2023.  Patient had low-grade fever, worsening leukocytosis and was on IV vasopressors.  Urine cultures were positive for E. coli.  Subsequently patient was transferred out of the ICU when her blood pressures were stable..       Assessment/Plan: Principal Problem:   Seizure (HCC) Active Problems:   Protein-calorie malnutrition, severe  New onset seizure  suspected secondary to hypoglycemia. No further seizures.  Will continue to monitor closely.  Seizure precautions.  Metabolic encephalopathy.  Awake but minimally interactive.  Aphasic.   Hypoglycemia: On high glycemic protocol.  Monitor oral intake.  Was on nocturnal feeds.  Currently on dysphagia 2 diet.  Latest POC glucose of 91.   Acute respiratory failure with inability to protect airway S/p extubation.  Currently on room air.   Sepsis secondary to E. coli UTI. E.coli bacteremia from blood cultures 05/30/23 Off vasopressors.  Continue IV Rocephin .  Susceptibilities are back.  Transition to Bactrim on discharge.  Temperature max of 98.9 F.  Latest blood pressure of 109/49.  Hypokalemia - resolved.  Latest potassium of 4.4.  Hypocalcemia improved.  Latest calcium  of 9.0.  Hypophosphatemia- improved.  Latest phosphorus of 2.9   Essential hypertension: Monitor blood pressure.  Marginally low at this time.  History of CVA with weakness.  30 years back.   Hyperlipidemia Continue statins.   History of CHF- 2 D echocardiogram within normal  limits, currently compensated.   Severe malnutrition Hypoalbuminemia Body mass index is 21.99 kg/m.  Nutrition Status: Nutrition Problem: Severe Malnutrition Etiology: chronic illness Signs/Symptoms: severe muscle depletion, severe fat depletion Interventions: Tube feeding, Prostat, MVI  Dietitian on board.  If oral intake improved remove cortrak tube.  On dysphagia 2 diet.  On 48-hour calorie count.  On nocturnal feeds.  Deep tissue injury of the medial buttocks.  Present on admission.  Continue pressure ulcer prevention protocol. Pressure Injury 05/31/23 Buttocks Medial Deep Tissue Pressure Injury - Purple or maroon localized area of discolored intact skin or blood-filled blister due to damage of underlying soft tissue from pressure and/or shear. (Active)  05/31/23 0523  Location: Buttocks  Location Orientation: Medial  Staging: Deep Tissue Pressure Injury - Purple or maroon localized area of discolored intact skin or blood-filled blister due to damage of underlying soft tissue from pressure and/or shear.  Wound Description (Comments):   Present on Admission: Yes      DVT prophylaxis: heparin  injection 5,000 Units Start: 05/30/23 2200 SCDs Start: 05/30/23 1047   Disposition: Skilled nursing facility as per PT evaluation.  Patient from skilled nursing facility.  TOC on board.  Status is: Inpatient  Remains inpatient appropriate because: Pending clinical improvement, debility, cortrak tube tube feeding, need for skilled nursing facility placement.    Code Status:     Code Status: Do not attempt resuscitation (DNR) PRE-ARREST INTERVENTIONS DESIRED  Family Communication:  I spoke with the patient's daughter and updated her about the clinical condition of the patient.   Consultants: PCCM  Procedures: Cortrak tube placement  Anti-infectives:  Rocephin  IV  Anti-infectives (  From admission, onward)    Start     Dose/Rate Route Frequency Ordered Stop   06/01/23 1000   cefTRIAXone  (ROCEPHIN ) 2 g in sodium chloride  0.9 % 100 mL IVPB        2 g 200 mL/hr over 30 Minutes Intravenous Every 24 hours 06/01/23 0755 06/07/23 1024       Subjective:  Today, patient was seen and examined at bedside.  Patient appears to be more alert required minimal interaction.  Denies pain and follows some commands like moving her extremities.  Objective: Vitals:   06/07/23 0410 06/07/23 0841  BP: (!) 103/44 (!) 109/49  Pulse: 89 89  Resp: 18 18  Temp: 98.3 F (36.8 C) 98.7 F (37.1 C)  SpO2: 100% 100%    Intake/Output Summary (Last 24 hours) at 06/07/2023 1210 Last data filed at 06/07/2023 0847 Gross per 24 hour  Intake 338.2 ml  Output 650 ml  Net -311.8 ml   Filed Weights   06/05/23 0421 06/06/23 0402 06/07/23 0410  Weight: 67.2 kg 62.5 kg 61.8 kg   Body mass index is 21.99 kg/m.   Physical Exam:  GENERAL: Patient is alert today minimally interactive, appears at baseline, follows commands like moving her extremities.    Appears weak and deconditioned.  Elderly female.  Cortrak tube tube in place. HENT: No scleral pallor or icterus. Pupils equally reactive to light. Oral mucosa is moist NECK: is supple, no gross swelling noted. CHEST: Clear to auscultation. No crackles or wheezes.   CVS: S1 and S2 heard, no murmur. Regular rate and rhythm.  ABDOMEN: Soft, non-tender, bowel sounds are present. EXTREMITIES: No edema. CNS: Weakness of the extremities noted, little more on the right side.Carolyn Parrish  Aphasic.,  Follows commands. SKIN: warm and dry without rashes.  Data Review: I have personally reviewed the following laboratory data and studies,  CBC: Recent Labs  Lab 06/03/23 0432 06/04/23 0815 06/05/23 0458 06/06/23 0819 06/07/23 0500  WBC 10.0 9.6 11.6* 10.6* 10.4  HGB 8.5* 9.3* 9.4* 8.5* 8.6*  HCT 27.8* 30.6* 30.6* 27.4* 27.8*  MCV 88.3 89.5 86.7 87.5 88.0  PLT 214 258 315 366 386   Basic Metabolic Panel: Recent Labs  Lab 06/01/23 0639 06/02/23 0718  06/03/23 0432 06/04/23 0815 06/05/23 0458 06/06/23 0456 06/07/23 0500  NA 140 140 142 144 140 141 141  K 4.1 3.8 3.3* 3.4* 3.6 4.5 4.4  CL 110 107 106 107 105 107 106  CO2 22 28 25 26 24 24 26   GLUCOSE 121* 135* 151* 118* 141* 118* 135*  BUN 14 14 14 18 19 22 22   CREATININE 0.43* 0.32* 0.39* 0.43* 0.43* 0.44 0.49  CALCIUM  8.1* 8.4* 8.8* 9.3 9.1 9.3 9.0  MG 1.8 2.2 2.2 2.2  --   --  2.2  PHOS 2.7 2.1* 1.6* 1.2* 2.7 3.1 2.9   Liver Function Tests: No results for input(s): "AST", "ALT", "ALKPHOS", "BILITOT", "PROT", "ALBUMIN " in the last 168 hours.  No results for input(s): "LIPASE", "AMYLASE" in the last 168 hours. No results for input(s): "AMMONIA" in the last 168 hours. Cardiac Enzymes: No results for input(s): "CKTOTAL", "CKMB", "CKMBINDEX", "TROPONINI" in the last 168 hours. BNP (last 3 results) No results for input(s): "BNP" in the last 8760 hours.  ProBNP (last 3 results) No results for input(s): "PROBNP" in the last 8760 hours.  CBG: Recent Labs  Lab 06/06/23 1936 06/06/23 2307 06/07/23 0408 06/07/23 0836 06/07/23 1124  GLUCAP 178* 132* 143* 120* 91   Recent Results (  from the past 240 hours)  Resp panel by RT-PCR (RSV, Flu A&B, Covid) Anterior Nasal Swab     Status: None   Collection Time: 05/30/23  9:45 AM   Specimen: Anterior Nasal Swab  Result Value Ref Range Status   SARS Coronavirus 2 by RT PCR NEGATIVE NEGATIVE Final   Influenza A by PCR NEGATIVE NEGATIVE Final   Influenza B by PCR NEGATIVE NEGATIVE Final    Comment: (NOTE) The Xpert Xpress SARS-CoV-2/FLU/RSV plus assay is intended as an aid in the diagnosis of influenza from Nasopharyngeal swab specimens and should not be used as a sole basis for treatment. Nasal washings and aspirates are unacceptable for Xpert Xpress SARS-CoV-2/FLU/RSV testing.  Fact Sheet for Patients: BloggerCourse.com  Fact Sheet for Healthcare  Providers: SeriousBroker.it  This test is not yet approved or cleared by the [place]  FDA and has been authorized for detection and/or diagnosis of SARS-CoV-2 by FDA under an Emergency Use Authorization (EUA). This EUA will remain in effect (meaning this test can be used) for the duration of the COVID-19 declaration under Section 564(b)(1) of the Act, 21 U.S.C. section 360bbb-3(b)(1), unless the authorization is terminated or revoked.     Resp Syncytial Virus by PCR NEGATIVE NEGATIVE Final    Comment: (NOTE) Fact Sheet for Patients: BloggerCourse.com  Fact Sheet for Healthcare Providers: SeriousBroker.it  This test is not yet approved or cleared by the [place]  FDA and has been authorized for detection and/or diagnosis of SARS-CoV-2 by FDA under an Emergency Use Authorization (EUA). This EUA will remain in effect (meaning this test can be used) for the duration of the COVID-19 declaration under Section 564(b)(1) of the Act, 21 U.S.C. section 360bbb-3(b)(1), unless the authorization is terminated or revoked.  Performed at [facility], [address] [place], [place] [address]   Urine Culture     Status: Abnormal   Collection Time: 05/30/23  3:03 PM   Specimen: Urine, Catheterized  Result Value Ref Range Status   Specimen Description URINE, CATHETERIZED  Final   Special Requests   Final    Normal Performed at [facility], [address] [place], [place] [address]    Culture >=100,000 COLONIES/mL ESCHERICHIA COLI (A)  Final   Report Status 06/06/2023 FINAL  Final   Organism ID, Bacteria ESCHERICHIA COLI (A)  Final      Susceptibility   Escherichia coli - MIC*    AMPICILLIN >=32 RESISTANT Resistant     CEFAZOLIN  <=4 SENSITIVE Sensitive     CEFEPIME  <=0.12 SENSITIVE Sensitive     CEFTRIAXONE  <=0.25 SENSITIVE Sensitive     CIPROFLOXACIN >=4 RESISTANT Resistant     GENTAMICIN  >=16 RESISTANT Resistant     IMIPENEM <=0.25 SENSITIVE Sensitive     NITROFURANTOIN <=16 SENSITIVE Sensitive     TRIMETH/SULFA <=20 SENSITIVE Sensitive     AMPICILLIN/SULBACTAM 16 INTERMEDIATE Intermediate     PIP/TAZO <=4 SENSITIVE Sensitive ug/mL    * >=100,000 COLONIES/mL ESCHERICHIA COLI  MRSA Next Gen by PCR, Nasal     Status: None   Collection Time: 05/31/23  3:55 AM   Specimen: Nasal Mucosa; Nasal Swab  Result Value Ref Range Status   MRSA by PCR Next Gen NOT DETECTED NOT DETECTED Final    Comment: (NOTE) The GeneXpert MRSA Assay (FDA approved for NASAL specimens only), is one component of a comprehensive MRSA colonization surveillance program. It is not intended to diagnose MRSA infection nor to guide or monitor treatment for MRSA infections. Test  performance is not FDA approved in patients less than 19 years old. Performed at [facility], [address], [place], [place] [address]   Culture, blood (Routine X 2) w Reflex to ID Panel     Status: Abnormal   Collection Time: 06/01/23  9:38 AM   Specimen: BLOOD LEFT HAND  Result Value Ref Range Status   Specimen Description BLOOD LEFT HAND  Final   Special Requests   Final    BOTTLES DRAWN AEROBIC AND ANAEROBIC Blood Culture results may not be optimal due to an inadequate volume of blood received in culture bottles   Culture  Setup Time   Final    GRAM NEGATIVE RODS AEROBIC BOTTLE ONLY CRITICAL RESULT CALLED TO, READ BACK BY AND VERIFIED WITH: V BRYK,PHARMD@[id number]  06/02/23 MK Performed at [facility], [address] [place], [place] [address]    Culture ESCHERICHIA COLI (A)  Final   Report Status 06/05/2023 FINAL  Final   Organism ID, Bacteria ESCHERICHIA COLI  Final   Organism ID, Bacteria ESCHERICHIA COLI  Final      Susceptibility   Escherichia coli - KIRBY BAUER*    CEFAZOLIN  INTERMEDIATE Intermediate    Escherichia coli - MIC*    AMPICILLIN >=32 RESISTANT Resistant     CEFEPIME  <=0.12 SENSITIVE  Sensitive     CEFTAZIDIME <=1 SENSITIVE Sensitive     CEFTRIAXONE  <=0.25 SENSITIVE Sensitive     CIPROFLOXACIN >=4 RESISTANT Resistant     GENTAMICIN >=16 RESISTANT Resistant     IMIPENEM <=0.25 SENSITIVE Sensitive     TRIMETH/SULFA <=20 SENSITIVE Sensitive     AMPICILLIN/SULBACTAM 16 INTERMEDIATE Intermediate     PIP/TAZO <=4 SENSITIVE Sensitive ug/mL    * ESCHERICHIA COLI    ESCHERICHIA COLI  Blood Culture ID Panel (Reflexed)     Status: Abnormal   Collection Time: 06/01/23  9:38 AM  Result Value Ref Range Status   Enterococcus faecalis NOT DETECTED NOT DETECTED Final   Enterococcus Faecium NOT DETECTED NOT DETECTED Final   Listeria monocytogenes NOT DETECTED NOT DETECTED Final   Staphylococcus species NOT DETECTED NOT DETECTED Final   Staphylococcus aureus (BCID) NOT DETECTED NOT DETECTED Final   Staphylococcus epidermidis NOT DETECTED NOT DETECTED Final   Staphylococcus lugdunensis NOT DETECTED NOT DETECTED Final   Streptococcus species NOT DETECTED NOT DETECTED Final   Streptococcus agalactiae NOT DETECTED NOT DETECTED Final   Streptococcus pneumoniae NOT DETECTED NOT DETECTED Final   Streptococcus pyogenes NOT DETECTED NOT DETECTED Final   A.calcoaceticus-baumannii NOT DETECTED NOT DETECTED Final   Bacteroides fragilis NOT DETECTED NOT DETECTED Final   Enterobacterales DETECTED (A) NOT DETECTED Final    Comment: Enterobacterales represent a large order of gram negative bacteria, not a single organism. CRITICAL RESULT CALLED TO, READ BACK BY AND VERIFIED WITH: V BRYK,PHARMD@[email or web address]  06/02/23 MK    Enterobacter cloacae complex NOT DETECTED NOT DETECTED Final   Escherichia coli DETECTED (A) NOT DETECTED Final    Comment: CRITICAL RESULT CALLED TO, READ BACK BY AND VERIFIED WITH: V BRYK,PHARMD@0445  06/02/23 MK    Klebsiella aerogenes NOT DETECTED NOT DETECTED Final   Klebsiella oxytoca NOT DETECTED NOT DETECTED Final   Klebsiella pneumoniae NOT DETECTED NOT DETECTED Final    Proteus species NOT DETECTED NOT DETECTED Final   Salmonella species NOT DETECTED NOT DETECTED Final   Serratia marcescens NOT DETECTED NOT DETECTED Final   Haemophilus influenzae NOT DETECTED NOT DETECTED Final   Neisseria meningitidis NOT DETECTED NOT DETECTED Final   Pseudomonas  aeruginosa NOT DETECTED NOT DETECTED Final   Stenotrophomonas maltophilia NOT DETECTED NOT DETECTED Final   Candida albicans NOT DETECTED NOT DETECTED Final   Candida auris NOT DETECTED NOT DETECTED Final   Candida glabrata NOT DETECTED NOT DETECTED Final   Candida krusei NOT DETECTED NOT DETECTED Final   Candida parapsilosis NOT DETECTED NOT DETECTED Final   Candida tropicalis NOT DETECTED NOT DETECTED Final   Cryptococcus neoformans/gattii NOT DETECTED NOT DETECTED Final   CTX-M ESBL NOT DETECTED NOT DETECTED Final   Carbapenem resistance IMP NOT DETECTED NOT DETECTED Final   Carbapenem resistance KPC NOT DETECTED NOT DETECTED Final   Carbapenem resistance NDM NOT DETECTED NOT DETECTED Final   Carbapenem resist OXA 48 LIKE NOT DETECTED NOT DETECTED Final   Carbapenem resistance VIM NOT DETECTED NOT DETECTED Final    Comment: Performed at [facility], [address], [place], [place] [address]  Culture, blood (Routine X 2) w Reflex to ID Panel     Status: None   Collection Time: 06/01/23  9:43 AM   Specimen: BLOOD RIGHT ARM  Result Value Ref Range Status   Specimen Description BLOOD RIGHT ARM  Final   Special Requests   Final    AEROBIC BOTTLE ONLY Blood Culture results may not be optimal due to an inadequate volume of blood received in culture bottles   Culture   Final    NO GROWTH 5 DAYS Performed at [facility], [address], [place], [place] [address]    Report Status 06/06/2023 FINAL  Final     Studies: No results found.     Allayna Erlich, MD  [facility] 06/07/2023  If 7PM-7AM, please contact night-coverage

## 2023-06-07 NOTE — Progress Notes (Signed)
 Calorie Count Note  48 hour calorie count ordered.  Diet: Dysphagia 2, Thin Liquids Supplements: Ensure Enlive - BID  Estimated Nutritional Needs:  Kcal:  1500-1700 Protein:  95-105 grams Fluid:  >1.5 L/day  Day 1 - 06/05/23 Lunch: 191 calories, 11 gm protein Dinner: 154 calories, 1 gm protein Supplements: None Documented   Total intake: 345 kcal (23% of minimum estimated needs)  12 protein (13% of minimum estimated needs)  Day 2 - 06/06/23 Breakfast: 242 calories, 7 gm protein Lunch: 0 calories, 0 gm protein Dinner: 345 calories, 19 gm protein Supplements: None Documented  Total intake: 587 kcal (39% of minimum estimated needs)  26 protein (27% of minimum estimated needs)  Nutrition Diagnosis: Severe Malnutrition related to chronic illness as evidenced by severe muscle depletion, severe fat depletion.   Goal: Patient will meet greater than or equal to 90% of their needs   Intervention:  Ensure Enlive po BID, each supplement provides 350 kcal and 20 grams of protein. Continue nocturnal TF via Cortrak:    Doneta Furbish RD, LDN Clinical Dietitian

## 2023-06-07 NOTE — Plan of Care (Signed)
   Problem: Education: Goal: Knowledge of General Education information will improve Description Including pain rating scale, medication(s)/side effects and non-pharmacologic comfort measures Outcome: Progressing

## 2023-06-07 NOTE — TOC Progression Note (Signed)
 Transition of Care [facility]) - Progression Note    Patient Details  Name: Carolyn Parrish MRN: [id number] Date of Birth: Sep 13, 1940  [facility] CM/SW Contact  Carmon Christen, LCSWA Phone Number: 06/07/2023, 3:39 PM  Clinical Narrative:     CSW following to start insurance authorization for SNF closer to patient being medically stable for dc.   Expected Discharge Plan: Skilled Nursing Facility Barriers to Discharge: Continued Medical Work up  Expected Discharge Plan and Services In-house Referral: Clinical Social Work   Post Acute Care Choice: Skilled Nursing Facility Living arrangements for the past 2 months: Skilled Nursing Facility                                       Social Determinants of Health (SDOH) Interventions SDOH Screenings   Food Insecurity: Patient Unable To Answer (06/01/2023)  Housing: Patient Unable To Answer (06/01/2023)  Transportation Needs: Patient Unable To Answer (06/01/2023)  Utilities: Patient Unable To Answer (06/01/2023)  Depression (PHQ2-9): Medium Risk (12/03/2018)  Financial Resource Strain: Low Risk  (01/15/2018)  Physical Activity: Inactive (01/15/2018)  Social Connections: Unknown (06/01/2023)  Stress: Stress Concern Present (01/15/2018)  Tobacco Use: Low Risk  (05/30/2023)    Readmission Risk Interventions     No data to display

## 2023-06-08 DIAGNOSIS — R569 Unspecified convulsions: Secondary | ICD-10-CM | POA: Diagnosis not present

## 2023-06-08 LAB — GLUCOSE, CAPILLARY
Glucose-Capillary: 113 mg/dL — ABNORMAL HIGH (ref 70–99)
Glucose-Capillary: 116 mg/dL — ABNORMAL HIGH (ref 70–99)
Glucose-Capillary: 118 mg/dL — ABNORMAL HIGH (ref 70–99)
Glucose-Capillary: 134 mg/dL — ABNORMAL HIGH (ref 70–99)
Glucose-Capillary: 136 mg/dL — ABNORMAL HIGH (ref 70–99)
Glucose-Capillary: 139 mg/dL — ABNORMAL HIGH (ref 70–99)
Glucose-Capillary: 150 mg/dL — ABNORMAL HIGH (ref 70–99)

## 2023-06-08 NOTE — Progress Notes (Signed)
 Calorie Count Note  48 hour calorie count ordered.  Diet: Dysphagia 2, Thin Liquids Supplements: Ensure Enlive - BID  Estimated Nutritional Needs:  Kcal:  1500-1700 Protein:  95-105 grams Fluid:  >1.5 L/day  Day 3 - 06/07/23 Breakfast: 35 calories, 1 gm protein Lunch: 591 calories, 21 gm protein Dinner: No Documentation Supplements: No Documented   Total intake: 626 kcal (42% of minimum estimated needs)  22 protein (23% of minimum estimated needs)   Nutrition Diagnosis: Severe Malnutrition related to chronic illness as evidenced by severe muscle depletion, severe fat depletion.   Goal: Patient will meet greater than or equal to 90% of their needs   Intervention:  Ensure Enlive po BID, each supplement provides 350 kcal and 20 grams of protein. Continue nocturnal TF via Cortrak     Doneta Furbish RD, LDN Clinical Dietitian

## 2023-06-08 NOTE — TOC Progression Note (Signed)
 Transition of Care [facility]) - Progression Note    Patient Details  Name: Carolyn Parrish MRN: [id number] Date of Birth: 01-23-1941  [facility]) CM/SW Contact  Carmon Christen, LCSWA Phone Number: 06/08/2023, 11:09 AM  Clinical Narrative:     Patient has SN[facility] bed at [facility] when medically stable. CSW following to start insurance authorization closer to patient being medically stable for dc.  Expected Discharge Plan: Skilled Nursing Facility Barriers to Discharge: Continued Medical Work up  Expected Discharge Plan and Services In-house Referral: Clinical Social Work   Post Acute Care Choice: Skilled Nursing Facility Living arrangements for the past 2 months: Skilled Nursing Facility                                       Social Determinants of Health (SDOH) Interventions SDOH Screenings   Food Insecurity: Patient Unable To Answer (06/01/2023)  Housing: Patient Unable To Answer (06/01/2023)  Transportation Needs: Patient Unable To Answer (06/01/2023)  Utilities: Patient Unable To Answer (06/01/2023)  Depression (PHQ2-9): Medium Risk (12/03/2018)  Financial Resource Strain: Low Risk  (01/15/2018)  Physical Activity: Inactive (01/15/2018)  Social Connections: Unknown (06/01/2023)  Stress: Stress Concern Present (01/15/2018)  Tobacco Use: Low Risk  (05/30/2023)    Readmission Risk Interventions     No data to display

## 2023-06-08 NOTE — Plan of Care (Signed)
   Problem: Activity: Goal: Risk for activity intolerance will decrease Outcome: Progressing   Problem: Nutrition: Goal: Adequate nutrition will be maintained Outcome: Progressing   Problem: Elimination: Goal: Will not experience complications related to bowel motility Outcome: Progressing   Problem: Pain Managment: Goal: General experience of comfort will improve and/or be controlled Outcome: Progressing   Problem: Safety: Goal: Ability to remain free from injury will improve Outcome: Progressing   Problem: Skin Integrity: Goal: Risk for impaired skin integrity will decrease Outcome: Progressing

## 2023-06-08 NOTE — Plan of Care (Signed)
   Problem: Education: Goal: Knowledge of General Education information will improve Description Including pain rating scale, medication(s)/side effects and non-pharmacologic comfort measures Outcome: Progressing

## 2023-06-08 NOTE — Progress Notes (Addendum)
 PROGRESS NOTE  Carolyn Parrish ZOX:[id number] DOB: 01-13-1941 DOA: 05/30/2023 PCP: Pcp, No   LOS: 9 days   Brief narrative:  Carolyn Parrish is a 83 y.o. female with past medical history of CVA with residual weakness and deficits, dementia, functional decline, breast CA s/p XRT and mastectomy 2020 currently residing at [facility] was initially admitted to the ICU as code stroke.  Patient had seizure-like activity in the setting of severe hypoglycemia.  Self extubated on 05/31/2023.  Patient had low-grade fever, worsening leukocytosis and was on IV vasopressors.  Urine cultures were positive for E. coli.  Subsequently patient was transferred out of the ICU when her blood pressures were stable..      Assessment/Plan: Principal Problem:   Seizure (HCC) Active Problems:   Protein-calorie malnutrition, severe  New onset seizure  suspected secondary to hypoglycemia. No further seizures.  Will continue to monitor closely.  Seizure precautions.  Metabolic encephalopathy.  Awake and more interactive today.  Oriented to place.  Has aphasia.   Hypoglycemia: On high glycemic protocol.  Monitor oral intake.  Was on nocturnal feeds.  Currently on dysphagia 2 diet.  Latest POC glucose of 91.   Acute respiratory failure with inability to protect airway S/p extubation.  Currently on room air.   Sepsis secondary to E. coli UTI. E.coli bacteremia from blood cultures 05/30/23 Off vasopressors.  Completed course of IV Rocephin ..   Temperature max of 99.5 F.  Latest blood pressure of 103/47.  Hypokalemia - resolved.  Latest potassium of 4.4.  Hypocalcemia improved.  Latest calcium  of 9.0.  Hypophosphatemia- improved.  Latest phosphorus of 2.9   Essential hypertension: Monitor blood pressure.  Marginally low at this time.  History of CVA with residual weakness.  Will need skilled nursing facility placement.   Hyperlipidemia Continue statins.   History of CHF- 2 D echocardiogram within  normal limits, currently compensated.   Severe malnutrition Hypoalbuminemia Body mass index is 22.13 kg/m.  Nutrition Status: Nutrition Problem: Severe Malnutrition Etiology: chronic illness Signs/Symptoms: severe muscle depletion, severe fat depletion Interventions: Tube feeding, Prostat, MVI  Dietitian on board.  If oral intake improved remove cortrak tube.  On dysphagia 2 diet.  On 48-hour calorie count.  On nocturnal feeds.  Deep tissue injury of the medial buttocks.  Present on admission.  Continue pressure ulcer prevention protocol. Pressure Injury 05/31/23 Buttocks Medial Deep Tissue Pressure Injury - Purple or maroon localized area of discolored intact skin or blood-filled blister due to damage of underlying soft tissue from pressure and/or shear. (Active)  05/31/23 0523  Location: Buttocks  Location Orientation: Medial  Staging: Deep Tissue Pressure Injury - Purple or maroon localized area of discolored intact skin or blood-filled blister due to damage of underlying soft tissue from pressure and/or shear.  Wound Description (Comments):   Present on Admission: Yes       DVT prophylaxis: heparin  injection 5,000 Units Start: 05/30/23 2200 SCDs Start: 05/30/23 1047   Disposition: Skilled nursing facility as per PT evaluation.  Patient from skilled nursing facility.  TOC on board.  Status is: Inpatient  Remains inpatient appropriate because: Pending clinical improvement, debility, cortrak tube tube feeding, need for skilled nursing facility placement.    Code Status:     Code Status: Do not attempt resuscitation (DNR) PRE-ARREST INTERVENTIONS DESIRED  Family Communication: Spoke with the patient's daughter on the phone 06-08-23  Consultants: PCCM  Procedures: Cortrak tube placement  Anti-infectives:  Rocephin  IV  Anti-infectives (From admission, onward)  Start     Dose/Rate Route Frequency Ordered Stop   06/01/23 1000  cefTRIAXone  (ROCEPHIN ) 2 g in sodium  chloride 0.9 % 100 mL IVPB        2 g 200 mL/hr over 30 Minutes Intravenous Every 24 hours 06/01/23 0755 06/07/23 1024       Subjective: Today, patient was seen and examined at bedside.  Mildly interactive.  Denies any pain.  States that she feels okay.  Objective: Vitals:   06/08/23 0334 06/08/23 0734  BP: (!) 100/48 (!) 103/47  Pulse: 84   Resp: 18   Temp: 99 F (37.2 C) 99.5 F (37.5 C)  SpO2: 100%     Intake/Output Summary (Last 24 hours) at 06/08/2023 1038 Last data filed at 06/08/2023 1610 Gross per 24 hour  Intake 752 ml  Output 650 ml  Net 102 ml   Filed Weights   06/06/23 0402 06/07/23 0410 06/08/23 0334  Weight: 62.5 kg 61.8 kg 62.2 kg   Body mass index is 22.13 kg/m.   Physical Exam:  GENERAL: Patient is alert awake and interactive, following commands, appears weak and deconditioned.  Elderly female.  Cortrak tube tube in place. HENT: No scleral pallor or icterus. Pupils equally reactive to light. Oral mucosa is moist NECK: is supple, no gross swelling noted. CHEST: Clear to auscultation. No crackles or wheezes.   CVS: S1 and S2 heard, no murmur. Regular rate and rhythm.  ABDOMEN: Soft, non-tender, bowel sounds are present. EXTREMITIES: No edema.  Right upper extremity mittens with right-sided weakness. CNS: Weakness of the extremities noted, right-sided weakness more than the left.  Moving some extremities.  Aphasic.,  Follows commands. SKIN: warm and dry without rashes.  Data Review: I have personally reviewed the following laboratory data and studies,  CBC: Recent Labs  Lab 06/03/23 0432 06/04/23 0815 06/05/23 0458 06/06/23 0819 06/07/23 0500  WBC 10.0 9.6 11.6* 10.6* 10.4  HGB 8.5* 9.3* 9.4* 8.5* 8.6*  HCT 27.8* 30.6* 30.6* 27.4* 27.8*  MCV 88.3 89.5 86.7 87.5 88.0  PLT 214 258 315 366 386   Basic Metabolic Panel: Recent Labs  Lab 06/02/23 0718 06/03/23 0432 06/04/23 0815 06/05/23 0458 06/06/23 0456 06/07/23 0500  NA 140 142 144 140  141 141  K 3.8 3.3* 3.4* 3.6 4.5 4.4  CL 107 106 107 105 107 106  CO2 28 25 26 24 24 26   GLUCOSE 135* 151* 118* 141* 118* 135*  BUN 14 14 18 19 22 22   CREATININE 0.32* 0.39* 0.43* 0.43* 0.44 0.49  CALCIUM  8.4* 8.8* 9.3 9.1 9.3 9.0  MG 2.2 2.2 2.2  --   --  2.2  PHOS 2.1* 1.6* 1.2* 2.7 3.1 2.9   Liver Function Tests: No results for input(s): "AST", "ALT", "ALKPHOS", "BILITOT", "PROT", "ALBUMIN " in the last 168 hours.  No results for input(s): "LIPASE", "AMYLASE" in the last 168 hours. No results for input(s): "AMMONIA" in the last 168 hours. Cardiac Enzymes: No results for input(s): "CKTOTAL", "CKMB", "CKMBINDEX", "TROPONINI" in the last 168 hours. BNP (last 3 results) No results for input(s): "BNP" in the last 8760 hours.  ProBNP (last 3 results) No results for input(s): "PROBNP" in the last 8760 hours.  CBG: Recent Labs  Lab 06/07/23 1650 06/07/23 2001 06/08/23 0031 06/08/23 0333 06/08/23 0728  GLUCAP 97 148* 136* 150* 134*   Recent Results (from the past 240 hours)  Resp panel by RT-PCR (RSV, Flu A&B, Covid) Anterior Nasal Swab     Status: None  Collection Time: 05/30/23  9:45 AM   Specimen: Anterior Nasal Swab  Result Value Ref Range Status   SARS Coronavirus 2 by RT PCR NEGATIVE NEGATIVE Final   Influenza A by PCR NEGATIVE NEGATIVE Final   Influenza B by PCR NEGATIVE NEGATIVE Final    Comment: (NOTE) The Xpert Xpress SARS-CoV-2/FLU/RSV plus assay is intended as an aid in the diagnosis of influenza from Nasopharyngeal swab specimens and should not be used as a sole basis for treatment. Nasal washings and aspirates are unacceptable for Xpert Xpress SARS-CoV-2/FLU/RSV testing.  Fact Sheet for Patients: BloggerCourse.com  Fact Sheet for Healthcare Providers: SeriousBroker.it  This test is not yet approved or cleared by the [place]  FDA and has been authorized for detection and/or diagnosis of SARS-CoV-2  by FDA under an Emergency Use Authorization (EUA). This EUA will remain in effect (meaning this test can be used) for the duration of the COVID-19 declaration under Section 564(b)(1) of the Act, 21 U.S.C. section 360bbb-3(b)(1), unless the authorization is terminated or revoked.     Resp Syncytial Virus by PCR NEGATIVE NEGATIVE Final    Comment: (NOTE) Fact Sheet for Patients: BloggerCourse.com  Fact Sheet for Healthcare Providers: SeriousBroker.it  This test is not yet approved or cleared by the [place]  FDA and has been authorized for detection and/or diagnosis of SARS-CoV-2 by FDA under an Emergency Use Authorization (EUA). This EUA will remain in effect (meaning this test can be used) for the duration of the COVID-19 declaration under Section 564(b)(1) of the Act, 21 U.S.C. section 360bbb-3(b)(1), unless the authorization is terminated or revoked.  Performed at [facility], [address] [place], [place] [address]   Urine Culture     Status: Abnormal   Collection Time: 05/30/23  3:03 PM   Specimen: Urine, Catheterized  Result Value Ref Range Status   Specimen Description URINE, CATHETERIZED  Final   Special Requests   Final    Normal Performed at [facility], [address], [place], [place] [address]    Culture >=100,000 COLONIES/mL ESCHERICHIA COLI (A)  Final   Report Status 06/06/2023 FINAL  Final   Organism ID, Bacteria ESCHERICHIA COLI (A)  Final      Susceptibility   Escherichia coli - MIC*    AMPICILLIN >=32 RESISTANT Resistant     CEFAZOLIN  <=4 SENSITIVE Sensitive     CEFEPIME  <=0.12 SENSITIVE Sensitive     CEFTRIAXONE  <=0.25 SENSITIVE Sensitive     CIPROFLOXACIN >=4 RESISTANT Resistant     GENTAMICIN >=16 RESISTANT Resistant     IMIPENEM <=0.25 SENSITIVE Sensitive     NITROFURANTOIN <=16 SENSITIVE Sensitive     TRIMETH/SULFA <=20 SENSITIVE Sensitive     AMPICILLIN/SULBACTAM 16  INTERMEDIATE Intermediate     PIP/TAZO <=4 SENSITIVE Sensitive ug/mL    * >=100,000 COLONIES/mL ESCHERICHIA COLI  MRSA Next Gen by PCR, Nasal     Status: None   Collection Time: 05/31/23  3:55 AM   Specimen: Nasal Mucosa; Nasal Swab  Result Value Ref Range Status   MRSA by PCR Next Gen NOT DETECTED NOT DETECTED Final    Comment: (NOTE) The GeneXpert MRSA Assay (FDA approved for NASAL specimens only), is one component of a comprehensive MRSA colonization surveillance program. It is not intended to diagnose MRSA infection nor to guide or monitor treatment for MRSA infections. Test performance is not FDA approved in patients less than 37 years old. Performed at [facility], [address], [place], [place] [address]  Culture, blood (Routine X 2) w Reflex to ID Panel     Status: Abnormal   Collection Time: 06/01/23  9:38 AM   Specimen: BLOOD LEFT HAND  Result Value Ref Range Status   Specimen Description BLOOD LEFT HAND  Final   Special Requests   Final    BOTTLES DRAWN AEROBIC AND ANAEROBIC Blood Culture results may not be optimal due to an inadequate volume of blood received in culture bottles   Culture  Setup Time   Final    GRAM NEGATIVE RODS AEROBIC BOTTLE ONLY CRITICAL RESULT CALLED TO, READ BACK BY AND VERIFIED WITH: V BRYK,PHARMD@[id number]  06/02/23 MK Performed at [facility], [address] [place], [place] [address]    Culture ESCHERICHIA COLI (A)  Final   Report Status 06/05/2023 FINAL  Final   Organism ID, Bacteria ESCHERICHIA COLI  Final   Organism ID, Bacteria ESCHERICHIA COLI  Final      Susceptibility   Escherichia coli - KIRBY BAUER*    CEFAZOLIN  INTERMEDIATE Intermediate    Escherichia coli - MIC*    AMPICILLIN >=32 RESISTANT Resistant     CEFEPIME  <=0.12 SENSITIVE Sensitive     CEFTAZIDIME <=1 SENSITIVE Sensitive     CEFTRIAXONE  <=0.25 SENSITIVE Sensitive     CIPROFLOXACIN >=4 RESISTANT Resistant     GENTAMICIN >=16 RESISTANT Resistant      IMIPENEM <=0.25 SENSITIVE Sensitive     TRIMETH/SULFA <=20 SENSITIVE Sensitive     AMPICILLIN/SULBACTAM 16 INTERMEDIATE Intermediate     PIP/TAZO <=4 SENSITIVE Sensitive ug/mL    * ESCHERICHIA COLI    ESCHERICHIA COLI  Blood Culture ID Panel (Reflexed)     Status: Abnormal   Collection Time: 06/01/23  9:38 AM  Result Value Ref Range Status   Enterococcus faecalis NOT DETECTED NOT DETECTED Final   Enterococcus Faecium NOT DETECTED NOT DETECTED Final   Listeria monocytogenes NOT DETECTED NOT DETECTED Final   Staphylococcus species NOT DETECTED NOT DETECTED Final   Staphylococcus aureus (BCID) NOT DETECTED NOT DETECTED Final   Staphylococcus epidermidis NOT DETECTED NOT DETECTED Final   Staphylococcus lugdunensis NOT DETECTED NOT DETECTED Final   Streptococcus species NOT DETECTED NOT DETECTED Final   Streptococcus agalactiae NOT DETECTED NOT DETECTED Final   Streptococcus pneumoniae NOT DETECTED NOT DETECTED Final   Streptococcus pyogenes NOT DETECTED NOT DETECTED Final   A.calcoaceticus-baumannii NOT DETECTED NOT DETECTED Final   Bacteroides fragilis NOT DETECTED NOT DETECTED Final   Enterobacterales DETECTED (A) NOT DETECTED Final    Comment: Enterobacterales represent a large order of gram negative bacteria, not a single organism. CRITICAL RESULT CALLED TO, READ BACK BY AND VERIFIED WITH: V BRYK,PHARMD@0445  06/02/23 MK    Enterobacter cloacae complex NOT DETECTED NOT DETECTED Final   Escherichia coli DETECTED (A) NOT DETECTED Final    Comment: CRITICAL RESULT CALLED TO, READ BACK BY AND VERIFIED WITH: V BRYK,PHARMD@0445  06/02/23 MK    Klebsiella aerogenes NOT DETECTED NOT DETECTED Final   Klebsiella oxytoca NOT DETECTED NOT DETECTED Final   Klebsiella pneumoniae NOT DETECTED NOT DETECTED Final   Proteus species NOT DETECTED NOT DETECTED Final   Salmonella species NOT DETECTED NOT DETECTED Final   Serratia marcescens NOT DETECTED NOT DETECTED Final   Haemophilus influenzae NOT  DETECTED NOT DETECTED Final   Neisseria meningitidis NOT DETECTED NOT DETECTED Final   Pseudomonas aeruginosa NOT DETECTED NOT DETECTED Final   Stenotrophomonas maltophilia NOT DETECTED NOT DETECTED Final   Candida albicans NOT DETECTED NOT DETECTED Final   Candida  auris NOT DETECTED NOT DETECTED Final   Candida glabrata NOT DETECTED NOT DETECTED Final   Candida krusei NOT DETECTED NOT DETECTED Final   Candida parapsilosis NOT DETECTED NOT DETECTED Final   Candida tropicalis NOT DETECTED NOT DETECTED Final   Cryptococcus neoformans/gattii NOT DETECTED NOT DETECTED Final   CTX-M ESBL NOT DETECTED NOT DETECTED Final   Carbapenem resistance IMP NOT DETECTED NOT DETECTED Final   Carbapenem resistance KPC NOT DETECTED NOT DETECTED Final   Carbapenem resistance NDM NOT DETECTED NOT DETECTED Final   Carbapenem resist OXA 48 LIKE NOT DETECTED NOT DETECTED Final   Carbapenem resistance VIM NOT DETECTED NOT DETECTED Final    Comment: Performed at [facility], [address], [place], [place] [address]  Culture, blood (Routine X 2) w Reflex to ID Panel     Status: None   Collection Time: 06/01/23  9:43 AM   Specimen: BLOOD RIGHT ARM  Result Value Ref Range Status   Specimen Description BLOOD RIGHT ARM  Final   Special Requests   Final    AEROBIC BOTTLE ONLY Blood Culture results may not be optimal due to an inadequate volume of blood received in culture bottles   Culture   Final    NO GROWTH 5 DAYS Performed at [facility], [address] [place], [place] [address]    Report Status 06/06/2023 FINAL  Final     Studies: No results found.     Yarrow Linhart, MD  [facility] 06/08/2023  If 7PM-7AM, please contact night-coverage

## 2023-06-09 DIAGNOSIS — R569 Unspecified convulsions: Secondary | ICD-10-CM | POA: Diagnosis not present

## 2023-06-09 LAB — GLUCOSE, CAPILLARY
Glucose-Capillary: 105 mg/dL — ABNORMAL HIGH (ref 70–99)
Glucose-Capillary: 133 mg/dL — ABNORMAL HIGH (ref 70–99)
Glucose-Capillary: 148 mg/dL — ABNORMAL HIGH (ref 70–99)
Glucose-Capillary: 113 mg/dL — ABNORMAL HIGH (ref 70–99)
Glucose-Capillary: 124 mg/dL — ABNORMAL HIGH (ref 70–99)

## 2023-06-09 NOTE — Progress Notes (Signed)
 PROGRESS NOTE  Carolyn Parrish DGU:[id number] DOB: [birth year] DOA: 05/30/2023 PCP: Pcp, No   LOS: 10 days   Brief narrative:  Carolyn Parrish is a 83 y.o. female with past medical history of CVA with residual weakness and deficits, dementia, functional decline, breast CA s/p XRT and mastectomy 2020 currently residing at [facility] was initially admitted to the ICU as code stroke.  Patient had seizure-like activity in the setting of severe hypoglycemia.  Self extubated on 05/31/2023.  Patient had low-grade fever, worsening leukocytosis and was on IV vasopressors.  Urine cultures were positive for E. coli.  Subsequently patient was transferred out of the ICU when her blood pressures were stable..      Assessment/Plan: Principal Problem:   Seizure (HCC) Active Problems:   Protein-calorie malnutrition, severe  New onset seizure  suspected secondary to hypoglycemia. No further seizures.  Will continue to monitor closely.  Seizure precautions.  Metabolic encephalopathy.  Awake and more interactive today.  Oriented to place.  Has aphasia.   Hypoglycemia: On high glycemic protocol.  Monitor oral intake.  Was on nocturnal feeds.  Currently on dysphagia 2 diet.  Latest POC glucose of 91.   Acute respiratory failure with inability to protect airway S/p extubation.  Currently on room air.   Sepsis secondary to E. coli UTI. E.coli bacteremia from blood cultures 05/30/23 Off vasopressors.  Completed course of IV Rocephin ..   Temperature max of 99.5 F.  Latest blood pressure of 103/47.  Hypokalemia - resolved.  Latest potassium of 4.4.  Hypocalcemia improved.  Latest calcium  of 9.0.  Hypophosphatemia- improved.  Latest phosphorus of 2.9   Essential hypertension: Monitor blood pressure.  Marginally low at this time.  History of CVA with residual weakness.  Will need skilled nursing facility placement.   Hyperlipidemia Continue statins.   History of CHF- 2 D echocardiogram within  normal limits, currently compensated.   Severe malnutrition Hypoalbuminemia Body mass index is 21.99 kg/m.  Nutrition Status: Nutrition Problem: Severe Malnutrition Etiology: chronic illness Signs/Symptoms: severe muscle depletion, severe fat depletion Interventions: Tube feeding, Prostat, MVI  Dietitian on board.  If oral intake improved remove cortrak tube.  On dysphagia 2 diet. Received 48-hour calorie count with decreased calorie intake.  Spoke with the patient's daughter on the phone and updated about it.  At this time she seems to think that letting her go through the natural course would be best and is not inclined towards putting a feeding tube.  Patient would not want a feeding tube as well..  On nocturnal feeds.  Deep tissue injury of the medial buttocks.  Present on admission.  Continue pressure ulcer prevention protocol.  Pressure Injury 05/31/23 Buttocks Medial Deep Tissue Pressure Injury - Purple or maroon localized area of discolored intact skin or blood-filled blister due to damage of underlying soft tissue from pressure and/or shear. (Active)  05/31/23 0523  Location: Buttocks  Location Orientation: Medial  Staging: Deep Tissue Pressure Injury - Purple or maroon localized area of discolored intact skin or blood-filled blister due to damage of underlying soft tissue from pressure and/or shear.  Wound Description (Comments):   Present on Admission: Yes      DVT prophylaxis: heparin  injection 5,000 Units Start: 05/30/23 2200 SCDs Start: 05/30/23 1047   Disposition: Skilled nursing facility as per PT evaluation.  Patient from skilled nursing facility.  TOC on board.  Status is: Inpatient  Remains inpatient appropriate because: Pending clinical improvement, debility, cortrak tube tube feeding, need for skilled nursing  facility placement.    Code Status:     Code Status: Do not attempt resuscitation (DNR) PRE-ARREST INTERVENTIONS DESIRED  Family Communication: Spoke  with the patient's daughter on the phone 06-09-23 and updated HAART about the clinical course. Consultants: PCCM  Procedures: Cortrak tube placement  Anti-infectives:  Rocephin  IV  Anti-infectives (From admission, onward)    Start     Dose/Rate Route Frequency Ordered Stop   06/01/23 1000  cefTRIAXone  (ROCEPHIN ) 2 g in sodium chloride  0.9 % 100 mL IVPB        2 g 200 mL/hr over 30 Minutes Intravenous Every 24 hours 06/01/23 0755 06/07/23 1024       Subjective: Today, patient was seen and examined at bedside.   States that she feels okay.  Oriented to place and month.  Denies pain, nausea, vomiting or fever.  Objective: Vitals:   06/08/23 2339 06/09/23 0337  BP: (!) 112/47 (!) 102/53  Pulse: 93 85  Resp: 15 17  Temp: 98.8 F (37.1 C) 98.5 F (36.9 C)  SpO2: 100% 100%    Intake/Output Summary (Last 24 hours) at 06/09/2023 1038 Last data filed at 06/09/2023 1000 Gross per 24 hour  Intake 2035 ml  Output 1650 ml  Net 385 ml   Filed Weights   06/07/23 0410 06/08/23 0334 06/09/23 0451  Weight: 61.8 kg 62.2 kg 61.8 kg   Body mass index is 21.99 kg/m.   Physical Exam:  GENERAL: Patient is alert awake and interactive, following commands, appears weak and deconditioned.  Elderly female.  Cortrak tube tube in place. HENT: No scleral pallor or icterus. Pupils equally reactive to light. Oral mucosa is moist NECK: is supple, no gross swelling noted. CHEST: Clear to auscultation. No crackles or wheezes.   CVS: S1 and S2 heard, no murmur. Regular rate and rhythm.  ABDOMEN: Soft, non-tender, bowel sounds are present. EXTREMITIES: No edema.  Right-sided weakness. CNS: Weakness of the extremities noted, right-sided weakness more than the left.  Moving some extremities.   Follows commands. SKIN: warm and dry without rashes.  Data Review: I have personally reviewed the following laboratory data and studies,  CBC: Recent Labs  Lab 06/03/23 0432 06/04/23 0815 06/05/23 0458  06/06/23 0819 06/07/23 0500  WBC 10.0 9.6 11.6* 10.6* 10.4  HGB 8.5* 9.3* 9.4* 8.5* 8.6*  HCT 27.8* 30.6* 30.6* 27.4* 27.8*  MCV 88.3 89.5 86.7 87.5 88.0  PLT 214 258 315 366 386   Basic Metabolic Panel: Recent Labs  Lab 06/03/23 0432 06/04/23 0815 06/05/23 0458 06/06/23 0456 06/07/23 0500  NA 142 144 140 141 141  K 3.3* 3.4* 3.6 4.5 4.4  CL 106 107 105 107 106  CO2 25 26 24 24 26   GLUCOSE 151* 118* 141* 118* 135*  BUN 14 18 19 22 22   CREATININE 0.39* 0.43* 0.43* 0.44 0.49  CALCIUM  8.8* 9.3 9.1 9.3 9.0  MG 2.2 2.2  --   --  2.2  PHOS 1.6* 1.2* 2.7 3.1 2.9   Liver Function Tests: No results for input(s): "AST", "ALT", "ALKPHOS", "BILITOT", "PROT", "ALBUMIN " in the last 168 hours.  No results for input(s): "LIPASE", "AMYLASE" in the last 168 hours. No results for input(s): "AMMONIA" in the last 168 hours. Cardiac Enzymes: No results for input(s): "CKTOTAL", "CKMB", "CKMBINDEX", "TROPONINI" in the last 168 hours. BNP (last 3 results) No results for input(s): "BNP" in the last 8760 hours.  ProBNP (last 3 results) No results for input(s): "PROBNP" in the last 8760 hours.  CBG: Recent  Labs  Lab 06/08/23 1622 06/08/23 2015 06/08/23 2338 06/09/23 0401 06/09/23 0718  GLUCAP 118* 116* 139* 105* 133*   Recent Results (from the past 240 hours)  Urine Culture     Status: Abnormal   Collection Time: 05/30/23  3:03 PM   Specimen: Urine, Catheterized  Result Value Ref Range Status   Specimen Description URINE, CATHETERIZED  Final   Special Requests   Final    Normal Performed at [facility], [address], [place], [place] [address]    Culture >=100,000 COLONIES/mL ESCHERICHIA COLI (A)  Final   Report Status 06/06/2023 FINAL  Final   Organism ID, Bacteria ESCHERICHIA COLI (A)  Final      Susceptibility   Escherichia coli - MIC*    AMPICILLIN >=32 RESISTANT Resistant     CEFAZOLIN  <=4 SENSITIVE Sensitive     CEFEPIME  <=0.12 SENSITIVE Sensitive      CEFTRIAXONE  <=0.25 SENSITIVE Sensitive     CIPROFLOXACIN >=4 RESISTANT Resistant     GENTAMICIN >=16 RESISTANT Resistant     IMIPENEM <=0.25 SENSITIVE Sensitive     NITROFURANTOIN <=16 SENSITIVE Sensitive     TRIMETH/SULFA <=20 SENSITIVE Sensitive     AMPICILLIN/SULBACTAM 16 INTERMEDIATE Intermediate     PIP/TAZO <=4 SENSITIVE Sensitive ug/mL    * >=100,000 COLONIES/mL ESCHERICHIA COLI  MRSA Next Gen by PCR, Nasal     Status: None   Collection Time: 05/31/23  3:55 AM   Specimen: Nasal Mucosa; Nasal Swab  Result Value Ref Range Status   MRSA by PCR Next Gen NOT DETECTED NOT DETECTED Final    Comment: (NOTE) The GeneXpert MRSA Assay (FDA approved for NASAL specimens only), is one component of a comprehensive MRSA colonization surveillance program. It is not intended to diagnose MRSA infection nor to guide or monitor treatment for MRSA infections. Test performance is not FDA approved in patients less than 60 years old. Performed at [facility], [address], [place], [place] [address]   Culture, blood (Routine X 2) w Reflex to ID Panel     Status: Abnormal   Collection Time: 06/01/23  9:38 AM   Specimen: BLOOD LEFT HAND  Result Value Ref Range Status   Specimen Description BLOOD LEFT HAND  Final   Special Requests   Final    BOTTLES DRAWN AEROBIC AND ANAEROBIC Blood Culture results may not be optimal due to an inadequate volume of blood received in culture bottles   Culture  Setup Time   Final    GRAM NEGATIVE RODS AEROBIC BOTTLE ONLY CRITICAL RESULT CALLED TO, READ BACK BY AND VERIFIED WITH: V BRYK,PHARMD@[email or web address]  06/02/23 MK Performed at [facility], [address], [place], [place] [address]    Culture ESCHERICHIA COLI (A)  Final   Report Status 06/05/2023 FINAL  Final   Organism ID, Bacteria ESCHERICHIA COLI  Final   Organism ID, Bacteria ESCHERICHIA COLI  Final      Susceptibility   Escherichia coli - KIRBY BAUER*    CEFAZOLIN  INTERMEDIATE Intermediate     Escherichia coli - MIC*    AMPICILLIN >=32 RESISTANT Resistant     CEFEPIME  <=0.12 SENSITIVE Sensitive     CEFTAZIDIME <=1 SENSITIVE Sensitive     CEFTRIAXONE  <=0.25 SENSITIVE Sensitive     CIPROFLOXACIN >=4 RESISTANT Resistant     GENTAMICIN >=16 RESISTANT Resistant     IMIPENEM <=0.25 SENSITIVE Sensitive     TRIMETH/SULFA <=20 SENSITIVE Sensitive     AMPICILLIN/SULBACTAM 16 INTERMEDIATE Intermediate  PIP/TAZO <=4 SENSITIVE Sensitive ug/mL    * ESCHERICHIA COLI    ESCHERICHIA COLI  Blood Culture ID Panel (Reflexed)     Status: Abnormal   Collection Time: 06/01/23  9:38 AM  Result Value Ref Range Status   Enterococcus faecalis NOT DETECTED NOT DETECTED Final   Enterococcus Faecium NOT DETECTED NOT DETECTED Final   Listeria monocytogenes NOT DETECTED NOT DETECTED Final   Staphylococcus species NOT DETECTED NOT DETECTED Final   Staphylococcus aureus (BCID) NOT DETECTED NOT DETECTED Final   Staphylococcus epidermidis NOT DETECTED NOT DETECTED Final   Staphylococcus lugdunensis NOT DETECTED NOT DETECTED Final   Streptococcus species NOT DETECTED NOT DETECTED Final   Streptococcus agalactiae NOT DETECTED NOT DETECTED Final   Streptococcus pneumoniae NOT DETECTED NOT DETECTED Final   Streptococcus pyogenes NOT DETECTED NOT DETECTED Final   A.calcoaceticus-baumannii NOT DETECTED NOT DETECTED Final   Bacteroides fragilis NOT DETECTED NOT DETECTED Final   Enterobacterales DETECTED (A) NOT DETECTED Final    Comment: Enterobacterales represent a large order of gram negative bacteria, not a single organism. CRITICAL RESULT CALLED TO, READ BACK BY AND VERIFIED WITH: V BRYK,PHARMD@[email or web address]  06/02/23 MK    Enterobacter cloacae complex NOT DETECTED NOT DETECTED Final   Escherichia coli DETECTED (A) NOT DETECTED Final    Comment: CRITICAL RESULT CALLED TO, READ BACK BY AND VERIFIED WITH: V BRYK,PHARMD@0445  06/02/23 MK    Klebsiella aerogenes NOT DETECTED NOT DETECTED Final   Klebsiella oxytoca  NOT DETECTED NOT DETECTED Final   Klebsiella pneumoniae NOT DETECTED NOT DETECTED Final   Proteus species NOT DETECTED NOT DETECTED Final   Salmonella species NOT DETECTED NOT DETECTED Final   Serratia marcescens NOT DETECTED NOT DETECTED Final   Haemophilus influenzae NOT DETECTED NOT DETECTED Final   Neisseria meningitidis NOT DETECTED NOT DETECTED Final   Pseudomonas aeruginosa NOT DETECTED NOT DETECTED Final   Stenotrophomonas maltophilia NOT DETECTED NOT DETECTED Final   Candida albicans NOT DETECTED NOT DETECTED Final   Candida auris NOT DETECTED NOT DETECTED Final   Candida glabrata NOT DETECTED NOT DETECTED Final   Candida krusei NOT DETECTED NOT DETECTED Final   Candida parapsilosis NOT DETECTED NOT DETECTED Final   Candida tropicalis NOT DETECTED NOT DETECTED Final   Cryptococcus neoformans/gattii NOT DETECTED NOT DETECTED Final   CTX-M ESBL NOT DETECTED NOT DETECTED Final   Carbapenem resistance IMP NOT DETECTED NOT DETECTED Final   Carbapenem resistance KPC NOT DETECTED NOT DETECTED Final   Carbapenem resistance NDM NOT DETECTED NOT DETECTED Final   Carbapenem resist OXA 48 LIKE NOT DETECTED NOT DETECTED Final   Carbapenem resistance VIM NOT DETECTED NOT DETECTED Final    Comment: Performed at [facility], [address], [place], [place] [address]  Culture, blood (Routine X 2) w Reflex to ID Panel     Status: None   Collection Time: 06/01/23  9:43 AM   Specimen: BLOOD RIGHT ARM  Result Value Ref Range Status   Specimen Description BLOOD RIGHT ARM  Final   Special Requests   Final    AEROBIC BOTTLE ONLY Blood Culture results may not be optimal due to an inadequate volume of blood received in culture bottles   Culture   Final    NO GROWTH 5 DAYS Performed at [facility], [address] [place], [place] [address]    Report Status 06/06/2023 FINAL  Final     Studies: No results found.     Delaney Schnick, MD  [facility] 06/09/2023  If  7PM-7AM, please contact night-coverage

## 2023-06-10 DIAGNOSIS — R569 Unspecified convulsions: Secondary | ICD-10-CM | POA: Diagnosis not present

## 2023-06-10 LAB — CBC
HCT: 28.5 % — ABNORMAL LOW (ref 36.0–46.0)
Hemoglobin: 8.7 g/dL — ABNORMAL LOW (ref 12.0–15.0)
MCH: 26.9 pg (ref 26.0–34.0)
MCHC: 30.5 g/dL (ref 30.0–36.0)
MCV: 88 fL (ref 80.0–100.0)
Platelets: 437 10*3/uL — ABNORMAL HIGH (ref 150–400)
RBC: 3.24 MIL/uL — ABNORMAL LOW (ref 3.87–5.11)
RDW: 19.1 % — ABNORMAL HIGH (ref 11.5–15.5)
WBC: 10.6 10*3/uL — ABNORMAL HIGH (ref 4.0–10.5)
nRBC: 0 % (ref 0.0–0.2)

## 2023-06-10 LAB — BASIC METABOLIC PANEL WITH GFR
Anion gap: 7 (ref 5–15)
BUN: 24 mg/dL — ABNORMAL HIGH (ref 8–23)
CO2: 29 mmol/L (ref 22–32)
Calcium: 9.2 mg/dL (ref 8.9–10.3)
Chloride: 103 mmol/L (ref 98–111)
Creatinine, Ser: 0.44 mg/dL (ref 0.44–1.00)
GFR, Estimated: >60 mL/min (ref >60)
Glucose, Bld: 123 mg/dL — ABNORMAL HIGH (ref 70–99)
Potassium: 4.4 mmol/L (ref 3.5–5.1)
Sodium: 139 mmol/L (ref 135–145)

## 2023-06-10 LAB — MAGNESIUM: Magnesium: 2.3 mg/dL (ref 1.7–2.4)

## 2023-06-10 LAB — GLUCOSE, CAPILLARY
Glucose-Capillary: 114 mg/dL — ABNORMAL HIGH (ref 70–99)
Glucose-Capillary: 122 mg/dL — ABNORMAL HIGH (ref 70–99)
Glucose-Capillary: 134 mg/dL — ABNORMAL HIGH (ref 70–99)
Glucose-Capillary: 158 mg/dL — ABNORMAL HIGH (ref 70–99)
Glucose-Capillary: 90 mg/dL (ref 70–99)
Glucose-Capillary: 94 mg/dL (ref 70–99)

## 2023-06-10 NOTE — Plan of Care (Signed)
  Problem: Clinical Measurements: Goal: Ability to maintain clinical measurements within normal limits will improve Outcome: Progressing Goal: Will remain free from infection Outcome: Progressing   Problem: Nutrition: Goal: Adequate nutrition will be maintained Outcome: Progressing   Problem: Coping: Goal: Level of anxiety will decrease Outcome: Progressing   Problem: Elimination: Goal: Will not experience complications related to bowel motility Outcome: Progressing Goal: Will not experience complications related to urinary retention Outcome: Progressing   Problem: Pain Managment: Goal: General experience of comfort will improve and/or be controlled Outcome: Progressing   Problem: Safety: Goal: Ability to remain free from injury will improve Outcome: Progressing   Problem: Skin Integrity: Goal: Risk for impaired skin integrity will decrease Outcome: Progressing

## 2023-06-10 NOTE — Progress Notes (Signed)
 PROGRESS NOTE  Carolyn Parrish:[id number] DOB: 19-Oct-1940 DOA: 05/30/2023 PCP: Pcp, No   LOS: 11 days   Brief narrative:  Carolyn Parrish is a 83 y.o. female with past medical history of CVA with residual weakness and deficits, dementia, functional decline, breast CA s/p XRT and mastectomy 2020 currently residing at [facility] was initially admitted to the ICU as code stroke.  Patient had seizure-like activity in the setting of severe hypoglycemia.  Self extubated on 05/31/2023.  Patient had low-grade fever, worsening leukocytosis and was on IV vasopressors.  Urine cultures were positive for E. coli.  Subsequently, patient was transferred out of the ICU when her blood pressures were stable..      Assessment/Plan: Principal Problem:   Seizure (HCC) Active Problems:   Protein-calorie malnutrition, severe  New onset seizure  suspected secondary to hypoglycemia. No further seizures.  Will continue to monitor closely.  Seizure precautions.  Metabolic encephalopathy.  Mildly somnolent.  Has aphasia.   Hypoglycemia: On high glycemic protocol.  Monitor oral intake.  Was on nocturnal feeds.  Currently on dysphagia 2 diet.  Latest POC glucose of 134   Acute respiratory failure with inability to protect airway S/p extubation.  Currently on room air.   Sepsis secondary to E. coli UTI. E.coli bacteremia from blood cultures 05/30/23 Off vasopressors.  Completed course of IV Rocephin ..   Temperature max of 99 F.  Latest blood pressure of 112/ 47.  Hypokalemia - resolved.  Latest potassium of 4.4.  Hypocalcemia improved.  Latest calcium  of 9.0.  Hypophosphatemia- improved.  Latest phosphorus of 2.9   Essential hypertension: Blood pressure better at this time  History of CVA with residual weakness.  Will need skilled nursing facility placement.   Hyperlipidemia Continue statins.   History of CHF- 2 D echocardiogram within normal limits, currently compensated.   Severe  malnutrition Hypoalbuminemia Body mass index is 22.7 kg/m.  Nutrition Status: Nutrition Problem: Severe Malnutrition Etiology: chronic illness Signs/Symptoms: severe muscle depletion, severe fat depletion Interventions: Tube feeding, Prostat, MVI  Dietitian on board.   On dysphagia 2 diet. Received 48-hour calorie count with decreased calorie intake.  Family not inclined towards feeding tube.  Would let the natural course at this time.  Patient would not want a feeding tube as well..  On nocturnal feeds.  Could discontinue cortrak tube tube prior to discharge.  Deep tissue injury of the medial buttocks.  Present on admission.  Continue pressure ulcer prevention protocol.  Pressure Injury 05/31/23 Buttocks Medial Deep Tissue Pressure Injury - Purple or maroon localized area of discolored intact skin or blood-filled blister due to damage of underlying soft tissue from pressure and/or shear. (Active)  05/31/23 0523  Location: Buttocks  Location Orientation: Medial  Staging: Deep Tissue Pressure Injury - Purple or maroon localized area of discolored intact skin or blood-filled blister due to damage of underlying soft tissue from pressure and/or shear.  Wound Description (Comments):   Present on Admission: Yes      DVT prophylaxis: heparin  injection 5,000 Units Start: 05/30/23 2200 SCDs Start: 05/30/23 1047   Disposition: Skilled nursing facility as per PT evaluation.  Patient from skilled nursing facility.  TOC on board.  Medically stable for disposition.  Status is: Inpatient  Remains inpatient appropriate because:  cortrak tube tube feeding,  skilled nursing facility placement.    Code Status:     Code Status: Do not attempt resuscitation (DNR) PRE-ARREST INTERVENTIONS DESIRED  Family Communication: Spoke with the patient's daughter on the  phone 06-09-23 and updated about the clinical course. Consultants: PCCM  Procedures: Cortrak tube placement  Anti-infectives:  Rocephin   IV  Anti-infectives (From admission, onward)    Start     Dose/Rate Route Frequency Ordered Stop   06/01/23 1000  cefTRIAXone  (ROCEPHIN ) 2 g in sodium chloride  0.9 % 100 mL IVPB        2 g 200 mL/hr over 30 Minutes Intravenous Every 24 hours 06/01/23 0755 06/07/23 1024       Subjective: Today, patient was seen and examined at bedside.   Patient is mildly interactive.  Denies any pain nausea vomiting.  Mild interactive. Objective: Vitals:   06/09/23 2326 06/10/23 0819  BP: (!) 109/50 (!) 112/47  Pulse: 85   Resp: 19   Temp: 98 F (36.7 C) 97.9 F (36.6 C)  SpO2: 100%     Intake/Output Summary (Last 24 hours) at 06/10/2023 1103 Last data filed at 06/10/2023 0858 Gross per 24 hour  Intake 240 ml  Output 1250 ml  Net -1010 ml   Filed Weights   06/08/23 0334 06/09/23 0451 06/10/23 0554  Weight: 62.2 kg 61.8 kg 63.8 kg   Body mass index is 22.7 kg/m.   Physical Exam:  GENERAL: Patient is alert awake, aphasic,, following commands, appears weak and deconditioned.  Elderly female.  Cortrak tube tube in place. HENT: No scleral pallor or icterus. Pupils equally reactive to light. Oral mucosa is moist NECK: is supple, no gross swelling noted. CHEST: Clear to auscultation. No crackles or wheezes.   CVS: S1 and S2 heard, no murmur. Regular rate and rhythm.  ABDOMEN: Soft, non-tender, bowel sounds are present. EXTREMITIES: No edema.  Right-sided weakness. CNS: Weakness of the extremities noted, right-sided weakness more than the left.  Moving some extremities.   Follows commands. SKIN: warm and dry without rashes.  Data Review: I have personally reviewed the following laboratory data and studies,  CBC: Recent Labs  Lab 06/04/23 0815 06/05/23 0458 06/06/23 0819 06/07/23 0500 06/10/23 0300  WBC 9.6 11.6* 10.6* 10.4 10.6*  HGB 9.3* 9.4* 8.5* 8.6* 8.7*  HCT 30.6* 30.6* 27.4* 27.8* 28.5*  MCV 89.5 86.7 87.5 88.0 88.0  PLT 258 315 366 386 437*   Basic Metabolic  Panel: Recent Labs  Lab 06/04/23 0815 06/05/23 0458 06/06/23 0456 06/07/23 0500 06/10/23 0300  NA 144 140 141 141 139  K 3.4* 3.6 4.5 4.4 4.4  CL 107 105 107 106 103  CO2 26 24 24 26 29   GLUCOSE 118* 141* 118* 135* 123*  BUN 18 19 22 22  24*  CREATININE 0.43* 0.43* 0.44 0.49 0.44  CALCIUM  9.3 9.1 9.3 9.0 9.2  MG 2.2  --   --  2.2 2.3  PHOS 1.2* 2.7 3.1 2.9  --    Liver Function Tests: No results for input(s): "AST", "ALT", "ALKPHOS", "BILITOT", "PROT", "ALBUMIN " in the last 168 hours.  No results for input(s): "LIPASE", "AMYLASE" in the last 168 hours. No results for input(s): "AMMONIA" in the last 168 hours. Cardiac Enzymes: No results for input(s): "CKTOTAL", "CKMB", "CKMBINDEX", "TROPONINI" in the last 168 hours. BNP (last 3 results) No results for input(s): "BNP" in the last 8760 hours.  ProBNP (last 3 results) No results for input(s): "PROBNP" in the last 8760 hours.  CBG: Recent Labs  Lab 06/09/23 1214 06/09/23 1735 06/09/23 1959 06/10/23 0000 06/10/23 0821  GLUCAP 148* 113* 124* 114* 134*   Recent Results (from the past 240 hours)  Culture, blood (Routine X 2) w Reflex  to ID Panel     Status: Abnormal   Collection Time: 06/01/23  9:38 AM   Specimen: BLOOD LEFT HAND  Result Value Ref Range Status   Specimen Description BLOOD LEFT HAND  Final   Special Requests   Final    BOTTLES DRAWN AEROBIC AND ANAEROBIC Blood Culture results may not be optimal due to an inadequate volume of blood received in culture bottles   Culture  Setup Time   Final    GRAM NEGATIVE RODS AEROBIC BOTTLE ONLY CRITICAL RESULT CALLED TO, READ BACK BY AND VERIFIED WITH: V BRYK,PHARMD@[id number]  06/02/23 MK Performed at [facility], [address], [place], [place] [address]    Culture ESCHERICHIA COLI (A)  Final   Report Status 06/05/2023 FINAL  Final   Organism ID, Bacteria ESCHERICHIA COLI  Final   Organism ID, Bacteria ESCHERICHIA COLI  Final      Susceptibility   Escherichia  coli - KIRBY BAUER*    CEFAZOLIN  INTERMEDIATE Intermediate    Escherichia coli - MIC*    AMPICILLIN >=32 RESISTANT Resistant     CEFEPIME  <=0.12 SENSITIVE Sensitive     CEFTAZIDIME <=1 SENSITIVE Sensitive     CEFTRIAXONE  <=0.25 SENSITIVE Sensitive     CIPROFLOXACIN >=4 RESISTANT Resistant     GENTAMICIN >=16 RESISTANT Resistant     IMIPENEM <=0.25 SENSITIVE Sensitive     TRIMETH/SULFA <=20 SENSITIVE Sensitive     AMPICILLIN/SULBACTAM 16 INTERMEDIATE Intermediate     PIP/TAZO <=4 SENSITIVE Sensitive ug/mL    * ESCHERICHIA COLI    ESCHERICHIA COLI  Blood Culture ID Panel (Reflexed)     Status: Abnormal   Collection Time: 06/01/23  9:38 AM  Result Value Ref Range Status   Enterococcus faecalis NOT DETECTED NOT DETECTED Final   Enterococcus Faecium NOT DETECTED NOT DETECTED Final   Listeria monocytogenes NOT DETECTED NOT DETECTED Final   Staphylococcus species NOT DETECTED NOT DETECTED Final   Staphylococcus aureus (BCID) NOT DETECTED NOT DETECTED Final   Staphylococcus epidermidis NOT DETECTED NOT DETECTED Final   Staphylococcus lugdunensis NOT DETECTED NOT DETECTED Final   Streptococcus species NOT DETECTED NOT DETECTED Final   Streptococcus agalactiae NOT DETECTED NOT DETECTED Final   Streptococcus pneumoniae NOT DETECTED NOT DETECTED Final   Streptococcus pyogenes NOT DETECTED NOT DETECTED Final   A.calcoaceticus-baumannii NOT DETECTED NOT DETECTED Final   Bacteroides fragilis NOT DETECTED NOT DETECTED Final   Enterobacterales DETECTED (A) NOT DETECTED Final    Comment: Enterobacterales represent a large order of gram negative bacteria, not a single organism. CRITICAL RESULT CALLED TO, READ BACK BY AND VERIFIED WITH: V BRYK,PHARMD@0445  06/02/23 MK    Enterobacter cloacae complex NOT DETECTED NOT DETECTED Final   Escherichia coli DETECTED (A) NOT DETECTED Final    Comment: CRITICAL RESULT CALLED TO, READ BACK BY AND VERIFIED WITH: V BRYK,PHARMD@0445  06/02/23 MK    Klebsiella  aerogenes NOT DETECTED NOT DETECTED Final   Klebsiella oxytoca NOT DETECTED NOT DETECTED Final   Klebsiella pneumoniae NOT DETECTED NOT DETECTED Final   Proteus species NOT DETECTED NOT DETECTED Final   Salmonella species NOT DETECTED NOT DETECTED Final   Serratia marcescens NOT DETECTED NOT DETECTED Final   Haemophilus influenzae NOT DETECTED NOT DETECTED Final   Neisseria meningitidis NOT DETECTED NOT DETECTED Final   Pseudomonas aeruginosa NOT DETECTED NOT DETECTED Final   Stenotrophomonas maltophilia NOT DETECTED NOT DETECTED Final   Candida albicans NOT DETECTED NOT DETECTED Final   Candida auris NOT DETECTED NOT DETECTED Final  Candida glabrata NOT DETECTED NOT DETECTED Final   Candida krusei NOT DETECTED NOT DETECTED Final   Candida parapsilosis NOT DETECTED NOT DETECTED Final   Candida tropicalis NOT DETECTED NOT DETECTED Final   Cryptococcus neoformans/gattii NOT DETECTED NOT DETECTED Final   CTX-M ESBL NOT DETECTED NOT DETECTED Final   Carbapenem resistance IMP NOT DETECTED NOT DETECTED Final   Carbapenem resistance KPC NOT DETECTED NOT DETECTED Final   Carbapenem resistance NDM NOT DETECTED NOT DETECTED Final   Carbapenem resist OXA 48 LIKE NOT DETECTED NOT DETECTED Final   Carbapenem resistance VIM NOT DETECTED NOT DETECTED Final    Comment: Performed at [facility], [address], [place], [place] [address]  Culture, blood (Routine X 2) w Reflex to ID Panel     Status: None   Collection Time: 06/01/23  9:43 AM   Specimen: BLOOD RIGHT ARM  Result Value Ref Range Status   Specimen Description BLOOD RIGHT ARM  Final   Special Requests   Final    AEROBIC BOTTLE ONLY Blood Culture results may not be optimal due to an inadequate volume of blood received in culture bottles   Culture   Final    NO GROWTH 5 DAYS Performed at [facility], [address] [place], [place] [address]    Report Status 06/06/2023 FINAL  Final     Studies: No results  found.     Odeal Welden, MD  [facility] 06/10/2023  If 7PM-7AM, please contact night-coverage

## 2023-06-11 DIAGNOSIS — R569 Unspecified convulsions: Secondary | ICD-10-CM | POA: Diagnosis not present

## 2023-06-11 LAB — GLUCOSE, CAPILLARY
Glucose-Capillary: 103 mg/dL — ABNORMAL HIGH (ref 70–99)
Glucose-Capillary: 106 mg/dL — ABNORMAL HIGH (ref 70–99)
Glucose-Capillary: 115 mg/dL — ABNORMAL HIGH (ref 70–99)
Glucose-Capillary: 119 mg/dL — ABNORMAL HIGH (ref 70–99)
Glucose-Capillary: 85 mg/dL (ref 70–99)
Glucose-Capillary: 88 mg/dL (ref 70–99)

## 2023-06-11 NOTE — TOC Progression Note (Addendum)
 Transition of Care [facility]) - Progression Note    Patient Details  Name: Carolyn Parrish MRN: [id number] Date of Birth: August 18, 1940  Transition of Care [facility]) CM/SW Contact  Carmon Christen, LCSWA Phone Number: 06/11/2023, 11:55 AM  Clinical Narrative:     CSW requested current PT/OT notes for patient. CSW following to start auth once current PT/OT notes are in for patient. CSW will continue to follow.  Update- CSW spoke with Tonya at [facility] who confirmed they will start insurance authorization for patient.  Expected Discharge Plan: Skilled Nursing Facility Barriers to Discharge: Continued Medical Work up  Expected Discharge Plan and Services In-house Referral: Clinical Social Work   Post Acute Care Choice: Skilled Nursing Facility Living arrangements for the past 2 months: Skilled Nursing Facility                                       Social Determinants of Health (SDOH) Interventions SDOH Screenings   Food Insecurity: Patient Unable To Answer (06/01/2023)  Housing: Patient Unable To Answer (06/01/2023)  Transportation Needs: Patient Unable To Answer (06/01/2023)  Utilities: Patient Unable To Answer (06/01/2023)  Depression (PHQ2-9): Medium Risk (12/03/2018)  Financial Resource Strain: Low Risk  (01/15/2018)  Physical Activity: Inactive (01/15/2018)  Social Connections: Unknown (06/01/2023)  Stress: Stress Concern Present (01/15/2018)  Tobacco Use: Low Risk  (05/30/2023)    Readmission Risk Interventions     No data to display

## 2023-06-11 NOTE — Evaluation (Signed)
 Occupational Therapy Evaluation Patient Details Name: Carolyn Parrish MRN: [id number] DOB: 04/17/1940 Today's Date: 06/11/2023   History of Present Illness   Pt is an 83 y.o. female who presented 05/30/23 with AMS and R gaze. Witnessed seizure en route to ED. MRI showed no acute intracranial abnormality. Pt admitted with hypoglycemia provoked seizure and sepsis secondary to UTI. Cortrak placed 4/25. ETT 4/23 - 4/24. PMH: prior CVA with residual weakness and deficits, dementia, functional decline, breast CA s/p XRT and mastectomy 2020, CHF, DM, HLD, HTN, schizoaffective disorder     Clinical Impressions PTA, pt from [facility] where she reports she was working on sitting EOB with therapies; using hoyer for transfers to chair. Upon eval, pt with impaired sitting balance, poor activity tolerance, decreased cognition (likely at baseline), and debility. Pt needing max A for rolling toward R side of bed and total A for coming to seated position but unable to maintain despite support provided secondary to complaints of RLE pain this session. Pt hopeful to continue progressing with sitting balance post-acutely. Patient will benefit from continued inpatient follow up therapy, <3 hours/day    If plan is discharge home, recommend the following:   Two people to help with walking and/or transfers;A lot of help with bathing/dressing/bathroom;Assistance with cooking/housework;Assistance with feeding;Assist for transportation;Help with stairs or ramp for entrance;Direct supervision/assist for medications management     Functional Status Assessment   Patient has had a recent decline in their functional status and demonstrates the ability to make significant improvements in function in a reasonable and predictable amount of time.     Equipment Recommendations   None recommended by OT     Recommendations for Other Services         Precautions/Restrictions   Precautions Precautions:  Fall Recall of Precautions/Restrictions: Impaired Precaution/Restrictions Comments: cortrak Restrictions [employer]earing Restrictions Per Provider Order: No     Mobility Bed Mobility Overal bed mobility: Needs Assistance Bed Mobility: Rolling, Sidelying to Sit, Sit to Supine Rolling: Max assist Sidelying to sit: Total assist   Sit to supine: Total assist   General bed mobility comments: pt able to assist in rolling towards R with LUE reaching across body to opposite bedrail and initiation of truncal translation with step by step cues to sequence . total assist for supine<>sit all aspects, limited EOB tolerance given RLE pain once sitting EOB    Transfers                   General transfer comment: hoyer at baseline      Balance Overall balance assessment: Needs assistance Sitting-balance support: No upper extremity supported, Feet supported Sitting balance-Leahy Scale: Poor Sitting balance - Comments: posterior bias, requiring PT truncal assist to maintain upright. poor tolerance and pain noted in RLE with pt beginning to cry                                   ADL either performed or assessed with clinical judgement   ADL Overall ADL's : At baseline;Needs assistance/impaired Eating/Feeding: Minimal assistance;Bed level Eating/Feeding Details (indicate cue type and reason): with RUE Grooming: Moderate assistance;Wash/dry face Grooming Details (indicate cue type and reason): for thoroughness                               General ADL Comments: Pt likely at baseline, able to perform some  self feeding with fingers, hold cups, total A for all other ADLs.     Vision   Additional Comments: not formally assessed. makes good eye contact     Perception         Praxis         Pertinent Vitals/Pain Pain Assessment Pain Assessment: Faces Faces Pain Scale: Hurts little more Pain Location: RLE, neck Pain Descriptors / Indicators: Grimacing,  Discomfort Pain Intervention(s): Limited activity within patient's tolerance, Monitored during session     Extremity/Trunk Assessment Upper Extremity Assessment Upper Extremity Assessment: RUE deficits/detail;LUE deficits/detail RUE Deficits / Details: history of CVA, can perform active assisted elbow flex/ext with A for initiation; 3+/5 grasp, 3-/5 shoulder flexion RUE Coordination: decreased fine motor;decreased gross motor LUE Deficits / Details: Functional for basic tasks, some finger feeding, able to hold cups, poor FM skills. LUE Coordination: decreased fine motor;decreased gross motor   Lower Extremity Assessment Lower Extremity Assessment: Defer to PT evaluation   Cervical / Trunk Assessment Cervical / Trunk Assessment: Kyphotic   Communication Communication Communication: Impaired Factors Affecting Communication: Reduced clarity of speech;Difficulty expressing self   Cognition Arousal: Alert Behavior During Therapy: WFL for tasks assessed/performed Cognition: History of cognitive impairments                               Following commands: Impaired Following commands impaired: Follows one step commands with increased time     Cueing  General Comments   Cueing Techniques: Verbal cues;Tactile cues;Gestural cues  VSS   Exercises     Shoulder Instructions      Home Living Family/patient expects to be discharged to:: Skilled nursing facility                                 Additional Comments: from [facility], long term resident      Prior Functioning/Environment Prior Level of Function : Needs assist             Mobility Comments: Hoyer lift transfers OOB; does not get OOB often and does not use w/c much anymore, primarily only goes to a recliner if OOB; pt can reach to assist in rolling but needs assistance to complete; Heavy assist for supine to sit and sitting balance (taken from previous chart review, pt confirms this) ADLs  Comments: sponge bathes in bed, assist for all ADLs; pt reports staff feeds her but on evaluation is able to reach her mouth with weak grasp on utensil    OT Problem List: Decreased strength;Decreased range of motion;Decreased activity tolerance;Impaired balance (sitting and/or standing);Decreased coordination;Decreased cognition;Impaired UE functional use   OT Treatment/Interventions:        OT Goals(Current goals can be found in the care plan section)   Acute Rehab OT Goals Patient Stated Goal: go home OT Goal Formulation: With patient   OT Frequency:       Co-evaluation              AM-PAC OT "6 Clicks" Daily Activity     Outcome Measure Help from another person eating meals?: A Lot Help from another person taking care of personal grooming?: A Lot Help from another person toileting, which includes using toliet, bedpan, or urinal?: Total Help from another person bathing (including washing, rinsing, drying)?: Total Help from another person to put on and taking off regular upper body clothing?: Total Help from another  person to put on and taking off regular lower body clothing?: Total 6 Click Score: 8   End of Session Nurse Communication: Mobility status  Activity Tolerance: Patient tolerated treatment well Patient left: in bed;with call bell/phone within reach;with bed alarm set  OT Visit Diagnosis: Muscle weakness (generalized) (M62.81);Feeding difficulties (R63.3);Other symptoms and signs involving cognitive function;Hemiplegia and hemiparesis                Time: 1552-1610 OT Time Calculation (min): 18 min Charges:  OT General Charges $OT Visit: 1 Visit OT Evaluation $OT Eval Low Complexity: 1 Low  Karilyn Ouch, OTR/L [facility] Acute Rehabilitation Office: [phone]   Carolyn Parrish 06/11/2023, 4:52 PM

## 2023-06-11 NOTE — Evaluation (Signed)
 Physical Therapy Evaluation Patient Details Name: Carolyn Parrish MRN: [id number] DOB: 1940/05/28 Today's Date: 06/11/2023  History of Present Illness  Pt is an 83 y.o. female who presented 05/30/23 with AMS and R gaze. Witnessed seizure en route to ED. MRI showed no acute intracranial abnormality. Pt admitted with hypoglycemia provoked seizure and sepsis secondary to UTI. Cortrak placed 4/25. ETT 4/23 - 4/24. PMH: prior CVA with residual weakness and deficits, dementia, functional decline, breast CA s/p XRT and mastectomy 2020, CHF, DM, HLD, HTN, schizoaffective disorder  Clinical Impression   Pt presents with debility, impaired sitting balance, poor activity tolerance, and AMS. Pt sat EOB this date x2 minutes before fatiguing and endorsing RLE pain, pt overall requiring max-total assist for bed mobility tasks. Pt endorsing using hoyer lift OOB at facility PTA, but has been working on sitting balance and would like to progress with mobility post-acutely.    VSS    If plan is discharge home, recommend the following: Two people to help with walking and/or transfers;Two people to help with bathing/dressing/bathroom;Assistance with cooking/housework;Assistance with feeding;Direct supervision/assist for medications management;Direct supervision/assist for financial management;Assist for transportation;Help with stairs or ramp for entrance   Can travel by private vehicle        Equipment Recommendations None recommended by PT  Recommendations for Other Services       Functional Status Assessment Patient has not had a recent decline in their functional status     Precautions / Restrictions Precautions Precautions: Fall Recall of Precautions/Restrictions: Impaired Precaution/Restrictions Comments: cortrak Restrictions Weight Bearing Restrictions Per Provider Order: No      Mobility  Bed Mobility Overal bed mobility: Needs Assistance Bed Mobility: Rolling, Sidelying to Sit, Sit to  Supine Rolling: Max assist Sidelying to sit: Total assist   Sit to supine: Total assist   General bed mobility comments: pt able to assist in rolling towards R with LUE reaching across body to opposite bedrail and initiation of truncal translation. total assist for supine<>sit all aspects, limited EOB tolerance given RLE pain once sitting EOB    Transfers                        Ambulation/Gait               General Gait Details: nonambulatory at baseline  Stairs            Wheelchair Mobility     Tilt Bed    Modified Rankin (Stroke Patients Only) Modified Rankin (Stroke Patients Only) Pre-Morbid Rankin Score: Severe disability Modified Rankin: Severe disability     Balance Overall balance assessment: Needs assistance Sitting-balance support: No upper extremity supported, Feet supported Sitting balance-Leahy Scale: Poor Sitting balance - Comments: posterior bias, requiring PT truncal assist to maintain upright. sitting tolerance x2 minutes before fatiguing       Standing balance comment: deferred, does not stand at baseline                             Pertinent Vitals/Pain Pain Assessment Pain Assessment: Faces Faces Pain Scale: Hurts little more Pain Location: RLE, neck Pain Descriptors / Indicators: Grimacing, Discomfort Pain Intervention(s): Limited activity within patient's tolerance, Monitored during session, Repositioned    Home Living Family/patient expects to be discharged to:: Skilled nursing facility                   Additional Comments: from [place], long term resident  Prior Function Prior Level of Function : Needs assist             Mobility Comments: Hoyer lift transfers OOB; does not get OOB often and does not use w/c much anymore, primarily only goes to a recliner if OOB; pt can reach to assist in rolling but needs assistance to complete; Heavy assist for supine to sit and sitting balance (taken  from previous chart review, pt confirms this) ADLs Comments: sponge bathes in bed, assist for all ADLs     Extremity/Trunk Assessment   Upper Extremity Assessment Upper Extremity Assessment: RUE deficits/detail;LUE deficits/detail;Generalized weakness RUE Deficits / Details: history of CVA, can perform active assisted elbow flex/ext LUE Coordination: decreased fine motor;decreased gross motor    Lower Extremity Assessment Lower Extremity Assessment: Generalized weakness    Cervical / Trunk Assessment Cervical / Trunk Assessment: Kyphotic  Communication   Communication Communication: Impaired Factors Affecting Communication: Reduced clarity of speech;Difficulty expressing self    Cognition Arousal: Alert Behavior During Therapy: WFL for tasks assessed/performed   PT - Cognitive impairments: History of cognitive impairments                       PT - Cognition Comments: documented history of dementia. pt oriented to month and self, not location or situation or year. Pt denies being at [facility], states she was "right here". follows one-step commands with increased time. Following commands: Impaired Following commands impaired: Follows one step commands with increased time     Cueing Cueing Techniques: Verbal cues, Tactile cues, Gestural cues     General Comments      Exercises     Assessment/Plan    PT Assessment All further PT needs can be met in the next venue of care  PT Problem List Decreased strength;Decreased activity tolerance;Decreased balance;Decreased mobility;Decreased cognition       PT Treatment Interventions      PT Goals (Current goals can be found in the Care Plan section)  Acute Rehab PT Goals PT Goal Formulation: All assessment and education complete, DC therapy Time For Goal Achievement: 06/03/23 Potential to Achieve Goals: Fair    Frequency       Co-evaluation               AM-PAC PT "6 Clicks" Mobility  Outcome  Measure Help needed turning from your back to your side while in a flat bed without using bedrails?: Total Help needed moving from lying on your back to sitting on the side of a flat bed without using bedrails?: Total Help needed moving to and from a bed to a chair (including a wheelchair)?: Total Help needed standing up from a chair using your arms (e.g., wheelchair or bedside chair)?: Total Help needed to walk in hospital room?: Total Help needed climbing 3-5 steps with a railing? : Total 6 Click Score: 6    End of Session   Activity Tolerance: Patient tolerated treatment well Patient left: in bed;with call bell/phone within reach;with family/visitor present;with bed alarm set Nurse Communication: Mobility status PT Visit Diagnosis: Muscle weakness (generalized) (M62.81)    Time: 9562-1308 PT Time Calculation (min) (ACUTE ONLY): 23 min   Charges:   PT Evaluation $PT Eval Low Complexity: 1 Low   PT General Charges $$ ACUTE PT VISIT: 1 Visit         Shirlene Doughty, PT DPT Acute Rehabilitation Services Secure Chat Preferred  Office [phone]   Virdia Ziesmer Cydney Draft 06/11/2023, 2:58 PM

## 2023-06-11 NOTE — Plan of Care (Signed)

## 2023-06-11 NOTE — Progress Notes (Signed)
 PROGRESS NOTE  Carolyn Parrish VZD:[id number] DOB: 26-Aug-1940 DOA: 05/30/2023 PCP: Pcp, No   LOS: 12 days   Brief narrative:  Carolyn Parrish is a 83 y.o. female with past medical history of CVA with residual weakness and deficits, dementia, functional decline, breast CA s/p XRT and mastectomy 2020 currently residing at [facility] was initially admitted to the ICU as code stroke.  Patient had seizure-like activity in the setting of severe hypoglycemia.  Self extubated on 05/31/2023.  Patient had low-grade fever, worsening leukocytosis and was on IV vasopressors.  Urine cultures were positive for E. coli.  Subsequently, patient was transferred out of the ICU when her blood pressures were stable.  At this time patient has cortrak tube to have a for nocturnal feeds otherwise remained stable for a skilled nursing facility.     Assessment/Plan: Principal Problem:   Seizure (HCC) Active Problems:   Protein-calorie malnutrition, severe  New onset seizure  suspected secondary to hypoglycemia. No further seizures.  Will continue to monitor closely.  Seizure precautions.  Latest POC glucose of 106.  Metabolic encephalopathy.  Mild aphasic.  Improved.   Hypoglycemia: On hypoglycemic protocol.  Monitor oral intake.  on nocturnal feeds.  Currently on dysphagia 2 diet.  Latest POC glucose of 106.   Acute respiratory failure with inability to protect airway S/p extubation.  Currently on room air.   Sepsis secondary to E. coli UTI. E.coli bacteremia from blood cultures 05/30/23 Off vasopressors.  Completed course of IV Rocephin ..   Temperature max of 98.9 F.  Latest blood pressure of 110/47  Hypokalemia - resolved.  Latest potassium of 4.4.  Hypocalcemia improved.  Latest calcium  of 9.2.  Hypophosphatemia- improved.  Latest phosphorus of 2.9   Essential hypertension: Blood pressure better at this time-110/47  History of CVA with residual weakness.  PT has recommended skilled nursing  facility placement on discharge.   Hyperlipidemia  History of CHF- 2 D echocardiogram within normal limits, currently compensated.   Severe malnutrition Hypoalbuminemia Body mass index is 23.27 kg/m.  Nutrition Status: Nutrition Problem: Severe Malnutrition Etiology: chronic illness Signs/Symptoms: severe muscle depletion, severe fat depletion Interventions: Tube feeding, Prostat, MVI  Dietitian on board.   On dysphagia 2 diet.  Patient underwent 48-hour calorie count with decreased calorie intake.  Family not inclined towards feeding tube.  Would let the natural course at this time.  Patient would not want a feeding tube as well..  On nocturnal feeds.  Could discontinue cortrak tube tube prior to discharge when bed is available..  Deep tissue injury of the medial buttocks.  Present on admission.  Continue pressure ulcer prevention protocol.  Pressure Injury 05/31/23 Buttocks Medial Deep Tissue Pressure Injury - Purple or maroon localized area of discolored intact skin or blood-filled blister due to damage of underlying soft tissue from pressure and/or shear. (Active)  05/31/23 0523  Location: Buttocks  Location Orientation: Medial  Staging: Deep Tissue Pressure Injury - Purple or maroon localized area of discolored intact skin or blood-filled blister due to damage of underlying soft tissue from pressure and/or shear.  Wound Description (Comments):   Present on Admission: Yes      DVT prophylaxis: heparin  injection 5,000 Units Start: 05/30/23 2200 SCDs Start: 05/30/23 1047   Disposition: Skilled nursing facility as per PT evaluation.  Patient from skilled nursing facility.  TOC on board.  Medically stable for disposition when bed is found..  Status is: Inpatient  Remains inpatient appropriate because:  cortrak tube tube feeding,  skilled nursing facility placement.    Code Status:     Code Status: Do not attempt resuscitation (DNR) PRE-ARREST INTERVENTIONS DESIRED  Family  Communication: Spoke with the patient's daughter on the phone 06-09-23  Consultants: PCCM  Procedures: Cortrak tube placement  Anti-infectives:  Rocephin  IV-completed   Subjective: Today, patient was seen and examined at bedside.   Patient denies interval complaints.  Interactive.  Denies any pain nausea vomiting.  Follows few commands.   Objective: Vitals:   06/11/23 0343 06/11/23 0736  BP: (!) 110/47   Pulse: 81   Resp: 18   Temp: 97.8 F (36.6 C) 98.1 F (36.7 C)  SpO2: 100%     Intake/Output Summary (Last 24 hours) at 06/11/2023 1030 Last data filed at 06/11/2023 0925 Gross per 24 hour  Intake 574 ml  Output 600 ml  Net -26 ml   Filed Weights   06/09/23 0451 06/10/23 0554 06/11/23 0343  Weight: 61.8 kg 63.8 kg 65.4 kg   Body mass index is 23.27 kg/m.   Physical Exam:  GENERAL: Patient is alert awake, has aphasia,, following commands, appears weak and deconditioned.  Elderly female.  Cortrak tube tube in place. HENT: No scleral pallor or icterus. Pupils equally reactive to light. Oral mucosa is moist NECK: is supple, no gross swelling noted. CHEST: Clear to auscultation. No crackles or wheezes.   CVS: S1 and S2 heard, no murmur. Regular rate and rhythm.  ABDOMEN: Soft, non-tender, bowel sounds are present. EXTREMITIES: No edema.  Right-sided weakness. CNS: Weakness of the extremities noted, right-sided weakness more than the left.  Moving some extremities.   Follows commands. SKIN: warm and dry without rashes.  Data Review: I have personally reviewed the following laboratory data and studies,  CBC: Recent Labs  Lab 06/05/23 0458 06/06/23 0819 06/07/23 0500 06/10/23 0300  WBC 11.6* 10.6* 10.4 10.6*  HGB 9.4* 8.5* 8.6* 8.7*  HCT 30.6* 27.4* 27.8* 28.5*  MCV 86.7 87.5 88.0 88.0  PLT 315 366 386 437*   Basic Metabolic Panel: Recent Labs  Lab 06/05/23 0458 06/06/23 0456 06/07/23 0500 06/10/23 0300  NA 140 141 141 139  K 3.6 4.5 4.4 4.4  CL 105 107  106 103  CO2 24 24 26 29   GLUCOSE 141* 118* 135* 123*  BUN 19 22 22  24*  CREATININE 0.43* 0.44 0.49 0.44  CALCIUM  9.1 9.3 9.0 9.2  MG  --   --  2.2 2.3  PHOS 2.7 3.1 2.9  --    Liver Function Tests: No results for input(s): "AST", "ALT", "ALKPHOS", "BILITOT", "PROT", "ALBUMIN " in the last 168 hours.  No results for input(s): "LIPASE", "AMYLASE" in the last 168 hours. No results for input(s): "AMMONIA" in the last 168 hours. Cardiac Enzymes: No results for input(s): "CKTOTAL", "CKMB", "CKMBINDEX", "TROPONINI" in the last 168 hours. BNP (last 3 results) No results for input(s): "BNP" in the last 8760 hours.  ProBNP (last 3 results) No results for input(s): "PROBNP" in the last 8760 hours.  CBG: Recent Labs  Lab 06/10/23 1730 06/10/23 1941 06/10/23 2348 06/11/23 0347 06/11/23 0739  GLUCAP 94 122* 158* 119* 106*   No results found for this or any previous visit (from the past 240 hours).    Studies: No results found.     Laysa Kimmey, MD  [facility] 06/11/2023  If 7PM-7AM, please contact night-coverage

## 2023-06-12 DIAGNOSIS — R569 Unspecified convulsions: Secondary | ICD-10-CM | POA: Diagnosis not present

## 2023-06-12 LAB — GLUCOSE, CAPILLARY
Glucose-Capillary: 141 mg/dL — ABNORMAL HIGH (ref 70–99)
Glucose-Capillary: 129 mg/dL — ABNORMAL HIGH (ref 70–99)

## 2023-06-12 MED ORDER — TRAZODONE HCL 50 MG PO TABS: 25.0000 mg | ORAL_TABLET | Freq: Every evening | ORAL | Status: DC | PRN

## 2023-06-12 MED ORDER — VITAMIN D3 25 MCG (1000 UT) PO CAPS: 1.0000 | ORAL_CAPSULE | Freq: Every day | ORAL | Status: DC

## 2023-06-12 MED ORDER — ASPIRIN EC 81 MG PO TBEC: 81.0000 mg | DELAYED_RELEASE_TABLET | Freq: Every day | ORAL | Status: AC

## 2023-06-12 MED ORDER — BISACODYL 10 MG RE SUPP: 10.0000 mg | RECTAL | Status: AC | PRN

## 2023-06-12 MED ORDER — ADULT MULTIVITAMIN W/MINERALS CH: 1.0000 | ORAL_TABLET | Freq: Every day | ORAL | Status: AC

## 2023-06-12 NOTE — TOC Progression Note (Signed)
 Transition of Care [facility]) - Progression Note    Patient Details  Name: Carolyn Parrish MRN: [id number] Date of Birth: 01-07-1941  Transition of Care [facility]) CM/SW Contact  Carmon Christen, LCSWA Phone Number: 06/12/2023, 11:21 AM  Clinical Narrative:     CSW spoke with [place] with [facility] who informed CSW that patient is optum and no insurance authorization needed for patient to return to facility. Facility informed CSW that they will start insurance authorization at facility and patient can received rehab when she returns. CSW informed MD. CSW will continue to follow.  Expected Discharge Plan: Skilled Nursing Facility Barriers to Discharge: Continued Medical Work up  Expected Discharge Plan and Services In-house Referral: Clinical Social Work   Post Acute Care Choice: Skilled Nursing Facility Living arrangements for the past 2 months: Skilled Nursing Facility                                       Social Determinants of Health (SDOH) Interventions SDOH Screenings   Food Insecurity: Patient Unable To Answer (06/01/2023)  Housing: Patient Unable To Answer (06/01/2023)  Transportation Needs: Patient Unable To Answer (06/01/2023)  Utilities: Patient Unable To Answer (06/01/2023)  Depression (PHQ2-9): Medium Risk (12/03/2018)  Financial Resource Strain: Low Risk  (01/15/2018)  Physical Activity: Inactive (01/15/2018)  Social Connections: Unknown (06/01/2023)  Stress: Stress Concern Present (01/15/2018)  Tobacco Use: Low Risk  (05/30/2023)    Readmission Risk Interventions     No data to display

## 2023-06-12 NOTE — TOC Transition Note (Signed)
 Transition of Care [facility]) - Discharge Note   Patient Details  Name: Carolyn Parrish MRN: [id number] Date of Birth: Jul 07, 1940  [facility]) CM/SW Contact:  Carmon Christen, LCSWA Phone Number: 06/12/2023, 1:13 PM   Clinical Narrative:     Patient will DC to: [facility]   Anticipated DC date: 06/12/2023  Family notified: Blaine Bump  Transport by: Lyna Sandhoff  ?  Per MD patient ready for DC to [facility] . RN, patient, patient's family, and facility notified of DC. Discharge Summary sent to facility. RN given number for report tele# [phone] RM#[id number]. DC packet on chart. DNR signed by MD attached to patients DC packet.Ambulance transport requested for patient.  CSW signing off.   Final next level of care: Skilled Nursing Facility Barriers to Discharge: No Barriers Identified   Patient Goals and CMS Choice     Choice offered to / list presented to : Adult Children Blaine Bump daughter)      Discharge Placement              Patient chooses bed at: [facility] Patient to be transferred to facility by: [facility] Name of family member notified: Blaine Bump Patient and family notified of of transfer: 06/12/23  Discharge Plan and Services Additional resources added to the After Visit Summary for   In-house Referral: Clinical Social Work   Post Acute Care Choice: Skilled Nursing Facility                               Social Drivers of Health (SDOH) Interventions SDOH Screenings   Food Insecurity: Patient Unable To Answer (06/01/2023)  Housing: Patient Unable To Answer (06/01/2023)  Transportation Needs: Patient Unable To Answer (06/01/2023)  Utilities: Patient Unable To Answer (06/01/2023)  Depression (PHQ2-9): Medium Risk (12/03/2018)  Financial Resource Strain: Low Risk  (01/15/2018)  Physical Activity: Inactive (01/15/2018)  Social Connections: Unknown (06/01/2023)  Stress: Stress Concern Present  (01/15/2018)  Tobacco Use: Low Risk  (05/30/2023)     Readmission Risk Interventions     No data to display

## 2023-06-12 NOTE — Discharge Summary (Signed)
 Physician Discharge Summary  Carolyn Parrish ZOX:[id number] DOB: 12/01/1940 DOA: 05/30/2023  PCP: Pcp, No  Admit date: 05/30/2023 Discharge date: 06/12/2023  Admitted From: Skilled nursing facility  Discharge disposition: Skilled nursing facility  Recommendations for Outpatient Follow-Up:   Follow up with your primary care provider at the skilled nursing facility in 3 to 5 days Check CBC, BMP, magnesium  in the next visit  Discharge Diagnosis:   Principal Problem:   Seizure (HCC) Active Problems:   Protein-calorie malnutrition, severe   Discharge Condition: Improved.  Diet recommendation: Mechanically soft moist food  Wound care: None.  Code status: DNR   History of Present Illness:   Carolyn Parrish is a 83 y.o. female with past medical history of CVA with residual weakness and deficits, dementia, functional decline, breast CA s/p XRT and mastectomy 2020 currently residing at [facility] was initially admitted to the ICU as code stroke.  Patient had seizure-like activity in the setting of severe hypoglycemia.  Self extubated on 05/31/2023.  Patient had low-grade fever, worsening leukocytosis and was on IV vasopressors.  Urine cultures were positive for E. coli.  Subsequently, patient was transferred out of the ICU when her blood pressures were stable.   Hospital Course:   Following conditions were addressed during hospitalization as listed below,  New onset seizure  suspected secondary to hypoglycemia. No further seizures.  Will continue to monitor closely.  Seizure precautions.  Latest POC glucose of 141.  Patient should be encouraged to keep up with nutrition and supplements.   Metabolic encephalopathy.  Mild aphasic.  Not much interactive.   Hypoglycemia: Resolved.    Currently on dysphagia 2 diet.  Latest POC glucose of 141.  Was on nocturnal tube feeds during hospitalization.   Acute respiratory failure with inability to protect airway S/p extubation.   Currently on room air.   Sepsis secondary to E. coli UTI. E.coli bacteremia from blood cultures 05/30/23 Off vasopressors.  Completed course of IV Rocephin ..   Temperature max of 98.7 F.  Latest blood pressure of 110/47   Hypokalemia - resolved.  Latest potassium of 4.4.   Hypocalcemia improved.  Latest calcium  of 9.2.   Hypophosphatemia- improved.  Latest phosphorus of 2.9   Essential hypertension: Blood pressure better at this time-105/50   History of CVA with residual weakness.  PT has recommended skilled nursing facility placement on discharge.    History of CHF- 2 D echocardiogram within normal limits, currently compensated.   Severe malnutrition Hypoalbuminemia Body mass index is 23.27 kg/m.  Nutrition Status: Nutrition Problem: Severe Malnutrition Etiology: chronic illness Signs/Symptoms: severe muscle depletion, severe fat depletion Interventions: Tube feeding, Prostat, MVI  Dietitian followed the patient during hospitalization.   On dysphagia 2 diet.  Patient underwent 48-hour calorie count with decreased calorie intake.  Family not inclined towards feeding tube.    Patient would not want a feeding tube as well..    Cortrak tube tube has been discontinued prior to discharge.  Encourage oral nutrition on discharge.   Deep tissue injury of the medial buttocks.  Present on admission.  Continue pressure ulcer prevention protocol.   Disposition.  At this time, patient is stable for disposition to skilled nursing facility.  Spoke with the patient's daughter prior to disposition.  Medical Consultants:   PCCM  Procedures:    Cortrak tube tube placement and removal  Subjective:   Today, patient was seen and examined at bedside.  Patient denies interval complaints.  Denies any nausea vomiting abdominal pain.  Discharge Exam:   Vitals:   06/11/23 2327 06/12/23 0435  BP: (!) 105/49 (!) 105/50  Pulse: 81 83  Resp: 13 16  Temp: 98.7 F (37.1 C) 98.4 F (36.9 C)   SpO2: 100% 100%   Vitals:   06/11/23 1624 06/11/23 2100 06/11/23 2327 06/12/23 0435  BP: (!) 107/51 (!) 112/58 (!) 105/49 (!) 105/50  Pulse:  83 81 83  Resp: 17 13 13 16   Temp: 97.7 F (36.5 C) 98.1 F (36.7 C) 98.7 F (37.1 C) 98.4 F (36.9 C)  TempSrc: Oral Oral Oral Oral  SpO2:  100% 100% 100%  Weight:    65.4 kg  Height:       General: Alert awake, not in obvious distress, elderly female, not much interactive, appears weak and deconditioned. HENT: pupils equally reacting to light,  No scleral pallor or icterus noted. Oral mucosa is moist.  Chest:   Diminished breath sounds bilaterally. No crackles or wheezes.  CVS: S1 &S2 heard. No murmur.  Regular rate and rhythm. Abdomen: Soft, nontender, nondistended.  Bowel sounds are heard.   Extremities: No cyanosis, clubbing or edema.  Peripheral pulses are palpable. Psych: Alert, awake, flat affect CNS: Weakness of the extremities, more on the right than the left.  Follows commands.   Skin: Warm and dry.  Deep tissue injury on the back  The results of significant diagnostics from this hospitalization (including imaging, microbiology, ancillary and laboratory) are listed below for reference.     Diagnostic Studies:   DG Abd Portable 1V Result Date: 05/31/2023 CLINICAL DATA:  Feeding tube placement. EXAM: PORTABLE ABDOMEN - 1 VIEW COMPARISON:  CT abdomen pelvis dated 01/08/2018. FINDINGS: Enteric tube with tip in the proximal stomach. Right upper quadrant cholecystectomy and a biliary stent noted. IMPRESSION: Enteric tube with tip in the proximal stomach. Electronically Signed   By: Angus Bark M.D.   On: 05/31/2023 09:23   Overnight EEG with video Result Date: 05/31/2023 Arleene Lack, MD     05/31/2023  4:17 PM Patient Name: Carolyn Parrish MRN: [id number] Epilepsy Attending: Arleene Lack Referring Physician/Provider: Eleni Griffin, MD Duration: 05/30/2023 1103 to 05/31/2023 9528 Patient history: 82yo f with seizures in  setting of hypoglycemia. EEG to evaluate for seizure Level of alertness: comatose/ lethargic AEDs during EEG study: Versed  Technical aspects: This EEG study was done with scalp electrodes positioned according to the 10-20 International system of electrode placement. Electrical activity was reviewed with band pass filter of 1-70Hz , sensitivity of 7 uV/mm, display speed of 88mm/sec with a 60Hz  notched filter applied as appropriate. EEG data were recorded continuously and digitally stored.  Video monitoring was available and reviewed as appropriate. Description: EEG initially showed continuous generalized and lateralized left hemisphere 3 to 6 Hz theta-delta slowing admixed with 13-15hz  beta activity. Gradually EEG improved and showed posterior dominant rhythm of 7.5 Hz activity of moderate voltage (25-35 uV) seen predominantly in posterior head regions, symmetric and reactive to eye opening and eye closing. Sleep was characterized by vertex waves, sleep spindles (12 to 14 Hz), maximal frontocentral region. EEG also showed near continuous 3-6hz  theta-delta slowing in left hemisphere. Hyperventilation and photic stimulation were not performed.   ABNORMALITY - Continuous slow, generalized and lateralized left hemisphere IMPRESSION: This study is suggestive of cortical dysfunction arising from left hemisphere likely secondary to underlying structural abnormality, post-ictal state. Additionally there was moderate to severe diffuse encephalopathy likely due to sedation which gradually resolved. No seizures or epileptiform discharges were seen  throughout the recording. Arleene Lack   DG Chest Port 1 View Result Date: 05/31/2023 CLINICAL DATA:  Seizure.  Status post intubation. EXAM: PORTABLE CHEST 1 VIEW COMPARISON:  05/30/2023 FINDINGS: Endotracheal tube tip is above the carina. There is an enteric tube which courses below the level of the GE junction. Heart size and mediastinal contours appear normal. Unchanged left  apical pleuroparenchymal scarring. No pleural effusion or airspace consolidation. IMPRESSION: 1. Satisfactory position of endotracheal tube and enteric tube. 2. No acute cardiopulmonary abnormalities. Electronically Signed   By: Kimberley Penman M.D.   On: 05/31/2023 06:17   MR BRAIN W WO CONTRAST Result Date: 05/31/2023 CLINICAL DATA:  Transient ischemic attack EXAM: MRI HEAD WITHOUT AND WITH CONTRAST TECHNIQUE: Multiplanar, multiecho pulse sequences of the brain and surrounding structures were obtained without and with intravenous contrast. CONTRAST:  7mL GADAVIST  GADOBUTROL  1 MMOL/ML IV SOLN COMPARISON:  None Available. FINDINGS: Brain: No acute infarct, mass effect or extra-axial collection. No acute or chronic hemorrhage. Cerebellar atrophy with ex vacuo dilatation of the surrounding CSF space. The midline structures are normal. There is no abnormal contrast enhancement. Vascular: Normal flow voids. Skull and upper cervical spine: Normal calvarium and skull base. Visualized upper cervical spine and soft tissues are normal. Sinuses/Orbits:No paranasal sinus fluid levels or advanced mucosal thickening. No mastoid or middle ear effusion. Normal orbits. There is a small amount of fluid layering in the nasopharynx. IMPRESSION: 1. No acute intracranial abnormality. 2. Cerebellar atrophy with ex vacuo dilatation of the surrounding CSF space. Electronically Signed   By: Juanetta Nordmann M.D.   On: 05/31/2023 03:44   DG Chest Portable 1 View Result Date: 05/30/2023 CLINICAL DATA:  Endotracheal tube placement. EXAM: PORTABLE CHEST 1 VIEW COMPARISON:  January 08, 2018. FINDINGS: Stable cardiomediastinal silhouette. Endotracheal and nasogastric tubes are in grossly good position. Lungs are clear. Bony thorax is unremarkable. IMPRESSION: Support apparatus as noted above. No acute cardiopulmonary abnormality seen. Electronically Signed   By: Rosalene Colon M.D.   On: 05/30/2023 09:18   CT HEAD CODE STROKE WO  CONTRAST Result Date: 05/30/2023 CLINICAL DATA:  Code stroke. Neuro deficit, right-sided gaze, altered mental status. EXAM: CT HEAD WITHOUT CONTRAST TECHNIQUE: Contiguous axial images were obtained from the base of the skull through the vertex without intravenous contrast. RADIATION DOSE REDUCTION: This exam was performed according to the departmental dose-optimization program which includes automated exposure control, adjustment of the mA and/or kV according to patient size and/or use of iterative reconstruction technique. COMPARISON:  12/04/2021. FINDINGS: Brain: No acute intracranial hemorrhage. No CT evidence of acute infarct. Nonspecific hypoattenuation in the periventricular and subcortical white matter favored to reflect chronic microvascular ischemic changes. Mild parenchymal volume loss. No edema, mass effect, or midline shift. The basilar cisterns are patent. Ventricles: Prominence of the ventricles suggesting underlying parenchymal volume loss. Vascular: Atherosclerotic calcifications of the carotid siphons. No hyperdense vessel. Skull: No acute or aggressive finding. Orbits: Orbits are symmetric. Sinuses: Mild mucosal thickening in the ethmoid sinuses. Other: Mastoid air cells are clear. Partially visualized nasal airway. ASPECTS [facility] Stroke Program Early CT Score) - Ganglionic level infarction (caudate, lentiform nuclei, internal capsule, insula, M1-M3 cortex): 7 - Supraganglionic infarction (M4-M6 cortex): 3 Total score (0-10 with 10 being normal): 10 IMPRESSION: 1. No CT evidence of acute intracranial abnormality. 2. ASPECTS is 10 These results were communicated to Dr. Doretta Gant At 8:44 am on 05/30/2023 by text page via the [facility] messaging system. Electronically Signed   By: Aldine Humphreys.D.  On: 05/30/2023 08:44     Labs:   Basic Metabolic Panel: Recent Labs  Lab 06/06/23 0456 06/07/23 0500 06/10/23 0300  NA 141 141 139  K 4.5 4.4 4.4  CL 107 106 103  CO2 24 26 29   GLUCOSE 118*  135* 123*  BUN 22 22 24*  CREATININE 0.44 0.49 0.44  CALCIUM  9.3 9.0 9.2  MG  --  2.2 2.3  PHOS 3.1 2.9  --    GFR Estimated Creatinine Clearance: 52.5 mL/min (by C-G formula based on SCr of 0.44 mg/dL). Liver Function Tests: No results for input(s): "AST", "ALT", "ALKPHOS", "BILITOT", "PROT", "ALBUMIN " in the last 168 hours. No results for input(s): "LIPASE", "AMYLASE" in the last 168 hours. No results for input(s): "AMMONIA" in the last 168 hours. Coagulation profile No results for input(s): "INR", "PROTIME" in the last 168 hours.  CBC: Recent Labs  Lab 06/06/23 0819 06/07/23 0500 06/10/23 0300  WBC 10.6* 10.4 10.6*  HGB 8.5* 8.6* 8.7*  HCT 27.4* 27.8* 28.5*  MCV 87.5 88.0 88.0  PLT 366 386 437*   Cardiac Enzymes: No results for input(s): "CKTOTAL", "CKMB", "CKMBINDEX", "TROPONINI" in the last 168 hours. BNP: Invalid input(s): "POCBNP" CBG: Recent Labs  Lab 06/11/23 1220 06/11/23 1626 06/11/23 2107 06/11/23 2334 06/12/23 0437  GLUCAP 85 88 103* 115* 141*   D-Dimer No results for input(s): "DDIMER" in the last 72 hours. Hgb A1c No results for input(s): "HGBA1C" in the last 72 hours. Lipid Profile No results for input(s): "CHOL", "HDL", "LDLCALC", "TRIG", "CHOLHDL", "LDLDIRECT" in the last 72 hours. Thyroid function studies No results for input(s): "TSH", "T4TOTAL", "T3FREE", "THYROIDAB" in the last 72 hours.  Invalid input(s): "FREET3" Anemia work up No results for input(s): "VITAMINB12", "FOLATE", "FERRITIN", "TIBC", "IRON", "RETICCTPCT" in the last 72 hours. Microbiology No results found for this or any previous visit (from the past 240 hours).   Discharge Instructions:   Discharge Instructions     Discharge instructions   Complete by: As directed    Follow-up with your primary care provider at the skilled nursing facility in 3 to 5 days.  Check blood work at that time.  Encourage nutrition by mouth.   Discharge wound care:   Complete by: As  directed    Deep tissue pressure injury care in the buttocks.   Increase activity slowly   Complete by: As directed       Allergies as of 06/12/2023       Reactions   Atorvastatin    UNSPECIFIED REACTION         Medication List     STOP taking these medications    camphor-menthol lotion Commonly known as: SARNA   docusate sodium  100 MG capsule Commonly known as: COLACE   magnesium  hydroxide 400 MG/5ML suspension Commonly known as: MILK OF MAGNESIA   OLANZapine  5 MG tablet Commonly known as: ZYPREXA    oxyCODONE  5 MG immediate release tablet Commonly known as: Oxy IR/ROXICODONE    promethazine 12.5 MG tablet Commonly known as: PHENERGAN   RA Saline Enema 19-7 GM/118ML Enem   vitamin B-12 500 MCG tablet Commonly known as: CYANOCOBALAMIN        TAKE these medications    acetaminophen  500 MG tablet Commonly known as: TYLENOL  Take 500 mg by mouth every 4 (four) hours as needed.   alum & mag hydroxide-simeth 200-200-20 MG/5ML suspension Commonly known as: MAALOX/MYLANTA Take 30 mLs by mouth every 4 (four) hours as needed for indigestion or heartburn.   anastrozole  1 MG tablet  Commonly known as: ARIMIDEX  Take 1 tablet (1 mg total) by mouth daily.   aspirin  EC 81 MG tablet Take 1 tablet (81 mg total) by mouth daily.   bisacodyl  10 MG suppository Commonly known as: DULCOLAX Place 1 suppository (10 mg total) rectally as needed for moderate constipation or severe constipation. What changed: reasons to take this   DIVALPROEX  SODIUM ER PO Take 125 mg by mouth daily. Give 4 capsules by mouth daily (500 mg)   ENSURE ENLIVE PO Take 237 mLs by mouth 4 (four) times daily.   famotidine  20 MG tablet Commonly known as: PEPCID  Take 20 mg by mouth daily.   feeding supplement (PRO-STAT SUGAR FREE 64) Liqd Take 30 mLs by mouth 3 (three) times daily with meals.   multivitamin with minerals Tabs tablet Take 1 tablet by mouth daily. Start taking on: Jun 13, 2023    polyethylene glycol 17 g packet Commonly known as: MIRALAX  / GLYCOLAX  Take 17 g by mouth daily.   sennosides-docusate sodium  8.6-50 MG tablet Commonly known as: SENOKOT-S Take 1 tablet by mouth at bedtime.   traZODone  50 MG tablet Commonly known as: DESYREL  Take 0.5 tablets (25 mg total) by mouth at bedtime as needed for sleep.   Vitamin D3 25 MCG (1000 UT) Caps Take 1 capsule (1,000 Units total) by mouth daily.               Discharge Care Instructions  (From admission, onward)           Start     Ordered   06/12/23 0000  Discharge wound care:       Comments: Deep tissue pressure injury care in the buttocks.   06/12/23 1204             Time coordinating discharge: 39 minutes  Signed:  Tharon Bomar  [facility] 06/12/2023, 12:12 PM

## 2023-06-12 NOTE — NC FL2 (Signed)
 [place]  MEDICAID FL2 LEVEL OF CARE FORM     IDENTIFICATION  Patient Name: Carolyn Parrish Birthdate: March 24, 1940 Sex: female Admission Date (Current Location): 05/30/2023  [facility] and [place] Number:  Producer, television/film/video and Address:  [facility] [place], [address] [place], [place] [address]      Provider Number: [id number]  Attending Physician Name and Address:  Rosena Conradi, MD  Relative Name and Phone Number:  Blaine Bump (daughter) [phone]    Current Level of Care: Hospital Recommended Level of Care: Skilled Nursing Facility Prior Approval Number:    Date Approved/Denied:   PASRR Number: [id number]  Discharge Plan: SNF    Current Diagnoses: Patient Active Problem List   Diagnosis Date Noted   Protein-calorie malnutrition, severe 05/31/2023   Seizure (HCC) 05/30/2023   Adult failure to thrive 09/07/2020   Dyslipidemia 01/23/2019   Normochromic normocytic anemia 01/23/2019   Vitamin D  deficiency 01/23/2019   Hypertensive heart disease with heart failure (HCC) 10/17/2018   Essential hypertension 08/01/2018   Vascular dementia (HCC) 01/22/2018   Acute hypernatremia 01/09/2018   Acute encephalopathy 01/08/2018   Dehydration    Sepsis (HCC) 11/14/2017   Septic shock (HCC) 11/14/2017   AKI (acute kidney injury) (HCC)    Lactic acidosis    Bipolar disorder (HCC)    History of completed stroke    Breast cancer, stage 2, left (HCC) 10/30/2017   Encounter for central line placement 09/12/2017   History of stroke 09/12/2017   DM (diabetes mellitus), type 2 with peripheral vascular complications (HCC) 09/12/2017   Schizoaffective disorder (HCC) 09/12/2017   Malignant neoplasm of upper-inner quadrant of left breast in female, estrogen receptor positive (HCC) 09/07/2017    Orientation RESPIRATION BLADDER Height & Weight     Self, Time, Situation  Normal Continent, External catheter (External Urinary Catheter) Weight: 144 lb 2.9 oz  (65.4 kg) Height:  5\' 6"  (167.6 cm)  BEHAVIORAL SYMPTOMS/MOOD NEUROLOGICAL BOWEL NUTRITION STATUS      Incontinent Diet (Please see discharge summary)  AMBULATORY STATUS COMMUNICATION OF NEEDS Skin   Extensive Assist Verbally (difficulty speaking) Other (Comment) (Ecchymosis,Leg,Bil.,Erythema,arm,Bil.,Wound/Incision LDAs,PI buttocks,medial,deep tissue,wound/Incision open or dehisced buttocks,R,small,Wound/Incision open or dehisced arm,anterior,lower,R,IV extravasation site)                       Personal Care Assistance Level of Assistance  Bathing, Feeding, Dressing Bathing Assistance: Maximum assistance Feeding assistance: Maximum assistance Dressing Assistance: Maximum assistance     Functional Limitations Info  Hearing, Speech   Hearing Info: Adequate Speech Info:  (Difficulty speaking)    SPECIAL CARE FACTORS FREQUENCY  PT (By licensed PT), OT (By licensed OT)     PT Frequency: 5x min weekly OT Frequency: 5x min weekly            Contractures Contractures Info: Not present    Additional Factors Info  Code Status, Allergies Code Status Info: DNR Allergies Info: Atorvastatin           Current Medications (06/12/2023):  This is the current hospital active medication list Current Facility-Administered Medications  Medication Dose Route Frequency Provider Last Rate Last Admin   acetaminophen  (TYLENOL ) tablet 650 mg  650 mg Oral Q4H PRN Aisha Hove, MD   650 mg at 06/11/23 2127   anastrozole  (ARIMIDEX ) tablet 1 mg  1 mg Oral Daily Sreeram, Narendranath, MD   1 mg at 06/12/23 1100   aspirin  chewable tablet 81 mg  81 mg Oral Daily Sreeram, Narendranath,  MD   81 mg at 06/12/23 1100   docusate sodium  (COLACE) capsule 100 mg  100 mg Oral BID PRN Bowser, Grace E, NP       feeding supplement (ENSURE ENLIVE / ENSURE PLUS) liquid 237 mL  237 mL Oral BID BM Sreeram, Narendranath, MD   237 mL at 06/12/23 1100   feeding supplement (OSMOLITE 1.5 CAL) liquid 1,000 mL   1,000 mL Per Tube QHS Aisha Hove, MD   1,000 mL at 06/11/23 2126   heparin  injection 5,000 Units  5,000 Units Subcutaneous Q8H Bowser, Grace E, NP   5,000 Units at 06/12/23 4098   multivitamin with minerals tablet 1 tablet  1 tablet Oral Daily Sreeram, Narendranath, MD   1 tablet at 06/12/23 1100   ondansetron  (ZOFRAN ) injection 4 mg  4 mg Intravenous Q6H PRN Bowser, Grace E, NP       Oral care mouth rinse  15 mL Mouth Rinse 4 times per day Arlina Lair, MD   15 mL at 06/12/23 1127   Oral care mouth rinse  15 mL Mouth Rinse PRN Agarwala, Martha Slack, MD       phenol (CHLORASEPTIC) mouth spray 1 spray  1 spray Mouth/Throat PRN Sreeram, Narendranath, MD   1 spray at 06/06/23 0902   polyethylene glycol (MIRALAX  / GLYCOLAX ) packet 17 g  17 g Oral Daily PRN Bowser, Grace E, NP       sodium chloride  flush (NS) 0.9 % injection 3 mL  3 mL Intravenous Q12H Merdis Stalling, MD   3 mL at 06/12/23 1127   sodium chloride  flush (NS) 0.9 % injection 3 mL  3 mL Intravenous PRN Merdis Stalling, MD       traZODone  (DESYREL ) tablet 25 mg  25 mg Oral QHS PRN Mansy, Jan A, MD   25 mg at 06/11/23 2127     Discharge Medications: Please see discharge summary for a list of discharge medications.  Relevant Imaging Results:  Relevant Lab Results:   Additional Information [id number]  Carmon Christen, LCSWA

## 2023-06-12 NOTE — Progress Notes (Signed)
 Coretrak tube removed per MD order in preparation for discharge to SNF.  Patient tolerated well.

## 2023-06-12 NOTE — Progress Notes (Signed)
 Nutrition Follow-up  DOCUMENTATION CODES:  Severe malnutrition in context of chronic illness  INTERVENTION:  Ensure Enlive po BID, each supplement provides 350 kcal and 20 grams of protein. Continue nocturnal TF via Cortrak: Osmolite 1.5 at 40ml x 12 hours (480ml per day) 60ml ProSource TF20 BID Provides 880 kcal (59% minimum estimated needs), 70g protein (74% minimum estimated needs), free water  daily Meal ordering with assist  NUTRITION DIAGNOSIS:  Severe Malnutrition related to chronic illness as evidenced by severe muscle depletion, severe fat depletion. - remains applicable  GOAL:  Patient will meet greater than or equal to 90% of their needs - goal unmet, addressing via diet advancement, TF and nutrition supplements  MONITOR:   PO intake, Supplement acceptance, Labs, I & O's, Weight trends  REASON FOR ASSESSMENT:   Consult Enteral/tube feeding initiation and management  ASSESSMENT:   Pt with PMH of prior CVA with residual weakness and deficits, schizoaffective disorder, CHF, DM, dementia, functional decline, breast ca s/p XRT and mastectomy 2020 who lives at [facility] admitted for seizures suspected due to hypoglycemia.  4/25: cortrak placed 4/26: s/p MBS- diet advanced to dysphagia 1, honey thick liquids 4/28: diet upgraded to dysphagia 3, thin liquids 4/29: diet downgraded to dysphagia 2 for ease of chewing (edentulous) 5/02: FMS removed  Met with pt and RN in room. Pt only shakes head to a few questions. RN shares that pt did not eat breakfast this morning because she did not like the food. Pt currently on automatic trays, RD shared to change to assist for pt to help order meals. RN reports that pt just drank an entire Ensure. Pt denies any nausea or vomiting.   Per MD notes, plan to discontinue Cortrak prior to discharge.   Meal completions: 4/26: 10% lunch, 30% dinner 4/27: 75% breakfast, 25% lunch 4/28: 30% breakfast, 75% lunch 4/29: 40%  breakfast 5/01: 20% lunch, 30% dinner 5/02: 75% lunch, 75% dinner 5/04: 25% breakfast, 25% lunch 5/05: 25 breakfast, 50% lunch  Admit weight: 64.4 kg Current weight: 65.4 kg (5/6)  Medications: MVI Labs:   CBG's 85-141 x24 hours  Diet Order:   Diet Order             DIET DYS 2 Room service appropriate? Yes with Assist; Fluid consistency: Thin  Diet effective now                   EDUCATION NEEDS:   No education needs have been identified at this time  Skin:  Skin Assessment: Skin Integrity Issues: Skin Integrity Issues:: DTI DTI: buttocks  Last BM:  5/5  Height:  Ht Readings from Last 1 Encounters:  05/30/23 5\' 6"  (1.676 m)   Weight:  Wt Readings from Last 1 Encounters:  06/12/23 65.4 kg   BMI:  Body mass index is 23.27 kg/m.  Estimated Nutritional Needs:  Kcal:  1500-1700 Protein:  95-105 grams Fluid:  >1.5 L/day   Doneta Furbish RD, LDN Clinical Dietitian

## 2023-09-02 ENCOUNTER — Other Ambulatory Visit: Payer: Self-pay

## 2023-09-02 ENCOUNTER — Emergency Department (HOSPITAL_COMMUNITY)

## 2023-09-02 ENCOUNTER — Observation Stay (HOSPITAL_COMMUNITY)

## 2023-09-02 ENCOUNTER — Inpatient Hospital Stay (HOSPITAL_COMMUNITY)
Admission: EM | Admit: 2023-09-02 | Discharge: 2023-09-09 | DRG: 698 | Disposition: A | Discharge status: Skilled Nursing Facility | Source: Skilled Nursing Facility | Attending: Infectious Diseases | Admitting: Infectious Diseases

## 2023-09-02 DIAGNOSIS — R7989 Other specified abnormal findings of blood chemistry: Secondary | ICD-10-CM

## 2023-09-02 DIAGNOSIS — T83511A Infection and inflammatory reaction due to indwelling urethral catheter, initial encounter: Secondary | ICD-10-CM | POA: Diagnosis not present

## 2023-09-02 DIAGNOSIS — Z923 Personal history of irradiation: Secondary | ICD-10-CM

## 2023-09-02 DIAGNOSIS — Z7982 Long term (current) use of aspirin: Secondary | ICD-10-CM

## 2023-09-02 DIAGNOSIS — Z888 Allergy status to other drugs, medicaments and biological substances status: Secondary | ICD-10-CM

## 2023-09-02 DIAGNOSIS — G40909 Epilepsy, unspecified, not intractable, without status epilepticus: Secondary | ICD-10-CM | POA: Diagnosis present

## 2023-09-02 DIAGNOSIS — K5900 Constipation, unspecified: Secondary | ICD-10-CM | POA: Diagnosis present

## 2023-09-02 DIAGNOSIS — G9341 Metabolic encephalopathy: Secondary | ICD-10-CM | POA: Diagnosis present

## 2023-09-02 DIAGNOSIS — B964 Proteus (mirabilis) (morganii) as the cause of diseases classified elsewhere: Secondary | ICD-10-CM | POA: Diagnosis present

## 2023-09-02 DIAGNOSIS — I10 Essential (primary) hypertension: Secondary | ICD-10-CM | POA: Diagnosis present

## 2023-09-02 DIAGNOSIS — A419 Sepsis, unspecified organism: Principal | ICD-10-CM

## 2023-09-02 DIAGNOSIS — Z9012 Acquired absence of left breast and nipple: Secondary | ICD-10-CM

## 2023-09-02 DIAGNOSIS — E11649 Type 2 diabetes mellitus with hypoglycemia without coma: Secondary | ICD-10-CM | POA: Diagnosis present

## 2023-09-02 DIAGNOSIS — Z79899 Other long term (current) drug therapy: Secondary | ICD-10-CM

## 2023-09-02 DIAGNOSIS — R6520 Severe sepsis without septic shock: Secondary | ICD-10-CM | POA: Diagnosis present

## 2023-09-02 DIAGNOSIS — Z79811 Long term (current) use of aromatase inhibitors: Secondary | ICD-10-CM

## 2023-09-02 DIAGNOSIS — A4159 Other Gram-negative sepsis: Secondary | ICD-10-CM | POA: Diagnosis present

## 2023-09-02 DIAGNOSIS — E876 Hypokalemia: Secondary | ICD-10-CM | POA: Diagnosis present

## 2023-09-02 DIAGNOSIS — Z66 Do not resuscitate: Secondary | ICD-10-CM | POA: Diagnosis present

## 2023-09-02 DIAGNOSIS — E785 Hyperlipidemia, unspecified: Secondary | ICD-10-CM | POA: Diagnosis present

## 2023-09-02 DIAGNOSIS — Y731 Therapeutic (nonsurgical) and rehabilitative gastroenterology and urology devices associated with adverse incidents: Secondary | ICD-10-CM | POA: Diagnosis present

## 2023-09-02 DIAGNOSIS — N390 Urinary tract infection, site not specified: Secondary | ICD-10-CM | POA: Diagnosis not present

## 2023-09-02 DIAGNOSIS — K639 Disease of intestine, unspecified: Secondary | ICD-10-CM

## 2023-09-02 DIAGNOSIS — F0390 Unspecified dementia without behavioral disturbance: Secondary | ICD-10-CM | POA: Diagnosis present

## 2023-09-02 DIAGNOSIS — Z9049 Acquired absence of other specified parts of digestive tract: Secondary | ICD-10-CM

## 2023-09-02 DIAGNOSIS — Z87440 Personal history of urinary (tract) infections: Secondary | ICD-10-CM

## 2023-09-02 DIAGNOSIS — K219 Gastro-esophageal reflux disease without esophagitis: Secondary | ICD-10-CM | POA: Diagnosis present

## 2023-09-02 DIAGNOSIS — Z853 Personal history of malignant neoplasm of breast: Secondary | ICD-10-CM

## 2023-09-02 DIAGNOSIS — R4182 Altered mental status, unspecified: Secondary | ICD-10-CM | POA: Diagnosis present

## 2023-09-02 LAB — URINALYSIS, W/ REFLEX TO CULTURE (INFECTION SUSPECTED)
Bilirubin Urine: NEGATIVE
Glucose, UA: NEGATIVE mg/dL
Ketones, ur: 5 mg/dL — AB
Nitrite: POSITIVE — AB
Protein, ur: 100 mg/dL — AB
Specific Gravity, Urine: 1.017 (ref 1.005–1.030)
WBC, UA: >50 WBC/hpf (ref 0–5)
pH: 7 (ref 5.0–8.0)

## 2023-09-02 LAB — COMPREHENSIVE METABOLIC PANEL WITH GFR
ALT: 67 U/L — ABNORMAL HIGH (ref 0–44)
AST: 52 U/L — ABNORMAL HIGH (ref 15–41)
Albumin: 2.4 g/dL — ABNORMAL LOW (ref 3.5–5.0)
Alkaline Phosphatase: 274 U/L — ABNORMAL HIGH (ref 38–126)
Anion gap: 10 (ref 5–15)
BUN: 17 mg/dL (ref 8–23)
CO2: 21 mmol/L — ABNORMAL LOW (ref 22–32)
Calcium: 8.3 mg/dL — ABNORMAL LOW (ref 8.9–10.3)
Chloride: 110 mmol/L (ref 98–111)
Creatinine, Ser: 0.61 mg/dL (ref 0.44–1.00)
GFR, Estimated: >60 mL/min (ref >60)
Glucose, Bld: 100 mg/dL — ABNORMAL HIGH (ref 70–99)
Potassium: 4.7 mmol/L (ref 3.5–5.1)
Sodium: 141 mmol/L (ref 135–145)
Total Bilirubin: 1 mg/dL (ref 0.0–1.2)
Total Protein: 6.4 g/dL — ABNORMAL LOW (ref 6.5–8.1)

## 2023-09-02 LAB — CBC WITH DIFFERENTIAL/PLATELET
Abs Immature Granulocytes: 0 K/uL (ref 0.00–0.07)
Basophils Absolute: 0.1 K/uL (ref 0.0–0.1)
Basophils Relative: 1 %
Eosinophils Absolute: 0 K/uL (ref 0.0–0.5)
Eosinophils Relative: 0 %
HCT: 36.4 % (ref 36.0–46.0)
Hemoglobin: 11.2 g/dL — ABNORMAL LOW (ref 12.0–15.0)
Lymphocytes Relative: 12 %
Lymphs Abs: 1.8 K/uL (ref 0.7–4.0)
MCH: 27.3 pg (ref 26.0–34.0)
MCHC: 30.8 g/dL (ref 30.0–36.0)
MCV: 88.8 fL (ref 80.0–100.0)
Monocytes Absolute: 0.6 K/uL (ref 0.1–1.0)
Monocytes Relative: 4 %
Neutro Abs: 12.3 K/uL — ABNORMAL HIGH (ref 1.7–7.7)
Neutrophils Relative %: 83 %
Platelets: 448 K/uL — ABNORMAL HIGH (ref 150–400)
RBC: 4.1 MIL/uL (ref 3.87–5.11)
RDW: 22.4 % — ABNORMAL HIGH (ref 11.5–15.5)
WBC: 14.8 K/uL — ABNORMAL HIGH (ref 4.0–10.5)
nRBC: 0 % (ref 0.0–0.2)
nRBC: 0 /100{WBCs}

## 2023-09-02 LAB — TROPONIN I (HIGH SENSITIVITY)
Troponin I (High Sensitivity): 10 ng/L (ref <18)
Troponin I (High Sensitivity): 9 ng/L (ref <18)

## 2023-09-02 LAB — PROTIME-INR
INR: 1 (ref 0.8–1.2)
Prothrombin Time: 14.2 s (ref 11.4–15.2)

## 2023-09-02 LAB — RESP PANEL BY RT-PCR (RSV, FLU A&B, COVID)  RVPGX2
Influenza A by PCR: NEGATIVE
Influenza B by PCR: NEGATIVE
Resp Syncytial Virus by PCR: NEGATIVE
SARS Coronavirus 2 by RT PCR: NEGATIVE

## 2023-09-02 LAB — I-STAT CG4 LACTIC ACID, ED
Lactic Acid, Venous: 2.5 mmol/L (ref 0.5–1.9)
Lactic Acid, Venous: 2 mmol/L (ref 0.5–1.9)

## 2023-09-02 MED ORDER — HALOPERIDOL LACTATE 5 MG/ML IJ SOLN: 2.0000 mg | Freq: Once | INTRAMUSCULAR | Status: DC

## 2023-09-02 MED ORDER — ANASTROZOLE 1 MG PO TABS
1.0000 mg | ORAL_TABLET | Freq: Every day | ORAL | Status: DC
  Administered 2023-09-04 – 2023-09-09 (×6): 1 mg via ORAL
  Filled 2023-09-02 (×7): qty 1

## 2023-09-02 MED ORDER — BISACODYL 10 MG RE SUPP: 10.0000 mg | Freq: Every day | RECTAL | Status: DC | PRN

## 2023-09-02 MED ORDER — ENOXAPARIN SODIUM 40 MG/0.4ML IJ SOSY
40.0000 mg | PREFILLED_SYRINGE | INTRAMUSCULAR | Status: DC
  Administered 2023-09-03 – 2023-09-04 (×2): 40 mg via SUBCUTANEOUS
  Filled 2023-09-02 (×2): qty 0.4

## 2023-09-02 MED ORDER — SODIUM CHLORIDE 0.9 % IV BOLUS (SEPSIS)
2000.0000 mL | Freq: Once | INTRAVENOUS | Status: AC
  Administered 2023-09-02: 2000 mL via INTRAVENOUS

## 2023-09-02 MED ORDER — SODIUM CHLORIDE 0.9 % IV SOLN
1.0000 g | INTRAVENOUS | Status: AC
  Administered 2023-09-03 – 2023-09-08 (×6): 1 g via INTRAVENOUS
  Filled 2023-09-02 (×7): qty 10

## 2023-09-02 MED ORDER — DIVALPROEX SODIUM 125 MG PO CSDR
250.0000 mg | DELAYED_RELEASE_CAPSULE | Freq: Two times a day (BID) | ORAL | Status: DC
  Administered 2023-09-03 – 2023-09-09 (×11): 250 mg via ORAL
  Filled 2023-09-02 (×12): qty 2

## 2023-09-02 MED ORDER — FAMOTIDINE 20 MG PO TABS
20.0000 mg | ORAL_TABLET | Freq: Every day | ORAL | Status: DC
Start: 2023-09-03 — End: 2023-09-09
  Administered 2023-09-04 – 2023-09-09 (×6): 20 mg via ORAL
  Filled 2023-09-02 (×6): qty 1

## 2023-09-02 MED ORDER — ACETAMINOPHEN 650 MG RE SUPP
650.0000 mg | Freq: Once | RECTAL | Status: AC
  Administered 2023-09-02: 650 mg via RECTAL
  Filled 2023-09-02: qty 1

## 2023-09-02 MED ORDER — SENNOSIDES-DOCUSATE SODIUM 8.6-50 MG PO TABS
1.0000 | ORAL_TABLET | Freq: Every day | ORAL | Status: DC
  Administered 2023-09-03: 1 via ORAL
  Filled 2023-09-02 (×5): qty 1

## 2023-09-02 MED ORDER — POLYETHYLENE GLYCOL 3350 17 G PO PACK
17.0000 g | PACK | Freq: Every day | ORAL | Status: DC
  Administered 2023-09-04 – 2023-09-05 (×2): 17 g via ORAL
  Filled 2023-09-02 (×2): qty 1

## 2023-09-02 MED ORDER — ASPIRIN 81 MG PO TBEC
81.0000 mg | DELAYED_RELEASE_TABLET | Freq: Every day | ORAL | Status: DC
Start: 2023-09-03 — End: 2023-09-09
  Administered 2023-09-04 – 2023-09-09 (×6): 81 mg via ORAL
  Filled 2023-09-02 (×6): qty 1

## 2023-09-02 MED ORDER — LACTATED RINGERS IV SOLN: INTRAVENOUS | Status: AC

## 2023-09-02 MED ORDER — HALOPERIDOL LACTATE 5 MG/ML IJ SOLN
2.0000 mg | Freq: Once | INTRAMUSCULAR | Status: AC
  Administered 2023-09-02: 2 mg via INTRAVENOUS
  Filled 2023-09-02: qty 1

## 2023-09-02 MED ORDER — SODIUM CHLORIDE 0.9 % IV SOLN
2.0000 g | Freq: Once | INTRAVENOUS | Status: AC
  Administered 2023-09-02: 2 g via INTRAVENOUS
  Filled 2023-09-02: qty 12.5

## 2023-09-02 NOTE — ED Triage Notes (Signed)
 Pt to ed from [facility] with a CC of AMS x 1 day via ems. Pt is typically A&Ox4 but has been acting off. Pt has a hx of UTI per daughter. Pt HR elevated. Pt co pain in pelvis.

## 2023-09-02 NOTE — ED Notes (Signed)
 Pt to CT

## 2023-09-02 NOTE — ED Notes (Signed)
 Pt becoming increasingly restless, notified by CT unable to tolerate CT scan, This RN notified EDP Britni PA

## 2023-09-02 NOTE — Progress Notes (Signed)
 Elink following for sepsis protocol.

## 2023-09-02 NOTE — H&P (Incomplete)
 Date: 09/03/2023               Patient Name:  Carolyn Parrish MRN: [id number]  DOB: 1940-06-01 Age / Sex: 83 y.o., female   PCP: Pcp, No         Medical Service: Internal Medicine Teaching Service         Attending Physician: Dr. Eben Reyes BROCKS, MD    First Contact: Dr. Armando Hora Pager: [phone]  Second Contact: Dr. Libby Blanch Pager: [phone]                  Chief Complaint: AMS  HPI limited due to patient's underlying mental status changes.  Information obtained from ED provider.  No family at bedside during evaluation.  Attempted to call patient's daughter, but she did not answer.  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  The patient is currently living at [facility] and rehabilitation center where she was noted to be febrile. The patient states that she has pain with urination.  Meds:  Current Meds  Medication Sig   acetaminophen  (TYLENOL ) 500 MG tablet Take 500 mg by mouth every 4 (four) hours as needed.   anastrozole  (ARIMIDEX ) 1 MG tablet Take 1 tablet (1 mg total) by mouth daily.   aspirin  EC 81 MG tablet Take 1 tablet (81 mg total) by mouth daily.   bisacodyl  (DULCOLAX) 10 MG suppository Place 1 suppository (10 mg total) rectally as needed for moderate constipation or severe constipation.   Cholecalciferol 25 MCG (1000 UT) tablet Take 1,000 Units by mouth daily.   divalproex  (DEPAKOTE  SPRINKLE) 125 MG capsule Take 500 mg by mouth daily. Give 4 capsules by mouth one time a day.   famotidine  (PEPCID ) 20 MG tablet Take 20 mg by mouth daily.   Multiple Vitamin (MULTIVITAMIN WITH MINERALS) TABS tablet Take 1 tablet by mouth daily.   polyethylene glycol (MIRALAX  / GLYCOLAX ) packet Take 17  g by mouth daily.   Protein (FEEDING SUPPLEMENT, PROSOURCE TF20,) liquid Place 30 mLs into feeding tube daily.   sennosides-docusate sodium  (SENOKOT-S) 8.6-50 MG tablet Take 1 tablet by mouth at bedtime.     Allergies: Allergies as of 09/02/2023 - Review Complete 09/02/2023  Allergen Reaction Noted   Atorvastatin  09/12/2017   Past Medical History:  Diagnosis Date   Cancer (HCC)    BREAST CANCER   CHF (congestive heart failure) (HCC)    DM (diabetes mellitus), secondary, with peripheral vascular complications (HCC)    Hyperlipidemia    Hypertension    Schizoaffective disorder (HCC)    Stroke (HCC)     Family History: Unable to obtain  Social History: Unable to obtain  Review of Systems: ROS limited due to patient's mental status.  Physical Exam: Blood pressure (!) 111/95, pulse 96, temperature 98.1 F (36.7 C), resp. rate 15, height 5' 6 (1.676 m), weight 65 kg, SpO2 100%.  Physical Exam Constitutional:      General: She is not in acute distress.    Appearance: Normal appearance. She is not toxic-appearing.  HENT:     Head: Normocephalic and atraumatic.  Eyes:     Extraocular Movements: Extraocular movements intact.     Conjunctiva/sclera: Conjunctivae normal.  Pupils: Pupils are equal, round, and reactive to light.  Cardiovascular:     Rate and Rhythm: Regular rhythm. Tachycardia present.     Pulses: Normal pulses.     Heart sounds: Normal heart sounds. No murmur heard.    No friction rub. No gallop.  Pulmonary:     Effort: Pulmonary effort is normal.     Breath sounds: Normal breath sounds.  Neurological:     Mental Status: She is disoriented.     Comments: The patient is able to follow some commands.  She is not oriented to name, month, year, place.    EKG: personally reviewed my interpretation is sinus tachycardia.  CXR: personally reviewed my interpretation is no acute cardiopulmonary disease.  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 The patient is being admitted for AMS from baseline and sepsis workup.  #New-onset AMS in setting of Dementia #Urinary Tract Infection Per ED provider, the patient has not been at baseline mental status for the past 24 hours.  She has indwelling Foley catheter with frequent UTIs.  At SNF the patient was febrile. The patient has had previous UTIs and endorses pain in the suprapubic region. Upon presentation to the ED, the patient was meeting SIRS criteria. On exam the patient is not oriented to person, place, situation, month.  Cardiac auscultation notable for tachycardia. CT head and CXR negative. Lactate mildly elevated at 2.0 with WBC 14.8. Troponin negative. Respiratory panel negative. In the setting of indwelling urinary catheter the source of her infection is likely urinary tract. UA with microscopy positive for bacteria and white blood cells. Urine and blood cultures are pending. ED provider gave 1 dose of cefepime . Blood culture ID from previous admission on 06/01/2023 showing E.coli susceptible to Rocephin . Will de-escalate antibiotics to rocephin  and reassess antibiotics pending cultures. The patient received 2 mg of Haldol  in the ED due to consistent attempts to remove Foley catheter despite mittens. -Discontinued cefepime  2 g IV (received one dose) -Start Rocephin  1g IV 200 mL/hr -Blood cultures pending -Urine cultures pending -Delirium precautions -Hold Haldol , will reassess pt's mental status  #Elevated Liver Enzymes Patient has slightly elevated AST and ALT of 52 and 67 respectively.  No known history of pre-existing liver disease.  Will continue to monitor while workup is pending. - RUQ ultrasound pending  #IDC, stage  IIIB -Continue Arimidex  1 mg daily  #History of CVA Per SNF records the patient has history of CVA with residual deficits.  Unable to determine if patient is having new focal deficits as mental status is altered from baseline and unclear if patient was fully following commands during exam. Will need to reassess after additional information from family. -Continue 81 mg aspirin  daily  #Constipation -Continue Dulcolax 10 mg as needed -Continue Senna -Continue MiraLAX   #GERD -Continue famotidine  20 mg daily  #Epilepsy Patient has history of seizures including during last admission in April 2025.  Neurology was consulted and determined likely source is from hypoglycemia.  No concern for seizure-like activity at this time. -Continue Depakote  250 mg twice daily  Best practice: Diet: NPO VTE: Lovenox  IVF: LR 125mL/hr Code: DNR/DNI  Disposition planning: Prior to Admission Living Arrangement: [facility] SNF Anticipated Discharge Location: [facility] SNF  Dispo: Admit patient to Observation  with expected length of stay less than 2 midnights.  Signed: Waymond Cart, MD 09/03/2023, 12:46 AM  Please contact IM Residency On-Call Pager at: [phone] or [phone].

## 2023-09-02 NOTE — ED Notes (Signed)
 CCMD called for cardiac monitoring.

## 2023-09-02 NOTE — H&P (Incomplete)
 Date: 09/02/2023               Patient Name:  Carolyn Parrish MRN: [id number]  DOB: July 29, 1940 Age / Sex: 83 y.o., female   PCP: Pcp, No         Medical Service: Internal Medicine Teaching Service         Attending Physician: Dr. Eben Reyes BROCKS, MD    First Contact: Dr. Armando Hora Pager: [phone]  Second Contact: Dr. Libby Blanch Pager: [phone]       After Hours (After 5p/  First Contact Pager: [phone]  weekends / holidays): Second Contact Pager: [phone]   Chief Complaint: AMS  HPI limited due to patient's underlying mental status changes.  Information obtained from ED provider.  Patient's family not at bedside.  History of Present Illness:   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  The patient is currently living at [facility] and rehabilitation center where she was noted to be febrile. The patient states that she has pain with urination.  Meds:  Current Meds  Medication Sig  . acetaminophen  (TYLENOL ) 500 MG tablet Take 500 mg by mouth every 4 (four) hours as needed.  . anastrozole  (ARIMIDEX ) 1 MG tablet Take 1 tablet (1 mg total) by mouth daily.  . aspirin  EC 81 MG tablet Take 1 tablet (81 mg total) by mouth daily.  . bisacodyl  (DULCOLAX) 10 MG suppository Place 1 suppository (10 mg total) rectally as needed for moderate constipation or severe constipation.  . Cholecalciferol (VITAMIN D3) 25 MCG (1000 UT) CAPS Take 1 capsule (1,000 Units total) by mouth daily.  . divalproex  (DEPAKOTE  SPRINKLE) 125 MG capsule Take 500 mg by mouth daily.  . famotidine  (PEPCID ) 20 MG tablet Take 20 mg by mouth daily.  . Multiple Vitamin (MULTIVITAMIN WITH MINERALS) TABS tablet Take 1 tablet by mouth daily.  . polyethylene glycol (MIRALAX  / GLYCOLAX ) packet Take 17 g  by mouth daily.  . sennosides-docusate sodium  (SENOKOT-S) 8.6-50 MG tablet Take 1 tablet by mouth at bedtime.     Allergies: Allergies as of 09/02/2023 - Review Complete 09/02/2023  Allergen Reaction Noted  . Atorvastatin  09/12/2017   Past Medical History:  Diagnosis Date  . Cancer (HCC)    BREAST CANCER  . CHF (congestive heart failure) (HCC)   . DM (diabetes mellitus), secondary, with peripheral vascular complications (HCC)   . Hyperlipidemia   . Hypertension   . Schizoaffective disorder (HCC)   . Stroke [facility])     Family History: Unable to obtain  Social History: Unable to obtain  Review of Systems: ROS limited due to patient's mental status.  Physical Exam: Blood pressure (!) 111/95, pulse 96, temperature 98.1 F (36.7 C), resp. rate 15, height 5' 6 (1.676 m), weight 65 kg, SpO2 100%.  Physical Exam Constitutional:      General: She is not in acute distress.    Appearance: Normal appearance. She is not toxic-appearing.  HENT:     Head: Normocephalic and atraumatic.  Cardiovascular:     Rate and Rhythm: Regular rhythm. Tachycardia present.     Pulses: Normal pulses.     Heart sounds: Normal heart sounds. No murmur heard.    No friction rub. No gallop.  Neurological:     Mental Status:  She is disoriented.     Comments: The patient is able to follow some commands.  She is not oriented to name, month, year, place.      EKG: personally reviewed my interpretation is sinus tachycardia.  CXR: personally reviewed my interpretation is***  Assessment & Plan by Problem: Principal Problem:   Urinary tract infection   Dispo: Admit patient to Inpatient with expected length of stay greater than 2 midnights.  Signed: Waymond Cart, MD 09/02/2023, 11:55 PM  Pager: @MYPAGER @ After 5pm on weekdays and 1pm on weekends: On Call pager: [phone]

## 2023-09-02 NOTE — ED Provider Notes (Addendum)
 [facility] EMERGENCY DEPARTMENT AT [facility] Provider Note   CSN: [id number] Arrival date & time: 09/02/23  1807    Patient presents with: Altered Mental Status   Carolyn Parrish is a 83 y.o. female here for AMS and possible UTI. Has chronic indwelling Foley catheter since being discharged to nursing facility.  Multiple medical comorbidities.  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  Patient not at her baseline over the last 24 hours.  Febrile at facility and with EMS.  She points to pain to her suprapubic region when asked where she has pain.  She is typically ANO x 4.   Waldo Glade at [phone]    HPI     Prior to Admission medications   Medication Sig Start Date End Date Taking? Authorizing Provider  acetaminophen  (TYLENOL ) 500 MG tablet Take 500 mg by mouth every 4 (four) hours as needed.    [provider]  alum & mag hydroxide-simeth (MAALOX/MYLANTA) 200-200-20 MG/5ML suspension Take 30 mLs by mouth every 4 (four) hours as needed for indigestion or heartburn.    [provider]  Amino Acids-Protein Hydrolys (FEEDING SUPPLEMENT, PRO-STAT SUGAR FREE 64,) LIQD Take 30 mLs by mouth 3 (three) times daily with meals.    [provider]  anastrozole  (ARIMIDEX ) 1 MG tablet Take 1 tablet (1 mg total) by mouth daily. 11/23/17   Magrinat, Sandria BROCKS, MD  aspirin  EC 81 MG tablet Take 1 tablet (81 mg total) by mouth daily. 06/12/23   Pokhrel, Vernal, MD  bisacodyl  (DULCOLAX) 10 MG suppository Place 1 suppository (10 mg total) rectally as needed for moderate constipation or severe constipation. 06/12/23   Pokhrel, Laxman, MD  Cholecalciferol (VITAMIN D3) 25 MCG (1000 UT) CAPS Take 1 capsule (1,000 Units total) by mouth daily. 06/12/23   Pokhrel, Vernal, MD  DIVALPROEX  SODIUM ER PO Take 125 mg by mouth daily. Give 4 capsules by mouth daily (500 mg) Patient not taking: Reported on 05/30/2023 03/05/18   [provider]  famotidine   (PEPCID ) 20 MG tablet Take 20 mg by mouth daily. 04/18/23   [provider]  Multiple Vitamin (MULTIVITAMIN WITH MINERALS) TABS tablet Take 1 tablet by mouth daily. 06/13/23   Pokhrel, Vernal, MD  Nutritional Supplements (ENSURE ENLIVE PO) Take 237 mLs by mouth 4 (four) times daily.    [provider]  polyethylene glycol (MIRALAX  / GLYCOLAX ) packet Take 17 g by mouth daily.    [provider]  sennosides-docusate sodium  (SENOKOT-S) 8.6-50 MG tablet Take 1 tablet by mouth at bedtime.    [provider]  traZODone  (DESYREL ) 50 MG tablet Take 0.5 tablets (25 mg total) by mouth at bedtime as needed for sleep. 06/12/23   Pokhrel, Laxman, MD    Allergies: Atorvastatin    Review of Systems  Unable to perform ROS: Mental status change  Constitutional:  Positive for fever.  Genitourinary:  Positive for pelvic pain.  Psychiatric/Behavioral:  Positive for agitation.   All other systems reviewed and are negative.   Updated Vital Signs BP 112/69   Pulse (!) 103   Temp 100.2 F (37.9 C) (Rectal)   Resp (!) 27   Ht 5' 6 (1.676 m)   Wt 65 kg   SpO2 99%   BMI 23.13 kg/m   Physical Exam Vitals and nursing note reviewed.  Constitutional:      General: She is in acute distress.     Appearance: She is well-developed. She is ill-appearing.  HENT:  Head: Atraumatic.     Nose: Nose normal.     Mouth/Throat:     Mouth: Mucous membranes are moist.  Eyes:     Pupils: Pupils are equal, round, and reactive to light.  Cardiovascular:     Rate and Rhythm: Tachycardia present.     Pulses: Normal pulses.     Heart sounds: Normal heart sounds.  Pulmonary:     Effort: Pulmonary effort is normal. No respiratory distress.     Breath sounds: Normal breath sounds. No wheezing or rales.  Abdominal:     General: There is no distension.     Tenderness: There is abdominal tenderness in the suprapubic area. There is no right CVA tenderness, left CVA tenderness, guarding or  rebound.     Comments: Purple urine bag  Musculoskeletal:        General: Normal range of motion.     Cervical back: Normal range of motion.  Skin:    General: Skin is warm and dry.     Capillary Refill: Capillary refill takes less than 2 seconds.  Neurological:     General: No focal deficit present.     Mental Status: She is alert.  Psychiatric:        Mood and Affect: Mood normal.     (all labs ordered are listed, but only abnormal results are displayed) Labs Reviewed  CBC WITH DIFFERENTIAL/PLATELET - Abnormal; Notable for the following components:      Result Value   WBC 14.8 (*)    Hemoglobin 11.2 (*)    RDW 22.4 (*)    Platelets 448 (*)    Neutro Abs 12.3 (*)    All other components within normal limits  URINALYSIS, W/ REFLEX TO CULTURE (INFECTION SUSPECTED) - Abnormal; Notable for the following components:   Color, Urine AMBER (*)    APPearance CLOUDY (*)    Hgb urine dipstick SMALL (*)    Ketones, ur 5 (*)    Protein, ur 100 (*)    Nitrite POSITIVE (*)    Leukocytes,Ua MODERATE (*)    Bacteria, UA MANY (*)    All other components within normal limits  COMPREHENSIVE METABOLIC PANEL WITH GFR - Abnormal; Notable for the following components:   CO2 21 (*)    Glucose, Bld 100 (*)    Calcium  8.3 (*)    Total Protein 6.4 (*)    Albumin  2.4 (*)    AST 52 (*)    ALT 67 (*)    Alkaline Phosphatase 274 (*)    All other components within normal limits  I-STAT CG4 LACTIC ACID, ED - Abnormal; Notable for the following components:   Lactic Acid, Venous 2.5 (*)    All other components within normal limits  RESP PANEL BY RT-PCR (RSV, FLU A&B, COVID)  RVPGX2  CULTURE, BLOOD (ROUTINE X 2)  CULTURE, BLOOD (ROUTINE X 2)  URINE CULTURE  PROTIME-INR  I-STAT CG4 LACTIC ACID, ED  TROPONIN I (HIGH SENSITIVITY)  TROPONIN I (HIGH SENSITIVITY)    EKG: EKG Interpretation Date/Time:  Sunday September 02 2023 18:15:23 EDT Ventricular Rate:  111 PR Interval:  138 QRS  Duration:  79 QT Interval:  361 QTC Calculation: 491 R Axis:   11  Text Interpretation: Sinus tachycardia Abnormal R-wave progression, early transition Abnormal T, consider ischemia, diffuse leads new t wave changes high lat Confirmed by Dean Clarity [phone]) on 09/02/2023 6:33:02 PM  Radiology: CT Head Wo Contrast Result Date: 09/02/2023 CLINICAL DATA:  Altered mental status  EXAM: CT HEAD WITHOUT CONTRAST TECHNIQUE: Contiguous axial images were obtained from the base of the skull through the vertex without intravenous contrast. RADIATION DOSE REDUCTION: This exam was performed according to the departmental dose-optimization program which includes automated exposure control, adjustment of the mA and/or kV according to patient size and/or use of iterative reconstruction technique. COMPARISON:  05/30/2023 FINDINGS: Brain: Diffuse cerebral and cerebellar atrophy. No acute intracranial abnormality. Specifically, no hemorrhage, hydrocephalus, mass lesion, acute infarction, or significant intracranial injury. Vascular: No hyperdense vessel or unexpected calcification. Skull: No acute calvarial abnormality. Sinuses/Orbits: No acute findings Other: None IMPRESSION: No acute intracranial abnormality. Electronically Signed   By: Franky Crease M.D.   On: 09/02/2023 19:52   DG Chest Port 1 View Result Date: 09/02/2023 CLINICAL DATA:  Possible sepsis EXAM: PORTABLE CHEST 1 VIEW COMPARISON:  05/31/2023 FINDINGS: Cardiac shadow is within normal limits. Lungs are well aerated bilaterally. No focal infiltrate or effusion is seen. No bony abnormality is noted. IMPRESSION: No active disease. Electronically Signed   By: Oneil Devonshire M.D.   On: 09/02/2023 19:31     .Critical Care  Performed by: Edie Rosebud LABOR, PA-C Authorized by: Edie Rosebud LABOR, PA-C   Critical care provider statement:    Critical care time (minutes):  35   Critical care was necessary to treat or prevent imminent or life-threatening  deterioration of the following conditions:  Sepsis   Critical care was time spent personally by me on the following activities:  Development of treatment plan with patient or surrogate, discussions with consultants, evaluation of patient's response to treatment, examination of patient, ordering and review of laboratory studies, ordering and review of radiographic studies, ordering and performing treatments and interventions, pulse oximetry, re-evaluation of patient's condition and review of old charts    Medications Ordered in the ED  lactated ringers  infusion (has no administration in time range)  sodium chloride  0.9 % bolus 2,000 mL (0 mLs Intravenous Stopped 09/02/23 2110)  ceFEPIme  (MAXIPIME ) 2 g in sodium chloride  0.9 % 100 mL IVPB (0 g Intravenous Stopped 09/02/23 1907)  acetaminophen  (TYLENOL ) suppository 650 mg (650 mg Rectal Given 09/02/23 1832)   83 yo here for AMS and possible UTI. Febrile, tachycardic, tachypneic, code sepsis called.  Suspect urinary source.  Reviewed her prior urine and blood cultures.  Sensitive to cefepime .  Will give cefepime , IV fluids labs and reassess.  Labs and imaging personally viewed interpreted CBC leukocytosis 14.8 Lactic acid 2.5 INR 1.0 Metabolic panel elevated LFTs-history elevated alk phos many years ago however most recent alk phos within normal limits Troponin 10 COVID, flu, RSV CT head without significant abnormality Chest x-ray without significant abnormality  Patient Foley catheter replaced.  Urine grossly positive for UTI.  Given elevated LFTs will get CT abdomen pelvis.  Will need admission for sepsis, altered mental status likely due to UTI.  CONSULT with IM teaching who is agreeable to eval patient for admission  Attempted to contact patient's daughter Krystyne Tewksbury at [phone] again to update on admission however not answering phone.  The patient appears reasonably stabilized for admission considering the current resources,  flow, and capabilities available in the ED at this time, and I doubt any other [facility] requiring further screening and/or treatment in the ED prior to admission.   ADDEND: Patient trying to rip out her Foley catheter despite mittens.  2 IM Haldol  to be given.  Medical Decision Making Amount and/or Complexity of Data Reviewed Independent Historian: EMS External Data Reviewed: labs, radiology, ECG and notes. Labs: ordered. Decision-making details documented in ED Course. Radiology: ordered and independent interpretation performed. Decision-making details documented in ED Course. ECG/medicine tests: ordered and independent interpretation performed. Decision-making details documented in ED Course.  Risk OTC drugs. Prescription drug management. Parenteral controlled substances. Decision regarding hospitalization. Diagnosis or treatment significantly limited by social determinants of health.       Final diagnoses:  Sepsis without acute organ dysfunction, due to unspecified organism [facility])  Lower urinary tract infectious disease  Elevated LFTs  Altered mental status, unspecified altered mental status type    ED Discharge Orders     None          Judea Fennimore A, PA-C 09/02/23 2124    Raunel Dimartino A, PA-C 09/02/23 2130    Dean Clarity, MD 09/02/23 2340

## 2023-09-02 NOTE — ED Notes (Signed)
 Hospitalist at bedside

## 2023-09-03 ENCOUNTER — Encounter (HOSPITAL_COMMUNITY): Payer: Self-pay | Admitting: Infectious Diseases

## 2023-09-03 ENCOUNTER — Observation Stay (HOSPITAL_COMMUNITY)

## 2023-09-03 DIAGNOSIS — Z87440 Personal history of urinary (tract) infections: Secondary | ICD-10-CM | POA: Diagnosis not present

## 2023-09-03 DIAGNOSIS — R7989 Other specified abnormal findings of blood chemistry: Secondary | ICD-10-CM

## 2023-09-03 DIAGNOSIS — Z79899 Other long term (current) drug therapy: Secondary | ICD-10-CM | POA: Diagnosis not present

## 2023-09-03 DIAGNOSIS — N390 Urinary tract infection, site not specified: Secondary | ICD-10-CM | POA: Diagnosis present

## 2023-09-03 DIAGNOSIS — Z923 Personal history of irradiation: Secondary | ICD-10-CM | POA: Diagnosis not present

## 2023-09-03 DIAGNOSIS — E11649 Type 2 diabetes mellitus with hypoglycemia without coma: Secondary | ICD-10-CM | POA: Diagnosis present

## 2023-09-03 DIAGNOSIS — Z888 Allergy status to other drugs, medicaments and biological substances status: Secondary | ICD-10-CM | POA: Diagnosis not present

## 2023-09-03 DIAGNOSIS — Z66 Do not resuscitate: Secondary | ICD-10-CM | POA: Diagnosis present

## 2023-09-03 DIAGNOSIS — Z853 Personal history of malignant neoplasm of breast: Secondary | ICD-10-CM | POA: Diagnosis not present

## 2023-09-03 DIAGNOSIS — Y731 Therapeutic (nonsurgical) and rehabilitative gastroenterology and urology devices associated with adverse incidents: Secondary | ICD-10-CM | POA: Diagnosis present

## 2023-09-03 DIAGNOSIS — Z515 Encounter for palliative care: Secondary | ICD-10-CM | POA: Diagnosis not present

## 2023-09-03 DIAGNOSIS — K219 Gastro-esophageal reflux disease without esophagitis: Secondary | ICD-10-CM | POA: Diagnosis present

## 2023-09-03 DIAGNOSIS — K5641 Fecal impaction: Secondary | ICD-10-CM | POA: Diagnosis not present

## 2023-09-03 DIAGNOSIS — R933 Abnormal findings on diagnostic imaging of other parts of digestive tract: Secondary | ICD-10-CM | POA: Diagnosis not present

## 2023-09-03 DIAGNOSIS — I10 Essential (primary) hypertension: Secondary | ICD-10-CM | POA: Diagnosis present

## 2023-09-03 DIAGNOSIS — Z79811 Long term (current) use of aromatase inhibitors: Secondary | ICD-10-CM | POA: Diagnosis not present

## 2023-09-03 DIAGNOSIS — Z9049 Acquired absence of other specified parts of digestive tract: Secondary | ICD-10-CM | POA: Diagnosis not present

## 2023-09-03 DIAGNOSIS — R6520 Severe sepsis without septic shock: Secondary | ICD-10-CM | POA: Diagnosis present

## 2023-09-03 DIAGNOSIS — E876 Hypokalemia: Secondary | ICD-10-CM | POA: Diagnosis present

## 2023-09-03 DIAGNOSIS — B964 Proteus (mirabilis) (morganii) as the cause of diseases classified elsewhere: Secondary | ICD-10-CM | POA: Diagnosis present

## 2023-09-03 DIAGNOSIS — R4182 Altered mental status, unspecified: Secondary | ICD-10-CM | POA: Diagnosis not present

## 2023-09-03 DIAGNOSIS — E785 Hyperlipidemia, unspecified: Secondary | ICD-10-CM | POA: Diagnosis present

## 2023-09-03 DIAGNOSIS — K5900 Constipation, unspecified: Secondary | ICD-10-CM | POA: Diagnosis present

## 2023-09-03 DIAGNOSIS — T83511A Infection and inflammatory reaction due to indwelling urethral catheter, initial encounter: Secondary | ICD-10-CM | POA: Diagnosis present

## 2023-09-03 DIAGNOSIS — A419 Sepsis, unspecified organism: Secondary | ICD-10-CM | POA: Diagnosis not present

## 2023-09-03 DIAGNOSIS — Z7189 Other specified counseling: Secondary | ICD-10-CM | POA: Diagnosis not present

## 2023-09-03 DIAGNOSIS — K639 Disease of intestine, unspecified: Secondary | ICD-10-CM | POA: Diagnosis not present

## 2023-09-03 DIAGNOSIS — Z7982 Long term (current) use of aspirin: Secondary | ICD-10-CM | POA: Diagnosis not present

## 2023-09-03 DIAGNOSIS — Z9012 Acquired absence of left breast and nipple: Secondary | ICD-10-CM | POA: Diagnosis not present

## 2023-09-03 DIAGNOSIS — T85520A Displacement of bile duct prosthesis, initial encounter: Secondary | ICD-10-CM | POA: Diagnosis not present

## 2023-09-03 DIAGNOSIS — F0390 Unspecified dementia without behavioral disturbance: Secondary | ICD-10-CM | POA: Diagnosis present

## 2023-09-03 DIAGNOSIS — G40909 Epilepsy, unspecified, not intractable, without status epilepticus: Secondary | ICD-10-CM | POA: Diagnosis present

## 2023-09-03 DIAGNOSIS — A4159 Other Gram-negative sepsis: Secondary | ICD-10-CM | POA: Diagnosis present

## 2023-09-03 DIAGNOSIS — G9341 Metabolic encephalopathy: Secondary | ICD-10-CM | POA: Diagnosis present

## 2023-09-03 LAB — HEPATIC FUNCTION PANEL
ALT: 62 U/L — ABNORMAL HIGH (ref 0–44)
AST: 51 U/L — ABNORMAL HIGH (ref 15–41)
Albumin: 2.4 g/dL — ABNORMAL LOW (ref 3.5–5.0)
Alkaline Phosphatase: 251 U/L — ABNORMAL HIGH (ref 38–126)
Bilirubin, Direct: 0.3 mg/dL — ABNORMAL HIGH (ref 0.0–0.2)
Indirect Bilirubin: 0.8 mg/dL (ref 0.3–0.9)
Total Bilirubin: 1.1 mg/dL (ref 0.0–1.2)
Total Protein: 6.9 g/dL (ref 6.5–8.1)

## 2023-09-03 LAB — GLUCOSE, CAPILLARY
Glucose-Capillary: 76 mg/dL (ref 70–99)
Glucose-Capillary: 190 mg/dL — ABNORMAL HIGH (ref 70–99)
Glucose-Capillary: 114 mg/dL — ABNORMAL HIGH (ref 70–99)

## 2023-09-03 LAB — CBC
HCT: 33.5 % — ABNORMAL LOW (ref 36.0–46.0)
Hemoglobin: 10.3 g/dL — ABNORMAL LOW (ref 12.0–15.0)
MCH: 27.1 pg (ref 26.0–34.0)
MCHC: 30.7 g/dL (ref 30.0–36.0)
MCV: 88.2 fL (ref 80.0–100.0)
Platelets: 316 K/uL (ref 150–400)
RBC: 3.8 MIL/uL — ABNORMAL LOW (ref 3.87–5.11)
RDW: 21.1 % — ABNORMAL HIGH (ref 11.5–15.5)
WBC: 13 K/uL — ABNORMAL HIGH (ref 4.0–10.5)
nRBC: 0 % (ref 0.0–0.2)

## 2023-09-03 LAB — BASIC METABOLIC PANEL WITH GFR
Anion gap: 13 (ref 5–15)
BUN: 13 mg/dL (ref 8–23)
CO2: 21 mmol/L — ABNORMAL LOW (ref 22–32)
Calcium: 8.9 mg/dL (ref 8.9–10.3)
Chloride: 106 mmol/L (ref 98–111)
Creatinine, Ser: 0.59 mg/dL (ref 0.44–1.00)
GFR, Estimated: >60 mL/min (ref >60)
Glucose, Bld: 95 mg/dL (ref 70–99)
Potassium: 3.7 mmol/L (ref 3.5–5.1)
Sodium: 140 mmol/L (ref 135–145)

## 2023-09-03 MED ORDER — DEXTROSE 50 % IV SOLN
1.0000 | Freq: Once | INTRAVENOUS | Status: AC
  Administered 2023-09-03: 50 mL via INTRAVENOUS

## 2023-09-03 MED ORDER — DEXTROSE 50 % IV SOLN
INTRAVENOUS | Status: AC
  Filled 2023-09-03: qty 50

## 2023-09-03 MED ORDER — CHLORHEXIDINE GLUCONATE CLOTH 2 % EX PADS
6.0000 | MEDICATED_PAD | Freq: Every day | CUTANEOUS | Status: DC
  Administered 2023-09-03 – 2023-09-09 (×7): 6 via TOPICAL

## 2023-09-03 NOTE — Progress Notes (Addendum)
 HD#0 SUBJECTIVE:  Patient Summary: Carolyn Parrish is a 83 y.o. 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  Overnight Events:  N/A Interim History: No overnight issues, patient remained calm and cooperative, she still has mittens but pull her foley a bit.  OBJECTIVE:  Vital Signs: Vitals:   09/02/23 2233 09/02/23 2336 09/03/23 0345 09/03/23 0749  BP:  (!) 111/95 (!) 154/135 (!) 157/76  Pulse:  96 (!) 101 99  Resp:  15 17 16   Temp: 98.7 F (37.1 C) 98.1 F (36.7 C) 98.7 F (37.1 C) (!) 97.1 F (36.2 C)  TempSrc: Oral  Axillary   SpO2:  100% 100% 100%  Weight:      Height:       Supplemental O2: Room Air SpO2: 100 %  Filed Weights   09/02/23 1815  Weight: 65 kg     Intake/Output Summary (Last 24 hours) at 09/03/2023 1400 Last data filed at 09/03/2023 1200 Gross per 24 hour  Intake 955.82 ml  Output 475 ml  Net 480.82 ml   Net IO Since Admission: 480.82 mL [09/03/23 1400]  Physical Exam: Physical Exam  Cardiac: Regular rate and rhythm, normal S1 and S2, no murmurs, rubs, or gallops. Lungs: Clear to auscultation bilaterally in all fields, with no wheezes or crackles appreciated. Normal respiratory effort. Abdomen: Non-distended, soft, non-tender, with no masses or organomegaly appreciated. Normal active bowel sounds. Extremities: Warm and well-perfused, with no cyanosis, clubbing, or edema. 2+ pulses in all four distal extremities. Neurological: Alert and oriented 4.   Patient Lines/Drains/Airways Status     Active Line/Drains/Airways     Name Placement date Placement time Site Days   Peripheral IV 09/02/23 20 G Right Hand 09/02/23  1824  Hand  1   Peripheral IV 09/02/23 18 G Left Forearm 09/02/23  1825  Forearm  1   Urethral Catheter J kantlehner RN Latex 14 Fr. 09/02/23  1903  Latex  1             Pertinent labs and imaging:      Latest Ref Rng & Units 09/03/2023    4:13 AM 09/02/2023    6:23 PM 06/10/2023    3:00 AM  CBC  WBC 4.0 - 10.5 K/uL 13.0  14.8  10.6   Hemoglobin 12.0 - 15.0 g/dL 89.6  88.7  8.7   Hematocrit 36.0 - 46.0 % 33.5  36.4  28.5   Platelets 150 - 400 K/uL 316  448  437        Latest Ref Rng & Units 09/03/2023    4:13 AM 09/03/2023    4:12 AM 09/02/2023    7:20 PM  CMP  Glucose 70 - 99 mg/dL 95   899   BUN 8 - 23 mg/dL 13   17   Creatinine 9.55 - 1.00 mg/dL 9.40   9.38   Sodium 864 - 145 mmol/L 140   141   Potassium 3.5 - 5.1 mmol/L 3.7   4.7   Chloride 98 - 111 mmol/L 106   110   CO2 22 - 32 mmol/L 21   21   Calcium  8.9 - 10.3 mg/dL 8.9   8.3   Total Protein 6.5 - 8.1 g/dL  6.9  6.4   Total Bilirubin 0.0 - 1.2 mg/dL  1.1  1.0  Alkaline Phos 38 - 126 U/L  251  274   AST 15 - 41 U/L  51  52   ALT 0 - 44 U/L  62  67     US  Abdomen Limited RUQ (LIVER/GB) Result Date: 09/03/2023 EXAM: Right Upper Quadrant Abdominal Ultrasound 09/03/2023 04:32:05 AM TECHNIQUE: Real-time ultrasonography of the right upper quadrant of the abdomen was performed. COMPARISON: CT of the abdomen and pelvis 01/08/2018. CLINICAL HISTORY: Elevated liver enzymes. FINDINGS: LIVER: The liver is hyperechoic and somewhat heterogeneous. No discrete lesions are present. BILIARY SYSTEM: Cholecystectomy. Common bile duct is dilated to 14 mm. RIGHT KIDNEY: The right kidney is grossly unremarkable in appearances without evidence of hydronephrosis, echogenic calculi or worrisome mass lesions. PANCREAS: Visualized portions of the pancreas are unremarkable. OTHER: No right upper quadrant ascites. IMPRESSION: 1. Hepatic steatosis with somewhat heterogeneous echotexture. No discrete lesions. 2. Dilated common bile duct measuring 14 mm. 3. Status post cholecystectomy. Electronically signed by: Lonni Necessary MD 09/03/2023 04:48 AM EDT RP Workstation: [id number]   CT Head Wo Contrast Result Date:  09/02/2023 CLINICAL DATA:  Altered mental status EXAM: CT HEAD WITHOUT CONTRAST TECHNIQUE: Contiguous axial images were obtained from the base of the skull through the vertex without intravenous contrast. RADIATION DOSE REDUCTION: This exam was performed according to the departmental dose-optimization program which includes automated exposure control, adjustment of the mA and/or kV according to patient size and/or use of iterative reconstruction technique. COMPARISON:  05/30/2023 FINDINGS: Brain: Diffuse cerebral and cerebellar atrophy. No acute intracranial abnormality. Specifically, no hemorrhage, hydrocephalus, mass lesion, acute infarction, or significant intracranial injury. Vascular: No hyperdense vessel or unexpected calcification. Skull: No acute calvarial abnormality. Sinuses/Orbits: No acute findings Other: None IMPRESSION: No acute intracranial abnormality. Electronically Signed   By: Franky Crease M.D.   On: 09/02/2023 19:52   DG Chest Port 1 View Result Date: 09/02/2023 CLINICAL DATA:  Possible sepsis EXAM: PORTABLE CHEST 1 VIEW COMPARISON:  05/31/2023 FINDINGS: Cardiac shadow is within normal limits. Lungs are well aerated bilaterally. No focal infiltrate or effusion is seen. No bony abnormality is noted. IMPRESSION: No active disease. Electronically Signed   By: Oneil Devonshire M.D.   On: 09/02/2023 19:31    ASSESSMENT/PLAN:  Assessment: Principal Problem:   Lower urinary tract infectious disease Active Problems:   Altered mental status   Elevated LFTs   Urinary tract infection   Plan:  Altered mental status:   -Baseline mental status is unclear. Attempted to contact Jame; her nurse has been out for two weeks with no available documentation. -Daughter reports the patient was at her baseline (normal) on Saturday, but experienced deterioration on Sunday. Due to lack of provider availability at [facility], she was brought to the ED. Daughter now reports significant  improvement. -Concerns for polypharmacy. -Today, patient is alert and oriented to time and place, calm, and cooperative. No overnight issues reportd. -Being treated for suspected UTI with Ceftriaxone  (Rocephin ) 1g IV daily. -Awaiting urine and blood culture results. WBC: 13 and HB: 10.3   #Urinary Tract Infection Patient presented with concern for UTI. Urinalysis showed numerous bacteria, RBCs, and WBCs. Patient denies dysuria or suprapubic pain. Urine culture is pending. Initiated on Ceftriaxone  1g IV daily in 200 mL  #Elevated Liver Enzymes Patient has slightly elevated AST and ALT of 52 and 67 respectively.  No known history of pre-existing liver disease.  Will continue to monitor while workup is pending. There is no concern for her now. - RUQ ultrasound pending     #IDC,  stage IIIB -Continue Arimidex  1 mg daily   #History of CVA - No focal deficit and sign of new stroke -Continue 81 mg aspirin  daily   #Constipation -Continue Dulcolax 10 mg as needed -Continue Senna -Continue MiraLAX    #GERD -Continue famotidine  20 mg daily   #Epilepsy Patient has history of seizures including during last admission in April 2025.  Neurology was consulted and determined likely source is from hypoglycemia.  No concern for seizure-like activity at this time. -Continue Depakote  250 mg twice daily    Best Practice: Diet: Liquid and then advanced to regular IVF: none VTE: enoxaparin  (LOVENOX ) injection 40 mg Start: 09/03/23 1000 Code: DNR  Disposition planning: DISPO: Anticipated discharge in 3 days to Skilled nursing facility pending clinical improvement  Signature:  Rmoni Keplinger [facility]  2:00 PM, 09/03/2023  On Call pager [phone]

## 2023-09-03 NOTE — TOC Initial Note (Signed)
 Transition of Care [facility]) - Initial/Assessment Note    Patient Details  Name: Carolyn Parrish MRN: [id number] Date of Birth: Jul 13, 1940  [facility] CM/SW Contact:    Gerrick Ray A [place], LCSW Phone Number: 09/03/2023, 12:03 PM  Clinical Narrative:                  CSW met with pt at bedside. She is only oriented to self. CSW reached out to complete assessment with pt's daughter, Rosco. She stated pt is from [facility] for LTC.   CSW reached out to [facility] confirmed pt is long term resident, can return whenever pt is medically stable for DC. Pt currently in mittens, needs 48 hours without sitter or non-violent restraints.   Inpatient management continue to follow.   Expected Discharge Plan: Skilled Nursing Facility Barriers to Discharge: Continued Medical Work up   Patient Goals and CMS Choice            Expected Discharge Plan and Services       Living arrangements for the past 2 months: Skilled Nursing Facility                                      Prior Living Arrangements/Services Living arrangements for the past 2 months: Skilled Nursing Facility Lives with:: Facility Resident          Need for Family Participation in Patient Care: Yes (Comment) Care giver support system in place?: Yes (comment) (pt's daughter, Rosco)      Activities of Daily Living   ADL Screening (condition at time of admission) Independently performs ADLs?: No Does the patient have a NEW difficulty with bathing/dressing/toileting/self-feeding that is expected to last >3 days?: Yes (Initiates electronic notice to provider for possible OT consult) Does the patient have a NEW difficulty with getting in/out of bed, walking, or climbing stairs that is expected to last >3 days?: Yes (Initiates electronic notice to provider for possible PT consult) Does the patient have a NEW difficulty with communication that is expected to last >3 days?: Yes (Initiates electronic  notice to provider for possible SLP consult) Is the patient deaf or have difficulty hearing?: Yes Does the patient have difficulty seeing, even when wearing glasses/contacts?: Yes Does the patient have difficulty concentrating, remembering, or making decisions?: Yes  Permission Sought/Granted                  Emotional Assessment   Attitude/Demeanor/Rapport: Irrational Affect (typically observed): Agitated Orientation: : Oriented to Self Alcohol / Substance Use: Not Applicable Psych Involvement: No (comment)  Admission diagnosis:  Lower urinary tract infectious disease [N39.0] Urinary tract infection [N39.0] Elevated LFTs [R79.89] Altered mental status, unspecified altered mental status type [R41.82] Sepsis without acute organ dysfunction, due to unspecified organism [facility]) [A41.9] Patient Active Problem List   Diagnosis Date Noted   Urinary tract infection 09/02/2023   Protein-calorie malnutrition, severe 05/31/2023   Seizure (HCC) 05/30/2023   Adult failure to thrive 09/07/2020   Dyslipidemia 01/23/2019   Normochromic normocytic anemia 01/23/2019   Vitamin D  deficiency 01/23/2019   Hypertensive heart disease with heart failure (HCC) 10/17/2018   Essential hypertension 08/01/2018   Vascular dementia (HCC) 01/22/2018   Acute hypernatremia 01/09/2018   Acute encephalopathy 01/08/2018   Dehydration    Sepsis (HCC) 11/14/2017   Septic shock (HCC) 11/14/2017   AKI (acute kidney injury) (HCC)    Lactic acidosis  Bipolar disorder (HCC)    History of completed stroke    Breast cancer, stage 2, left (HCC) 10/30/2017   Encounter for central line placement 09/12/2017   History of stroke 09/12/2017   DM (diabetes mellitus), type 2 with peripheral vascular complications (HCC) 09/12/2017   Schizoaffective disorder (HCC) 09/12/2017   Malignant neoplasm of upper-inner quadrant of left breast in female, estrogen receptor positive (HCC) 09/07/2017   PCP:  Freddrick, No Pharmacy:    [facility] - [place], [place] - [address] [place] [address] Phone: [phone] Fax: [phone]     Social Drivers of Health (SDOH) Social History: SDOH Screenings   Food Insecurity: Patient Unable To Answer (09/03/2023)  Housing: Patient Unable To Answer (09/03/2023)  Transportation Needs: Patient Unable To Answer (09/03/2023)  Utilities: Patient Unable To Answer (09/03/2023)  Depression (PHQ2-9): Medium Risk (12/03/2018)  Financial Resource Strain: Low Risk  (01/15/2018)  Physical Activity: Inactive (01/15/2018)  Social Connections: Patient Unable To Answer (09/03/2023)  Stress: Stress Concern Present (01/15/2018)  Tobacco Use: Low Risk  (05/30/2023)   SDOH Interventions:     Readmission Risk Interventions    06/12/2023    1:16 PM  Readmission Risk Prevention Plan  Transportation Screening Complete  Palliative Care Screening Not Applicable

## 2023-09-03 NOTE — Plan of Care (Signed)
  Problem: Health Behavior/Discharge Planning: Goal: Ability to manage health-related needs will improve Outcome: Not Progressing   Problem: Clinical Measurements: Goal: Ability to maintain clinical measurements within normal limits will improve Outcome: Not Progressing Goal: Will remain free from infection Outcome: Not Progressing Goal: Diagnostic test results will improve Outcome: Not Progressing Goal: Respiratory complications will improve Outcome: Not Progressing Goal: Cardiovascular complication will be avoided Outcome: Not Progressing   Problem: Activity: Goal: Risk for activity intolerance will decrease Outcome: Not Progressing   Problem: Nutrition: Goal: Adequate nutrition will be maintained Outcome: Not Progressing   Problem: Coping: Goal: Level of anxiety will decrease Outcome: Not Progressing   Problem: Elimination: Goal: Will not experience complications related to bowel motility Outcome: Not Progressing Goal: Will not experience complications related to urinary retention Outcome: Not Progressing   Problem: Pain Managment: Goal: General experience of comfort will improve and/or be controlled Outcome: Not Progressing   Problem: Safety: Goal: Ability to remain free from injury will improve Outcome: Not Progressing   Problem: Skin Integrity: Goal: Risk for impaired skin integrity will decrease Outcome: Not Progressing

## 2023-09-03 NOTE — Progress Notes (Signed)
 HD#0 SUBJECTIVE:  Patient Summary: Carolyn Parrish is a 83 y.o. 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    Overnight Events: Febrile 101 and one dose of Tylenol   Interim History: Assess orientation, she remembered seeing daughter, denies dysuria, and suprapubic pain. Patient reports feeling better.   OBJECTIVE:  Vital Signs: Vitals:   09/02/23 2336 09/03/23 0345 09/03/23 0749 09/03/23 1731  BP: (!) 111/95 (!) 154/135 (!) 157/76 (!) 172/75  Pulse: 96 (!) 101 99 (!) 116  Resp: 15 17 16 16   Temp: 98.1 F (36.7 C) 98.7 F (37.1 C) (!) 97.1 F (36.2 C) 97.9 F (36.6 C)  TempSrc:  Axillary    SpO2: 100% 100% 100% 100%  Weight:      Height:        Vs: bp 138/66 and PR:101, temp: 98.7 Wbc: 19.6  from 13 and neut 12.4, K: 3.4 UC positive for ecoli Bc: no growth till now I/O: -292 since admit Supplemental O2: Nasal Cannula SpO2: 100 %  Filed Weights   09/02/23 1815  Weight: 65 kg     Intake/Output Summary (Last 24 hours) at 09/03/2023 1853 Last data filed at 09/03/2023 1200 Gross per 24 hour  Intake 855.82 ml  Output 925 ml  Net -69.18 ml   Net IO Since Admission: 30.82 mL [09/03/23 1853]  Physical Exam: Physical Exam Cardiac: Regular rate and rhythm, normal S1 and S2, no murmurs, rubs, or gallops. Lungs: Clear to auscultation bilaterally in all fields, with no wheezes or crackles appreciated. Normal respiratory effort. Abdomen: Non-distended,  with no masses, abdominal palpation elicits pain. Unable to sit patient up Extremities: Warm and well-perfused, with no cyanosis, clubbing, or edema. 2+ pulses in all four distal extremities. Neurological: Alert and oriented 4.  Back: No CVA tenderness. Extremities: Warm and well-perfused, with no cyanosis, clubbing, or edema. 2+ pulses in all four  distal extremities.   Patient Lines/Drains/Airways Status     Active Line/Drains/Airways     Name Placement date Placement time Site Days   Peripheral IV 09/02/23 20 G Right Hand 09/02/23  1824  Hand  1   Peripheral IV 09/02/23 18 G Left Forearm 09/02/23  1825  Forearm  1   Urethral Catheter J kantlehner RN Latex 14 Fr. 09/02/23  1903  Latex  1            Pertinent labs and imaging:      Latest Ref Rng & Units 09/03/2023    4:13 AM 09/02/2023    6:23 PM 06/10/2023    3:00 AM  CBC  WBC 4.0 - 10.5 K/uL 13.0  14.8  10.6   Hemoglobin 12.0 - 15.0 g/dL 89.6  88.7  8.7   Hematocrit 36.0 - 46.0 % 33.5  36.4  28.5   Platelets 150 - 400 K/uL 316  448  437        Latest Ref Rng & Units 09/03/2023    4:13 AM 09/03/2023    4:12 AM 09/02/2023    7:20 PM  CMP  Glucose 70 - 99 mg/dL 95   899   BUN 8 - 23 mg/dL 13   17   Creatinine 9.55 - 1.00 mg/dL 9.40   9.38   Sodium 864 - 145 mmol/L 140   141   Potassium 3.5 - 5.1 mmol/L 3.7  4.7   Chloride 98 - 111 mmol/L 106   110   CO2 22 - 32 mmol/L 21   21   Calcium  8.9 - 10.3 mg/dL 8.9   8.3   Total Protein 6.5 - 8.1 g/dL  6.9  6.4   Total Bilirubin 0.0 - 1.2 mg/dL  1.1  1.0   Alkaline Phos 38 - 126 U/L  251  274   AST 15 - 41 U/L  51  52   ALT 0 - 44 U/L  62  67     US  Abdomen Limited RUQ (LIVER/GB) Result Date: 09/03/2023 EXAM: Right Upper Quadrant Abdominal Ultrasound 09/03/2023 04:32:05 AM TECHNIQUE: Real-time ultrasonography of the right upper quadrant of the abdomen was performed. COMPARISON: CT of the abdomen and pelvis 01/08/2018. CLINICAL HISTORY: Elevated liver enzymes. FINDINGS: LIVER: The liver is hyperechoic and somewhat heterogeneous. No discrete lesions are present. BILIARY SYSTEM: Cholecystectomy. Common bile duct is dilated to 14 mm. RIGHT KIDNEY: The right kidney is grossly unremarkable in appearances without evidence of hydronephrosis, echogenic calculi or worrisome mass lesions. PANCREAS: Visualized portions of the  pancreas are unremarkable. OTHER: No right upper quadrant ascites. IMPRESSION: 1. Hepatic steatosis with somewhat heterogeneous echotexture. No discrete lesions. 2. Dilated common bile duct measuring 14 mm. 3. Status post cholecystectomy. Electronically signed by: Lonni Necessary MD 09/03/2023 04:48 AM EDT RP Workstation: [id number]   CT Head Wo Contrast Result Date: 09/02/2023 CLINICAL DATA:  Altered mental status EXAM: CT HEAD WITHOUT CONTRAST TECHNIQUE: Contiguous axial images were obtained from the base of the skull through the vertex without intravenous contrast. RADIATION DOSE REDUCTION: This exam was performed according to the departmental dose-optimization program which includes automated exposure control, adjustment of the mA and/or kV according to patient size and/or use of iterative reconstruction technique. COMPARISON:  05/30/2023 FINDINGS: Brain: Diffuse cerebral and cerebellar atrophy. No acute intracranial abnormality. Specifically, no hemorrhage, hydrocephalus, mass lesion, acute infarction, or significant intracranial injury. Vascular: No hyperdense vessel or unexpected calcification. Skull: No acute calvarial abnormality. Sinuses/Orbits: No acute findings Other: None IMPRESSION: No acute intracranial abnormality. Electronically Signed   By: Franky Crease M.D.   On: 09/02/2023 19:52   DG Chest Port 1 View Result Date: 09/02/2023 CLINICAL DATA:  Possible sepsis EXAM: PORTABLE CHEST 1 VIEW COMPARISON:  05/31/2023 FINDINGS: Cardiac shadow is within normal limits. Lungs are well aerated bilaterally. No focal infiltrate or effusion is seen. No bony abnormality is noted. IMPRESSION: No active disease. Electronically Signed   By: Oneil Devonshire M.D.   On: 09/02/2023 19:31    ASSESSMENT/PLAN:  Assessment: Principal Problem:   Lower urinary tract infectious disease Active Problems:   Altered mental status   Elevated LFTs   Urinary tract infection   Plan:  SIRS:   Pulse rate 101  bpm.WBC increased to 19.6 from 13. Patient had an episode of fever last night; received Tylenol .  Plan:  -Continue ceftriaxone  1g IV daily. -Monitor vital signs and fever episodes. -Review blood and urine culture results when available to guide antibiotic therapy. -Follow up on Abdominopelvic CT findings to assess for complicated UTI.  #Urinary Tract Infection  E. coli bacteremia and urinary tract infection diagnosed on 4/25. Patient denies dysuria or suprapubic pain; however, limited cooperation during physical exam prevented thorough suprapubic assessment. No CVA tenderness noted on exam. Despite ongoing treatment with ceftriaxone , patient continues to have episodes of fever, raising concern for a complicated UTI.  Plan: -Continue ceftriaxone  1g IV daily in 200 mL (currently Day 3 of therapy). -  Obtain abdominal and pelvic CT scan with and without contrast to evaluate for complicated UTI. -Monitor patient's clinical status and fever pattern closely. -Reassess antibiotic regimen based on imaging and clinical response.  Altered mental status:  Daughter reports patient was at baseline on Saturday but worsened Sunday. Due to no provider availability at [facility], patient was brought to the ED. Daughter now notes significant improvement. Today, patient shows improvement from yesterday and recalls seeing her daughter and staff.  -Avoid polypharmacy -Being treated for UTI with ceftriaxone  1g IV daily, Day 3     #IDC, stage IIIB -Continue Arimidex  1 mg daily   #History of CVA - No focal deficit and sign of new stroke -Continue 81 mg aspirin  daily   #Constipation -Continue Dulcolax 10 mg as needed -Continue Senna -Continue MiraLAX    #GERD -Continue famotidine  20 mg daily   #Epilepsy Patient has history of seizures including during last admission in April 2025.  Neurology was consulted and determined likely source is from hypoglycemia.  No concern for seizure-like activity at  this time. -Continue Depakote  250 mg twice daily  Best Practice: Diet: Regular diet IVF: Fluids: None VTE: enoxaparin  (LOVENOX ) injection 40 mg Start: 09/03/23 1000 Code: DNR  Disposition planning: Therapy Recs: SNF Family Contact: Daughter, called and notified. DISPO: Anticipated discharge in 1 days to Skilled nursing facility pending clinical improvement.  Signature:  Milanni Ayub [facility]  6:53 PM, 09/03/2023  On Call pager [phone]

## 2023-09-03 NOTE — Care Management Obs Status (Signed)
 MEDICARE OBSERVATION STATUS NOTIFICATION   Patient Details  Name: Carolyn Parrish MRN: [id number] Date of Birth: 10/12/1940   Medicare Observation Status Notification Given:  Yes  Spoke with Daughter Mithra Spano [phone] and advised that a letter will be left in Mother room her her tomorrow  Claretta Deed 09/03/2023, 4:30 PM

## 2023-09-03 NOTE — Progress Notes (Addendum)
 Spoke to [place] on the phone from [place].  Patient's baseline is that she is alert and oriented x 3.  She is able to recognize self, location, and able to tell who other people are.  The nurse at [facility] was not able to tell me much more information except for patient was febrile, but unable to tell me what her temp was.  Patient also reported having some back pain, but otherwise not able to give me much information.  Patient does have a hard time communicating and has indwelling Catheter in place.  Patient has not been complaining about anything at the facility.  Will speak to daughter to get more collateral.  Spoke to daughter at bedside: Per daughter, patient was in normal state of health on Saturday.  At baseline, patient is able to interact and is alert and oriented x 3.  On Sunday 07/27, the patient was altered, and had incoherent speech as well as inappropriate responses.  The daughter became worried about a potential urinary infection.  She requested urinary studies, but the facility said that it would have to be done today 07/28.  The daughter then stated that she would like the patient to be sent to the emergency department for expedited workup. Today, the daughter states that the patient has improved but in not back to baseline. Talking with the daughter today there is some concern that patient does not recognize daughter at times and able to recognize at other times. Patient does not have the most appropriate responses.

## 2023-09-03 NOTE — Progress Notes (Signed)
 Sent a message to attending regarding blood sugar check orders; pt is NPO and has history of DM. Awaiting orders

## 2023-09-03 NOTE — Progress Notes (Signed)
 Pt is now Carolyn Parrish.  Overnight no issues.  Pt remained calm and cooperative. Mittens still applied. Pt did pull at her foley a bit.

## 2023-09-03 NOTE — Hospital Course (Addendum)
  Knows name, with prompts she knew she was at hospital, [place] doesn't ring a bell for her.  + BL thumbs up + BL wiggle toes Already ate, she told you eggs Tries to follow your commands w head movements

## 2023-09-03 NOTE — Plan of Care (Signed)

## 2023-09-04 ENCOUNTER — Encounter (HOSPITAL_COMMUNITY): Payer: Self-pay | Admitting: Infectious Diseases

## 2023-09-04 ENCOUNTER — Inpatient Hospital Stay (HOSPITAL_COMMUNITY)

## 2023-09-04 DIAGNOSIS — R4182 Altered mental status, unspecified: Secondary | ICD-10-CM

## 2023-09-04 DIAGNOSIS — K5641 Fecal impaction: Secondary | ICD-10-CM

## 2023-09-04 DIAGNOSIS — T85520A Displacement of bile duct prosthesis, initial encounter: Secondary | ICD-10-CM

## 2023-09-04 DIAGNOSIS — F0390 Unspecified dementia without behavioral disturbance: Secondary | ICD-10-CM

## 2023-09-04 DIAGNOSIS — R933 Abnormal findings on diagnostic imaging of other parts of digestive tract: Secondary | ICD-10-CM

## 2023-09-04 LAB — CBC
HCT: 34.6 % — ABNORMAL LOW (ref 36.0–46.0)
Hemoglobin: 10.9 g/dL — ABNORMAL LOW (ref 12.0–15.0)
MCH: 27.3 pg (ref 26.0–34.0)
MCHC: 31.5 g/dL (ref 30.0–36.0)
MCV: 86.7 fL (ref 80.0–100.0)
Platelets: 360 K/uL (ref 150–400)
RBC: 3.99 MIL/uL (ref 3.87–5.11)
RDW: 21 % — ABNORMAL HIGH (ref 11.5–15.5)
WBC: 19.6 K/uL — ABNORMAL HIGH (ref 4.0–10.5)
nRBC: 0 % (ref 0.0–0.2)

## 2023-09-04 LAB — COMPREHENSIVE METABOLIC PANEL WITH GFR
ALT: 56 U/L — ABNORMAL HIGH (ref 0–44)
AST: 51 U/L — ABNORMAL HIGH (ref 15–41)
Albumin: 2.5 g/dL — ABNORMAL LOW (ref 3.5–5.0)
Alkaline Phosphatase: 342 U/L — ABNORMAL HIGH (ref 38–126)
Anion gap: 10 (ref 5–15)
BUN: 12 mg/dL (ref 8–23)
CO2: 23 mmol/L (ref 22–32)
Calcium: 8.9 mg/dL (ref 8.9–10.3)
Chloride: 106 mmol/L (ref 98–111)
Creatinine, Ser: 0.64 mg/dL (ref 0.44–1.00)
GFR, Estimated: >60 mL/min (ref >60)
Glucose, Bld: 117 mg/dL — ABNORMAL HIGH (ref 70–99)
Potassium: 3.4 mmol/L — ABNORMAL LOW (ref 3.5–5.1)
Sodium: 139 mmol/L (ref 135–145)
Total Bilirubin: 1 mg/dL (ref 0.0–1.2)
Total Protein: 7 g/dL (ref 6.5–8.1)

## 2023-09-04 LAB — GLUCOSE, CAPILLARY
Glucose-Capillary: 133 mg/dL — ABNORMAL HIGH (ref 70–99)
Glucose-Capillary: 113 mg/dL — ABNORMAL HIGH (ref 70–99)
Glucose-Capillary: 96 mg/dL (ref 70–99)
Glucose-Capillary: 102 mg/dL — ABNORMAL HIGH (ref 70–99)
Glucose-Capillary: 82 mg/dL (ref 70–99)

## 2023-09-04 MED ORDER — LACTATED RINGERS IV BOLUS
1000.0000 mL | Freq: Once | INTRAVENOUS | Status: AC
  Administered 2023-09-04: 1000 mL via INTRAVENOUS

## 2023-09-04 MED ORDER — POTASSIUM CHLORIDE CRYS ER 20 MEQ PO TBCR
40.0000 meq | EXTENDED_RELEASE_TABLET | Freq: Once | ORAL | Status: AC
  Administered 2023-09-04: 40 meq via ORAL
  Filled 2023-09-04: qty 2

## 2023-09-04 MED ORDER — IOHEXOL 350 MG/ML SOLN
50.0000 mL | Freq: Once | INTRAVENOUS | Status: AC | PRN
  Administered 2023-09-04: 50 mL via INTRAVENOUS

## 2023-09-04 MED ORDER — SODIUM CHLORIDE 0.9 % IV SOLN: INTRAVENOUS | Status: AC

## 2023-09-04 MED ORDER — IOHEXOL 350 MG/ML SOLN
75.0000 mL | Freq: Once | INTRAVENOUS | Status: AC | PRN
  Administered 2023-09-04: 75 mL via INTRAVENOUS

## 2023-09-04 MED ORDER — ACETAMINOPHEN 325 MG PO TABS
650.0000 mg | ORAL_TABLET | Freq: Once | ORAL | Status: AC
  Administered 2023-09-04: 650 mg via ORAL
  Filled 2023-09-04: qty 2

## 2023-09-04 MED ORDER — SMOG ENEMA
960.0000 mL | Freq: Once | RECTAL | Status: AC
  Administered 2023-09-04: 960 mL via RECTAL
  Filled 2023-09-04: qty 960

## 2023-09-04 MED ORDER — ENOXAPARIN SODIUM 30 MG/0.3ML IJ SOSY: 30.0000 mg | PREFILLED_SYRINGE | INTRAMUSCULAR | Status: DC

## 2023-09-04 NOTE — Plan of Care (Signed)

## 2023-09-04 NOTE — Plan of Care (Addendum)
 Pt not responsive verbally to any questioning. Does not follow commands. Agitated when touched and/or moved. Attempted multiple times to have pt swallow small amount of applesauce as well as PO fluids, pt uncooperative. Pt's mouth remained open during attempts and she would not close her mouth to attempt to swallow. HOB 30 degrees or greater given aspiration risk. Weak congested cough noted. On-call Dr. Waymond notified of inability to administer PM medications and lack of cooperation w/ any other PO intake at this time. BP hypotensive at onset of shift, other vital signs stable and WNL. Dr. Waymond notified, 1L LR bolus ordered and administered. BP improved s/p bolus (MD notified). PM blood glucose 82, concern for hypoglycemia with poor PO intake and NPO status at 00:00. No continuous IV fluids ordered at this time. Per conversation w/ Dr. Tan at bedside, PRN spot check blood glucose at 00:00 & 04:00.   SMOG enema administered s/p bolus completion. Most of enema retained during administration. Pt had medium liquid BM immediately after enema completion. Foley catheter cleaned s/p bowel incontinence. Bed in low locked position w/ alarm activated. Fall precautions in place. Q2 turns maintained, heels elevated off bed w/ pillow support. Call bell within reach. Door open.   Problem: Education: Goal: Knowledge of General Education information will improve Description: Including pain rating scale, medication(s)/side effects and non-pharmacologic comfort measures Outcome: Progressing   Problem: Health Behavior/Discharge Planning: Goal: Ability to manage health-related needs will improve Outcome: Progressing   Problem: Clinical Measurements: Goal: Ability to maintain clinical measurements within normal limits will improve Outcome: Progressing Goal: Will remain free from infection Outcome: Progressing Goal: Diagnostic test results will improve Outcome: Progressing Goal: Respiratory complications will  improve Outcome: Progressing Goal: Cardiovascular complication will be avoided Outcome: Progressing   Problem: Activity: Goal: Risk for activity intolerance will decrease Outcome: Progressing   Problem: Nutrition: Goal: Adequate nutrition will be maintained Outcome: Progressing   Problem: Coping: Goal: Level of anxiety will decrease Outcome: Progressing   Problem: Elimination: Goal: Will not experience complications related to bowel motility Outcome: Progressing Goal: Will not experience complications related to urinary retention Outcome: Progressing   Problem: Pain Managment: Goal: General experience of comfort will improve and/or be controlled Outcome: Progressing   Problem: Safety: Goal: Ability to remain free from injury will improve Outcome: Progressing   Problem: Skin Integrity: Goal: Risk for impaired skin integrity will decrease Outcome: Progressing

## 2023-09-04 NOTE — Consult Note (Addendum)
 Consultation  Referring Provider:   Dr. Eben   Primary Care Physician:  Pcp, No Primary Gastroenterologist: Sampson        Reason for Consultation: Abnormal CT Abdomen             HPI:   Carolyn Parrish is a 83 y.o. female with past medical as listed below including CHF, diabetes, schizoaffective disorder and stroke, who presented to the hospital on 7/27 with altered mental status and potential UTI.  We are consulted now in regards to an abnormal CT of the abdomen showing possible colon mass and a migrated CBD stent.    At time of admission HPI was limited due to underlying mental status changes.  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  Currently living at [facility] she was noted to be febrile.    CT abdomen pelvis with and without contrast today showed an approximate sigmoid colon exhibiting irregular heterogenous wall with extension of soft tissue beyond the serosa, blastic process with disease.  The lesion is not obstructing.  3 solid noncalcified nodules in the bilateral lobes are largest in the right lower lobe abutting the pleura measuring 1.6 x 1.8 cm which were highly concerning for metastasis.  Also inferior migration of previously seen CBD stent with lower half of the stent now with drainage in the duodenum resulting in mild to moderate intrahepatic and extrahepatic bile duct dilation.  Bilateral urethral without significant periprostatic fat stranding.  Mild mucosal hyperattenuation of the urinary bladder.    Today, patient is unable to provide any of her history given altered mental status.  I tried to speak with nursing but they were unavailable at time of my visit with the patient and unavailable when I called back up to the room later.  Per the hospitalist/teaching service the patient has not had any symptoms of nausea, vomiting or complaints of abdominal pain other than lower pelvic pain which they think is related to UTI. I did call  the patient's daughter and discuss things with her further.  Her primary concern was the altered mental status.  Apparently patient has had altered mental status in the past with UTIs so she is hoping when the antibiotics help clear that she will be back to her normal self which is conversive.    I did discuss findings of CT with the patient's daughter including possible sigmoid mass and migrated CBD stent.  She describes history of an emergent gallbladder surgery in 2015, she thinks maybe the stent was placed then but it was outside of our system and she is not exactly sure where.  She was unaware that the stent should have been removed.    Patient's daughter denies any nausea, vomiting or abdominal pain complaints.  Previous GI History: Unavailable  Past Medical History:  Diagnosis Date   Cancer (HCC)    BREAST CANCER   CHF (congestive heart failure) (HCC)    DM (diabetes mellitus), secondary, with peripheral vascular complications (HCC)    Hyperlipidemia    Hypertension    Schizoaffective disorder (HCC)    Stroke [facility])     Past Surgical History:  Procedure Laterality Date   CHOLECYSTECTOMY     2016   MASTECTOMY COMPLETE / SIMPLE Left 10/30/2017   SIMPLE MASTECTOMY WITH AXILLARY SENTINEL NODE BIOPSY Left 10/30/2017   Procedure: LEFT SIMPLE MASTECTOMY;  Surgeon: Vanderbilt Ned, MD;  Location: [facility] OR;  Service: General;  Laterality: Left;    History reviewed.  No pertinent family history.   Social History   Tobacco Use   Smoking status: Never   Smokeless tobacco: Never  Vaping Use   Vaping status: Never Used  Substance Use Topics   Alcohol use: Not Currently   Drug use: Never    Prior to Admission medications   Medication Sig Start Date End Date Taking? Authorizing Provider  acetaminophen  (TYLENOL ) 500 MG tablet Take 500 mg by mouth every 4 (four) hours as needed.   Yes [provider]  anastrozole  (ARIMIDEX ) 1 MG tablet Take 1 tablet (1 mg total) by mouth daily.  11/23/17  Yes Magrinat, Sandria BROCKS, MD  aspirin  EC 81 MG tablet Take 1 tablet (81 mg total) by mouth daily. 06/12/23  Yes Pokhrel, Laxman, MD  bisacodyl  (DULCOLAX) 10 MG suppository Place 1 suppository (10 mg total) rectally as needed for moderate constipation or severe constipation. 06/12/23  Yes Pokhrel, Laxman, MD  Cholecalciferol 25 MCG (1000 UT) tablet Take 1,000 Units by mouth daily.   Yes [provider]  divalproex  (DEPAKOTE  SPRINKLE) 125 MG capsule Take 500 mg by mouth daily. Give 4 capsules by mouth one time a day.   Yes [provider]  famotidine  (PEPCID ) 20 MG tablet Take 20 mg by mouth daily. 04/18/23  Yes [provider]  Multiple Vitamin (MULTIVITAMIN WITH MINERALS) TABS tablet Take 1 tablet by mouth daily. 06/13/23  Yes Pokhrel, Laxman, MD  polyethylene glycol (MIRALAX  / GLYCOLAX ) packet Take 17 g by mouth daily.   Yes [provider]  Protein (FEEDING SUPPLEMENT, PROSOURCE TF20,) liquid Place 30 mLs into feeding tube daily.   Yes [provider]  sennosides-docusate sodium  (SENOKOT-S) 8.6-50 MG tablet Take 1 tablet by mouth at bedtime.   Yes [provider]    Current Facility-Administered Medications  Medication Dose Route Frequency Provider Last Rate Last Admin   anastrozole  (ARIMIDEX ) tablet 1 mg  1 mg Oral Daily Nooruddin, Saad, MD   1 mg at 09/04/23 0910   aspirin  EC tablet 81 mg  81 mg Oral Daily Nooruddin, Saad, MD   81 mg at 09/04/23 0907   bisacodyl  (DULCOLAX) suppository 10 mg  10 mg Rectal Daily PRN Nooruddin, Saad, MD       cefTRIAXone  (ROCEPHIN ) 1 g in sodium chloride  0.9 % 100 mL IVPB  1 g Intravenous Q24H Nooruddin, Saad, MD 200 mL/hr at 09/04/23 1303 1 g at 09/04/23 1303   Chlorhexidine  Gluconate Cloth 2 % PADS 6 each  6 each Topical Daily Eben Reyes BROCKS, MD   6 each at 09/04/23 9092   divalproex  (DEPAKOTE  SPRINKLE) capsule 250 mg  250 mg Oral BID Nooruddin, Saad, MD   250 mg at 09/04/23 9092   enoxaparin  (LOVENOX )  injection 40 mg  40 mg Subcutaneous Q24H Nooruddin, Saad, MD   40 mg at 09/04/23 9093   famotidine  (PEPCID ) tablet 20 mg  20 mg Oral Daily Nooruddin, Saad, MD   20 mg at 09/04/23 9092   polyethylene glycol (MIRALAX  / GLYCOLAX ) packet 17 g  17 g Oral Daily Nooruddin, Saad, MD   17 g at 09/04/23 9092   senna-docusate (Senokot-S) tablet 1 tablet  1 tablet Oral QHS Nooruddin, Saad, MD   1 tablet at 09/03/23 2258    Allergies as of 09/02/2023 - Review Complete 09/02/2023  Allergen Reaction Noted   Atorvastatin  09/12/2017     Review of Systems:    Unable to complete due to AMS   Physical Exam:  Vital signs in last  24 hours: Temp:  [97.9 F (36.6 C)-102.3 F (39.1 C)] 98.8 F (37.1 C) (07/29 0834) Pulse Rate:  [100-116] 104 (07/29 1100) Resp:  [16-19] 16 (07/29 0534) BP: (137-172)/(66-82) 151/82 (07/29 1100) SpO2:  [99 %-100 %] 100 % (07/29 1100) Last BM Date : 09/03/23 General:   Thin appearing, African-American female appears to be in NAD, Well developed, Well nourished, alert, + in soft mitts Head:  Normocephalic and atraumatic. Eyes:   PEERL, EOMI. No icterus. Conjunctiva pink. Ears:  Normal auditory acuity. Neck:  Supple Throat: Oral cavity and pharynx without inflammation, swelling or lesion.  Lungs: Respirations even and unlabored. Lungs clear to auscultation bilaterally.   No wheezes, crackles, or rhonchi.  Heart: Normal S1, S2. No MRG. Regular rate and rhythm. No peripheral edema, cyanosis or pallor.  Abdomen:  Soft, nondistended, + complains of pain/groans in all 4 quadrants of the abdomen. No rebound or guarding. Normal bowel sounds. Rectal:  Not performed.  Msk:  Symmetrical without gross deformities. Peripheral pulses intact.  Extremities:  Without edema, no deformity or joint abnormality. Neurologic:  Alert and  oriented x2 (can say her name and knows she is at the hospital) Skin:   Dry and intact without significant lesions or rashes. Psychiatric: Incoherent when  talking, unable to answer questions  LAB RESULTS: Recent Labs    09/02/23 1823 09/03/23 0413 09/04/23 0422  WBC 14.8* 13.0* 19.6*  HGB 11.2* 10.3* 10.9*  HCT 36.4 33.5* 34.6*  PLT 448* 316 360   BMET Recent Labs    09/02/23 1920 09/03/23 0413 09/04/23 0422  NA 141 140 139  K 4.7 3.7 3.4*  CL 110 106 106  CO2 21* 21* 23  GLUCOSE 100* 95 117*  BUN 17 13 12   CREATININE 0.61 0.59 0.64  CALCIUM  8.3* 8.9 8.9      Latest Ref Rng & Units 09/04/2023    4:22 AM 09/03/2023    4:12 AM 09/02/2023    7:20 PM  Hepatic Function  Total Protein 6.5 - 8.1 g/dL 7.0  6.9  6.4   Albumin  3.5 - 5.0 g/dL 2.5  2.4  2.4   AST 15 - 41 U/L 51  51  52   ALT 0 - 44 U/L 56  62  67   Alk Phosphatase 38 - 126 U/L 342  251  274   Total Bilirubin 0.0 - 1.2 mg/dL 1.0  1.1  1.0   Bilirubin, Direct 0.0 - 0.2 mg/dL  0.3       PT/INR Recent Labs    09/02/23 1823  LABPROT 14.2  INR 1.0    STUDIES: CT ABDOMEN PELVIS W WO CONTRAST Result Date: 09/04/2023 CLINICAL DATA:  UTI, recurrent/complicated (Female). * Tracking Code: BO * EXAM: CT ABDOMEN AND PELVIS WITHOUT AND WITH CONTRAST TECHNIQUE: Multidetector CT imaging of the abdomen and pelvis was performed following the standard protocol before and following the bolus administration of intravenous contrast. RADIATION DOSE REDUCTION: This exam was performed according to the departmental dose-optimization program which includes automated exposure control, adjustment of the mA and/or kV according to patient size and/or use of iterative reconstruction technique. CONTRAST:  75mL OMNIPAQUE  IOHEXOL  350 MG/ML SOLN COMPARISON:  CT scan abdomen and pelvis from 01/08/2018. FINDINGS: Lower chest: There are at least 3 solid noncalcified nodules in the bilateral lower lobes with largest in the right lung lower lobe abutting the pleura measuring 1.6 x 1.8 cm. These are highly concerning for metastases. There are atelectatic changes in the bilateral lower lobes.  No pleural  effusion. Normal cardiac size. No pericardial effusion. Hepatobiliary: The liver is normal in size. Non-cirrhotic configuration. No suspicious mass. There is moderate extrahepatic bile duct dilation, measuring up to 1.5 cm in diameter, new since the prior study. There is inferior migration of previously seen CBD stent with lower half of the stent now protruding within the duodenum. There is also resultant mild to moderate intrahepatic bile duct dilation. Gallbladder is surgically absent. Pancreas: Unremarkable. No pancreatic ductal dilatation or surrounding inflammatory changes. Spleen: Normal-sized spleen. There is a subcapsular subcentimeter sized hypoattenuating focus along the posteromedial aspect, which is too small to adequately characterize. Adrenals/Urinary Tract: Adrenal glands are unremarkable. No suspicious renal mass. Redemonstration of bilateral extrarenal pelvis. There is bilateral urothelial thickening however, without significant perivesical fat stranding. Findings favor sequela of chronic urinary tract infection. However, correlate clinically and with urinalysis to exclude acute on chronic UTI. No nephroureterolithiasis on either side. Urinary bladder is decompressed secondary to a Foley catheter. There is mild mucosal hyperattenuation of the urinary bladder without significant perivesical fat stranding. Correlate clinically and with urinalysis to exclude chronic versus acute cystitis. Stomach/Bowel: No disproportionate dilation of the small or large bowel loops. Unremarkable appendix. There are multiple scattered diverticula mainly throughout the left hemicolon without diverticulitis. There is moderate-to-large amount of stool with large fecaloma in the rectum which is distended up to 9.6 cm in diameter. There is an approximately 5 cm long segment of proximal sigmoid colon exhibiting irregular heterogeneous wall thickening as well as area of extension of soft tissue beyond the serosa, compatible  with neoplastic process with trans serosal disease. However, the lesion is not obstructing. Vascular/Lymphatic: No ascites or pneumoperitoneum. No abdominal or pelvic lymphadenopathy, by size criteria. No aneurysmal dilation of the major abdominal arteries. There are moderate peripheral atherosclerotic vascular calcifications of the aorta and its major branches. Reproductive: The uterus is unremarkable. No large adnexal mass. Other: The visualized soft tissues and abdominal wall are unremarkable. Musculoskeletal: No suspicious osseous lesions. There are mild - moderate multilevel degenerative changes in the visualized spine. IMPRESSION: 1. 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 However, the lesion is not obstructing. 2. There are at least 3 solid noncalcified nodules in the bilateral lower lobes with largest in the right lung lower lobe abutting the pleura measuring 1.6 x 1.8 cm. These are highly concerning for metastases. 3. There is inferior migration of previously seen CBD stent with lower half of the stent now protruding within the duodenum. There is resultant mild to moderate intrahepatic and extrahepatic bile duct dilation. 4. There is bilateral urothelial thickening however, without significant perivesical fat stranding. Findings favor sequela of chronic urinary tract infection. However, correlate clinically and with urinalysis to exclude acute on chronic UTI. 5. There is mild mucosal hyperattenuation of the urinary bladder without significant perivesical fat stranding. Correlate clinically and with urinalysis to exclude chronic versus acute cystitis. 6. Multiple other nonacute observations, as described above. Aortic Atherosclerosis (ICD10-I70.0). Electronically Signed   By: Ree Molt M.D.   On:  09/04/2023 14:56   US  Abdomen Limited RUQ (LIVER/GB) Result Date: 09/03/2023 EXAM: Right Upper Quadrant Abdominal Ultrasound 09/03/2023 04:32:05 AM TECHNIQUE: Real-time ultrasonography of the right upper quadrant of the abdomen was performed. COMPARISON: CT of the abdomen and pelvis 01/08/2018. CLINICAL HISTORY: Elevated liver enzymes. FINDINGS: LIVER: The  liver is hyperechoic and somewhat heterogeneous. No discrete lesions are present. BILIARY SYSTEM: Cholecystectomy. Common bile duct is dilated to 14 mm. RIGHT KIDNEY: The right kidney is grossly unremarkable in appearances without evidence of hydronephrosis, echogenic calculi or worrisome mass lesions. PANCREAS: Visualized portions of the pancreas are unremarkable. OTHER: No right upper quadrant ascites. IMPRESSION: 1. Hepatic steatosis with somewhat heterogeneous echotexture. No discrete lesions. 2. Dilated common bile duct measuring 14 mm. 3. Status post cholecystectomy. Electronically signed by: Lonni Necessary MD 09/03/2023 04:48 AM EDT RP Workstation: [id number]   CT Head Wo Contrast Result Date: 09/02/2023 CLINICAL DATA:  Altered mental status EXAM: CT HEAD WITHOUT CONTRAST TECHNIQUE: Contiguous axial images were obtained from the base of the skull through the vertex without intravenous contrast. RADIATION DOSE REDUCTION: This exam was performed according to the departmental dose-optimization program which includes automated exposure control, adjustment of the mA and/or kV according to patient size and/or use of iterative reconstruction technique. COMPARISON:  05/30/2023 FINDINGS: Brain: Diffuse cerebral and cerebellar atrophy. No acute intracranial abnormality. Specifically, no hemorrhage, hydrocephalus, mass lesion, acute infarction, or significant intracranial injury. Vascular: No hyperdense vessel or unexpected calcification. Skull: No acute calvarial abnormality. Sinuses/Orbits: No acute findings Other: None IMPRESSION: No acute intracranial  abnormality. Electronically Signed   By: Franky Crease M.D.   On: 09/02/2023 19:52   DG Chest Port 1 View Result Date: 09/02/2023 CLINICAL DATA:  Possible sepsis EXAM: PORTABLE CHEST 1 VIEW COMPARISON:  05/31/2023 FINDINGS: Cardiac shadow is within normal limits. Lungs are well aerated bilaterally. No focal infiltrate or effusion is seen. No bony abnormality is noted. IMPRESSION: No active disease. Electronically Signed   By: Oneil Devonshire M.D.   On: 09/02/2023 19:31    Impression / Plan:   Impression: 1.  Abnormal CT of the abdomen and pelvis: With sigmoid colon abnormality with questionable neoplasm and potential metastasis to the lung as well as a migrated CBD stent, history of cholecystectomy in 2015 per family-no history found in the computer, family unaware of colonoscopy previously 2.  Recurrent UTI: With continued altered mental status, currently on Rocephin  3.  History of stroke with residual deficits: Currently on Lovenox  4.  Elevated LFTs: Likely due to retained stent, imaging showing dilation of hepatic ducts  Plan: 1.  Discussed case with Dr. Wilhelmenia our biliary specialist.  He recommends an ERCP with removal of stent as well as possible intraoperative cholangiogram.  Also recommends a flex sigmoidoscopy.  We will try to schedule these for tomorrow afternoon pending endoscopy availability.  Did discuss risk, physical limitations and alternatives with the patient's daughter and she agrees to proceed.  She will need to be called tomorrow in order to obtain consent. 2.  Continue antibiotics for UTI 3.  Held Lovenox  dose scheduled for 10:00 tomorrow given potential for procedures. 4.  Patient's daughter is her POA and will need to sign any consents. 5.  Ordered a SMOG enema today given retained fecal material/fecaloma seen on imaging.  May need to repeat in order to clear the rectum for possible flex sig.  Thank you for your kind consultation, we will continue to follow.  Delon Gibson [facility]  09/04/2023, 3:51 PM

## 2023-09-04 NOTE — Progress Notes (Signed)
 SUBJECTIVE:  Patient Summary: Carolyn Parrish is a 83 y.o. 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   Overnight Events: fever?  Interim History:   OBJECTIVE:  Vital Signs: Vitals:   09/04/23 0534 09/04/23 0834 09/04/23 1100 09/04/23 1633  BP: 138/66 (!) 145/72 (!) 151/82 (!) (P) 151/55  Pulse: (!) 101 100 (!) 104 (!) (P) 103  Resp: 16     Temp: 98.7 F (37.1 C) 98.8 F (37.1 C)    TempSrc: Oral Oral    SpO2: 100% 100% 100% (P) 100%  Weight:      Height:       Supplemental O2: Nasal Cannula SpO2: (P) 100 %  Filed Weights   09/02/23 1815  Weight: 65 kg     Intake/Output Summary (Last 24 hours) at 09/04/2023 1749 Last data filed at 09/04/2023 1303 Gross per 24 hour  Intake 277 ml  Output 1500 ml  Net -1223 ml   Net IO Since Admission: -1,192.18 mL [09/04/23 1749]  Physical Exam: Physical Exam   Expand All Collapse All        Consultation   Referring Provider:   Dr. Eben   Primary Care Physician:  Pcp, No Primary Gastroenterologist: Sampson        Reason for Consultation: Abnormal CT Abdomen             HPI:   Carolyn Parrish is a 83 y.o. female with past medical as listed below including CHF, diabetes, schizoaffective disorder and stroke, who presented to the hospital on 7/27 with altered mental status and potential UTI.  We are consulted now in regards to an abnormal CT of the abdomen showing possible colon mass and a migrated CBD stent.    At time of admission HPI was limited due to underlying mental status changes.  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  Currently living at [facility] she was noted to be febrile.    CT abdomen pelvis with and without contrast today showed an approximate sigmoid colon exhibiting irregular  heterogenous wall with extension of soft tissue beyond the serosa, blastic process with disease.  The lesion is not obstructing.  3 solid noncalcified nodules in the bilateral lobes are largest in the right lower lobe abutting the pleura measuring 1.6 x 1.8 cm which were highly concerning for metastasis.  Also inferior migration of previously seen CBD stent with lower half of the stent now with drainage in the duodenum resulting in mild to moderate intrahepatic and extrahepatic bile duct dilation.  Bilateral urethral without significant periprostatic fat stranding.  Mild mucosal hyperattenuation of the urinary bladder.    Today, patient is unable to provide any of her history given altered mental status.  I tried to speak with nursing but they were unavailable at time of my visit with the patient and unavailable when I called back up to the room later.  Per the hospitalist/teaching service the patient has not had any symptoms of nausea, vomiting or complaints of abdominal pain other than lower pelvic pain which they think is related to UTI. I did call the patient's daughter and discuss things with her further.  Her primary concern was the altered mental status.  Apparently patient has had altered mental status in the past with UTIs so she is hoping  when the antibiotics help clear that she will be back to her normal self which is conversive.    I did discuss findings of CT with the patient's daughter including possible sigmoid mass and migrated CBD stent.  She describes history of an emergent gallbladder surgery in 2015, she thinks maybe the stent was placed then but it was outside of our system and she is not exactly sure where.  She was unaware that the stent should have been removed.    Patient's daughter denies any nausea, vomiting or abdominal pain complaints.   Previous GI History: Unavailable       Past Medical History:  Diagnosis Date   Cancer (HCC)      BREAST CANCER   CHF (congestive heart  failure) (HCC)     DM (diabetes mellitus), secondary, with peripheral vascular complications (HCC)     Hyperlipidemia     Hypertension     Schizoaffective disorder (HCC)     Stroke [facility])                 Past Surgical History:  Procedure Laterality Date   CHOLECYSTECTOMY        2016   MASTECTOMY COMPLETE / SIMPLE Left 10/30/2017   SIMPLE MASTECTOMY WITH AXILLARY SENTINEL NODE BIOPSY Left 10/30/2017    Procedure: LEFT SIMPLE MASTECTOMY;  Surgeon: Vanderbilt Ned, MD;  Location: [facility] OR;  Service: General;  Laterality: Left;          History reviewed. No pertinent family history.        Social History  Social History        Tobacco Use   Smoking status: Never   Smokeless tobacco: Never  Vaping Use   Vaping status: Never Used  Substance Use Topics   Alcohol use: Not Currently   Drug use: Never               Prior to Admission medications   Medication Sig Start Date End Date Taking? Authorizing Provider  acetaminophen  (TYLENOL ) 500 MG tablet Take 500 mg by mouth every 4 (four) hours as needed.     Yes [provider]  anastrozole  (ARIMIDEX ) 1 MG tablet Take 1 tablet (1 mg total) by mouth daily. 11/23/17   Yes Magrinat, Sandria BROCKS, MD  aspirin  EC 81 MG tablet Take 1 tablet (81 mg total) by mouth daily. 06/12/23   Yes Pokhrel, Laxman, MD  bisacodyl  (DULCOLAX) 10 MG suppository Place 1 suppository (10 mg total) rectally as needed for moderate constipation or severe constipation. 06/12/23   Yes Pokhrel, Laxman, MD  Cholecalciferol 25 MCG (1000 UT) tablet Take 1,000 Units by mouth daily.     Yes [provider]  divalproex  (DEPAKOTE  SPRINKLE) 125 MG capsule Take 500 mg by mouth daily. Give 4 capsules by mouth one time a day.     Yes [provider]  famotidine  (PEPCID ) 20 MG tablet Take 20 mg by mouth daily. 04/18/23   Yes [provider]  Multiple Vitamin (MULTIVITAMIN WITH MINERALS) TABS tablet Take 1 tablet by mouth daily. 06/13/23   Yes Pokhrel,  Laxman, MD  polyethylene glycol (MIRALAX  / GLYCOLAX ) packet Take 17 g by mouth daily.     Yes [provider]  Protein (FEEDING SUPPLEMENT, PROSOURCE TF20,) liquid Place 30 mLs into feeding tube daily.     Yes [provider]  sennosides-docusate sodium  (SENOKOT-S) 8.6-50 MG tablet Take 1 tablet by mouth at bedtime.     Yes [provider]  Current Facility-Administered Medications  Medication Dose Route Frequency Provider Last Rate Last Admin   anastrozole  (ARIMIDEX ) tablet 1 mg  1 mg Oral Daily Nooruddin, Saad, MD   1 mg at 09/04/23 0910   aspirin  EC tablet 81 mg  81 mg Oral Daily Nooruddin, Saad, MD   81 mg at 09/04/23 9092   bisacodyl  (DULCOLAX) suppository 10 mg  10 mg Rectal Daily PRN Nooruddin, Saad, MD       cefTRIAXone  (ROCEPHIN ) 1 g in sodium chloride  0.9 % 100 mL IVPB  1 g Intravenous Q24H Nooruddin, Saad, MD 200 mL/hr at 09/04/23 1303 1 g at 09/04/23 1303   Chlorhexidine  Gluconate Cloth 2 % PADS 6 each  6 each Topical Daily Eben Reyes BROCKS, MD   6 each at 09/04/23 9092   divalproex  (DEPAKOTE  SPRINKLE) capsule 250 mg  250 mg Oral BID Nooruddin, Saad, MD   250 mg at 09/04/23 9092   enoxaparin  (LOVENOX ) injection 40 mg  40 mg Subcutaneous Q24H Nooruddin, Saad, MD   40 mg at 09/04/23 9093   famotidine  (PEPCID ) tablet 20 mg  20 mg Oral Daily Nooruddin, Saad, MD   20 mg at 09/04/23 9092   polyethylene glycol (MIRALAX  / GLYCOLAX ) packet 17 g  17 g Oral Daily Nooruddin, Saad, MD   17 g at 09/04/23 9092   senna-docusate (Senokot-S) tablet 1 tablet  1 tablet Oral QHS Nooruddin, Saad, MD   1 tablet at 09/03/23 2258               Allergies as of 09/02/2023 - Review Complete 09/02/2023  Allergen Reaction Noted   Atorvastatin   09/12/2017        Review of Systems:    Unable to complete due to AMS    Physical Exam:   General: Thin appearing, + in soft mitts Lungs: Respirations even and unlabored. Lungs clear to auscultation bilaterally.   No  wheezes, crackles, or rhonchi.  Heart: Normal S1, S2. No MRG. Regular rate and rhythm. No peripheral edema, cyanosis or pallor.  Abdomen:  Soft, nondistended, + complains of pain/groans in suprapubic region  Extremities:  Without edema, no deformity or joint abnormality. Neurologic:  Alert and  oriented x2 (can say her name and knows she is at the hospital) Psychiatric: Incoherent when talking, unable to answer questions      Patient Lines/Drains/Airways Status     Active Line/Drains/Airways     Name Placement date Placement time Site Days   Peripheral IV 09/02/23 20 G Right Hand 09/02/23  1824  Hand  2   Peripheral IV 09/02/23 18 G Left Forearm 09/02/23  1825  Forearm  2   Urethral Catheter J kantlehner RN Latex 14 Fr. 09/02/23  1903  Latex  2            Pertinent labs and imaging:  ***    Latest Ref Rng & Units 09/04/2023    4:22 AM 09/03/2023    4:13 AM 09/02/2023    6:23 PM  CBC  WBC 4.0 - 10.5 K/uL 19.6  13.0  14.8   Hemoglobin 12.0 - 15.0 g/dL 89.0  89.6  88.7   Hematocrit 36.0 - 46.0 % 34.6  33.5  36.4   Platelets 150 - 400 K/uL 360  316  448        Latest Ref Rng & Units 09/04/2023    4:22 AM 09/03/2023    4:13 AM 09/03/2023    4:12 AM  CMP  Glucose 70 - 99  mg/dL 882  95    BUN 8 - 23 mg/dL 12  13    Creatinine 9.55 - 1.00 mg/dL 9.35  9.40    Sodium 864 - 145 mmol/L 139  140    Potassium 3.5 - 5.1 mmol/L 3.4  3.7    Chloride 98 - 111 mmol/L 106  106    CO2 22 - 32 mmol/L 23  21    Calcium  8.9 - 10.3 mg/dL 8.9  8.9    Total Protein 6.5 - 8.1 g/dL 7.0   6.9   Total Bilirubin 0.0 - 1.2 mg/dL 1.0   1.1   Alkaline Phos 38 - 126 U/L 342   251   AST 15 - 41 U/L 51   51   ALT 0 - 44 U/L 56   62     CT ABDOMEN PELVIS W WO CONTRAST Result Date: 09/04/2023 CLINICAL DATA:  UTI, recurrent/complicated (Female). * Tracking Code: BO * EXAM: CT ABDOMEN AND PELVIS WITHOUT AND WITH CONTRAST TECHNIQUE: Multidetector CT imaging of the abdomen and pelvis was performed  following the standard protocol before and following the bolus administration of intravenous contrast. RADIATION DOSE REDUCTION: This exam was performed according to the departmental dose-optimization program which includes automated exposure control, adjustment of the mA and/or kV according to patient size and/or use of iterative reconstruction technique. CONTRAST:  75mL OMNIPAQUE  IOHEXOL  350 MG/ML SOLN COMPARISON:  CT scan abdomen and pelvis from 01/08/2018. FINDINGS: Lower chest: There are at least 3 solid noncalcified nodules in the bilateral lower lobes with largest in the right lung lower lobe abutting the pleura measuring 1.6 x 1.8 cm. These are highly concerning for metastases. There are atelectatic changes in the bilateral lower lobes. No pleural effusion. Normal cardiac size. No pericardial effusion. Hepatobiliary: The liver is normal in size. Non-cirrhotic configuration. No suspicious mass. There is moderate extrahepatic bile duct dilation, measuring up to 1.5 cm in diameter, new since the prior study. There is inferior migration of previously seen CBD stent with lower half of the stent now protruding within the duodenum. There is also resultant mild to moderate intrahepatic bile duct dilation. Gallbladder is surgically absent. Pancreas: Unremarkable. No pancreatic ductal dilatation or surrounding inflammatory changes. Spleen: Normal-sized spleen. There is a subcapsular subcentimeter sized hypoattenuating focus along the posteromedial aspect, which is too small to adequately characterize. Adrenals/Urinary Tract: Adrenal glands are unremarkable. No suspicious renal mass. Redemonstration of bilateral extrarenal pelvis. There is bilateral urothelial thickening however, without significant perivesical fat stranding. Findings favor sequela of chronic urinary tract infection. However, correlate clinically and with urinalysis to exclude acute on chronic UTI. No nephroureterolithiasis on either side. Urinary  bladder is decompressed secondary to a Foley catheter. There is mild mucosal hyperattenuation of the urinary bladder without significant perivesical fat stranding. Correlate clinically and with urinalysis to exclude chronic versus acute cystitis. Stomach/Bowel: No disproportionate dilation of the small or large bowel loops. Unremarkable appendix. There are multiple scattered diverticula mainly throughout the left hemicolon without diverticulitis. There is moderate-to-large amount of stool with large fecaloma in the rectum which is distended up to 9.6 cm in diameter. 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 However, the lesion is not obstructing. Vascular/Lymphatic: No ascites or pneumoperitoneum. No abdominal or pelvic lymphadenopathy, by size criteria. No aneurysmal dilation of the major abdominal arteries. There  are moderate peripheral atherosclerotic vascular calcifications of the aorta and its major branches. Reproductive: The uterus is unremarkable. No large adnexal mass. Other: The visualized soft tissues and abdominal wall are unremarkable. Musculoskeletal: No suspicious osseous lesions. There are mild - moderate multilevel degenerative changes in the visualized spine. IMPRESSION: 1. 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 However, the lesion is not obstructing. 2. There are at least 3 solid noncalcified nodules in the bilateral lower lobes with largest in the right lung lower lobe abutting the pleura measuring 1.6 x 1.8 cm. These are highly concerning for metastases. 3. There is inferior migration of  previously seen CBD stent with lower half of the stent now protruding within the duodenum. There is resultant mild to moderate intrahepatic and extrahepatic bile duct dilation. 4. There is bilateral urothelial thickening however, without significant perivesical fat stranding. Findings favor sequela of chronic urinary tract infection. However, correlate clinically and with urinalysis to exclude acute on chronic UTI. 5. There is mild mucosal hyperattenuation of the urinary bladder without significant perivesical fat stranding. Correlate clinically and with urinalysis to exclude chronic versus acute cystitis. 6. Multiple other nonacute observations, as described above. Aortic Atherosclerosis (ICD10-I70.0). Electronically Signed   By: Ree Molt M.D.   On: 09/04/2023 14:56    ASSESSMENT/PLAN:  Assessment: Principal Problem:   Lower urinary tract infectious disease Active Problems:   Altered mental status   Elevated LFTs   Urinary tract infection   Dementia without behavioral disturbance, psychotic disturbance, mood disturbance, or anxiety (HCC)   Plan:   #SIRS:    Pulse rate 101 bpm.WBC increased to 19.6 from 13. Patient had an episode of fever last night; received Tylenol .  Abdominopelvic CT findings showed CBD stent migration and mets sigmoid colon neoplasm  Plan:  -Continue ceftriaxone  1g IV daily. -Monitor vital signs and fever episodes. -Review blood and urine culture results when available to guide antibiotic therapy.    #Urinary Tract Infection   E. coli bacteremia and urinary tract infection diagnosed on 4/25. Patient denies dysuria or suprapubic pain; however, limited cooperation during physical exam prevented thorough suprapubic assessment. No CVA tenderness noted on exam. Despite ongoing treatment with ceftriaxone , patient continues to have episodes of fever, raising concern for a complicated UTI. Abd/pelvic CT:  Bilateral urethral without significant periprostatic fat  stranding.  Mild mucosal hyperattenuation of the urinary bladder.   Plan: -Continue ceftriaxone  1g IV daily in 200 mL (currently Day 4 of therapy). -Obtain abdominal and pelvic CT scan with and without contrast to evaluate for complicated UTI. -Monitor patient's clinical status and fever pattern closely. -Reassess antibiotic regimen based on imaging and clinical response.  #Elevated Liver Enzymes #CBD stent migration Patient has slightly elevated AST and ALT of 52 and 67 respectively.  No known history of pre-existing liver disease. Likely due to retained stent, imaging showing dilation of hepatic ducts    Abd/pelvic CT: inferior migration of previously seen CBD stent with lower half of the stent now with drainage in the duodenum resulting in mild to moderate intrahepatic and extrahepatic bile duct dilation.  gallbladder surgery in 2015, maybe the stent was placed.  -GI consulted -Discussed case with Dr. Wilhelmenia our biliary specialist. He recommends an ERCP with removal of stent as well as possible intraoperative cholangiogram  -hold lovenox  today for possible procedure   #  potential metastasis of sigmoid colon  Positive hx of stage 3 ductal carcinoma, incidental finding in abd/prlvic ct: sigmoid colon abnormality with questionable neoplasm and potential metastasis to the lung  SMOG enema yesterday given retained fecal material/fecaloma seen on imaging.   -May need to repeat SMOG enema in order to clear the rectum for possible flex sig. - Oncology consult -Recommend flexible sigmoidoscopy by GI(Attempt to schedule for 7/30) afternoon   Altered mental status:   Daughter reports patient was at baseline on Saturday but worsened Sunday. Due to no provider availability at [facility], patient was brought to the ED. Daughter now notes significant improvement. Today, patient shows improvement from yesterday and recalls seeing her daughter and staff.  -Avoid polypharmacy -Being treated for  UTI with ceftriaxone  1g IV daily, Day 3  #Mild hypokalemia K:3.4 Start k tablet   #IDC, stage IIIB -Continue Arimidex  1 mg daily   #History of CVA - No focal deficit and sign of new stroke -Continue 81 mg aspirin  daily   #Constipation -Continue Dulcolax 10 mg as needed -Continue Senna -Continue MiraLAX    #GERD -Continue famotidine  20 mg daily   #Epilepsy Patient has history of seizures including during last admission in April 2025.  Neurology was consulted and determined likely source is from hypoglycemia.  No concern for seizure-like activity at this time. -Continue Depakote  250 mg twice daily  Best Practice: Diet: Regular diet IVF: None VTE: enoxaparin  (LOVENOX ) injection 30 mg Start: 09/06/23 1000 Code: DNR  Disposition planning: Therapy Recs: SNF,  Family Contact: Daughter, called and notified. DISPO: Anticipated discharge in 2 days to Skilled nursing facility pending clinical improvement.  Signature:  Kegan Mckeithan [facility]  5:49 PM, 09/04/2023  On Call pager [phone]

## 2023-09-05 DIAGNOSIS — K639 Disease of intestine, unspecified: Secondary | ICD-10-CM

## 2023-09-05 LAB — CBC
HCT: 37.9 % (ref 36.0–46.0)
Hemoglobin: 11.6 g/dL — ABNORMAL LOW (ref 12.0–15.0)
MCH: 27.2 pg (ref 26.0–34.0)
MCHC: 30.6 g/dL (ref 30.0–36.0)
MCV: 89 fL (ref 80.0–100.0)
Platelets: 407 K/uL — ABNORMAL HIGH (ref 150–400)
RBC: 4.26 MIL/uL (ref 3.87–5.11)
RDW: 21.5 % — ABNORMAL HIGH (ref 11.5–15.5)
WBC: 16.3 K/uL — ABNORMAL HIGH (ref 4.0–10.5)
nRBC: 0 % (ref 0.0–0.2)

## 2023-09-05 LAB — BASIC METABOLIC PANEL WITH GFR
Anion gap: 10 (ref 5–15)
BUN: 9 mg/dL (ref 8–23)
CO2: 24 mmol/L (ref 22–32)
Calcium: 9 mg/dL (ref 8.9–10.3)
Chloride: 109 mmol/L (ref 98–111)
Creatinine, Ser: 0.61 mg/dL (ref 0.44–1.00)
GFR, Estimated: >60 mL/min (ref >60)
Glucose, Bld: 113 mg/dL — ABNORMAL HIGH (ref 70–99)
Potassium: 4.2 mmol/L (ref 3.5–5.1)
Sodium: 143 mmol/L (ref 135–145)

## 2023-09-05 LAB — MAGNESIUM: Magnesium: 2 mg/dL (ref 1.7–2.4)

## 2023-09-05 LAB — GLUCOSE, CAPILLARY
Glucose-Capillary: 121 mg/dL — ABNORMAL HIGH (ref 70–99)
Glucose-Capillary: 119 mg/dL — ABNORMAL HIGH (ref 70–99)
Glucose-Capillary: 110 mg/dL — ABNORMAL HIGH (ref 70–99)
Glucose-Capillary: 156 mg/dL — ABNORMAL HIGH (ref 70–99)
Glucose-Capillary: 101 mg/dL — ABNORMAL HIGH (ref 70–99)

## 2023-09-05 LAB — GAMMA GT: GGT: 206 U/L — ABNORMAL HIGH (ref 7–50)

## 2023-09-05 MED ORDER — SMOG ENEMA
960.0000 mL | Freq: Once | RECTAL | Status: AC
  Administered 2023-09-05: 960 mL via RECTAL
  Filled 2023-09-05: qty 960

## 2023-09-05 MED ORDER — SMOG ENEMA: 400.0000 mL | Freq: Once | RECTAL | Status: DC

## 2023-09-05 NOTE — Progress Notes (Signed)
 Patient ID: Carolyn Parrish, female   DOB: August 29, 1940, 83 y.o.   MRN: [id number]    Progress Note   Subjective   Day # 3 CC; abnormal CT abd/pelvis, fecal impaction, sigmoid lesion  Ceftriaxone   Pt is non conversant, awake  Urine Culture + GNR CT abdomen and pelvis yesterday-moderate extrahepatic biliary ductal dilation measuring up to 1.5 centimeters new since prior study inferior migration of the previously seen common bile duct stent lower half of the stent now protruding into the duodenum gallbladder absent, multiple scattered diverticuli throughout the left hemicolon without diverticulitis moderate to large amount of stool with large fecaloma in the rectum up to 9.6 cm, approximately 5 cm long segment of proximal sigmoid colon with irregular heterogeneous wall thickening as well as an area of extension of the soft tissue beyond the serosa consistent with neoplastic process, nonobstructing, 3 solid noncalcified nodules bilateral lower lobes the largest is 1.6 x 1.8 cm  Nurse reports that patient did have very good results with the smog enema, stooling all night and still stooling this morning  CEA pending WBC improved at 16.3/hemoglobin 11.6    Objective   Vital signs in last 24 hours: Temp:  [98 F (36.7 C)-98.4 F (36.9 C)] 98.1 F (36.7 C) (07/30 0814) Pulse Rate:  [85-110] 85 (07/30 0814) Resp:  [14-18] 18 (07/30 0007) BP: (92-125)/(52-85) 95/52 (07/30 0814) SpO2:  [97 %-100 %] 97 % (07/30 0814) Last BM Date : 09/05/23 General:    Elderly African-American female n NAD Heart:  Regular rate and rhythm; no murmurs Lungs: Respirations even and unlabored, lungs CTA bilaterally Abdomen:  Soft, nontender and nondistended. Normal bowel sounds. Extremities:  Without edema. Neurologic:  Alert , opens eyes off and on, no response to conversation pulls away to exam Psych:  Cooperative. Normal mood and affect. Rectal exam, liquid stool around the perianal area, no fecal impaction  and no solid stool palpable, lots of liquid stool no heme  Intake/Output from previous day: 07/29 0701 - 07/30 0700 In: 1000 [IV Piggyback:1000] Out: 1725 [Urine:1725] Intake/Output this shift: No intake/output data recorded.  Lab Results: Recent Labs    09/03/23 0413 09/04/23 0422 09/05/23 0238  WBC 13.0* 19.6* 16.3*  HGB 10.3* 10.9* 11.6*  HCT 33.5* 34.6* 37.9  PLT 316 360 407*   BMET Recent Labs    09/03/23 0413 09/04/23 0422 09/05/23 0238  NA 140 139 143  K 3.7 3.4* 4.2  CL 106 106 109  CO2 21* 23 24  GLUCOSE 95 117* 113*  BUN 13 12 9   CREATININE 0.59 0.64 0.61  CALCIUM  8.9 8.9 9.0   LFT Recent Labs    09/03/23 0412 09/04/23 0422  PROT 6.9 7.0  ALBUMIN  2.4* 2.5*  AST 51* 51*  ALT 62* 56*  ALKPHOS 251* 342*  BILITOT 1.1 1.0  BILIDIR 0.3*  --   IBILI 0.8  --    PT/INR Recent Labs    09/02/23 1823  LABPROT 14.2  INR 1.0    Studies/Results: CT CHEST W CONTRAST Result Date: 09/04/2023 CLINICAL DATA:  Colon cancer metastases.  * Tracking Code: BO *. EXAM: CT CHEST WITH CONTRAST TECHNIQUE: Multidetector CT imaging of the chest was performed during intravenous contrast administration. RADIATION DOSE REDUCTION: This exam was performed according to the departmental dose-optimization program which includes automated exposure control, adjustment of the mA and/or kV according to patient size and/or use of iterative reconstruction technique. CONTRAST:  50mL OMNIPAQUE  IOHEXOL  350 MG/ML SOLN COMPARISON:  Abdomen pelvis CT  09/04/2023 and older. FINDINGS: Cardiovascular: Thoracic aorta is normal course and caliber with some mild scattered atherosclerotic plaque. Heart nonenlarged. Small pericardial effusion versus pericardial thickening. Coronary artery calcifications are seen. Prominent epicardial fat. Mediastinum/Nodes: Small complex right-sided thyroid cystic focus measuring 9 mm. No specific imaging follow-up at this time. Patulous esophagus with some luminal fluid.  No specific abnormal lymph node enlargement identified in the axillary regions or hila. Lymph node anterior to the right side of the carina has dimension of 11 by 11 mm on series 3, image 54, borderline. Is also borderline subcarinal node on the right side with short axis of 10 mm on series 3, image 63. Lungs/Pleura: Bandlike changes along the lung bases, scar or atelectasis. Trace bilateral pleural fluid. Surgical changes from left mastectomy. Also along the anterior left hemithorax there is some pleural thickening with interstitial septal thickening. Possibly scarring and fibrotic change. Please correlate for any history of radiation or other process. There is a dominant nodule in the right lower lobe abutting the pleura posteriorly and medial on image 86 measuring 19 by 14 mm. Smaller focus more laterally in the right lower lobe measures 6 mm on image 88. Small nodule left lower lobe on image 71 measuring 3 mm. Evaluation limited by breathing motion. Upper Abdomen: Adrenal glands are preserved. Biliary ductal dilatation identified within endoscopic stent in place. Please correlate with prior CT scan the abdomen pelvis. Musculoskeletal: Osteopenia. Diffuse degenerative changes. There are areas of fusion along the posterior elements in the upper thoracic spine. IMPRESSION: Bilateral lower lobe lung nodules identified with the largest measuring 19 x 14 mm. In the setting of colon mass these could be metastatic lesions. Tiny pleural effusions. Few borderline mediastinal nodes, indeterminate. Attention on follow-up. Previous left mastectomy. There is some left upper lobe pleural thickening and presumed scarring/fibrotic changes. Please see separate dictation of abdomen and pelvis CT. Aortic Atherosclerosis (ICD10-I70.0). Electronically Signed   By: Ranell Bring M.D.   On: 09/04/2023 19:15   CT ABDOMEN PELVIS W WO CONTRAST Result Date: 09/04/2023 CLINICAL DATA:  UTI, recurrent/complicated (Female). * Tracking Code:  BO * EXAM: CT ABDOMEN AND PELVIS WITHOUT AND WITH CONTRAST TECHNIQUE: Multidetector CT imaging of the abdomen and pelvis was performed following the standard protocol before and following the bolus administration of intravenous contrast. RADIATION DOSE REDUCTION: This exam was performed according to the departmental dose-optimization program which includes automated exposure control, adjustment of the mA and/or kV according to patient size and/or use of iterative reconstruction technique. CONTRAST:  75mL OMNIPAQUE  IOHEXOL  350 MG/ML SOLN COMPARISON:  CT scan abdomen and pelvis from 01/08/2018. FINDINGS: Lower chest: There are at least 3 solid noncalcified nodules in the bilateral lower lobes with largest in the right lung lower lobe abutting the pleura measuring 1.6 x 1.8 cm. These are highly concerning for metastases. There are atelectatic changes in the bilateral lower lobes. No pleural effusion. Normal cardiac size. No pericardial effusion. Hepatobiliary: The liver is normal in size. Non-cirrhotic configuration. No suspicious mass. There is moderate extrahepatic bile duct dilation, measuring up to 1.5 cm in diameter, new since the prior study. There is inferior migration of previously seen CBD stent with lower half of the stent now protruding within the duodenum. There is also resultant mild to moderate intrahepatic bile duct dilation. Gallbladder is surgically absent. Pancreas: Unremarkable. No pancreatic ductal dilatation or surrounding inflammatory changes. Spleen: Normal-sized spleen. There is a subcapsular subcentimeter sized hypoattenuating focus along the posteromedial aspect, which is too small to adequately characterize.  Adrenals/Urinary Tract: Adrenal glands are unremarkable. No suspicious renal mass. Redemonstration of bilateral extrarenal pelvis. There is bilateral urothelial thickening however, without significant perivesical fat stranding. Findings favor sequela of chronic urinary tract infection.  However, correlate clinically and with urinalysis to exclude acute on chronic UTI. No nephroureterolithiasis on either side. Urinary bladder is decompressed secondary to a Foley catheter. There is mild mucosal hyperattenuation of the urinary bladder without significant perivesical fat stranding. Correlate clinically and with urinalysis to exclude chronic versus acute cystitis. Stomach/Bowel: No disproportionate dilation of the small or large bowel loops. Unremarkable appendix. There are multiple scattered diverticula mainly throughout the left hemicolon without diverticulitis. There is moderate-to-large amount of stool with large fecaloma in the rectum which is distended up to 9.6 cm in diameter. 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 However, the lesion is not obstructing. Vascular/Lymphatic: No ascites or pneumoperitoneum. No abdominal or pelvic lymphadenopathy, by size criteria. No aneurysmal dilation of the major abdominal arteries. There are moderate peripheral atherosclerotic vascular calcifications of the aorta and its major branches. Reproductive: The uterus is unremarkable. No large adnexal mass. Other: The visualized soft tissues and abdominal wall are unremarkable. Musculoskeletal: No suspicious osseous lesions. There are mild - moderate multilevel degenerative changes in the visualized spine. IMPRESSION: 1. 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 However, the lesion is not obstructing. 2. There are at least 3 solid noncalcified nodules in the bilateral lower lobes with largest in the right lung  lower lobe abutting the pleura measuring 1.6 x 1.8 cm. These are highly concerning for metastases. 3. There is inferior migration of previously seen CBD stent with lower half of the stent now protruding within the duodenum. There is resultant mild to moderate intrahepatic and extrahepatic bile duct dilation. 4. There is bilateral urothelial thickening however, without significant perivesical fat stranding. Findings favor sequela of chronic urinary tract infection. However, correlate clinically and with urinalysis to exclude acute on chronic UTI. 5. There is mild mucosal hyperattenuation of the urinary bladder without significant perivesical fat stranding. Correlate clinically and with urinalysis to exclude chronic versus acute cystitis. 6. Multiple other nonacute observations, as described above. Aortic Atherosclerosis (ICD10-I70.0). Electronically Signed   By: Ree Molt M.D.   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  #2 fecal impaction,-good results with smog enema, no impaction on exam today, will continue with smog enemas for further purge  #3 altered mental status-no change overnight #4 retained biliary stent-probably placed 2015 #5 history of CVA with residual deficit #6 elevated LFTs-that is post remote cholecystectomy, chronically dilated common bile duct with old biliary stent in place-unclear if elevated LFTs related to this or other  process  Plan; will give another smog  enema today, and an additional enema in a.m. Patient has been scheduled for flexible sigmoidoscopy and ERCP with removal of the biliary stent tomorrow afternoon 3 PM/Dr. Wilhelmenia .  Procedure was discussed in detail with the patient's daughter by phone Carolyn Failing, PA-C), indications risks and benefits discussed and she is agreeable to proceed.  Continue to trend LFTs, check GGT  Agree that goals of care discussion needs to be had, perhaps best to wait to see if malignancy found at flexible sigmoidoscopy.     Principal Problem:   Lower urinary tract infectious disease Active Problems:   Altered mental status   Elevated LFTs   Urinary tract infection   Dementia without behavioral disturbance, psychotic disturbance, mood disturbance, or anxiety (HCC)   Lesion of colon     LOS: 2 days   Dyllon Henken PA-C 09/05/2023, 11:33 AM

## 2023-09-05 NOTE — Progress Notes (Signed)
 [facility] Brief Progress Note/Update  Patient's ERCP and flex sigmoidoscopy are scheduled for tomorrow 09/06/2023 with Dr. Wilhelmenia at 3 PM at [facility].  I have discussed plan with the patient's daughter over the phone today at 9:40 am,who agrees and will sign consent form when she comes to visit her mother around noon/1:00 today.  I have also let the nursing staff know that she is coming in to make sure this consent gets signed today.  We did review risk, benefits, limitations and alternatives over the phone and the patient's daughter agrees to proceed. We discussed that Dr. Wilhelmenia will be doing these procedures for her mom.  Also discussed with nursing staff that given fecaloma seen on imaging, we will proceed with another SMOG enema later this afternoon as well as 2 tapwater enemas prior to flex sig tomorrow.  Patient is producing stool per them.  Diet has been changed to a clear liquid today and will be n.p.o. after midnight.  Delon Failing, PA-C

## 2023-09-05 NOTE — Plan of Care (Signed)

## 2023-09-05 NOTE — Progress Notes (Incomplete)
 HD#2 SUBJECTIVE:  Patient Summary:  Carolyn Parrish is a 83 y.o. 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     Overnight Events: N/A  Interim History: The patient appeared calm and was sleeping. She was not visually tracking staff and was not speaking or consistently following commands. However, she did demonstrate some response by following simple commands such as opening her mouth.  OBJECTIVE:  Vital Signs: Vitals:   09/05/23 0007 09/05/23 0814 09/05/23 1210 09/05/23 1602  BP: 125/85 (!) 95/52 120/73 114/69  Pulse: (!) 110 85 79 91  Resp: 18   16  Temp: 98 F (36.7 C) 98.1 F (36.7 C) 98.4 F (36.9 C) 98.3 F (36.8 C)  TempSrc: Axillary Axillary Axillary Axillary  SpO2: 98% 97% 99% 98%  Weight:      Height:       Supplemental O2: Room Air SpO2: 98 %  Filed Weights   09/02/23 1815  Weight: 65 kg     Intake/Output Summary (Last 24 hours) at 09/05/2023 1758 Last data filed at 09/05/2023 0300 Gross per 24 hour  Intake 1000 ml  Output 825 ml  Net 175 ml   Net IO Since Admission: -1,017.18 mL [09/05/23 1758]  Physical Exam: Physical Exam  Cardiac: Palpitations  Lung: Clear Abd: Soft with no tenderness or guarding  Neurologic: Not conversant, following command, Eyes shut in response to light stimulus  Patient Lines/Drains/Airways Status     Active Line/Drains/Airways     Name Placement date Placement time Site Days   Peripheral IV 09/02/23 20 G Right Hand 09/02/23  1824  Hand  3   Peripheral IV 09/02/23 18 G Left Forearm 09/02/23  1825  Forearm  3   Urethral Catheter J kantlehner RN Latex 14 Fr. 09/02/23  1903  Latex  3            Pertinent labs and imaging:      Latest Ref Rng & Units 09/05/2023    2:38 AM 09/04/2023    4:22 AM 09/03/2023    4:13 AM  CBC  WBC 4.0 - 10.5  K/uL 16.3  19.6  13.0   Hemoglobin 12.0 - 15.0 g/dL 88.3  89.0  89.6   Hematocrit 36.0 - 46.0 % 37.9  34.6  33.5   Platelets 150 - 400 K/uL 407  360  316        Latest Ref Rng & Units 09/05/2023    2:38 AM 09/04/2023    4:22 AM 09/03/2023    4:13 AM  CMP  Glucose 70 - 99 mg/dL 886  882  95   BUN 8 - 23 mg/dL 9  12  13    Creatinine 0.44 - 1.00 mg/dL 9.38  9.35  9.40   Sodium 135 - 145 mmol/L 143  139  140   Potassium 3.5 - 5.1 mmol/L 4.2  3.4  3.7   Chloride 98 - 111 mmol/L 109  106  106   CO2 22 - 32 mmol/L 24  23  21    Calcium  8.9 - 10.3 mg/dL 9.0  8.9  8.9   Total Protein 6.5 - 8.1 g/dL  7.0    Total Bilirubin 0.0 - 1.2 mg/dL  1.0    Alkaline Phos 38 - 126 U/L  342    AST 15 - 41 U/L  51    ALT 0 - 44 U/L  56      CT CHEST W CONTRAST Result Date: 09/04/2023 CLINICAL DATA:  Colon cancer metastases.  * Tracking Code: BO *. EXAM: CT CHEST WITH CONTRAST TECHNIQUE: Multidetector CT imaging of the chest was performed during intravenous contrast administration. RADIATION DOSE REDUCTION: This exam was performed according to the departmental dose-optimization program which includes automated exposure control, adjustment of the mA and/or kV according to patient size and/or use of iterative reconstruction technique. CONTRAST:  50mL OMNIPAQUE  IOHEXOL  350 MG/ML SOLN COMPARISON:  Abdomen pelvis CT 09/04/2023 and older. FINDINGS: Cardiovascular: Thoracic aorta is normal course and caliber with some mild scattered atherosclerotic plaque. Heart nonenlarged. Small pericardial effusion versus pericardial thickening. Coronary artery calcifications are seen. Prominent epicardial fat. Mediastinum/Nodes: Small complex right-sided thyroid cystic focus measuring 9 mm. No specific imaging follow-up at this time. Patulous esophagus with some luminal fluid. No specific abnormal lymph node enlargement identified in the axillary regions or hila. Lymph node anterior to the right side of the carina has dimension of 11 by  11 mm on series 3, image 54, borderline. Is also borderline subcarinal node on the right side with short axis of 10 mm on series 3, image 63. Lungs/Pleura: Bandlike changes along the lung bases, scar or atelectasis. Trace bilateral pleural fluid. Surgical changes from left mastectomy. Also along the anterior left hemithorax there is some pleural thickening with interstitial septal thickening. Possibly scarring and fibrotic change. Please correlate for any history of radiation or other process. There is a dominant nodule in the right lower lobe abutting the pleura posteriorly and medial on image 86 measuring 19 by 14 mm. Smaller focus more laterally in the right lower lobe measures 6 mm on image 88. Small nodule left lower lobe on image 71 measuring 3 mm. Evaluation limited by breathing motion. Upper Abdomen: Adrenal glands are preserved. Biliary ductal dilatation identified within endoscopic stent in place. Please correlate with prior CT scan the abdomen pelvis. Musculoskeletal: Osteopenia. Diffuse degenerative changes. There are areas of fusion along the posterior elements in the upper thoracic spine. IMPRESSION: Bilateral lower lobe lung nodules identified with the largest measuring 19 x 14 mm. In the setting of colon mass these could be metastatic lesions. Tiny pleural effusions. Few borderline mediastinal nodes, indeterminate. Attention on follow-up. Previous left mastectomy. There is some left upper lobe pleural thickening and presumed scarring/fibrotic changes. Please see separate dictation of abdomen and pelvis CT. Aortic Atherosclerosis (ICD10-I70.0). Electronically Signed   By: Ranell Bring M.D.   On: 09/04/2023 19:15    ASSESSMENT/PLAN:  Assessment: Principal Problem:   Lower urinary tract infectious disease Active Problems:   Altered mental status   Elevated LFTs   Urinary tract infection   Dementia without behavioral disturbance, psychotic disturbance, mood disturbance, or anxiety (HCC)    Lesion of colon   Plan:    Altered mental status:   Patient has demonstrated fluctuating mental status. She was reportedly at her baseline on Saturday but experienced worsening symptoms on Sunday. By Tuesday, her daughter observed significant improvement. The patient was noted to be more alert, showed improvement compared to the prior day, and was able to recall interactions with her daughter and staff. On Wednesday (7/30), the patient exhibited altered mental status overnight, with episodes of agitation, failure to follow commands, and a brief hypotensive event. On morning examination, the patient was nonverbal and did not respond to commands. Today: She was awake, not tracking with her eyes, following commands,  not conversant Plan:  -Continue to monitor mental status closely. -SLP evaluation -Avoid polypharmacy -TSh -Treating UTI with ceftriaxone  1g IV daily (Day 5 of treatment). -Maintenance IV fluids LR 100 mL/hr. -EEG to R/O subclinical seizures    #Leukocytosis: WBC trend: 23.5 ? 16 ? 19 ? 13. Urine culture positive for gram-negative rods and Proteus. Continue ceftriaxone  1g IV daily in 200 mL (currently Day 5). Continue monitoring blood cultures.    #Urinary Tract Infection BP 124/68, HR 83, afebrile. WBC 23.6 (up from 16.7). Urine culture growing E. coli and Proteus; blood cultures negative to date. Family declined invasive procedures including ERCP and expressed concern about altered mental status. E. coli bacteremia and urinary tract infection diagnosed on 4/25. Patient was not cooperative due to AMS. Abd/pelvic CT:  Bilateral urethral without significant periprostatic fat stranding.  Mild mucosal hyperattenuation of the urinary bladder.  Because it does put her at higher risk for stroke or UC: Gram neg rod and Proteus  Plan:  -abdominal and pelvic CT scan showed no perinephric abscess  -Maintain IV fluids to support hemodynamic stability.    #Elevated Liver  Enzymes #CBD stent migration Patient has slightly elevated AST and ALT of 52 and 67 respectively.  No known history of pre-existing liver disease. Likely due to retained stent, imaging showing dilation of hepatic ducts    Abd/pelvic CT: inferior migration of previously seen CBD stent with lower half of the stent now with drainage in the duodenum resulting in mild to moderate intrahepatic and extrahepatic bile duct dilation.  gallbladder surgery in 2015, maybe the stent was placed.   -GI consulted - discussed with family, they are not agreed with invasive procedures. - cancel ERCP    # potential metastasis of sigmoid colon   Positive hx of stage 3 ductal carcinoma, incidental finding in abd/prlvic ct: sigmoid colon abnormality with questionable neoplasm and potential metastasis to the lung  -Discussed with family, they are not agreed with invasive procedures.    #Mild hypokalemia Resolved, K: 3.7      #IDC, stage IIIB -Continue Arimidex  1 mg daily   #History of CVA - No focal deficit and sign of new stroke -Continue 81 mg aspirin  daily   #Constipation -Continue Dulcolax 10 mg as needed -Continue Senna -Continue MiraLAX    #GERD -Continue famotidine  20 mg daily   #Epilepsy Patient has history of seizures including during last admission in April 2025.  Neurology was consulted and determined likely source is from hypoglycemia.  No concern for seizure-like activity at this time. -Continue Depakote  250 mg twice daily   Best Practice: Diet: Clear fluid IVF: None VTE: enoxaparin  (LOVENOX ) injection 30 mg Start: 09/06/23 1000, held due to flex sig and ERCP, compression stocking Code: DNR   Disposition planning: Therapy Recs: SNF,  Family Contact: Daughter, called and notified. DISPO: Anticipated discharge in 2 days to Skilled nursing facility pending clinical improvement.   Signature:  Ishmel Acevedo [facility]  5:58 PM, 09/05/2023  On Call  pager [phone]

## 2023-09-06 ENCOUNTER — Encounter (HOSPITAL_COMMUNITY)
Admission: EM | Disposition: A | Discharge status: Skilled Nursing Facility | Payer: Self-pay | Source: Skilled Nursing Facility | Attending: Infectious Diseases

## 2023-09-06 ENCOUNTER — Inpatient Hospital Stay (HOSPITAL_COMMUNITY)

## 2023-09-06 DIAGNOSIS — Z515 Encounter for palliative care: Secondary | ICD-10-CM

## 2023-09-06 DIAGNOSIS — K639 Disease of intestine, unspecified: Secondary | ICD-10-CM

## 2023-09-06 DIAGNOSIS — Z7189 Other specified counseling: Secondary | ICD-10-CM

## 2023-09-06 DIAGNOSIS — R7989 Other specified abnormal findings of blood chemistry: Secondary | ICD-10-CM | POA: Diagnosis not present

## 2023-09-06 DIAGNOSIS — N390 Urinary tract infection, site not specified: Secondary | ICD-10-CM

## 2023-09-06 DIAGNOSIS — F0390 Unspecified dementia without behavioral disturbance: Secondary | ICD-10-CM

## 2023-09-06 DIAGNOSIS — A419 Sepsis, unspecified organism: Secondary | ICD-10-CM

## 2023-09-06 LAB — BASIC METABOLIC PANEL WITH GFR
Anion gap: 11 (ref 5–15)
BUN: 12 mg/dL (ref 8–23)
CO2: 24 mmol/L (ref 22–32)
Calcium: 8.4 mg/dL — ABNORMAL LOW (ref 8.9–10.3)
Chloride: 107 mmol/L (ref 98–111)
Creatinine, Ser: 0.57 mg/dL (ref 0.44–1.00)
GFR, Estimated: >60 mL/min (ref >60)
Glucose, Bld: 102 mg/dL — ABNORMAL HIGH (ref 70–99)
Potassium: 3.7 mmol/L (ref 3.5–5.1)
Sodium: 142 mmol/L (ref 135–145)

## 2023-09-06 LAB — HEPATIC FUNCTION PANEL
ALT: 38 U/L (ref 0–44)
AST: 27 U/L (ref 15–41)
Albumin: 2.2 g/dL — ABNORMAL LOW (ref 3.5–5.0)
Alkaline Phosphatase: 217 U/L — ABNORMAL HIGH (ref 38–126)
Bilirubin, Direct: 0.3 mg/dL — ABNORMAL HIGH (ref 0.0–0.2)
Indirect Bilirubin: 0.7 mg/dL (ref 0.3–0.9)
Total Bilirubin: 1 mg/dL (ref 0.0–1.2)
Total Protein: 6.4 g/dL — ABNORMAL LOW (ref 6.5–8.1)

## 2023-09-06 LAB — CBC WITH DIFFERENTIAL/PLATELET
Basophils Absolute: 0 K/uL (ref 0.0–0.1)
Basophils Relative: 0 %
Eosinophils Absolute: 0 K/uL (ref 0.0–0.5)
Eosinophils Relative: 0 %
HCT: 37.7 % (ref 36.0–46.0)
Hemoglobin: 11.5 g/dL — ABNORMAL LOW (ref 12.0–15.0)
Lymphocytes Relative: 7 %
Lymphs Abs: 1.6 K/uL (ref 0.7–4.0)
MCH: 27.4 pg (ref 26.0–34.0)
MCHC: 30.5 g/dL (ref 30.0–36.0)
MCV: 90 fL (ref 80.0–100.0)
Monocytes Absolute: 4.9 K/uL — ABNORMAL HIGH (ref 0.1–1.0)
Monocytes Relative: 21 %
Neutro Abs: 16.9 K/uL — ABNORMAL HIGH (ref 1.7–7.7)
Neutrophils Relative %: 72 %
Platelets: 422 K/uL — ABNORMAL HIGH (ref 150–400)
RBC: 4.19 MIL/uL (ref 3.87–5.11)
RDW: 21.6 % — ABNORMAL HIGH (ref 11.5–15.5)
WBC: 23.5 K/uL — ABNORMAL HIGH (ref 4.0–10.5)
nRBC: 0 % (ref 0.0–0.2)

## 2023-09-06 LAB — CEA: CEA: 6.1 ng/mL — ABNORMAL HIGH (ref 0.0–4.7)

## 2023-09-06 LAB — GLUCOSE, CAPILLARY
Glucose-Capillary: 100 mg/dL — ABNORMAL HIGH (ref 70–99)
Glucose-Capillary: 109 mg/dL — ABNORMAL HIGH (ref 70–99)
Glucose-Capillary: 85 mg/dL (ref 70–99)
Glucose-Capillary: 90 mg/dL (ref 70–99)

## 2023-09-06 LAB — TSH: TSH: 3.881 u[IU]/mL (ref 0.350–4.500)

## 2023-09-06 LAB — PATHOLOGIST SMEAR REVIEW

## 2023-09-06 SURGERY — ERCP, WITH INTERVENTION IF INDICATED
Anesthesia: General

## 2023-09-06 MED ORDER — LACTATED RINGERS IV SOLN: INTRAVENOUS | Status: AC

## 2023-09-06 NOTE — Progress Notes (Signed)
 Goals of care conversation with daughter.  Called the daughter to go over goals of care.  Went over clinical course and discussion with daughter about clinical status at this time.  Patient remains somnolent, not able to interact with staff well.  Is not eating and drinking well.  The big question was if patient was able to interact, which she would be willing to go through all of this workup to evaluate what these masses are.  Per daughter, the patient would not want to go through any invasive procedures, and would like nature to take its course.  However, daughter does want us  to workup her altered mental status to see if this could be reversible.  In summary, daughter states not to pursue ERCP or flex sig.  Would like nature to take its course.  Will evaluate further for altered mental status.  Will continue current treatment, but not escalate further in terms of intubation, CPR, or invasive procedures.  GI Notified and palliative notified.

## 2023-09-06 NOTE — Progress Notes (Signed)
 EEG complete - results pending

## 2023-09-06 NOTE — Progress Notes (Signed)
 I spoke with the patient's daughter today.  We discussed how it was very unlikely that the patient's mental status changes were related to the suspected mass seen in her colon, or the partially dislodged biliary stent noted on the CT. It is extremely unlikely that either the flexible sigmoidoscopy or the ERCP would do anything to improve the patient's mental status, and would actually have the potential to worsen things.  We agreed that it was in the patient's best interest to hold off on these invasive procedures now.  I did offer that if the patient's mental status improved, and it was her wish to proceed with these procedures, then we would be available.  We also did discuss that even if a colon malignancy were identified on the sigmoidoscopy, it seems unlikely that the patient would be a good candidate for either surgical or medical treatment.  GI will sign off at this time.  Please reconsult as needed.

## 2023-09-06 NOTE — Plan of Care (Signed)

## 2023-09-06 NOTE — Plan of Care (Signed)

## 2023-09-06 NOTE — Evaluation (Signed)
 Clinical/Bedside Swallow Evaluation Patient Details  Name: Carolyn Parrish MRN: [id number] Date of Birth: 03/02/1940  Today's Date: 09/06/2023 Time: SLP Start Time (ACUTE ONLY): 1231 SLP Stop Time (ACUTE ONLY): 1240 SLP Time Calculation (min) (ACUTE ONLY): 9 min  Past Medical History:  Past Medical History:  Diagnosis Date   Cancer (HCC)    BREAST CANCER   CHF (congestive heart failure) (HCC)    DM (diabetes mellitus), secondary, with peripheral vascular complications (HCC)    Hyperlipidemia    Hypertension    Schizoaffective disorder (HCC)    Stroke [facility])    Past Surgical History:  Past Surgical History:  Procedure Laterality Date   CHOLECYSTECTOMY     2016   MASTECTOMY COMPLETE / SIMPLE Left 10/30/2017   SIMPLE MASTECTOMY WITH AXILLARY SENTINEL NODE BIOPSY Left 10/30/2017   Procedure: LEFT SIMPLE MASTECTOMY;  Surgeon: Vanderbilt Ned, MD;  Location: [facility] OR;  Service: General;  Laterality: Left;   HPI:  Carolyn Parrish is an 83 yo female with PMH of CVA with residual deficits, breast cancer s/p XRT and mastectomy, HTN, CHF. presenting to ED 7/27 with AMS and possible UTI. CT Abdomen/Pelvis shows probable sigmoid neoplasm and fecal impaction. CT Chest shows bilateral lower lobe lung nodules, which in the setting of colon mass these could be metastatic lesions. MBS 06/02/23 showed fairly consistent penetration with thin (PAS 2) and nectar thick liquids (PAS 3) but more transient penetration with purees and honey thick liquids. Since she was unable to be challenged with large volumes, SLP recommended Dys 1 solids with honey thick liquids before she resumed Dys 2 solids with thin liquids, which is consistent with her baseline s/p CVA.    Assessment / Plan / Recommendation  Clinical Impression  Pt is known to this SLP from recent admission, during which she underwent an MBS and was ultimately recommended to continue her baseline diet of Dys 2 solids with thin liquids. She was awake and  responding to questions with dysarthric speech, which appears consistent with baseline given history of CVA. RN reports concerns this admission with audible swallowing and coughing with liquids, which was observed today at the time of my evaluation. She is impulsive and takes large sips, which may contribute to presentation. Pt was attentive to solids, masticating thoroughly and clearing her oral cavity of Dys 2 solids. Could consider an MBS but based on chart review, pt's daughter is primarily interested in w/u for AMS and does not want to pursue any invasive procedures.   Returned to pt's room later in the day upon arrival of her daughter, Carolyn Parrish, to discuss goals. She states that she would not wish for her mother's diet to be modified any further regardless of what instrumental testing may reveal. She states the pt typically uses a rate-control cup, which reduces instances of coughing. Encouraged Carolyn Parrish to bring her cup in as able and provide thorough oral care. Recommend continuing current diet without ongoing SLP f/u.  SLP Visit Diagnosis: Dysphagia, unspecified (R13.10)    Aspiration Risk  Mild aspiration risk    Diet Recommendation Dysphagia 2 (Fine chop);Thin liquid    Liquid Administration via: Cup Medication Administration: Crushed with puree Supervision: Staff to assist with self feeding;Full supervision/cueing for compensatory strategies Compensations: Minimize environmental distractions;Slow rate;Small sips/bites Postural Changes: Seated upright at 90 degrees    Other  Recommendations Oral Care Recommendations: Oral care BID     Assistance Recommended at Discharge    Functional Status Assessment Patient has not had a recent  decline in their functional status  Frequency and Duration            Prognosis Prognosis for improved oropharyngeal function: Fair Barriers to Reach Goals: Cognitive deficits;Language deficits;Time post onset      Swallow Study   General HPI:  [name] is an 83 yo female with PMH of CVA with residual deficits, breast cancer s/p XRT and mastectomy, HTN, CHF. presenting to ED 7/27 with AMS and possible UTI. CT Abdomen/Pelvis shows probable sigmoid neoplasm and fecal impaction. CT Chest shows bilateral lower lobe lung nodules, which in the setting of colon mass these could be metastatic lesions. MBS 06/02/23 showed fairly consistent penetration with thin (PAS 2) and nectar thick liquids (PAS 3) but more transient penetration with purees and honey thick liquids. Since she was unable to be challenged with large volumes, SLP recommended Dys 1 solids with honey thick liquids before she resumed Dys 2 solids with thin liquids, which is consistent with her baseline s/p CVA. Type of Study: Bedside Swallow Evaluation Previous Swallow Assessment: see HPI Diet Prior to this Study: Dysphagia 2 (finely chopped);Thin liquids (Level 0) Temperature Spikes Noted: No Respiratory Status: Room air History of Recent Intubation: No Behavior/Cognition: Alert;Cooperative Oral Cavity Assessment: Within Functional Limits Oral Care Completed by SLP: No Oral Cavity - Dentition: Dentures, top Vision: Functional for self-feeding Self-Feeding Abilities: Total assist Patient Positioning: Upright in bed Baseline Vocal Quality: Normal Volitional Cough: Strong Volitional Swallow: Able to elicit    Oral/Motor/Sensory Function Overall Oral Motor/Sensory Function: Within functional limits   Ice Chips Ice chips: Not tested   Thin Liquid Thin Liquid: Impaired Presentation: Straw Pharyngeal  Phase Impairments: Cough - Immediate    Nectar Thick Nectar Thick Liquid: Not tested   Honey Thick Honey Thick Liquid: Not tested   Puree Puree: Not tested   Solid     Solid: Within functional limits Presentation: Spoon      Damien Blumenthal, M.A., CCC-SLP Speech Language Pathology, Acute Rehabilitation Services  Secure Chat preferred [phone]  09/06/2023,2:43  PM

## 2023-09-06 NOTE — Consult Note (Signed)
 Consultation Note Date: 09/06/2023   Patient Name: Carolyn Parrish  DOB: 1940-08-20  MRN: [id number]  Age / Sex: 83 y.o., female  PCP: Pcp, No Referring Physician: Eben Reyes BROCKS, MD  Reason for Consultation: Establishing goals of care  HPI/Patient Profile: 83 y.o. 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   Patient presented from SNF with dysuria and positive SIRS criteria. In the setting of indwelling urinary catheter the source of her infection is likely urinary tract. UA with microscopy positive for bacteria and white blood cells.  She was also found to have elevated liver enzymes.  She was improving with antibiotics however, had brief hypotensive event and more signs of altered mental status the night of 7/30.  Of note, she has retained CBD stent.  GI was consulted with option for ERCP and flex sig.  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    PMT has been consulted to assist with goals of care conversation.  Clinical Assessment and Goals of Care:  I have reviewed medical records including EPIC notes, labs and imaging, assessed the patient and then met at the bedside with patient's daughter to discuss diagnosis prognosis, GOC, EOL wishes, disposition and options.  I introduced Palliative Medicine as specialized medical care for people living with serious illness. It focuses on providing relief from the symptoms and stress of a serious illness. The goal is to improve quality of  life for both the patient and the family.  We discussed a brief life review of the patient and then focused on their current illness.   I attempted to elicit values and goals of care important to the patient.    Medical History Review and Understanding:  We discussed patient's acute illness in the context of their chronic comorbidities.  Patient's daughter understands the severity of patient's illness.  Social History: Patient has 2 daughters. She resides at [facility].  She enjoys getting to know people and having small conversations at baseline.  Functional and Nutritional State: Patient is bedbound.  Albumin  of 2.2.  Advance Directives: Patient has durable POA documentation on file, no healthcare power of attorney.  Code Status: Concepts specific to code status, artifical feeding and hydration, and rehospitalization were considered and discussed.   Discussion: Patient's daughter is relieved that she is more alert today and able to participate in more conversations.  She confirms that after discussions with Dr. Tobie and Dr. Stacia today, she has decided against aggressive and invasive interventions that will not assist with patient's overall comfort and mental status.  She has been seen by outpatient palliative care, though this has not been much support.  We discussed the difference between palliative care and hospice, reviewing that hospice would be able to provide more support if consistent with goals of care and possibly avoid further hospitalizations/suffering.  She is also seen by [facility] NP a who was typically been able to provide additional support and avoid hospitalizations as well.  We discussed upcoming plan for EEG and other noninvasive testing.  She remains agreeable to this and appreciative of the  support.   The difference between aggressive medical intervention and comfort care was considered in light of the patient's goals of care. Hospice and Palliative Care services  outpatient were explained and offered.   Discussed the importance of continued conversation with family and the medical providers regarding overall plan of care and treatment options, ensuring decisions are within the context of the patient's values and GOCs.   Questions and concerns were addressed.  Hard Choices booklet left for review. The family was encouraged to call with questions or concerns.  PMT will continue to support holistically.   SUMMARY OF RECOMMENDATIONS   - Continue DNR/DNI - Continue gentle medical interventions and treating the treatable.  No aggressive/invasive procedures - Psychosocial and emotional support provided - PMT will continue to follow and support   Prognosis:  Unable to determine  Discharge Planning: Skilled Nursing Facility for rehab with Palliative care service follow-up      Primary Diagnoses: Present on Admission:  Lower urinary tract infectious disease  Altered mental status  Urinary tract infection    Physical Exam Vitals and nursing note reviewed.  Constitutional:      General: She is not in acute distress. Cardiovascular:     Rate and Rhythm: Normal rate.  Pulmonary:     Effort: Pulmonary effort is normal.  Skin:    General: Skin is warm and dry.  Neurological:     Mental Status: She is disoriented.  Psychiatric:        Behavior: Behavior normal.        Cognition and Memory: Cognition is impaired.     Vital Signs: BP 124/68 (BP Location: Right Leg)   Pulse 83   Temp 98.6 F (37 C)   Resp 17   Ht 5' 6 (1.676 m)   Wt 65 kg   SpO2 100%   BMI 23.13 kg/m  Pain Scale: 0-10   Pain Score: 0-No pain   SpO2: SpO2: 100 % O2 Device:SpO2: 100 % O2 Flow Rate: .    Palliative Assessment/Data: 30%      Nataliah Hatlestad P Rahma Meller, PA-C  Palliative Medicine Team Team phone # [phone]  Thank you for allowing the Palliative Medicine Team to assist in the care of this patient. Please utilize secure chat with additional  questions, if there is no response within 30 minutes please call the above phone number.  Palliative Medicine Team providers are available by phone from 7am to 7pm daily and can be reached through the team cell phone.  Should this patient require assistance outside of these hours, please call the patient's attending physician.

## 2023-09-06 NOTE — Procedures (Signed)
 Patient Name: Carolyn Parrish  MRN: [id number]  Epilepsy Attending: Pastor Falling  Referring Physician/Provider: No ref. provider found      Date: 09/06/2023 Duration: 25 minutes   Patient history: 83 y.o. 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 EEG to rule out seizures.   Level of alertness: Awake, drowsy, sleep  AEDs during EEG study: None   Technical aspects: This EEG study was done with scalp electrodes positioned according to the 10-20 International system of electrode placement. Electrical activity was reviewed with band pass filter of 1-70Hz , sensitivity of 7 uV/mm, display speed of 43mm/sec with a 60Hz  notched filter applied as appropriate. EEG data were recorded continuously and digitally stored.  Video monitoring was available and reviewed as appropriate.  Description: The posterior dominant rhythm consists of 9 Hz activity of moderate voltage (25-35 uV) seen predominantly in posterior head regions, symmetric and reactive to eye opening and eye closing. Drowsiness was characterized by attenuation of the posterior background rhythm. Sleep was characterized by vertex waves, sleep spindles (12 to 14 Hz), maximal frontocentral region. Hyperventilation and photic stimulation were not performed.     ABNORMALITY -None   IMPRESSION: This study is within normal limits. No seizures or epileptiform discharges were seen throughout the recording. A normal interictal EEG does not exclude nor support the diagnosis of epilepsy.   Pastor Falling MD Neurology

## 2023-09-07 DIAGNOSIS — A419 Sepsis, unspecified organism: Secondary | ICD-10-CM | POA: Diagnosis not present

## 2023-09-07 DIAGNOSIS — N390 Urinary tract infection, site not specified: Secondary | ICD-10-CM | POA: Diagnosis not present

## 2023-09-07 DIAGNOSIS — Z7189 Other specified counseling: Secondary | ICD-10-CM | POA: Diagnosis not present

## 2023-09-07 DIAGNOSIS — Z515 Encounter for palliative care: Secondary | ICD-10-CM | POA: Diagnosis not present

## 2023-09-07 LAB — CULTURE, BLOOD (ROUTINE X 2)
Culture: NO GROWTH
Culture: NO GROWTH

## 2023-09-07 LAB — GLUCOSE, CAPILLARY
Glucose-Capillary: 71 mg/dL (ref 70–99)
Glucose-Capillary: 78 mg/dL (ref 70–99)
Glucose-Capillary: 97 mg/dL (ref 70–99)
Glucose-Capillary: 99 mg/dL (ref 70–99)
Glucose-Capillary: 134 mg/dL — ABNORMAL HIGH (ref 70–99)
Glucose-Capillary: 137 mg/dL — ABNORMAL HIGH (ref 70–99)

## 2023-09-07 LAB — CBC
HCT: 32.1 % — ABNORMAL LOW (ref 36.0–46.0)
Hemoglobin: 10 g/dL — ABNORMAL LOW (ref 12.0–15.0)
MCH: 28 pg (ref 26.0–34.0)
MCHC: 31.2 g/dL (ref 30.0–36.0)
MCV: 89.9 fL (ref 80.0–100.0)
Platelets: 329 K/uL (ref 150–400)
RBC: 3.57 MIL/uL — ABNORMAL LOW (ref 3.87–5.11)
RDW: 21.3 % — ABNORMAL HIGH (ref 11.5–15.5)
WBC: 20.3 K/uL — ABNORMAL HIGH (ref 4.0–10.5)
nRBC: 0 % (ref 0.0–0.2)

## 2023-09-07 LAB — URINE CULTURE: Culture: >=100000 — AB

## 2023-09-07 LAB — BASIC METABOLIC PANEL WITH GFR
Anion gap: 10 (ref 5–15)
BUN: 12 mg/dL (ref 8–23)
CO2: 23 mmol/L (ref 22–32)
Calcium: 8.1 mg/dL — ABNORMAL LOW (ref 8.9–10.3)
Chloride: 104 mmol/L (ref 98–111)
Creatinine, Ser: 0.63 mg/dL (ref 0.44–1.00)
GFR, Estimated: >60 mL/min (ref >60)
Glucose, Bld: 87 mg/dL (ref 70–99)
Potassium: 3.8 mmol/L (ref 3.5–5.1)
Sodium: 137 mmol/L (ref 135–145)

## 2023-09-07 NOTE — Plan of Care (Signed)

## 2023-09-07 NOTE — Progress Notes (Signed)
 Daily Progress Note   Patient Name: Carolyn Parrish       Date: 09/07/2023 DOB: 05/22/1940  Age: 83 y.o. MRN#: [id number] Attending Physician: Eben Reyes BROCKS, MD Primary Care Physician: Pcp, No Admit Date: 09/02/2023  Reason for Consultation/Follow-up: Establishing goals of care  Subjective: Medical records reviewed including progress notes, labs and imaging. Patient assessed at the bedside.  No signs of pain or respiratory distress.  Discussed with RN.  No family present during the visit.  Attempted to call patient's daughter for ongoing palliative support but was unable to reach.  Left voicemail with PMT contact information.  Length of Stay: 4   Physical Exam Vitals and nursing note reviewed.  Constitutional:      General: She is not in acute distress.    Appearance: She is ill-appearing.  HENT:     Head: Normocephalic.  Cardiovascular:     Rate and Rhythm: Normal rate.  Pulmonary:     Effort: Pulmonary effort is normal.  Neurological:     Mental Status: She is alert.  Psychiatric:        Behavior: Behavior normal.             Vital Signs: BP (!) 92/46 (BP Location: Right Arm)   Pulse 75   Temp 97.7 F (36.5 C)   Resp 17   Ht 5' 6 (1.676 m)   Wt 65 kg   SpO2 100%   BMI 23.13 kg/m  SpO2: SpO2: 100 % O2 Device: O2 Device: Room Air O2 Flow Rate:        Palliative Assessment/Data: 30%   Palliative Care Assessment & Plan   Patient Profile: 83 y.o. 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    Patient presented from SNF with dysuria and positive SIRS criteria. In the setting of indwelling urinary  catheter the source of her infection is likely urinary tract. UA with microscopy positive for bacteria and white blood cells.  She was also found to have elevated liver enzymes.  She was improving with antibiotics however, had brief hypotensive event and more signs of altered mental status the night of 7/30.   Of note, she has retained CBD stent.  GI was consulted with option for ERCP and flex sig.  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     PMT has been consulted to assist with goals of care conversation.  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  No invasive or aggressive procedures and diagnostics Patient previously followed by outpatient palliative care.  Would benefit from continued follow-up once she returns to LTC PMT remains available as needed  Prognosis: Poor long-term prognosis  Discharge Planning: SNF    Total time: I spent 25 minutes in the care of the patient today in the above activities and documenting the encounter.         Loray Akard P Kavya Haag, PA-C  Palliative Medicine Team Team phone # [phone]  Thank you for allowing the Palliative Medicine Team to assist in the care of this patient. Please utilize secure chat with additional questions, if there is no response within 30 minutes please call the above phone number.  Palliative Medicine Team providers are available by phone from 7am to 7pm daily and can be reached through the team cell phone.  Should this patient require assistance outside of  these hours, please call the patient's attending physician.

## 2023-09-07 NOTE — Progress Notes (Signed)
 HD#4 SUBJECTIVE:  Patient Summary: Carolyn Parrish is a 83 y.o. 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  Overnight Events: N/A  Interim History:  Patient is following commands, tracking with eyes, answering questions appropriately, and oriented to time and place, showing significant improvement compared to yesterday.  OBJECTIVE:  Vital Signs: Vitals:   09/07/23 0100 09/07/23 0500 09/07/23 0759 09/07/23 1209  BP: (!) 92/51 92/61 (!) 92/46 (!) 101/46  Pulse: 79 76 75 80  Resp:      Temp: 97.7 F (36.5 C) 97.8 F (36.6 C) 97.7 F (36.5 C)   TempSrc:      SpO2: 100% 100% 100% 100%  Weight:      Height:       Supplemental O2: Room Air SpO2: 100 %  Filed Weights   09/02/23 1815  Weight: 65 kg    No intake or output data in the 24 hours ending 09/07/23 1523 Net IO Since Admission: -1,017.18 mL [09/07/23 1523]  Physical Exam: Physical Exam Cardiac: Palpitations  Lung: Clear Abd: Soft with no tenderness or guarding  Neurologic: Patient is following commands, tracking with eyes, answering questions appropriately, and oriented to time and place, showing significant improvement compared to yesterday. Patient Lines/Drains/Airways Status     Active Line/Drains/Airways     Name Placement date Placement time Site Days   Peripheral IV 09/02/23 18 G Left Forearm 09/02/23  1825  Forearm  5   Urethral Catheter J kantlehner RN Latex 14 Fr. 09/02/23  1903  Latex  5            Pertinent labs and imaging:      Latest Ref Rng & Units 09/07/2023    2:36 AM 09/06/2023    2:18 AM 09/05/2023    2:38 AM  CBC  WBC 4.0 - 10.5 K/uL 20.3  23.5  16.3   Hemoglobin 12.0 - 15.0 g/dL 89.9  88.4  88.3   Hematocrit 36.0 - 46.0 % 32.1  37.7  37.9   Platelets 150 - 400 K/uL 329  422  407        Latest Ref Rng &  Units 09/07/2023    2:36 AM 09/06/2023    2:18 AM 09/05/2023    2:38 AM  CMP  Glucose 70 - 99 mg/dL 87  897  886   BUN 8 - 23 mg/dL 12  12  9    Creatinine 0.44 - 1.00 mg/dL 9.36  9.42  9.38   Sodium 135 - 145 mmol/L 137  142  143   Potassium 3.5 - 5.1 mmol/L 3.8  3.7  4.2   Chloride 98 - 111 mmol/L 104  107  109   CO2 22 - 32 mmol/L 23  24  24    Calcium  8.9 - 10.3 mg/dL 8.1  8.4  9.0   Total Protein 6.5 - 8.1 g/dL  6.4    Total Bilirubin 0.0 - 1.2 mg/dL  1.0    Alkaline Phos 38 - 126 U/L  217    AST 15 - 41 U/L  27    ALT 0 - 44 U/L  38      EEG adult Result Date: 09/06/2023 Gregg Lek, MD     09/06/2023  5:28 PM Patient Name: Carolyn Parrish MRN: [id number] Epilepsy Attending: Lek Gregg Referring Physician/Provider: No ref.  provider found     Date: 09/06/2023 Duration: 25 minutes Patient history: 83 y.o. 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 EEG to rule out seizures. Level of alertness: Awake, drowsy, sleep AEDs during EEG study: None Technical aspects: This EEG study was done with scalp electrodes positioned according to the 10-20 International system of electrode placement. Electrical activity was reviewed with band pass filter of 1-70Hz , sensitivity of 7 uV/mm, display speed of 71mm/sec with a 60Hz  notched filter applied as appropriate. EEG data were recorded continuously and digitally stored.  Video monitoring was available and reviewed as appropriate. Description: The posterior dominant rhythm consists of 9 Hz activity of moderate voltage (25-35 uV) seen predominantly in posterior head regions, symmetric and reactive to eye opening and eye closing. Drowsiness was characterized by attenuation of the posterior background rhythm. Sleep was characterized by vertex waves, sleep spindles (12 to 14 Hz), maximal  frontocentral region. Hyperventilation and photic stimulation were not performed.   ABNORMALITY -None IMPRESSION: This study is within normal limits. No seizures or epileptiform discharges were seen throughout the recording. A normal interictal EEG does not exclude nor support the diagnosis of epilepsy. Carolyn Falling MD Neurology     ASSESSMENT/PLAN:  Assessment: Principal Problem:   Lower urinary tract infectious disease Active Problems:   Altered mental status   Elevated LFTs   Urinary tract infection   Dementia without behavioral disturbance, psychotic disturbance, mood disturbance, or anxiety (HCC)   Lesion of colon   Plan:  Altered mental status: Patient has demonstrated fluctuating mental status.  TSH and electrolytes were normal; EEG showed no subclinical seizures. Urine culture grew Proteus and E. coli, sensitive to ceftriaxone , patient is being treated. Blood cultures were negative. Workup for altered mental status is unrevealing so far; concern remains for possible brain metastasis or stroke. Patient was at baseline Saturday, worsened Sunday, and showed significant improvement by Tuesday and then, 7/30: the patient exhibited altered mental status overnight, with episodes of agitation, failure to follow commands, and a brief hypotensive event. On morning examination, the patient was nonverbal and did not respond to commands. 7/31:She was awake, not tracking with her eyes, following commands, not conversant 8/1: Patient is following commands, tracking with eyes, answering questions appropriately, and oriented to time and place, showing significant improvement compared to yesterday. -Brain MRI  -Continue to monitor mental status closely -Avoid polypharmacy -Treating UTI with ceftriaxone  1g IV daily (Day 6 of treatment).   #Leukocytosis: WBC trend: 23.5 ? 20.3 Urine culture positive for gram-negative rods and Proteus. Continue ceftriaxone  1g IV daily in 200 mL (currently Day  6). B/C was negative  #Urinary Tract Infection Urine culture growing E. coli and Proteus sensitive to CTX E. coli bacteremia and urinary tract infection diagnosed on 4/25. Abd/pelvic CT:  Bilateral urethral without significant periprostatic fat stranding.  Mild mucosal hyperattenuation of the urinary bladder.   -CTX 1 gr daily -Maintain IV fluids to support hemodynamic stability.   #Elevated Liver Enzymes #CBD stent migration Patient has slightly elevated AST and ALT of 52 and 67 respectively.  No known history of pre-existing liver disease. Likely due to retained stent, imaging showing dilation of hepatic ducts    Abd/pelvic CT: inferior migration of previously seen CBD stent with lower half of the stent now with drainage in the duodenum resulting in mild to moderate  intrahepatic and extrahepatic bile duct dilation.  gallbladder surgery in 2015, maybe the stent was placed. -GI consulted - family are not agreed with invasive procedures like ERCP   # potential metastasis of sigmoid colon  Positive hx of stage 3 ductal carcinoma, incidental finding in abd/prlvic ct: sigmoid colon abnormality with questionable neoplasm and potential metastasis to the lung  Family are not agreed with invasive procedures.   #Mild hypokalemia Resolved, K: 3.8   #IDC, stage IIIB -Continue Arimidex  1 mg daily   #History of CVA - No focal deficit and sign of new stroke -Continue 81 mg aspirin  daily   #Constipation -Continue Dulcolax 10 mg as needed -Continue Senna -Continue MiraLAX    #GERD -Continue famotidine  20 mg daily   #Epilepsy Patient has history of seizures including during last admission in April 2025 source is from hypoglycemia.  EEG has done for subclinical seizure, no concern for seizure-like activity at this time. -Continue Depakote  250 mg twice daily   Best Practice: Diet: Clear fluid IVF: None VTE: enoxaparin  (LOVENOX ) injection 30 mg Start: 09/06/23 1000, held due to flex sig  and ERCP, compression stocking Code: DNR   Disposition planning: Therapy Recs: SNF,  Family Contact: Daughter, called and notified. DISPO: Anticipated discharge in 2 days to Skilled nursing facility pending clinical improvement.   Signature:  Rayneisha Bouza [facility]  3:23 PM, 09/07/2023  On Call pager [phone]

## 2023-09-08 LAB — CBC WITH DIFFERENTIAL/PLATELET
Abs Immature Granulocytes: 0.23 K/uL — ABNORMAL HIGH (ref 0.00–0.07)
Basophils Absolute: 0.1 K/uL (ref 0.0–0.1)
Basophils Relative: 0 %
Eosinophils Absolute: 0.2 K/uL (ref 0.0–0.5)
Eosinophils Relative: 1 %
HCT: 31.7 % — ABNORMAL LOW (ref 36.0–46.0)
Hemoglobin: 9.8 g/dL — ABNORMAL LOW (ref 12.0–15.0)
Immature Granulocytes: 1 %
Lymphocytes Relative: 19 %
Lymphs Abs: 3.6 K/uL (ref 0.7–4.0)
MCH: 27.7 pg (ref 26.0–34.0)
MCHC: 30.9 g/dL (ref 30.0–36.0)
MCV: 89.5 fL (ref 80.0–100.0)
Monocytes Absolute: 1.9 K/uL — ABNORMAL HIGH (ref 0.1–1.0)
Monocytes Relative: 10 %
Neutro Abs: 13.3 K/uL — ABNORMAL HIGH (ref 1.7–7.7)
Neutrophils Relative %: 69 %
Platelets: 379 K/uL (ref 150–400)
RBC: 3.54 MIL/uL — ABNORMAL LOW (ref 3.87–5.11)
RDW: 20.9 % — ABNORMAL HIGH (ref 11.5–15.5)
WBC: 19.3 K/uL — ABNORMAL HIGH (ref 4.0–10.5)
nRBC: 0 % (ref 0.0–0.2)

## 2023-09-08 LAB — COMPREHENSIVE METABOLIC PANEL WITH GFR
ALT: 24 U/L (ref 0–44)
AST: 22 U/L (ref 15–41)
Albumin: 2 g/dL — ABNORMAL LOW (ref 3.5–5.0)
Alkaline Phosphatase: 145 U/L — ABNORMAL HIGH (ref 38–126)
Anion gap: 9 (ref 5–15)
BUN: 10 mg/dL (ref 8–23)
CO2: 24 mmol/L (ref 22–32)
Calcium: 8.2 mg/dL — ABNORMAL LOW (ref 8.9–10.3)
Chloride: 104 mmol/L (ref 98–111)
Creatinine, Ser: 0.41 mg/dL — ABNORMAL LOW (ref 0.44–1.00)
GFR, Estimated: >60 mL/min (ref >60)
Glucose, Bld: 103 mg/dL — ABNORMAL HIGH (ref 70–99)
Potassium: 3.2 mmol/L — ABNORMAL LOW (ref 3.5–5.1)
Sodium: 137 mmol/L (ref 135–145)
Total Bilirubin: 0.3 mg/dL (ref 0.0–1.2)
Total Protein: 5.8 g/dL — ABNORMAL LOW (ref 6.5–8.1)

## 2023-09-08 LAB — GLUCOSE, CAPILLARY
Glucose-Capillary: 91 mg/dL (ref 70–99)
Glucose-Capillary: 93 mg/dL (ref 70–99)
Glucose-Capillary: 109 mg/dL — ABNORMAL HIGH (ref 70–99)

## 2023-09-08 LAB — MAGNESIUM: Magnesium: 1.9 mg/dL (ref 1.7–2.4)

## 2023-09-08 MED ORDER — POTASSIUM CHLORIDE CRYS ER 20 MEQ PO TBCR
40.0000 meq | EXTENDED_RELEASE_TABLET | Freq: Two times a day (BID) | ORAL | Status: AC
  Administered 2023-09-08 (×2): 40 meq via ORAL
  Filled 2023-09-08 (×2): qty 2

## 2023-09-08 MED ORDER — ENOXAPARIN SODIUM 40 MG/0.4ML IJ SOSY
40.0000 mg | PREFILLED_SYRINGE | INTRAMUSCULAR | Status: DC
  Administered 2023-09-08 – 2023-09-09 (×2): 40 mg via SUBCUTANEOUS
  Filled 2023-09-08 (×2): qty 0.4

## 2023-09-08 NOTE — Plan of Care (Signed)

## 2023-09-08 NOTE — Plan of Care (Signed)
  Problem: Education: Goal: Knowledge of General Education information will improve Description: Including pain rating scale, medication(s)/side effects and non-pharmacologic comfort measures 09/08/2023 0420 by Neda Rosaline LABOR, RN Outcome: Progressing 09/08/2023 0419 by Neda Rosaline LABOR, RN Outcome: Progressing   Problem: Health Behavior/Discharge Planning: Goal: Ability to manage health-related needs will improve 09/08/2023 0420 by Neda Rosaline LABOR, RN Outcome: Progressing 09/08/2023 0419 by Neda Rosaline LABOR, RN Outcome: Progressing   Problem: Clinical Measurements: Goal: Ability to maintain clinical measurements within normal limits will improve 09/08/2023 0420 by Neda Rosaline LABOR, RN Outcome: Progressing 09/08/2023 0419 by Neda Rosaline LABOR, RN Outcome: Progressing Goal: Will remain free from infection 09/08/2023 0420 by Neda Rosaline LABOR, RN Outcome: Progressing 09/08/2023 0419 by Neda Rosaline LABOR, RN Outcome: Progressing Goal: Diagnostic test results will improve 09/08/2023 0420 by Neda Rosaline LABOR, RN Outcome: Progressing 09/08/2023 0419 by Neda Rosaline LABOR, RN Outcome: Progressing Goal: Respiratory complications will improve 09/08/2023 0420 by Neda Rosaline LABOR, RN Outcome: Progressing 09/08/2023 0419 by Neda Rosaline LABOR, RN Outcome: Progressing Goal: Cardiovascular complication will be avoided 09/08/2023 0420 by Neda Rosaline LABOR, RN Outcome: Progressing 09/08/2023 0419 by Neda Rosaline LABOR, RN Outcome: Progressing   Problem: Activity: Goal: Risk for activity intolerance will decrease 09/08/2023 0420 by Neda Rosaline LABOR, RN Outcome: Progressing 09/08/2023 0419 by Neda Rosaline LABOR, RN Outcome: Progressing   Problem: Nutrition: Goal: Adequate nutrition will be maintained 09/08/2023 0420 by Neda Rosaline LABOR, RN Outcome: Progressing 09/08/2023 0419 by Neda Rosaline LABOR, RN Outcome: Progressing   Problem: Coping: Goal: Level of anxiety will decrease 09/08/2023 0420 by Neda Rosaline LABOR, RN Outcome: Progressing 09/08/2023 0419 by Neda Rosaline LABOR, RN Outcome: Progressing   Problem: Elimination: Goal: Will not experience complications related to bowel motility 09/08/2023 0420 by Neda Rosaline LABOR, RN Outcome: Progressing 09/08/2023 0419 by Neda Rosaline LABOR, RN Outcome: Progressing Goal: Will not experience complications related to urinary retention 09/08/2023 0420 by Neda Rosaline LABOR, RN Outcome: Progressing 09/08/2023 0419 by Neda Rosaline LABOR, RN Outcome: Progressing   Problem: Pain Managment: Goal: General experience of comfort will improve and/or be controlled 09/08/2023 0420 by Neda Rosaline LABOR, RN Outcome: Progressing 09/08/2023 0419 by Neda Rosaline LABOR, RN Outcome: Progressing   Problem: Safety: Goal: Ability to remain free from injury will improve 09/08/2023 0420 by Neda Rosaline LABOR, RN Outcome: Progressing 09/08/2023 0419 by Neda Rosaline LABOR, RN Outcome: Progressing   Problem: Skin Integrity: Goal: Risk for impaired skin integrity will decrease 09/08/2023 0420 by Neda Rosaline LABOR, RN Outcome: Progressing 09/08/2023 0419 by Neda Rosaline LABOR, RN Outcome: Progressing

## 2023-09-08 NOTE — Evaluation (Addendum)
 Physical Therapy Evaluation & Discharge Patient Details Name: Carolyn Parrish MRN: [id number] DOB: 09-28-1940 Today's Date: 09/08/2023  History of Present Illness  83 y.o. female admitted from LTC SNF on 09/02/23 with AMS. Workup for SIRS, UTI, acute infectious encephalopathy, concern for GI malignancy (family has chosen not to pursue workup). EEG negative for seizures. PMH includes CVA (residual deficits), dementia, breast CA, CHF, DM, HTN, HLD, schizoaffective disorder, seizures, recurrent UTI.   Clinical Impression  Patient evaluated by Physical Therapy with no further acute PT needs identified. PTA, pt LTC resident at [facility] where pt requires hoyer lift for OOB transfers and dependent for bed-level ADLs. Today, pt requiring totalA for bed mobility and repositioning, able to minimally assist by gripping bed rail. Pt pleasant and following simple commands, though frequently tearful with anticipation of mobility. All education has been completed and the patient has no further questions. Recommend return to LTC. Acute PT is signing off. Thank you for this referral.      If plan is discharge home, recommend the following: Two people to help with walking and/or transfers;Two people to help with bathing/dressing/bathroom   Can travel by private vehicle   No    Equipment Recommendations None recommended by PT  Recommendations for Other Services   Palliative Medicine    Functional Status Assessment       Precautions / Restrictions Precautions Precautions: Fall;Other (comment) Recall of Precautions/Restrictions: Impaired Precaution/Restrictions Comments: incontinence      Mobility  Bed Mobility Overal bed mobility: Needs Assistance Bed Mobility: Rolling Rolling: Total assist         General bed mobility comments: assist to reach L hand to bed rail then pt able to maintain grip but not pull, totalA for LE management and trunk rotation with rolling; pt tearful, which she  attributes to feeling scared. rolled 2x to R-side with encouragement, dependent pericare/hygiene    Transfers                   General transfer comment: NT - will require hoyer lift for OOB transfers    Ambulation/Gait                  Stairs            Wheelchair Mobility     Tilt Bed    Modified Rankin (Stroke Patients Only)       Balance                                             Pertinent Vitals/Pain Pain Assessment Pain Assessment: Faces Faces Pain Scale: Hurts a little bit Pain Location: pt initially unable to localize, when asked about specific body parts she says yes to pain everywhere (including back, arms, legs, neck) Pain Descriptors / Indicators: Discomfort Pain Intervention(s): Monitored during session, Repositioned    Home Living Family/patient expects to be discharged to:: Skilled nursing facility                   Additional Comments: per chart, LTC resident at [facility]    Prior Function Prior Level of Function : Needs assist             Mobility Comments: pt reports she does not get OOB much at [facility]; hoyer lift dependent; can partially assist with rolling ADLs Comments: requires assist from staff for bed-level ADLs, assist  to eat     Extremity/Trunk Assessment   Upper Extremity Assessment Upper Extremity Assessment: RUE deficits/detail;LUE deficits/detail RUE Deficits / Details: gross strength </ 2/5 lacking full AROM and difficulty holding partial shoulder flex/abd against gravity; noted tremulous BUEs with all movement; able to maintain L hand grip on bed rail with turning LUE Deficits / Details: gross strength </ 2/5 lacking full AROM and difficulty holding partial shoulder flex/abd against gravity; noted tremulous BUEs with all movement; able to maintain L hand grip on bed rail with turning    Lower Extremity Assessment Lower Extremity Assessment: RLE deficits/detail;LLE  deficits/detail RLE Deficits / Details: gross BLE strength </ 2/5 with bilateral knees lacking full extension LLE Deficits / Details: gross BLE strength </ 2/5 with bilateral knees lacking full extension       Communication   Communication Communication: Impaired Factors Affecting Communication: Reduced clarity of speech;Difficulty expressing self    Cognition Arousal: Alert Behavior During Therapy: Flat affect   PT - Cognitive impairments: History of cognitive impairments                       PT - Cognition Comments: h/o dementia, CVA. pt pleasant and cooperative, frequently becomes tearful with mobility initiation, endorses she is scared, responds well to encouragement; follows single step commands as able with weakness Following commands: Intact       Cueing Cueing Techniques: Verbal cues     General Comments General comments (skin integrity, edema, etc.): pt with some bowel incontinence requiring totalA for pericare/hygiene. repositioned to encourage pressure relief between knees and trunk elevated in modified chair position.    Exercises     Assessment/Plan    PT Assessment Patient does not need any further PT services  PT Problem List         PT Treatment Interventions      PT Goals (Current goals can be found in the Care Plan section)  Acute Rehab PT Goals PT Goal Formulation: All assessment and education complete, DC therapy    Frequency       Co-evaluation               AM-PAC PT 6 Clicks Mobility  Outcome Measure Help needed turning from your back to your side while in a flat bed without using bedrails?: Total Help needed moving from lying on your back to sitting on the side of a flat bed without using bedrails?: Total Help needed moving to and from a bed to a chair (including a wheelchair)?: Total Help needed standing up from a chair using your arms (e.g., wheelchair or bedside chair)?: Total Help needed to walk in hospital room?:  Total Help needed climbing 3-5 steps with a railing? : Total 6 Click Score: 6    End of Session   Activity Tolerance: Patient tolerated treatment well Patient left: in bed;with call bell/phone within reach;with bed alarm set Nurse Communication: Mobility status;Need for lift equipment PT Visit Diagnosis: Other abnormalities of gait and mobility (R26.89);Muscle weakness (generalized) (M62.81)    Time: 8669-8654 PT Time Calculation (min) (ACUTE ONLY): 15 min   Charges:   PT Evaluation $PT Eval Low Complexity: 1 Low   PT General Charges $$ ACUTE PT VISIT: 1 Visit       Darice Almas, PT, DPT Acute Rehabilitation Services  Personal: Secure Chat Rehab Office: [phone]  Darice LITTIE Almas 09/08/2023, 2:48 PM

## 2023-09-08 NOTE — Progress Notes (Signed)
 HD#5 Subjective:   Summary: This is a 83 year old female with past medical history of invasive ductal breast carcinoma, previous CVA, diabetes, hypertension who presented with concerns of altered mental status and admitted for UTI.  Through hospital course, patient found to have concern for GI malignancy.  Altered mental status waxing and waning and still being worked up.  Overnight Events: No acute events overnight  Patient evaluated bedside this morning.  She reports that she has been eating well.  She had eggs this morning.  She has no complaints this morning.  She states she is feeling well.  Objective:  Vital signs in last 24 hours: Vitals:   09/07/23 1611 09/07/23 2018 09/08/23 0045 09/08/23 0500  BP: (!) 108/50 (!) 108/57 (!) 98/46 (!) 101/48  Pulse: 93 94 89 91  Resp:      Temp: 98.4 F (36.9 C) (!) 97.5 F (36.4 C) 99.8 F (37.7 C) 98.8 F (37.1 C)  TempSrc: Oral Oral Oral Oral  SpO2: 100% 100% 100% 99%  Weight:      Height:       Supplemental O2: Room Air SpO2: 99 %   Physical Exam:  Constitutional: Frail-appearing. Neck: supple, normal range of motion Cardiovascular: regular rate and rhythm, no m/r/g Pulmonary/Chest: normal work of breathing on room air, lungs clear to auscultation bilaterally Abdominal: soft, non-tender, non-distended Neurological: alert & oriented x 2, 4/5 strength to bilateral upper and lower extremities.  Follows instructions  Filed Weights   09/02/23 1815  Weight: 65 kg     Intake/Output Summary (Last 24 hours) at 09/08/2023 0600 Last data filed at 09/08/2023 0000 Gross per 24 hour  Intake 440 ml  Output 175 ml  Net 265 ml   Net IO Since Admission: -752.18 mL [09/08/23 0600]  Pertinent Labs:    Latest Ref Rng & Units 09/08/2023    2:21 AM 09/07/2023    2:36 AM 09/06/2023    2:18 AM  CBC  WBC 4.0 - 10.5 K/uL 19.3  20.3  23.5   Hemoglobin 12.0 - 15.0 g/dL 9.8  89.9  88.4   Hematocrit 36.0 - 46.0 % 31.7  32.1  37.7   Platelets  150 - 400 K/uL 379  329  422        Latest Ref Rng & Units 09/08/2023    2:21 AM 09/07/2023    2:36 AM 09/06/2023    2:18 AM  CMP  Glucose 70 - 99 mg/dL 896  87  897   BUN 8 - 23 mg/dL 10  12  12    Creatinine 0.44 - 1.00 mg/dL 9.58  9.36  9.42   Sodium 135 - 145 mmol/L 137  137  142   Potassium 3.5 - 5.1 mmol/L 3.2  3.8  3.7   Chloride 98 - 111 mmol/L 104  104  107   CO2 22 - 32 mmol/L 24  23  24    Calcium  8.9 - 10.3 mg/dL 8.2  8.1  8.4   Total Protein 6.5 - 8.1 g/dL 5.8   6.4   Total Bilirubin 0.0 - 1.2 mg/dL 0.3   1.0   Alkaline Phos 38 - 126 U/L 145   217   AST 15 - 41 U/L 22   27   ALT 0 - 44 U/L 24   38     Imaging: No results found.  Assessment/Plan:   Principal Problem:   Lower urinary tract infectious disease Active Problems:   Altered mental status  Elevated LFTs   Urinary tract infection   Dementia without behavioral disturbance, psychotic disturbance, mood disturbance, or anxiety (HCC)   Lesion of colon   Patient Summary: Carolyn Parrish is a 83 y.o. 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  Course complicated by potential GI malignancy.  #Acute infectious encephalopathy, improving Likely secondary to urinary tract infection.  Today is day 6 of ceftriaxone .  Will continue to treat and monitor mental status.  Did evaluate with TSH and EEG to see if patient has any metabolic or underlying subclinical seizures.  No obvious signs of hypothyroidism or subclinical seizures.  Patient is slowly improving day by day.  Examined at bedside today.  Patient is awake and alert, and improving well.  Will cancel MRI as likely this is secondary due to infection and patient is slowly bouncing back. - Delirium precautions - Cancel MRI as patient is improved - Continue ceftriaxone  day 6 - Continue to reorient  patient - Patient has had Foley in place, but needs this, otherwise can remove other lines and tubes if needed - Get patient up and moving with PT/OT  #Urinary tract infection #Leukocytosis Likely causing primary concern.  Will continue to treat with ceftriaxone .  Urine growing Proteus and E. coli both sensitive to ceftriaxone .  Leukocytosis slightly trending down to 19.3.  Smear did not show any obvious concerns for underlying blood malignancy. - Continue ceftriaxone  day 6 - Monitor white count and fever curve - AMS is improving  #GI neoplastic process Imaging showed evidence of potential GI neoplastic process in the sigmoid colon with potential lung mets.  Given frailty and age, daughter has elected to not pursue workup for this at this time.  GI has signed off. - Monitor clinically  #Elevated liver enzymes, resolved #CBD stent migration GI was consulted for potential ERCP, but daughter declined invasive procedure at this time given altered mental status as well as frailty.  Fortunately liver enzymes have back to normal. - Monitor clinically  #Hypokalemia Hypokalemic to 3.2 today.  Will replete.  Check mag. - Follow-up potassium after repletion of 40 mEq of potassium twice daily for 2 doses - Follow-up mag  #History of CVA No acute concerns for focal deficits at this time.  Will evaluate patient with MRI today. - Continue aspirin  81 mg daily  #Constipation - Continue bisacodyl  suppository and Senna as needed  #History of seizures Patient has had recent history of seizures and is on Depakote  250 mg twice daily.  No acute concern for subclinical seizures at this time. - Continue Depakote  250 mg twice daily  Diet: Dysphagia 2 IVF: None,None VTE: Enoxaparin  started 09/08/2023 Code: DNR/DNI PT/OT recs: Pending Family Update: Updated daughter today  Dispo: Anticipated discharge to Skilled nursing facility in 4 days pending clinical improvement.   Libby Blanch DO Internal  Medicine Resident PGY-3 Please contact the on call pager after 5 pm and on weekends at [phone].

## 2023-09-09 LAB — COMPREHENSIVE METABOLIC PANEL WITH GFR
ALT: 22 U/L (ref 0–44)
AST: 25 U/L (ref 15–41)
Albumin: 1.9 g/dL — ABNORMAL LOW (ref 3.5–5.0)
Alkaline Phosphatase: 127 U/L — ABNORMAL HIGH (ref 38–126)
Anion gap: 11 (ref 5–15)
BUN: 6 mg/dL — ABNORMAL LOW (ref 8–23)
CO2: 23 mmol/L (ref 22–32)
Calcium: 8.1 mg/dL — ABNORMAL LOW (ref 8.9–10.3)
Chloride: 105 mmol/L (ref 98–111)
Creatinine, Ser: 0.4 mg/dL — ABNORMAL LOW (ref 0.44–1.00)
GFR, Estimated: >60 mL/min (ref >60)
Glucose, Bld: 91 mg/dL (ref 70–99)
Potassium: 3.9 mmol/L (ref 3.5–5.1)
Sodium: 139 mmol/L (ref 135–145)
Total Bilirubin: 0.5 mg/dL (ref 0.0–1.2)
Total Protein: 5.5 g/dL — ABNORMAL LOW (ref 6.5–8.1)

## 2023-09-09 LAB — GLUCOSE, CAPILLARY
Glucose-Capillary: 93 mg/dL (ref 70–99)
Glucose-Capillary: 127 mg/dL — ABNORMAL HIGH (ref 70–99)

## 2023-09-09 LAB — CBC
HCT: 31.2 % — ABNORMAL LOW (ref 36.0–46.0)
Hemoglobin: 9.5 g/dL — ABNORMAL LOW (ref 12.0–15.0)
MCH: 27.4 pg (ref 26.0–34.0)
MCHC: 30.4 g/dL (ref 30.0–36.0)
MCV: 89.9 fL (ref 80.0–100.0)
Platelets: 368 K/uL (ref 150–400)
RBC: 3.47 MIL/uL — ABNORMAL LOW (ref 3.87–5.11)
RDW: 20.9 % — ABNORMAL HIGH (ref 11.5–15.5)
WBC: 15.2 K/uL — ABNORMAL HIGH (ref 4.0–10.5)
nRBC: 0 % (ref 0.0–0.2)

## 2023-09-09 MED ORDER — CEPHALEXIN 500 MG PO CAPS
500.0000 mg | ORAL_CAPSULE | Freq: Once | ORAL | Status: AC
  Administered 2023-09-09: 500 mg via ORAL
  Filled 2023-09-09: qty 1

## 2023-09-09 MED ORDER — SODIUM CHLORIDE 0.9 % IV SOLN
1.0000 g | INTRAVENOUS | Status: DC
  Filled 2023-09-09: qty 10

## 2023-09-09 NOTE — TOC Transition Note (Addendum)
 Transition of Care [facility]) - Discharge Note   Patient Details  Name: Carolyn Parrish MRN: [id number] Date of Birth: May 28, 1940  Transition of Care [facility]) CM/SW Contact:  Gwenn Julien [place], [place] Phone Number: 09/09/2023, 11:49 AM   Clinical Narrative: Pt for dc back to [facility] where she is a LTC resident. Spoke to [place] in admissions who confirmed they are prepared to admit pt to room 708. Pt's dtr Waldo aware of dc and reports agreeable. RN provided with number for report and [facility] arranged for transport. SW signing off at dc.    Julien Gwenn, MSW, LCSW [phone] (coverage)        Final next level of care: Skilled Nursing Facility Barriers to Discharge: Barriers Resolved   Patient Goals and CMS Choice            Discharge Placement              Patient chooses bed at: [facility] Patient to be transferred to facility by: [facility] Name of family member notified: Vernnessa/dtr Patient and family notified of of transfer: 09/09/23  Discharge Plan and Services Additional resources added to the After Visit Summary for                                       Social Drivers of Health (SDOH) Interventions SDOH Screenings   Food Insecurity: Patient Unable To Answer (09/03/2023)  Housing: Patient Unable To Answer (09/03/2023)  Transportation Needs: Patient Unable To Answer (09/03/2023)  Utilities: Patient Unable To Answer (09/03/2023)  Depression (PHQ2-9): Medium Risk (12/03/2018)  Financial Resource Strain: Low Risk  (01/15/2018)  Physical Activity: Inactive (01/15/2018)  Social Connections: Patient Unable To Answer (09/03/2023)  Stress: Stress Concern Present (01/15/2018)  Tobacco Use: Low Risk  (09/04/2023)     Readmission Risk Interventions    06/12/2023    1:16 PM  Readmission Risk Prevention Plan  Transportation Screening Complete  Palliative Care Screening Not Applicable

## 2023-09-09 NOTE — Progress Notes (Signed)
 Called [facility] to give report and no answer. Will try again.

## 2023-09-09 NOTE — Progress Notes (Signed)
 OT Cancellation Note  Patient Details Name: Carolyn Parrish MRN: [id number] DOB: [birth year]   Cancelled Treatment:    Reason Eval/Treat Not Completed: OT screened, no needs identified, will sign off. Per chart and secure chat with evaluating PT, pt is at her baseline. Recommend return to LTC with assist from staff.   Carolyn Parrish, OTD, OTR/L [facility] Office: [phone]   Carolyn Parrish 09/09/2023, 7:11 AM

## 2023-09-09 NOTE — Plan of Care (Signed)

## 2023-09-09 NOTE — Discharge Instructions (Addendum)
 [name],  It was a pleasure taking care of you at [facility]. You were admitted for urinary tract infection and treated with antibiotics. We are discharging you home now that you are doing better. Please follow the following instructions.   1) Regarding your urinary tract infection, this is cleared.  We have exchanged the Foley. You will not need antibiotics moving forward.   2) Regarding the concern for malignancy in the colon, you can follow-up outpatient with a gastroenterologist if you desire.  Please connect to a gastroenterologist via your primary care physician.  3) If you develop any shortness of breath, chest pains, altered mental status, please return back to the emergency department  4) We have not made any medications changes   Take care,  Dr. Libby Blanch, DO

## 2023-09-09 NOTE — Discharge Summary (Signed)
 Name: Carolyn Parrish MRN: [id number] DOB: 29-Jul-1940 83 y.o. PCP: Pcp, No  Date of Admission: 09/02/2023  6:07 PM Date of Discharge: 09/09/2023 Attending Physician: Dr. Eben  Discharge Diagnosis: Principal Problem:   Lower urinary tract infectious disease Active Problems:   Altered mental status   Elevated LFTs   Urinary tract infection   Dementia without behavioral disturbance, psychotic disturbance, mood disturbance, or anxiety (HCC)   Lesion of colon    Discharge Medications: Allergies as of 09/09/2023       Reactions   Atorvastatin Other (See Comments)   UNSPECIFIED REACTION         Medication List     TAKE these medications    acetaminophen  500 MG tablet Commonly known as: TYLENOL  Take 500 mg by mouth every 4 (four) hours as needed.   anastrozole  1 MG tablet Commonly known as: ARIMIDEX  Take 1 tablet (1 mg total) by mouth daily.   aspirin  EC 81 MG tablet Take 1 tablet (81 mg total) by mouth daily.   bisacodyl  10 MG suppository Commonly known as: DULCOLAX Place 1 suppository (10 mg total) rectally as needed for moderate constipation or severe constipation.   Cholecalciferol 25 MCG (1000 UT) tablet Take 1,000 Units by mouth daily.   divalproex  125 MG capsule Commonly known as: DEPAKOTE  SPRINKLE Take 500 mg by mouth daily. Give 4 capsules by mouth one time a day.   famotidine  20 MG tablet Commonly known as: PEPCID  Take 20 mg by mouth daily.   feeding supplement (PROSource TF20) liquid Place 30 mLs into feeding tube daily.   multivitamin with minerals Tabs tablet Take 1 tablet by mouth daily.   polyethylene glycol 17 g packet Commonly known as: MIRALAX  / GLYCOLAX  Take 17 g by mouth daily.   sennosides-docusate sodium  8.6-50 MG tablet Commonly known as: SENOKOT-S Take 1 tablet by mouth at bedtime.        Disposition and follow-up:   Ms.Carolyn Parrish was discharged from [facility] in Stable condition.  At the  hospital follow up visit please address:  1.  Follow-up:  a.  Urinary tract infection: Treated with 7 days of ceftriaxone .  Cleared up great. We did exchange foley which was likely the nidus for infection.  Mental status improved.  Patient does have a chronic Foley, ensure patient gets routine changes to minimize risk of infection.    b.  Altered mental status: During hospitalization, likely thought to be related to infection.  Improved with treatment.  Ensure patient remains at baseline.  Monitor for centrally acting medications and infections.   c.  Possible GI malignancy: Incidentally found during hospitalization on CT scan.  Possible metastases to lungs.  Daughter elected to not pursue workup given frailty of patient.  If daughter does elect to pursue moving forward, patient can be referred to GI for flex sig and referral to oncology.   d.  Leukocytosis: Downtrending on discharge.  Follow-up outpatient.  2.  Labs / imaging needed at time of follow-up: CBC, CMP  3.  Pending labs/ test needing follow-up: NA  4.  Medication Changes No medications changes made   Follow-up Appointments: Patient to follow-up with should doctor at facility within 1 week for hospital follow-up.  Hospital Course by problem list: Carolyn Parrish is a 83 y.o. 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  Course complicated by potential GI malignancy.   #Acute infectious encephalopathy, resolved Patient presented to the emergency department with concerns of altered mental status.  Thought to be related to acute infection.  Did rule out seizures.  Also ruled out other structural causes via CT scan.  Patient did have waxing and waning cognition during hospitalization likely secondary to delirium.  Patient completed 7-day course of antibiotics.   Mentation came back to baseline.  There was some query about brain mets and MRI was ordered, but with improving mentation and back to baseline, MRI was DC'd.  Patient left hospital in stable condition.   #Urinary tract infection #Leukocytosis Patient presented with altered mental status.  Found to have urinary tract infection.  Cultures grew out Proteus as well as E. coli.  Both susceptible to ceftriaxone .  Patient completed 7-day course.  Patient improved well during hospitalization.  Patient did have Foley exchange during hospitalization.  Ensure patient has routine Foley changes outpatient to mitigate risk of infection.   #GI neoplastic process Imaging findings during hospitalization did find concern for GI malignancy with metastasis to lungs.  GI was consulted, but with goals of care discussion with daughter and medical professionals, the daughter recommended avoiding invasive procedures at this time.  Can follow-up outpatient if daughter would like to.  For now, we will just monitor clinically.   #Elevated liver enzymes, resolved #CBD stent migration GI was consulted for potential ERCP as CT scan did show evidence of stent migration.  During hospitalization, liver enzymes did come back to normal   #Hypokalemia Hospitalization, patient did have hypokalemia likely secondary to poor p.o. intake.  Improved during hospitalization.   #History of CVA No acute focal deficits during hospitalization.  Continued aspirin  81 mg daily during hospitalization.   #Constipation No acute concerns during hospitalization.  Continue bisacodyl  suppository and senna during hospitalization.   #History of seizures No acute concerns during hospitalization.  Did have EEG which ruled out subclinical seizures.  Continue Depakote  during hospitalization.  Discharged with same.   Discharge Subjective: Examined patient this morning.  She is alert and oriented x 3.  She states she is doing well.  She has no concerns.   She states she is ready to go home.  Discharge Exam:   BP (!) 95/48 (BP Location: Right Arm)   Pulse 75   Temp 98 F (36.7 C)   Resp 16   Ht 5' 6 (1.676 m)   Wt 65 kg   SpO2 100%   BMI 23.13 kg/m  Constitutional: Resting in bed, no acute distress HENT: normocephalic atraumatic, mucous membranes moist Cardiovascular: regular rate and rhythm, no m/r/g Pulmonary/Chest: normal work of breathing on room air, lungs clear to auscultation bilaterally GU: Foley in place, draining urine well Neuro: Alert and oriented x 3.  Able to follow all instructions.  No focal neurological deficits appreciated.  Pertinent Labs, Studies, and Procedures:     Latest Ref Rng & Units 09/09/2023    2:13 AM 09/08/2023    2:21 AM 09/07/2023    2:36 AM  CBC  WBC 4.0 - 10.5 K/uL 15.2  19.3  20.3   Hemoglobin 12.0 - 15.0 g/dL 9.5  9.8  89.9   Hematocrit 36.0 - 46.0 % 31.2  31.7  32.1   Platelets 150 - 400 K/uL 368  379  329        Latest Ref Rng & Units 09/09/2023    2:13 AM 09/08/2023    2:21 AM 09/07/2023  2:36 AM  CMP  Glucose 70 - 99 mg/dL 91  896  87   BUN 8 - 23 mg/dL 6  10  12    Creatinine 0.44 - 1.00 mg/dL 9.59  9.58  9.36   Sodium 135 - 145 mmol/L 139  137  137   Potassium 3.5 - 5.1 mmol/L 3.9  3.2  3.8   Chloride 98 - 111 mmol/L 105  104  104   CO2 22 - 32 mmol/L 23  24  23    Calcium  8.9 - 10.3 mg/dL 8.1  8.2  8.1   Total Protein 6.5 - 8.1 g/dL 5.5  5.8    Total Bilirubin 0.0 - 1.2 mg/dL 0.5  0.3    Alkaline Phos 38 - 126 U/L 127  145    AST 15 - 41 U/L 25  22    ALT 0 - 44 U/L 22  24      US  Abdomen Limited RUQ (LIVER/GB) Result Date: 09/03/2023 EXAM: Right Upper Quadrant Abdominal Ultrasound 09/03/2023 04:32:05 AM TECHNIQUE: Real-time ultrasonography of the right upper quadrant of the abdomen was performed. COMPARISON: CT of the abdomen and pelvis 01/08/2018. CLINICAL HISTORY: Elevated liver enzymes. FINDINGS: LIVER: The liver is hyperechoic and somewhat heterogeneous. No discrete lesions  are present. BILIARY SYSTEM: Cholecystectomy. Common bile duct is dilated to 14 mm. RIGHT KIDNEY: The right kidney is grossly unremarkable in appearances without evidence of hydronephrosis, echogenic calculi or worrisome mass lesions. PANCREAS: Visualized portions of the pancreas are unremarkable. OTHER: No right upper quadrant ascites. IMPRESSION: 1. Hepatic steatosis with somewhat heterogeneous echotexture. No discrete lesions. 2. Dilated common bile duct measuring 14 mm. 3. Status post cholecystectomy. Electronically signed by: Lonni Necessary MD 09/03/2023 04:48 AM EDT RP Workstation: [id number]   CT Head Wo Contrast Result Date: 09/02/2023 CLINICAL DATA:  Altered mental status EXAM: CT HEAD WITHOUT CONTRAST TECHNIQUE: Contiguous axial images were obtained from the base of the skull through the vertex without intravenous contrast. RADIATION DOSE REDUCTION: This exam was performed according to the departmental dose-optimization program which includes automated exposure control, adjustment of the mA and/or kV according to patient size and/or use of iterative reconstruction technique. COMPARISON:  05/30/2023 FINDINGS: Brain: Diffuse cerebral and cerebellar atrophy. No acute intracranial abnormality. Specifically, no hemorrhage, hydrocephalus, mass lesion, acute infarction, or significant intracranial injury. Vascular: No hyperdense vessel or unexpected calcification. Skull: No acute calvarial abnormality. Sinuses/Orbits: No acute findings Other: None IMPRESSION: No acute intracranial abnormality. Electronically Signed   By: Franky Crease M.D.   On: 09/02/2023 19:52   DG Chest Port 1 View Result Date: 09/02/2023 CLINICAL DATA:  Possible sepsis EXAM: PORTABLE CHEST 1 VIEW COMPARISON:  05/31/2023 FINDINGS: Cardiac shadow is within normal limits. Lungs are well aerated bilaterally. No focal infiltrate or effusion is seen. No bony abnormality is noted. IMPRESSION: No active disease. Electronically Signed   By:  Oneil Devonshire M.D.   On: 09/02/2023 19:31     Discharge Instructions: Discharge Instructions     Call MD for:  difficulty breathing, headache or visual disturbances   Complete by: As directed    Call MD for:  persistant dizziness or light-headedness   Complete by: As directed    Call MD for:  persistant nausea and vomiting   Complete by: As directed    Call MD for:  temperature >100.4   Complete by: As directed    Diet - low sodium heart healthy   Complete by: As directed    Discharge instructions  Complete by: As directed    Ms. Jana Claros,  It was a pleasure taking care of you at [facility]. You were admitted for urinary tract infection and treated with antibiotics. We are discharging you home now that you are doing better. Please follow the following instructions.   1) Regarding your urinary tract infection, this is cleared.  We have exchanged the Foley. You will not need antibiotics moving forward.   2) Regarding the concern for malignancy in the colon, you can follow-up outpatient with a gastroenterologist if you desire.  Please connect to a gastroenterologist via your primary care physician.  3) If you develop any shortness of breath, chest pains, altered mental status, please return back to the emergency department  4) We have not made any medications changes   Take care,  Dr. Libby Blanch, DO   Increase activity slowly   Complete by: As directed        Signed: Blanch Libby, DO 09/09/2023, 11:13 AM   Internal Medicine Resident PGY-3

## 2024-01-07 DEATH — deceased
